# Patient Record
Sex: Male | Born: 1960 | Race: White | Hispanic: No | Marital: Married | Smoking: Former smoker
Health system: Southern US, Community
[De-identification: ages and names within clinical notes are randomized; demographics above are authoritative.]

## PROBLEM LIST (undated history)

## (undated) DIAGNOSIS — J189 Pneumonia, unspecified organism: Secondary | ICD-10-CM

## (undated) DIAGNOSIS — I219 Acute myocardial infarction, unspecified: Secondary | ICD-10-CM

## (undated) DIAGNOSIS — C801 Malignant (primary) neoplasm, unspecified: Secondary | ICD-10-CM

## (undated) DIAGNOSIS — J84112 Idiopathic pulmonary fibrosis: Secondary | ICD-10-CM

## (undated) DIAGNOSIS — I2510 Atherosclerotic heart disease of native coronary artery without angina pectoris: Secondary | ICD-10-CM

## (undated) DIAGNOSIS — I739 Peripheral vascular disease, unspecified: Secondary | ICD-10-CM

## (undated) DIAGNOSIS — M797 Fibromyalgia: Secondary | ICD-10-CM

## (undated) DIAGNOSIS — E119 Type 2 diabetes mellitus without complications: Secondary | ICD-10-CM

## (undated) DIAGNOSIS — R0600 Dyspnea, unspecified: Secondary | ICD-10-CM

## (undated) DIAGNOSIS — I509 Heart failure, unspecified: Secondary | ICD-10-CM

## (undated) DIAGNOSIS — J449 Chronic obstructive pulmonary disease, unspecified: Secondary | ICD-10-CM

## (undated) DIAGNOSIS — Z87442 Personal history of urinary calculi: Secondary | ICD-10-CM

## (undated) HISTORY — PX: OTHER SURGICAL HISTORY: SHX169

## (undated) HISTORY — PX: WOUND DEBRIDEMENT: SHX247

## (undated) HISTORY — PX: CARDIAC CATHETERIZATION: SHX172

## (undated) HISTORY — PX: APPLICATION OF WOUND VAC: SHX5189

---

## 2012-03-19 DIAGNOSIS — E119 Type 2 diabetes mellitus without complications: Secondary | ICD-10-CM | POA: Insufficient documentation

## 2013-05-14 ENCOUNTER — Ambulatory Visit: Payer: Self-pay | Admitting: Vascular Surgery

## 2013-05-14 LAB — BASIC METABOLIC PANEL
BUN: 11 mg/dL (ref 7–18)
Calcium, Total: 8.8 mg/dL (ref 8.5–10.1)
Chloride: 106 mmol/L (ref 98–107)
Co2: 23 mmol/L (ref 21–32)
Creatinine: 0.69 mg/dL (ref 0.60–1.30)
EGFR (African American): >60
Glucose: 221 mg/dL — ABNORMAL HIGH (ref 65–99)
Osmolality: 276 (ref 275–301)
Sodium: 135 mmol/L — ABNORMAL LOW (ref 136–145)

## 2013-07-25 HISTORY — PX: CORONARY ARTERY BYPASS GRAFT: SHX141

## 2013-10-20 ENCOUNTER — Inpatient Hospital Stay: Payer: Self-pay | Admitting: Specialist

## 2013-10-20 LAB — CBC WITH DIFFERENTIAL/PLATELET
BASOS ABS: 0.2 10*3/uL — AB (ref 0.0–0.1)
Basophil %: 1.1 %
EOS ABS: 0.5 10*3/uL (ref 0.0–0.7)
Eosinophil %: 2.4 %
HCT: 45.9 % (ref 40.0–52.0)
HGB: 14.7 g/dL (ref 13.0–18.0)
LYMPHS ABS: 4.7 10*3/uL — AB (ref 1.0–3.6)
Lymphocyte %: 24.8 %
MCH: 29.5 pg (ref 26.0–34.0)
MCHC: 32.1 g/dL (ref 32.0–36.0)
MCV: 92 fL (ref 80–100)
Monocyte #: 1.7 x10 3/mm — ABNORMAL HIGH (ref 0.2–1.0)
Monocyte %: 9 %
NEUTROS PCT: 62.7 %
Neutrophil #: 11.9 10*3/uL — ABNORMAL HIGH (ref 1.4–6.5)
Platelet: 181 10*3/uL (ref 150–440)
RBC: 4.99 10*6/uL (ref 4.40–5.90)
RDW: 14 % (ref 11.5–14.5)
WBC: 19 10*3/uL — ABNORMAL HIGH (ref 3.8–10.6)

## 2013-10-20 LAB — COMPREHENSIVE METABOLIC PANEL
ALT: 27 U/L (ref 12–78)
Albumin: 3.5 g/dL (ref 3.4–5.0)
Alkaline Phosphatase: 52 U/L
Anion Gap: 9 (ref 7–16)
BILIRUBIN TOTAL: 0.5 mg/dL (ref 0.2–1.0)
BUN: 12 mg/dL (ref 7–18)
CHLORIDE: 103 mmol/L (ref 98–107)
CO2: 24 mmol/L (ref 21–32)
Calcium, Total: 8.2 mg/dL — ABNORMAL LOW (ref 8.5–10.1)
Creatinine: 1.09 mg/dL (ref 0.60–1.30)
EGFR (African American): >60
Glucose: 378 mg/dL — ABNORMAL HIGH (ref 65–99)
Osmolality: 287 (ref 275–301)
Potassium: 3.8 mmol/L (ref 3.5–5.1)
SGOT(AST): 19 U/L (ref 15–37)
Sodium: 136 mmol/L (ref 136–145)
Total Protein: 8 g/dL (ref 6.4–8.2)

## 2013-10-20 LAB — TROPONIN I
TROPONIN-I: 0.7 ng/mL — AB
Troponin-I: 0.5 ng/mL — ABNORMAL HIGH
Troponin-I: 0.63 ng/mL — ABNORMAL HIGH

## 2013-10-20 LAB — APTT: ACTIVATED PTT: 28.6 s (ref 23.6–35.9)

## 2013-10-20 LAB — CK TOTAL AND CKMB (NOT AT ARMC)
CK, Total: 118 U/L
CK-MB: 5 ng/mL — ABNORMAL HIGH (ref 0.5–3.6)

## 2013-10-20 LAB — CK-MB
CK-MB: 5.1 ng/mL — ABNORMAL HIGH (ref 0.5–3.6)
CK-MB: 6.5 ng/mL — ABNORMAL HIGH (ref 0.5–3.6)
CK-MB: 6.1 ng/mL — AB (ref 0.5–3.6)

## 2013-10-20 LAB — PRO B NATRIURETIC PEPTIDE: B-Type Natriuretic Peptide: 1536 pg/mL — ABNORMAL HIGH (ref 0–125)

## 2013-10-21 LAB — CBC WITH DIFFERENTIAL/PLATELET
Basophil #: 0 10*3/uL (ref 0.0–0.1)
Basophil %: 0.1 %
Eosinophil #: 0 10*3/uL (ref 0.0–0.7)
Eosinophil %: 0 %
HCT: 38.1 % — AB (ref 40.0–52.0)
HGB: 12.6 g/dL — ABNORMAL LOW (ref 13.0–18.0)
LYMPHS PCT: 6.8 %
Lymphocyte #: 1.1 10*3/uL (ref 1.0–3.6)
MCH: 29.6 pg (ref 26.0–34.0)
MCHC: 33.1 g/dL (ref 32.0–36.0)
MCV: 90 fL (ref 80–100)
MONO ABS: 1 x10 3/mm (ref 0.2–1.0)
Monocyte %: 6.1 %
NEUTROS ABS: 14.7 10*3/uL — AB (ref 1.4–6.5)
NEUTROS PCT: 87 %
PLATELETS: 121 10*3/uL — AB (ref 150–440)
RBC: 4.25 10*6/uL — ABNORMAL LOW (ref 4.40–5.90)
RDW: 13.7 % (ref 11.5–14.5)
WBC: 16.8 10*3/uL — ABNORMAL HIGH (ref 3.8–10.6)

## 2013-10-21 LAB — BASIC METABOLIC PANEL
Anion Gap: 6 — ABNORMAL LOW (ref 7–16)
BUN: 30 mg/dL — ABNORMAL HIGH (ref 7–18)
CO2: 26 mmol/L (ref 21–32)
CREATININE: 1.26 mg/dL (ref 0.60–1.30)
Calcium, Total: 7.8 mg/dL — ABNORMAL LOW (ref 8.5–10.1)
Chloride: 100 mmol/L (ref 98–107)
EGFR (Non-African Amer.): >60
Glucose: 318 mg/dL — ABNORMAL HIGH (ref 65–99)
Osmolality: 283 (ref 275–301)
Potassium: 4.1 mmol/L (ref 3.5–5.1)
Sodium: 132 mmol/L — ABNORMAL LOW (ref 136–145)

## 2013-10-21 LAB — PRO B NATRIURETIC PEPTIDE: B-TYPE NATIURETIC PEPTID: 1923 pg/mL — AB (ref 0–125)

## 2013-10-22 LAB — CBC WITH DIFFERENTIAL/PLATELET
BASOS PCT: 0.1 %
Basophil #: 0 10*3/uL (ref 0.0–0.1)
EOS ABS: 0 10*3/uL (ref 0.0–0.7)
EOS PCT: 0 %
HCT: 38.8 % — AB (ref 40.0–52.0)
HGB: 12.7 g/dL — ABNORMAL LOW (ref 13.0–18.0)
LYMPHS PCT: 7.7 %
Lymphocyte #: 1 10*3/uL (ref 1.0–3.6)
MCH: 29.5 pg (ref 26.0–34.0)
MCHC: 32.7 g/dL (ref 32.0–36.0)
MCV: 90 fL (ref 80–100)
MONO ABS: 0.6 x10 3/mm (ref 0.2–1.0)
MONOS PCT: 4.3 %
Neutrophil #: 11.9 10*3/uL — ABNORMAL HIGH (ref 1.4–6.5)
Neutrophil %: 87.9 %
Platelet: 125 10*3/uL — ABNORMAL LOW (ref 150–440)
RBC: 4.3 10*6/uL — ABNORMAL LOW (ref 4.40–5.90)
RDW: 14 % (ref 11.5–14.5)
WBC: 13.5 10*3/uL — ABNORMAL HIGH (ref 3.8–10.6)

## 2013-10-22 LAB — BASIC METABOLIC PANEL
ANION GAP: 5 — AB (ref 7–16)
BUN: 32 mg/dL — ABNORMAL HIGH (ref 7–18)
CHLORIDE: 101 mmol/L (ref 98–107)
Calcium, Total: 8 mg/dL — ABNORMAL LOW (ref 8.5–10.1)
Co2: 28 mmol/L (ref 21–32)
Creatinine: 1.18 mg/dL (ref 0.60–1.30)
EGFR (African American): >60
EGFR (Non-African Amer.): >60
Glucose: 300 mg/dL — ABNORMAL HIGH (ref 65–99)
Osmolality: 286 (ref 275–301)
Potassium: 4.5 mmol/L (ref 3.5–5.1)
Sodium: 134 mmol/L — ABNORMAL LOW (ref 136–145)

## 2013-10-25 LAB — CULTURE, BLOOD (SINGLE)

## 2013-11-22 DIAGNOSIS — I2510 Atherosclerotic heart disease of native coronary artery without angina pectoris: Secondary | ICD-10-CM | POA: Insufficient documentation

## 2013-12-10 DIAGNOSIS — Z951 Presence of aortocoronary bypass graft: Secondary | ICD-10-CM | POA: Insufficient documentation

## 2013-12-14 DIAGNOSIS — I509 Heart failure, unspecified: Secondary | ICD-10-CM | POA: Insufficient documentation

## 2013-12-14 DIAGNOSIS — J449 Chronic obstructive pulmonary disease, unspecified: Secondary | ICD-10-CM | POA: Insufficient documentation

## 2014-01-31 DIAGNOSIS — E669 Obesity, unspecified: Secondary | ICD-10-CM | POA: Insufficient documentation

## 2014-02-03 DIAGNOSIS — R768 Other specified abnormal immunological findings in serum: Secondary | ICD-10-CM | POA: Insufficient documentation

## 2014-11-14 NOTE — Op Note (Signed)
PATIENT NAME:  [name], [name] MR#:  [id number] DATE OF BIRTH:  July 20, 1961  DATE OF PROCEDURE:  05/14/2013  PREOPERATIVE DIAGNOSIS: Atherosclerotic occlusive disease, bilateral lower extremities, with life-style-limiting claudication.   POSTOPERATIVE DIAGNOSIS: Atherosclerotic occlusive disease, bilateral lower extremities, with life-style-limiting claudication.   PROCEDURE:  1.  Abdominal aortogram.  2.  Right lower extremity runoff.  3.  Percutaneous transluminal angioplasty, to 5 mm, right common femoral artery, using Lutonix balloon.  4.  Percutaneous transluminal angioplasty and stent placement, right common iliac artery, to 7 mm.  5.  Percutaneous transluminal angioplasty and stent placement, left external iliac artery, to 6 mm.   SURGEON: Katha Cabal, MD.  SEDATION: Versed 5 mg plus fentanyl 200 mcg administered IV. Continuous ECG, pulse oximetry and cardiopulmonary monitoring was performed throughout the entire procedure by the interventional radiology nurse.  TOTAL SEDATION TIME: Is 1 hour 20 minutes.   ACCESS: A 6-French sheath, left common femoral artery.   FLUOROSCOPY TIME: Was 9 minutes.   CONTRAST USED: Isovue 134 mL.   INDICATIONS: Mr. 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 He is still working. His mobility has become an increasing problem, and he is seeking treatment. The risks and benefits for angiography with the hope for intervention were reviewed. All questions were answered. The patient has agreed to proceed.   PROCEDURE: The patient was taken to special procedures and placed in the supine position and, after adequate sedation had been achieved, both groins were prepped and draped in sterile fashion. Then 1% lidocaine was infiltrated into soft tissues in the left. Ultrasound was placed in a sterile sleeve. Ultrasound was utilized  secondary to lack of appropriate landmarks and to avoid vascular injury.   Under ultrasound visualization, the common femoral artery is identified. It is echolucent and pulsatile, indicating patency. Images were recorded for the permanent record and access to the common femoral artery was obtained with a micropuncture needle, microwire followed by microsheath, J-wire followed by a 5-French sheath. K-wire will not negotiate through the iliac system and a Magic Torque wire is selected after hand-injected contrast demonstrates a greater than 90% stenosis in the proximal external iliac artery. Magic Torque wire was then negotiated through the lesion and up into the aorta and subsequently 3000 units of heparin is given and allowed to circulate for several minutes. A 5 x 4 balloon is advanced across the lesion and subsequently an inflation is made to 10 atmospheres for several minutes.   Follow-up angiography demonstrates significant residual stenosis but an improvement. Therefore, balloon was removed and the pigtail catheter was advanced over the wire and positioned at the level of T12. AP projection of the aorta was obtained. The pigtail catheter was repositioned and bilateral oblique views of the pelvis are obtained.   Using a stiff-angled glide wire and the pigtail catheter, the aortic bifurcation was crossed and the pigtail catheter was advanced down to the external iliac where RAO projection of the right groin was obtained. Subsequently, the pigtail catheter was advanced into the profunda and distal runoff is imaged down to the trifurcation.   Amplatz Super Stiff wire is then advanced and a Balkan sheath is advanced up and over a 6-French. As noted, 3000 units of heparin had been given.   The Amplatz wire was exchanged for a Magic Torque wire. A 4 x 4 balloon is advanced across the lesion, which is located  within the distal external and short distance of the proximal common femoral. Angioplasty was  performed to 10 atmospheres for 3 minutes. Follow-up angiography demonstrates a smooth angioplasty site; however, there is greater than 50% residual stenosis and therefore a 5 x 60 Lutonix balloon is advanced across the lesion. It is inflated to 14 atmospheres and left inflated for 3 minutes.   Follow-up angiography demonstrates that there is approximately a 15% to 20% residual stenosis, significant improvement, and it is completely smooth and free of any dissections and appears to be more than adequate.   Attention is then turned to the more proximal iliac and an LAO projection in magnified view is  used to demonstrate the iliac stenosis through the Balkan sheath. This lesion is angioplastied first to 6 mm and subsequently an 8 x 60 Lifestar stent is deployed across it and is post dilated to 7 mm. Following the 7 mm dilatation, there is less than 10% residual stenosis.   The sheath is then pulled back into the external iliac on the left. RAO projections obtained. Subsequently a 7 x 60 Lifestar stent is deployed across the lesion in the left external iliac. It is  post dilated with a 6 mm balloon and the result is excellent.   Oblique view of the left groin was obtained. Subsequently, a Mynx device is deployed with good result. There are no immediate complications.   INTERPRETATION: The abdominal aorta is opacified with a bolus injection of contrast. There are single renal arteries, no evidence for renal artery stenosis, and the nephrograms are normal size. Distally within the aorta there is diffuse disease but it does not appear to be hemodynamically significant.   Within the right common iliac artery, there is greater than 70% stenosis. Within the right distal external and proximal common femoral, there is a greater than 80% stenosis. SFA has diffuse disease distally, profunda is widely patent.   The left common iliac is patent but the left external iliac demonstrated a string sign initially.  Following angioplasty there was an inadequate result. There is diffuse 50% stenosis in the left common femoral. Profunda femoris appears patent. SFA appears occluded.   Following angioplasty to a maximum dimension of 5 mm with the Lutonix balloon, the common femoral, external iliac lesion on the right is well treated following angioplasty and stent placement with a maximal dilatation to 7 mm. The right common iliac artery lesion is well treated and, following angioplasty and stent placement with maximal dilatation to 6 mm, the left external iliac artery is well treated.   SUMMARY: Successful treatment of aortoiliac disease as described above.   ____________________________ Katha Cabal, MD ggs:np D: 05/14/2013 13:57:21 ET T: 05/14/2013 14:48:43 ET JOB#: [id number]  cc: Katha Cabal, MD, <Dictator> Dory Horn. Eliberto Ivory, Eugene MD ELECTRONICALLY SIGNED 05/31/2013 8:14

## 2014-11-15 NOTE — Consult Note (Signed)
   Present Illness 54 yo male with history of pvd s/p pci of iliacs and carotids who presented to the hosptal with increasing shortness of breath and chest tightness. He has ruled in for a nstemi with a serum troponin of 0.67. He has risk facotrs including tobacco abuse, diaabetes, hyperlipidemia, hypertension and family history of cad. He still has some shortness of breath and chest pain. Echo was very difficult to assess due to very poor sound wave transfmission. EF doe not appear to be normal but at least mildly reduced at 40-45%.   Physical Exam:  GEN well developed, obese   HEENT PERRL, hearing intact to voice   NECK supple  No masses   RESP no use of accessory muscles  rhonchi  crackles   CARD Regular rate and rhythm  Normal, S1, S2  Murmur   Murmur Systolic   Systolic Murmur axilla   ABD denies tenderness  no hernia  normal BS  no Adominal Mass   EXTR negative cyanosis/clubbing, negative edema   SKIN normal to palpation   NEURO cranial nerves intact, motor/sensory function intact   PSYCH A+O to time, place, person   Review of Systems:  Subjective/Chief Complaint shortness of breath   General: No Complaints   Skin: No Complaints   ENT: No Complaints   Eyes: No Complaints   Neck: No Complaints   Respiratory: Short of breath  Wheezing   Cardiovascular: Chest pain or discomfort  Dyspnea  Orthopnea   Gastrointestinal: No Complaints   Genitourinary: No Complaints   Vascular: No Complaints   Musculoskeletal: No Complaints   Neurologic: No Complaints   Hematologic: No Complaints   Endocrine: No Complaints   Psychiatric: No Complaints   Review of Systems: All other systems were reviewed and found to be negative   Medications/Allergies Reviewed Medications/Allergies reviewed   EKG:  EKG NSR   Abnormal NSSTTW changes    No Known Allergies:    Impression 54 yo male with history of cad s/p stent placement in the iliacs and carotids. Presented with  chest pain and sob. EKG unremarkable for signficant ischemia. Ruled in for nstemi wiht troponin elevation to 0.68. He is stable. Risk facotrs include diabetes melitus, hypertension and hyperlipidemia. Echo was difficult to assess due to poor images but appears to be mildly reduced. Given his severe vascular disease and multiple risk factors, will need to assess for evidence of cad with cardiac cath due to rest pain and elevated cardiac markers. Risk and benefits were discussed with patient and multiple family members. He agrees to proceed. Will discuss access approach with vascular surgery prior to cath   Plan 1. Continue to rule out for mi. 2. Continue current meds 3. Proceed with cardiac cath in am 4. Further recs pending course   Electronic Signatures: Teodoro Spray (MD)  (Signed 29-Mar-15 15:17)  Authored: General Aspect/Present Illness, History and Physical Exam, Review of System, EKG , Allergies, Impression/Plan   Last Updated: 29-Mar-15 15:17 by Teodoro Spray (MD)

## 2014-11-15 NOTE — H&P (Signed)
PATIENT NAME:  [name], [name] MR#:  [id number] DATE OF BIRTH:  April 23, 1961  DATE OF ADMISSION:  10/20/2013  REFERRING PHYSICIAN:  Dr. Lurline Hare.   PRIMARY CARE PHYSICIAN:  Dr. Calla Kicks.   CHIEF COMPLAINT:  Shortness of breath.   HISTORY OF PRESENT ILLNESS:  This is a 54 year old male with known history of tobacco abuse, peripheral vascular disease, diabetes mellitus, hyperlipidemia, presents with complaints of shortness of breath, the patient reports in general he has had shortness of breath for a few months, but reported has much worsened over the last couple of days, as well he reports some cough, denies any fever, any chills, the patient called EMS today, when they presented he was saturating in the 70s on room air, the patient requiring BiPAP in the ED, the patient's chest x-ray showed COPD picture, as well shows opacity, possible pulmonary edema versus pneumonia, the patient had leukocytosis at 19,000, he is afebrile, the patient's symptoms improved after receiving IV Lasix and IV Solu-Medrol and nebulizer treatment, as he had significant wheezing, on presentation, he denies any history of COPD, but he reports history of heavy smoking in the past, was never officially worked up for COPD, the patient's BNP was found to be elevated at 1500, the patient's troponin were found to be elevated at 0.5, he denies any chest pain, the patient was given aspirin and given one dose Lovenox in the ED until acute coronary syndrome is ruled out, the patient is known to have history of peripheral vascular disease, status post stenting by Dr. Delana Meyer.  The patient denies any coffee-ground emesis, any bright red blood per rectum or any melena, any nausea, any vomiting, any chest pain.   PAST MEDICAL HISTORY: 1.  Diabetes mellitus.  2.  Peripheral vascular disease.  3.  Hyperlipidemia.  4.  Peripheral vascular disease.  5.  Tobacco abuse.   PAST SURGICAL HISTORY:  Status post stenting of lower extremity due to  peripheral vascular disease in the past.   SOCIAL HISTORY:  The patient smokes up to 2 packs per day.  No alcohol.  No illicit drug use.   FAMILY HISTORY:  Significant for MI in his mother at the age of 37.   ALLERGIES:  No known drug allergies.   HOME MEDICATIONS: 1.  Aspirin 81 mg oral daily.  2.  Gabapentin 300 mg oral at bedtime.  3.  Glipizide 10 mg oral daily.  4.  Humulin 70/30 25 to 35 units twice daily.  5.  Metformin 1 gram oral 2 times a day.  6.  Pravastatin 40 mg at bedtime.  7.  Plavix 75 mg daily.  8.  Albuterol as needed.   REVIEW OF SYSTEMS:  CONSTITUTIONAL:  Denies fever, chills.  Complains of fatigue, weakness.  Denies weight gain, weight loss.  EYES:  Denies blurry vision, double vision, inflammation, glaucoma.  EARS, NOSE, THROAT:  Denies tinnitus, ear pain, hearing loss, epistaxis.  RESPIRATORY:  Reports cough, productive sputum, shortness of breath.  Denies any hemoptysis.  CARDIOVASCULAR:  Denies chest pain, syncope.  Reports some palpitations.  Has chronic lower extremity edema.  GASTROINTESTINAL:  Denies nausea, vomiting, diarrhea, abdominal pain, hematemesis, melena.  GENITOURINARY:  Denies dysuria, hematuria, renal colic.  ENDOCRINE:  Denies polyuria, polydipsia, heat or cold intolerance.  HEMATOLOGY:  Denies anemia, easy bruising, bleeding diathesis.  INTEGUMENTARY:  Denies acne, rash or skin lesions.  MUSCULOSKELETAL:  Denies any arthritis, cramps or gout.  NEUROLOGIC:  Denies CVA, TIA, tremors, vertigo.  PSYCHIATRIC:  Denies anxiety, insomnia or depression, substance abuse, alcohol abuse.   PHYSICAL EXAMINATION: VITAL SIGNS:  Temperature 96.8, pulse 104, respiratory rate 19, blood pressure 138/68, saturating 97% on BiPAP.  GENERAL:  Obese male, lies in mild respiratory distress on BiPAP.  HEENT:  Head atraumatic, normocephalic.  Pupils equal, reactive to light.  Pink conjunctivae.  Anicteric sclerae.  Moist oral mucosa.  NECK:  Supple.  No  thyromegaly.  No JVD.  CHEST:  The patient had fair air entry bilaterally with basilar crackles and scattered wheezing.  CARDIOVASCULAR:  S1, S2 heard.  No rubs, murmur or gallops.  ABDOMEN:  Soft, nontender, nondistended.  Bowel sounds present.  EXTREMITIES:  No edema.  No clubbing.  No cyanosis.  Pedal pulses were present, but diminished, radial pulses symmetrical bilaterally.  PSYCHIATRIC:  Appropriate affect.  Awake, alert x 3.  Intact judgment and insight.  NEUROLOGIC:  Cranial nerves grossly intact.  Motor five out of five.  No focal deficits.  SKIN:  Warm and dry.  No focal deficits.  MUSCULOSKELETAL:  No joint effusion or erythema.  LYMPHATIC:  No cervical lymphadenopathy.   PERTINENT LABORATORY DATA:  BNP 1536, glucose 378, BUN 12, creatinine 1.09, sodium 136, potassium 3.8, chloride 103, ALT 27, AST 19, alk phos 52, total CK 118.  Troponin 0.50, white blood cell 19, hemoglobin 14.7, hematocrit 45.9, platelets 181.  ABG showing pH of 7.24, pCO2 51, pO2 of 416.  Chest x-ray showing COPD picture and interstitial opacities which could be congestive or infectious.  ASSESSMENT AND PLAN: 1.  Acute respiratory failure, this appears to be multifactorial, most likely related to chronic obstructive pulmonary exacerbation, and congestive heart failure, of new onset, and possible pneumonia on the chest x-ray, the patient currently on BiPAP, but I would anticipate he will be off BiPAP very soon, we will keep him on as needed oxygen.  2.  Chronic obstructive pulmonary disease exacerbation, the patient has no official diagnosis, but apparently has chronic obstructive pulmonary disease as it is even evident on his chest x-ray, the patient will be started on intravenous Solu-Medrol and DuoNebs every four hours and as needed nebulizer.  As well, we will check echocardiogram for the patient, we will continue to cycle the cardiac enzymes. 3.  Pneumonia:  The patient has significant leukocytosis with opacity on  the chest x-ray, will be started on levofloxacin.  4.  Elevated troponin:  This is most likely demand ischemia, but he will be given 324 mg of aspirin.  He will be given one dose of Lovenox treatment dose until we cycle his cardiac enzymes and rule out acute coronary syndrome, we will consult cardiology service.  We will check 2-D echocardiogram as well.  5.  Peripheral vascular disease.  We will continue the patient on aspirin and Plavix.  6.  Hyperlipidemia.  Continue with statin.  7.  Diabetes mellitus, uncontrolled:  We will hold hypoglycemic agents.  We will keep patient on insulin sliding scale.  8.  Tobacco abuse:  The patient was counseled.  Will be started on NicoDerm patch.  9.  Deep vein thrombosis prophylaxis.  The patient received one dose Lovenox for anticoagulation.  We will await cardiology recommendation prior to decide on to continue treatment versus prophylaxis dose.  10.  CODE STATUS:  DISCUSSED WITH THE PATIENT, CURRENTLY HE WISHES TO BE FULL CODE, BUT HE DOES NOT WISH TO REMAIN ON VENT SUPPORT FOR MORE THAN TWO DAYS IF HE REQUIRES INTUBATION.   Total time spent on admission  and patient care 55 minutes.     ____________________________ Albertine Patricia, MD dse:ea D: 10/20/2013 05:22:52 ET T: 10/20/2013 06:04:15 ET JOB#: [id number]  cc: Albertine Patricia, MD, <Dictator> Adelis Docter Graciela Husbands MD ELECTRONICALLY SIGNED 10/21/2013 0:10

## 2014-11-15 NOTE — Discharge Summary (Signed)
PATIENT NAME:  [name], WEBER MR#:  [id number] DATE OF BIRTH:  [birth year]  DATE OF ADMISSION:  10/20/2013 DATE OF DISCHARGE:  10/22/2013  For detailed note, please take a look at the history and physical done on admission by Dr. Waldron Labs.   DIAGNOSES AT DISCHARGE:  As follows:  Acute respiratory failure, likely secondary to a combination of chronic obstructive pulmonary disease and congestive heart failure. Chronic obstructive pulmonary disease exacerbation due to ongoing tobacco abuse. Congestive heart failure, likely acute systolic dysfunction. Cardiomyopathy, ejection fraction of 20%, likely ischemic cardiomyopathy. Diabetes, peripheral vascular disease, hypertension, hyperlipidemia, ongoing tobacco abuse.   DIET:  The patient is being discharged on a low-sodium, low-fat, American diabetic Association diet.   ACTIVITY: As tolerated.   FOLLOWUP: With Dr. Jordan Hawks next 1 to 2 weeks.   DISCHARGE MEDICATIONS: Gabapentin 300 mg at bedtime, metformin 1000 mg b.i.d., glipizide XL 10 mg daily, Pravachol 40 mg daily, Humulin 70/30, 25 units b.i.d., aspirin 81 mg daily, albuterol inhaler 2 puffs 4 times daily as needed, Advair 250/50 one puff b.i.d., Spiriva 1 puff daily, Lasix 40 mg daily, Levaquin 5 mg daily x 5 days, prednisone taper starting at 60 mg down to 10 mg over the next 6 days, lisinopril 5 mg daily, Coreg 3.125 mg b.i.d. and potassium 10 mEq daily.   CONSULTANTS DURING HOSPITAL COURSE: Dr. Jordan Hawks from cardiology.   PERTINENT STUDIES DONE DURING THE HOSPITAL COURSE: As follows: Chest x-ray done on admission showing COPD with interstitial opacities that could be congestive or infectious. A 2-dimensional echocardiogram done showing ejection fraction of 35% to 40%, moderately decreased global LV systolic function, mildly dilated left atrium. A cardiac catheterization done showing moderate to severe left-sided failure, EF of 20%, significant 3-vessel coronary artery disease.   HOSPITAL  COURSE: This is a 54 year old male with medical problems as mentioned above, presented to the hospital with shortness of breath and noted to be in acute respiratory failure.  1.  Acute respiratory failure. This was likely the diagnosis at admission as the patient presented with shortness of breath, tachycardia, using his accessory muscles on breathing on admission. This was initially thought to be secondary to COPD along with some mild CHF.  Therefore, the patient was treated for both. He was started on IV diuresis for his CHF and also started on IV steroids, around-the-clock nebulizer treatments and empiric antibiotics for his COPD exacerbation. The patient has clinically improved significantly since admission. He is about 3 liters negative after diuresis. He has also been tapered off IV steroids and onto oral prednisone and his wheezing and bronchospasm has improved. He has been ambulated on room air, did not desaturate below 90% and, therefore, does not qualify for home oxygen. At this point, the patient is being discharged on oral prednisone taper and maintenance inhalers for his COPD and also some maintenance Lasix for his underlying CHF.   2.  COPD exacerbation. This was likely secondary to ongoing tobacco abuse and possible underlying acute bronchitis. The patient was aggressively treated with IV steroids, around-the-clock nebulizer treatments, also maintained on empiric antibiotics with Levaquin. I also did start him on maintenance Advair and Spiriva, which he is currently being discharged on too. His bronchospasm and his wheezing improved. He is being discharged on oral prednisone taper along with his maintenance inhalers. He did not qualify for home oxygen.  3.  CHF.  This is likely acute systolic CHF.  The patient had an echocardiogram that showed EF of 35% to 40%, also  had a cardiac cath which showed an EF of 20%. Clinically, he is not in CHF presently. He has been diuresed adequately with IV Lasix.  He is currently being discharged on oral Lasix, ACE inhibitor and beta blockers as stated. He will have close followup with cardiology as an outpatient.  4.  Elevated troponin. This was likely in the setting of demand ischemia. There was no evidence of acute coronary syndrome. The patient had no acute chest pain.  His cardiac markers were followed serially, they did not trend up. His echocardiogram showed severe LV dysfunction. Therefore, he underwent a cardiac catheterization which did show a severe ischemic cardiomyopathy, ejection fraction of 20% along with significant 3-vessel coronary artery disease. At this point, cardiology wants to manage the patient medically and repeat his echocardiogram in a few weeks. Otherwise, refer him to possible coronary artery bypass graft surgery as an outpatient.  5.  Diabetes. The patient's blood sugars remained stable. He will continue his metformin, Humulin 70/30 as an outpatient.  6.  Hyperlipidemia. The patient was maintained on his Pravachol and will resume that.  7.  Diabetic neuropathy. The patient was maintained on his Neurontin. He will continue that.  8.  Tobacco abuse. The patient was strongly advised to quit smoking. He was maintained on nicotine patch while in the hospital.  9.  Ischemic cardiomyopathy, ejection fraction of 20%. This was as per the cardiac catheterization done on March 30th. At this point, cardiology wants to manage this medically with beta blockers, ACE inhibitors and continue his aspirin and statin. He will have close followup with cardiology as an outpatient. He may need referral for possible coronary artery bypass graft surgery but this is to be done by Dr. Ubaldo Glassing, his cardiologist, as an outpatient.  10.  CODE STATUS: The patient is a full code.   DISPOSITION: He is being discharged home.   TIME SPENT: 45 minutes.   ____________________________ Belia Heman. Verdell Carmine, MD vjs:cs D: 10/22/2013 15:12:35 ET T: 10/22/2013 19:53:57  ET JOB#: [id number]  cc: Belia Heman. Verdell Carmine, MD, <Dictator> Javier Docker. Ubaldo Glassing, MD Henreitta Leber MD ELECTRONICALLY SIGNED 10/23/2013 14:52

## 2015-01-02 ENCOUNTER — Other Ambulatory Visit: Payer: Self-pay | Admitting: Specialist

## 2015-01-02 DIAGNOSIS — R918 Other nonspecific abnormal finding of lung field: Secondary | ICD-10-CM

## 2015-03-30 ENCOUNTER — Encounter: Payer: Self-pay | Admitting: *Deleted

## 2015-03-30 ENCOUNTER — Emergency Department: Payer: No Typology Code available for payment source

## 2015-03-30 DIAGNOSIS — Y998 Other external cause status: Secondary | ICD-10-CM | POA: Diagnosis not present

## 2015-03-30 DIAGNOSIS — W312XXA Contact with powered woodworking and forming machines, initial encounter: Secondary | ICD-10-CM | POA: Diagnosis not present

## 2015-03-30 DIAGNOSIS — Y9389 Activity, other specified: Secondary | ICD-10-CM | POA: Insufficient documentation

## 2015-03-30 DIAGNOSIS — E119 Type 2 diabetes mellitus without complications: Secondary | ICD-10-CM | POA: Diagnosis not present

## 2015-03-30 DIAGNOSIS — S81811A Laceration without foreign body, right lower leg, initial encounter: Secondary | ICD-10-CM | POA: Diagnosis not present

## 2015-03-30 DIAGNOSIS — Y92002 Bathroom of unspecified non-institutional (private) residence single-family (private) house as the place of occurrence of the external cause: Secondary | ICD-10-CM | POA: Diagnosis not present

## 2015-03-30 NOTE — ED Notes (Signed)
Patient states he was remodeling a bathroom and tripped over a saw. Patient has laceration to right lower leg. Dressing is bloody at site.

## 2015-03-31 ENCOUNTER — Emergency Department
Admission: EM | Admit: 2015-03-31 | Discharge: 2015-03-31 | Discharge status: Against Medical Advice | Payer: No Typology Code available for payment source | Attending: Emergency Medicine | Admitting: Emergency Medicine

## 2015-03-31 HISTORY — DX: Type 2 diabetes mellitus without complications: E11.9

## 2015-03-31 HISTORY — DX: Acute myocardial infarction, unspecified: I21.9

## 2015-05-12 ENCOUNTER — Encounter: Payer: No Typology Code available for payment source | Attending: Surgery | Admitting: Surgery

## 2015-05-12 DIAGNOSIS — E11622 Type 2 diabetes mellitus with other skin ulcer: Secondary | ICD-10-CM | POA: Insufficient documentation

## 2015-05-12 DIAGNOSIS — I70532 Atherosclerosis of nonautologous biological bypass graft(s) of the right leg with ulceration of calf: Secondary | ICD-10-CM | POA: Insufficient documentation

## 2015-05-12 DIAGNOSIS — J449 Chronic obstructive pulmonary disease, unspecified: Secondary | ICD-10-CM | POA: Diagnosis not present

## 2015-05-12 DIAGNOSIS — I739 Peripheral vascular disease, unspecified: Secondary | ICD-10-CM | POA: Insufficient documentation

## 2015-05-12 DIAGNOSIS — I509 Heart failure, unspecified: Secondary | ICD-10-CM | POA: Diagnosis not present

## 2015-05-12 DIAGNOSIS — Z87891 Personal history of nicotine dependence: Secondary | ICD-10-CM | POA: Diagnosis not present

## 2015-05-12 DIAGNOSIS — I2510 Atherosclerotic heart disease of native coronary artery without angina pectoris: Secondary | ICD-10-CM | POA: Insufficient documentation

## 2015-05-12 DIAGNOSIS — X58XXXA Exposure to other specified factors, initial encounter: Secondary | ICD-10-CM | POA: Diagnosis not present

## 2015-05-12 DIAGNOSIS — S81811A Laceration without foreign body, right lower leg, initial encounter: Secondary | ICD-10-CM | POA: Insufficient documentation

## 2015-05-13 NOTE — Progress Notes (Signed)
John, [name] ([id number]) Visit Report for 05/12/2015 Allergy List Details Patient Name: [name], [name]. Date of Service: 05/12/2015 2:00 PM Medical Record Number: [id number] Patient Account Number: [id number] Date of Birth/Sex: 1961/01/14 (54 y.o. Male) Treating RN: [name] Primary Care Physician: [name] Other Clinician: Referring Physician: Rutherford [name] Treating Physician/Extender: [name] in Treatment: 0 Allergies Active Allergies No Known Drug Allergies Allergy Notes Electronic Signature(s) Signed: 05/12/2015 5:34:28 PM By: [name] Entered By: John Cool, RN, [name], John on 05/12/2015 14:17:42 [name] ([id number]) -------------------------------------------------------------------------------- Arrival Information Details Patient Name: [name] Date of Service: 05/12/2015 2:00 PM Medical Record Number: [id number] Patient Account Number: [id number] Date of Birth/Sex: 12/04/1960 (54 y.o. Male) Treating RN: [name] Primary Care Physician: [name] Other Clinician: Referring Physician: Rutherford [name] Treating Physician/Extender: [name] in Treatment: 0 Visit Information Patient Arrived: Ambulatory Arrival Time: 14:08 Accompanied By: self Transfer Assistance: Manual Patient Identification Verified: Yes Secondary Verification Process Yes Completed: Patient Requires Transmission- No Based Precautions: Patient Has Alerts: Yes Patient Alerts: Patient on Blood Thinner Type II Diabetic Electronic Signature(s) Signed: 05/12/2015 5:34:28 PM By: [name] Entered By: John Cool, RN, [name], John on 05/12/2015 14:12:16 [name] ([id number]) -------------------------------------------------------------------------------- Clinic Level of Care Assessment Details Patient Name: [name]. Date of Service: 05/12/2015 2:00 PM Medical Record Number: [id number] Patient  Account Number: [id number] Date of Birth/Sex: February 22, 1961 (54 y.o. Male) Treating RN: [name] Primary Care Physician: [name] Other Clinician: Referring Physician: Rutherford [name] Treating Physician/Extender: [name] in Treatment: 0 Clinic Level of Care Assessment Items TOOL 1 Quantity Score []  - Use when EandM and Procedure is performed on INITIAL visit 0 ASSESSMENTS - Nursing Assessment / Reassessment X - General Physical Exam (combine w/ comprehensive assessment (listed just 1 20 below) when performed on new pt. evals) X - Comprehensive Assessment (HX, ROS, Risk Assessments, Wounds Hx, etc.) 1 25 ASSESSMENTS - Wound and Skin Assessment / Reassessment []  - Dermatologic / Skin Assessment (not related to wound area) 0 ASSESSMENTS - Ostomy and/or Continence Assessment and Care []  - Incontinence Assessment and Management 0 []  - Ostomy Care Assessment and Management (repouching, etc.) 0 PROCESS - Coordination of Care X - Simple Patient / Family Education for ongoing care 1 15 []  - Complex (extensive) Patient / Family Education for ongoing care 0 []  - Staff obtains Programmer, systems, Records, Test Results / Process Orders 0 []  - Staff telephones HHA, Nursing Homes / Clarify orders / etc 0 []  - Routine Transfer to another Facility (non-emergent condition) 0 []  - Routine Hospital Admission (non-emergent condition) 0 X - New Admissions / Biomedical engineer / Ordering NPWT, Apligraf, etc. 1 15 []  - Emergency Hospital Admission (emergent condition) 0 PROCESS - Special Needs []  - Pediatric / Minor Patient Management 0 []  - Isolation Patient Management 0 [name] ([id number]) []  - Hearing / Language / Visual special needs 0 []  - Assessment of Community assistance (transportation, D/C planning, etc.) 0 []  - Additional assistance / Altered mentation 0 []  - Support Surface(s) Assessment (bed, cushion, seat, etc.) 0 INTERVENTIONS - Miscellaneous []  - External ear  exam 0 []  - Patient Transfer (multiple staff / Civil Service fast streamer / Similar devices) 0 []  - Simple Staple / Suture removal (25 or less) 0 []  - Complex Staple / Suture removal (26 or more) 0 []  - Hypo/Hyperglycemic Management (do not check if billed separately) 0 X - Ankle / Brachial Index (ABI) - do  not check if billed separately 1 15 Has the patient been seen at the hospital within the last three years: Yes Total Score: 90 Level Of Care: New/Established - Level 3 Electronic Signature(s) Signed: 05/12/2015 5:34:28 PM By: [name] Entered By: John Cool, RN, [name], John on 05/12/2015 15:20:33 [name] ([id number]) -------------------------------------------------------------------------------- Encounter Discharge Information Details Patient Name: [name]. Date of Service: 05/12/2015 2:00 PM Medical Record Number: [id number] Patient Account Number: [id number] Date of Birth/Sex: 05-25-1961 (54 y.o. Male) Treating RN: [name] Primary Care Physician: [name] Other Clinician: Referring Physician: Rutherford [name] Treating Physician/Extender: [name] in Treatment: 0 Encounter Discharge Information Items Discharge Pain Level: 0 Discharge Condition: Stable Ambulatory Status: Ambulatory Discharge Destination: Home Transportation: Private Auto Accompanied By: self Schedule Follow-up Appointment: Yes Medication Reconciliation completed and provided to Patient/Care Yes [name]: Provided on Clinical Summary of Care: 05/12/2015 Form Type Recipient Paper Patient [employer] Electronic Signature(s) Signed: 05/12/2015 3:33:31 PM By: [name] Entered By: [name] on 05/12/2015 15:33:31 Krizan, [name] ([id number]) -------------------------------------------------------------------------------- Lower Extremity Assessment Details Patient Name: [name]. Date of Service: 05/12/2015 2:00 PM Medical Record Number: [id number] Patient  Account Number: [id number] Date of Birth/Sex: 08/23/1960 (54 y.o. Male) Treating RN: [name] Primary Care Physician: [name] Other Clinician: Referring Physician: Rutherford [name] Treating Physician/Extender: [name] in Treatment: 0 Edema Assessment Assessed: Shirlyn Goltz: Yes] [Right: Yes] Edema: [Left: No] [Right: No] Vascular Assessment Pulses: Posterior Tibial Palpable: [Left:No] [Right:No] Doppler: [Left:Monophasic] [Right:Monophasic] Dorsalis Pedis Palpable: [Left:Yes] [Right:Yes] Doppler: [Left:Monophasic] [Right:Monophasic] Extremity colors, hair growth, and conditions: Extremity Color: [Left:Pale] [Right:Pale] Hair Growth on Extremity: [Left:Yes] [Right:Yes] Temperature of Extremity: [Left:Warm] [Right:Warm] Capillary Refill: [Left:< 3 seconds] [Right:< 3 seconds] Blood Pressure: Brachial: [Left:130] [Right:135] Toe [name] Assessment Left: Right: Thick: No No Discolored: No No Deformed: No No Improper Length and Hygiene: No No Electronic Signature(s) Signed: 05/12/2015 5:34:28 PM By: [name] Entered By: John Cool, RN, [name], John on 05/12/2015 14:43:17 [place], [name] ([id number]) -------------------------------------------------------------------------------- Multi Wound Chart Details Patient Name: [name]. Date of Service: 05/12/2015 2:00 PM Medical Record Number: [id number] Patient Account Number: [id number] Date of Birth/Sex: 02/19/1961 (54 y.o. Male) Treating RN: [name] Primary Care Physician: [name] Other Clinician: Referring Physician: Rutherford [name] Treating Physician/Extender: [name] in Treatment: 0 Vital Signs Height(in): 67 Pulse(bpm): 89 Weight(lbs): 248 Blood Pressure 135/72 (mmHg): Body Mass Index(BMI): 39 Temperature(F): 97.8 Respiratory Rate 18 (breaths/min): Photos: [1:No Photos] [N/A:N/A] Wound Location: [1:Right Lower Leg - Medial] [N/A:N/A] Wounding Event:  [1:Trauma] [N/A:N/A] Primary Etiology: [1:Diabetic Wound/Ulcer of the Lower Extremity] [N/A:N/A] Comorbid History: [1:Chronic Obstructive Pulmonary Disease (COPD), Congestive Heart Failure, Coronary Artery Disease, Myocardial Infarction, Peripheral Arterial Disease, Peripheral Venous Disease, Type II Diabetes, Neuropathy] [N/A:N/A] Date Acquired: [1:03/30/2015] [N/A:N/A] Weeks of Treatment: [1:0] [N/A:N/A] Wound Status: [1:Open] [N/A:N/A] Measurements L x W x D 8x1.2x0.4 [N/A:N/A] (cm) Area (cm) : [1:7.54] [N/A:N/A] Volume (cm) : [1:3.016] [N/A:N/A] % Reduction in Area: [1:0.00%] [N/A:N/A] % Reduction in Volume: 0.00% [N/A:N/A] Classification: [1:Grade 2] [N/A:N/A] Exudate Amount: [1:Medium] [N/A:N/A] Exudate Type: [1:Serous] [N/A:N/A] Exudate Color: [1:amber] [N/A:N/A] Wound Margin: [1:Flat and Intact] [N/A:N/A] Granulation Amount: [1:Small (1-33%)] [N/A:N/A] Granulation Quality: Pink N/A N/A Necrotic Amount: Medium (34-66%) N/A N/A Exposed Structures: Fascia: No N/A N/A Fat: No Tendon: No Muscle: No Joint: No Bone: No Limited to Skin Breakdown Periwound Skin Texture: Scarring: Yes N/A N/A Edema: No Excoriation: No Induration: No Callus: No Crepitus: No Fluctuance: No Friable: No Rash: No Periwound  Skin Moist: Yes N/A N/A Moisture: Maceration: No Dry/Scaly: No Periwound Skin Color: Atrophie Blanche: No N/A N/A Cyanosis: No Ecchymosis: No Erythema: No Hemosiderin Staining: No Mottled: No Pallor: No Rubor: No Temperature: No Abnormality N/A N/A Tenderness on Yes N/A N/A Palpation: Wound Preparation: Ulcer Cleansing: N/A N/A Rinsed/Irrigated with Saline Topical Anesthetic Applied: Other: lidocaine 4% Treatment Notes Electronic Signature(s) Signed: 05/12/2015 5:34:28 PM By: [name] Entered By: John Cool, RN, [name], John on 05/12/2015 14:49:48 [place], [name]  ([id number]) -------------------------------------------------------------------------------- [place] Details Patient Name: [name], [name]. Date of Service: 05/12/2015 2:00 PM Medical Record Number: [id number] Patient Account Number: [id number] Date of Birth/Sex: Jun 16, 1961 (54 y.o. Male) Treating RN: [name] Primary Care Physician: [name] Other Clinician: Referring Physician: Rutherford [name] Treating Physician/Extender: [name] in Treatment: 0 Active Inactive Abuse / Safety / Falls / [place] Management Nursing Diagnoses: Potential for falls Self care deficit: actual or potential Goals: Patient will remain injury free Date Initiated: 05/12/2015 Goal Status: Active Interventions: Assess fall risk on admission and as needed Notes: Orientation to the Wound Care Program Nursing Diagnoses: Knowledge deficit related to the wound healing center program Goals: Patient/caregiver will verbalize understanding of the [place] Program Date Initiated: 05/12/2015 Goal Status: Active Interventions: Provide education on orientation to the wound center Notes: Soft Tissue Infection Nursing Diagnoses: Potential for infection: soft tissue Goals: Patient will remain free of wound infection [name], [name] ([id number]) Date Initiated: 05/12/2015 Goal Status: Active Signs and symptoms of infection will be recognized early to allow for prompt treatment Date Initiated: 05/12/2015 Goal Status: Active Interventions: Assess signs and symptoms of infection every visit Treatment Activities: Systemic antibiotics : 05/12/2015 Notes: Wound/Skin Impairment Nursing Diagnoses: Impaired tissue integrity Goals: Ulcer/skin breakdown will heal within 14 weeks Date Initiated: 05/12/2015 Goal Status: Active Interventions: Assess ulceration(s) every visit Treatment Activities: Topical wound management initiated :  05/12/2015 Notes: Electronic Signature(s) Signed: 05/12/2015 5:34:28 PM By: [name] Entered By: John Cool, RN, [name], John on 05/12/2015 14:48:56 Cardy, [name] ([id number]) -------------------------------------------------------------------------------- Pain Assessment Details Patient Name: [name]. Date of Service: 05/12/2015 2:00 PM Medical Record Number: [id number] Patient Account Number: [id number] Date of Birth/Sex: 26-Sep-1960 (54 y.o. Male) Treating RN: [name] Primary Care Physician: [name] Other Clinician: Referring Physician: Rutherford [name] Treating Physician/Extender: [name] in Treatment: 0 Active Problems Location of Pain Severity and Description of Pain Patient Has Paino Yes Site Locations Pain Location: Generalized Pain Duration of the Pain. Constant / Intermittento Constant Rate the pain. Current Pain Level: 4 Worst Pain Level: 8 Least Pain Level: 4 Character of Pain Describe the Pain: Aching Pain Management and Medication Current Pain Management: Notes patient described pain: "bone feels like ruffle potato chip" Electronic Signature(s) Signed: 05/12/2015 5:34:28 PM By: [name] Entered By: John Cool, RN, [name], John on 05/12/2015 14:14:23 Tissue, [name] ([id number]) -------------------------------------------------------------------------------- Patient/Caregiver Education Details Patient Name: [name]. Date of Service: 05/12/2015 2:00 PM Medical Record Number: [id number] Patient Account Number: [id number] Date of Birth/Gender: Jun 11, 1961 (54 y.o. Male) Treating RN: [name] Primary Care Physician: [name] Other Clinician: Referring Physician: Rutherford [name] Treating Physician/Extender: [name] in Treatment: 0 Education Assessment Education Provided To: Patient Education Topics Provided Wound/Skin Impairment: Handouts: Caring for Your Ulcer, Other:  wound cafre as prescribed Methods: Demonstration, Explain/Verbal Responses: State content correctly Electronic Signature(s) Signed: 05/12/2015 5:34:28 PM By: [name] Entered By: John Cool,  RN, [name], John on 05/12/2015 15:22:03 Hillary, Schwegler [name] ([id number]) -------------------------------------------------------------------------------- Wound Assessment Details Patient Name: [name], [name]. Date of Service: 05/12/2015 2:00 PM Medical Record Number: [id number] Patient Account Number: [id number] Date of Birth/Sex: 30-Apr-1961 (54 y.o. Male) Treating RN: [name] Primary Care Physician: [name] Other Clinician: Referring Physician: Rutherford [name] Treating Physician/Extender: [name] in Treatment: 0 Wound Status Wound Number: 1 Primary Diabetic Wound/Ulcer of the Lower Etiology: Extremity Wound Location: Right Lower Leg - Medial Wound Open Wounding Event: Trauma Status: Date Acquired: 03/30/2015 Comorbid Chronic Obstructive Pulmonary Disease Weeks Of Treatment: 0 History: (COPD), Congestive Heart Failure, Clustered Wound: No Coronary Artery Disease, Myocardial Infarction, Peripheral Arterial Disease, Peripheral Venous Disease, Type II Diabetes, Neuropathy Photos Photo Uploaded By: John Cool, RN, [name], John on 05/12/2015 16:49:05 Wound Measurements Length: (cm) 8 % Reduction in Width: (cm) 1.2 % Reduction in Depth: (cm) 0.4 Area: (cm) 7.54 Volume: (cm) 3.016 Area: 0% Volume: 0% Wound Description Classification: Grade 2 Rengel, [name] ([id number]) Wound Margin: Flat and Intact Exudate Amount: Medium Exudate Type: Serous Exudate Color: amber Wound Bed Granulation Amount: Small (1-33%) Exposed Structure Granulation Quality: Pink Fascia Exposed: No Necrotic Amount: Medium (34-66%) Fat Layer Exposed: No Necrotic Quality: Adherent Slough Tendon Exposed: No Muscle Exposed: No Joint Exposed: No Bone Exposed: No Limited to Skin  Breakdown Periwound Skin Texture Texture Color No Abnormalities Noted: No No Abnormalities Noted: No Callus: No Atrophie Blanche: No Crepitus: No Cyanosis: No Excoriation: No Ecchymosis: No Fluctuance: No Erythema: No Friable: No Hemosiderin Staining: No Induration: No Mottled: No Localized Edema: No Pallor: No Rash: No Rubor: No Scarring: Yes Temperature / Pain Moisture Temperature: No Abnormality No Abnormalities Noted: No Tenderness on Palpation: Yes Dry / Scaly: No Maceration: No Moist: Yes Wound Preparation Ulcer Cleansing: Rinsed/Irrigated with Saline Topical Anesthetic Applied: Other: lidocaine 4%, Treatment Notes Wound #1 (Right, Medial Lower Leg) 1. Cleansed with: Clean wound with Normal Saline 2. Anesthetic Topical Lidocaine 4% cream to wound bed prior to debridement 4. Dressing Applied: Hydrogel 5. Secondary Dressing Applied [name], [name] ([id number]) Bordered Foam Dressing Notes mepitel one Electronic Signature(s) Signed: 05/12/2015 5:34:28 PM By: [name] Entered By: John Cool, RN, [name], John on 05/12/2015 14:45:27 Shi, [name] ([id number]) -------------------------------------------------------------------------------- [place] Details Patient Name: [name] Date of Service: 05/12/2015 2:00 PM Medical Record Number: [id number] Patient Account Number: [id number] Date of Birth/Sex: 1961/06/12 (54 y.o. Male) Treating RN: [name] Primary Care Physician: [name] Other Clinician: Referring Physician: Rutherford [name] Treating Physician/Extender: [name] in Treatment: 0 Vital Signs Time Taken: 14:14 Temperature (F): 97.8 Height (in): 67 Pulse (bpm): 89 Weight (lbs): 248 Respiratory Rate (breaths/min): 18 Body Mass Index (BMI): 38.8 Blood Pressure (mmHg): 135/72 Reference Range: 80 - 120 mg / dl Electronic Signature(s) Signed: 05/12/2015 5:34:28 PM By: [name] Entered  By: John Cool, RN, [name], John on 05/12/2015 14:15:17

## 2015-05-13 NOTE — Progress Notes (Signed)
TYRECK, BELL ([id number]) Visit Report for 05/12/2015 Chief Complaint Document Details Patient Name: [name], [name]. Date of Service: 05/12/2015 2:00 PM Medical Record Number: [id number] Patient Account Number: [id number] Date of Birth/Sex: Oct 24, 1960 (54 y.o. Male) Treating RN: Primary Care Physician: Rutherford Nail Other Clinician: Referring Physician: Rutherford Nail Treating Physician/Extender: Frann Rider in Treatment: 0 Information Obtained from: Patient Chief Complaint Patient presents to the wound care center for a consult due non healing wound he had a lacerated wound to his right lateral calf on September 5 of this year. Electronic Signature(s) Signed: 05/12/2015 3:32:32 PM By: Christin Fudge MD, FACS Entered By: Christin Fudge on 05/12/2015 15:32:32 [name], [name] ([id number]) -------------------------------------------------------------------------------- Debridement Details Patient Name: [name]. Date of Service: 05/12/2015 2:00 PM Medical Record Number: [id number] Patient Account Number: [id number] Date of Birth/Sex: 01-Feb-1961 (54 y.o. Male) Treating RN: Primary Care Physician: Rutherford Nail Other Clinician: Referring Physician: Rutherford Nail Treating Physician/Extender: Frann Rider in Treatment: 0 Debridement Performed for Wound #1 Right,Medial Lower Leg Assessment: Performed By: Physician Christin Fudge, MD Debridement: Open Wound/Selective Debridement Selective Description: Pre-procedure Yes Verification/Time Out Taken: Start Time: 03:16 Pain Control: Other : lidocaine 4% Level: Non-Viable Tissue Total Area Debrided (L x 8 (cm) x 1.2 (cm) = 9.6 (cm) W): Tissue and other Viable, Non-Viable, Exudate, Fibrin/Slough, Subcutaneous material debrided: Instrument: Other Bleeding: Minimum Hemostasis Achieved: Pressure End Time: 03:20 Procedural Pain: 2 Post Procedural Pain: 0 Response to Treatment: Procedure was  tolerated well Post Debridement Measurements of Total Wound Length: (cm) 8 Width: (cm) 1.2 Depth: (cm) 0 Volume: (cm) 0 Post Procedure Diagnosis Same as Pre-procedure Electronic Signature(s) Signed: 05/12/2015 3:32:10 PM By: Christin Fudge MD, FACS Entered By: Christin Fudge on 05/12/2015 15:32:09 [name] ([id number]) -------------------------------------------------------------------------------- HPI Details Patient Name: [name]. Date of Service: 05/12/2015 2:00 PM Medical Record Number: [id number] Patient Account Number: [id number] Date of Birth/Sex: 1960-08-13 (54 y.o. Male) Treating RN: Primary Care Physician: Rutherford Nail Other Clinician: Referring Physician: Rutherford Nail Treating Physician/Extender: Frann Rider in Treatment: 0 History of Present Illness Location: injury to the right lateral calf 03/30/2015 Quality: Patient reports experiencing a sharp pain to affected area(s). Severity: Patient states wound (s) are getting better. Duration: Patient has had the wound for < 5 weeks prior to presenting for treatment Timing: Pain in wound is constant (hurts all the time) Context: The wound occurred when the patient injured himself with a sharp object and he had a lacerated wound Modifying Factors: Consults to this date include: 2 rounds of antibiotics which include doxycycline and ciprofloxacin. Associated Signs and Symptoms: Patient reports having difficulty standing for long periods. HPI Description: 53 year old gentleman who is known to have diabetes mellitus, hyperlipidemia, chronic pain syndrome, long-term use of opiate , peripheral artery disease and has had stents placed in lower extremities by Dr. Nicoletta Dress in 2014, carotid stenosis, coronary artery disease, status post CABG, CHF, COPD, atrial fibrillation, obesity, and cardiomyopathy. He reports that he had a wound on his right leg for which he was put on Cipro and doxycycline. He quit  smoking about 18 months ago and prior to that smoked about a pack of cigarettes a day. x-ray of his leg done on 03/30/2015 showed no acute bony abnormality but he did have soft tissue calcification. Review of his arterial procedure notes revealed that on 05/14/2013 he had an abdominal aortogram with a right lower extremity runoff, angioplasty of the right common femoral artery, angioplasty of the right common iliac artery and angioplasty of  the left external iliac artery by Dr. Delana Meyer. Electronic Signature(s) Signed: 05/12/2015 3:35:22 PM By: Christin Fudge MD, FACS Previous Signature: 05/12/2015 2:28:21 PM Version By: Christin Fudge MD, FACS Entered By: Christin Fudge on 05/12/2015 15:35:22 [name], [name] ([id number]) -------------------------------------------------------------------------------- Physical Exam Details Patient Name: [name] Date of Service: 05/12/2015 2:00 PM Medical Record Number: [id number] Patient Account Number: [id number] Date of Birth/Sex: 1960-10-03 (54 y.o. Male) Treating RN: Primary Care Physician: Rutherford Nail Other Clinician: Referring Physician: Rutherford Nail Treating Physician/Extender: Frann Rider in Treatment: 0 Constitutional . Pulse regular. Respirations normal and unlabored. Afebrile. . Eyes Nonicteric. Reactive to light. Ears, Nose, Mouth, and Throat Lips, teeth, and gums WNL.Marland Kitchen Moist mucosa without lesions . Neck supple and nontender. No palpable supraclavicular or cervical adenopathy. Normal sized without goiter. Respiratory WNL. No retractions.. Cardiovascular Pedal Pulses not easily palpable and ABI could not be measured. No clubbing, cyanosis or edema. Lymphatic No adneopathy. No adenopathy. No adenopathy. Musculoskeletal Adexa without tenderness or enlargement.. Digits and nails w/o clubbing, cyanosis, infection, petechiae, ischemia, or inflammatory conditions.. Integumentary (Hair, Skin) No suspicious lesions.  No crepitus or fluctuance. No peri-wound warmth or erythema. No masses.Marland Kitchen Psychiatric Judgement and insight Intact.. No evidence of depression, anxiety, or agitation.. Notes he has a lacerated wound on the lateral part of his right calf and there is minimal slough and some healthy granulation tissue. Debridement was done with a saline soaked gauze. Electronic Signature(s) Signed: 05/12/2015 3:36:24 PM By: Christin Fudge MD, FACS Entered By: Christin Fudge on 05/12/2015 15:36:23 [name], [name] ([id number]) -------------------------------------------------------------------------------- Physician Orders Details Patient Name: [name]. Date of Service: 05/12/2015 2:00 PM Medical Record Number: [id number] Patient Account Number: [id number] Date of Birth/Sex: 06-06-1961 (53 y.o. Male) Treating RN: Cornell Barman Primary Care Physician: Rutherford Nail Other Clinician: Referring Physician: Rutherford Nail Treating Physician/Extender: Frann Rider in Treatment: 0 Verbal / Phone Orders: Yes Clinician: Cornell Barman Read Back and Verified: Yes Diagnosis Coding Wound Cleansing Wound #1 Right,Medial Lower Leg o Clean wound with Normal Saline. o Cleanse wound with mild soap and water o May Shower, gently pat wound dry prior to applying new dressing. Anesthetic Wound #1 Right,Medial Lower Leg o Topical Lidocaine 4% cream applied to wound bed prior to debridement Primary Wound Dressing Wound #1 Right,Medial Lower Leg o Other: - Hydrogel AG Secondary Dressing Wound #1 Right,Medial Lower Leg o Mepitel One o Boardered Foam Dressing Dressing Change Frequency Wound #1 Right,Medial Lower Leg o Change dressing every day. Follow-up Appointments Wound #1 Right,Medial Lower Leg o Return Appointment in 1 week. Edema Control Wound #1 Right,Medial Lower Leg o Elevate legs to the level of the heart and pump ankles as often as possible Additional Orders /  Instructions Wound #1 Right,Medial Lower Leg o Increase protein intake. o Other: - get diabetes under control Ciaravino, [name] ([id number]) Medications-please add to medication list. Wound #1 Right,Medial Lower Leg o P.O. Antibiotics - continue antibiotics Electronic Signature(s) Signed: 05/12/2015 4:16:00 PM By: Christin Fudge MD, FACS Signed: 05/12/2015 5:34:28 PM By: Gretta Cool RN, BSN, Kim RN, BSN Entered By: Gretta Cool, RN, BSN, Kim on 05/12/2015 15:20:14 [name], [name] ([id number]) -------------------------------------------------------------------------------- Problem List Details Patient Name: [name], MCCREE. Date of Service: 05/12/2015 2:00 PM Medical Record Number: [id number] Patient Account Number: [id number] Date of Birth/Sex: 31-Aug-1960 (54 y.o. 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 Date of Service: 05/12/2015 2:00 PM Medical Record Number: [id number] Patient Account Number: [id number] Date of Birth/Sex: 05-Sep-1960 (54 y.o. Male) Treating RN: Primary Care Physician: Rutherford Nail Other Clinician: Referring Physician: Rutherford Nail Treating Physician/Extender: Frann Rider in Treatment: 0 Subjective Chief Complaint Information obtained from Patient Patient presents to the wound care center for a consult due non healing wound he had a lacerated wound to his right lateral calf on September 5 of this year. History of Present Illness (HPI) The following HPI elements were documented for the patient's wound: Location: injury to the right lateral calf 03/30/2015 Quality: Patient reports experiencing a sharp pain to affected area(s). Severity: Patient states wound (s) are getting better. Duration: Patient has had the wound for < 5 weeks prior to presenting for treatment Timing: Pain in wound is constant (hurts all the time) Context: The wound occurred when the patient injured himself with a sharp object and he had a lacerated wound Modifying Factors: Consults to this date include: 2 rounds of antibiotics which include doxycycline and ciprofloxacin. Associated Signs and Symptoms: Patient reports having difficulty standing for long periods. 54 year old gentleman who is known to have diabetes mellitus, hyperlipidemia, chronic pain syndrome, long-term use of opiate , peripheral artery disease and has had stents placed in lower extremities by Dr. Nicoletta Dress in 2014, carotid stenosis, coronary artery disease, status post CABG, CHF, COPD, atrial fibrillation, obesity, and cardiomyopathy. He reports that he had a wound on his right leg for which he was put on Cipro and doxycycline. He quit smoking about 18 months ago and prior to that smoked about a pack of cigarettes a day. x-ray of his leg done on 03/30/2015 showed no acute bony abnormality but he did have soft tissue calcification. Review of his arterial procedure notes revealed that on 05/14/2013 he had an abdominal aortogram with a right lower extremity runoff, angioplasty of the right common femoral artery, angioplasty of the right common iliac artery and  angioplasty of the left external iliac artery by Dr. Delana Meyer. Wound History Patient presents with 1 open wound that has been present for approximately 03/30/2015. Patient has been treating wound in the following manner: abx; mupiricin cream. Laboratory tests have been performed in the last month. Patient reportedly has not tested positive for an antibiotic resistant organism. Patient reportedly has not tested positive for osteomyelitis. Patient reportedly has had testing performed to evaluate circulation in the legs. Patient experiences the following problems associated with their wounds: infection, swelling. Patient History HARJOT, DIBELLO ([id number]) Information obtained from Patient. Allergies No Known Drug Allergies Family History Cancer - Father, Diabetes - Mother, Heart Disease - Mother, Hypertension - Mother, Seizures - Father, No family history of Kidney Disease, Lung Disease, Stroke, Thyroid Problems. Social History Former smoker - 19 months, Marital Status - Married, Alcohol Use - Rarely, Drug Use - No History, Caffeine Use - Rarely. Medical History Eyes Denies history of Cataracts, Glaucoma Ear/Nose/Mouth/Throat Denies history of Chronic sinus problems/congestion, Middle ear problems Hematologic/Lymphatic Denies history of Anemia, Hemophilia, Human Immunodeficiency Virus, Lymphedema, Sickle Cell Disease Respiratory Patient has  history of Chronic Obstructive Pulmonary Disease (COPD) Denies history of Aspiration, Asthma, Pneumothorax, Sleep Apnea, Tuberculosis Cardiovascular Patient has history of Congestive Heart Failure, Coronary Artery Disease, Myocardial Infarction, Peripheral Arterial Disease, Peripheral Venous Disease Denies history of Angina, Arrhythmia, Deep Vein Thrombosis, Hypertension, Hypotension, Phlebitis, Vasculitis Gastrointestinal Denies history of Cirrhosis , Colitis, Crohn s, Hepatitis A, Hepatitis B, Hepatitis C Endocrine Patient has history of Type II  Diabetes Denies history of Type I Diabetes Genitourinary Denies history of End Stage Renal Disease Immunological Denies history of Lupus Erythematosus, Raynaud s, Scleroderma Integumentary (Skin) Denies history of History of Burn, History of pressure wounds Musculoskeletal Denies history of Gout, Rheumatoid Arthritis, Osteoarthritis, Osteomyelitis Neurologic Patient has history of Neuropathy - feet Denies history of Dementia, Quadriplegia, Paraplegia, Seizure Disorder Oncologic Denies history of Received Chemotherapy, Received Radiation Psychiatric Denies history of Anorexia/bulimia Patient is treated with Insulin, Oral Agents. Blood sugar is not tested. [name], [name] ([id number]) Medical And Surgical History Notes Constitutional Symptoms (General Health) Test for Nodules on lungs(4); heart attack (10/20/2013); stents in both legs (05/2013); DM; CABG; COPA; Quit smoking (66months ago) Review of Systems (ROS) Constitutional Symptoms (General Health) The patient has no complaints or symptoms. Eyes The patient has no complaints or symptoms. Ear/Nose/Mouth/Throat The patient has no complaints or symptoms. Hematologic/Lymphatic The patient has no complaints or symptoms. Respiratory Complains or has symptoms of Shortness of Breath. Denies complaints or symptoms of Chronic or frequent coughs. Cardiovascular Complains or has symptoms of LE edema. Denies complaints or symptoms of Chest pain. Gastrointestinal The patient has no complaints or symptoms. Endocrine Denies complaints or symptoms of Hepatitis, Thyroid disease, Polydypsia (Excessive Thirst). Genitourinary The patient has no complaints or symptoms. Immunological The patient has no complaints or symptoms. 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 Denies complaints or symptoms of Swelling. Musculoskeletal The patient has no complaints or  symptoms. Neurologic The patient has no complaints or symptoms. Oncologic The patient has no complaints or symptoms. Psychiatric Denies complaints or symptoms of Anxiety, Claustrophobia. Medications carvedilol 3.125 mg tablet oral 1 1 tablet oral aspirin 81 mg tablet,delayed release oral tablet,delayed release (DR/EC) oral acetaminophen 325 mg tablet oral 2 2 tablet oral hydrocodone 5 mg-acetaminophen 325 mg tablet oral 1 1 tablet oral Spiriva with HandiHaler 18 mcg and inhalation capsules inhalation 1 1 capsule, w/inhalation device [name], [name] ([id number]) inhalation gabapentin 300 mg capsule oral 1 1 capsule oral metformin 1,000 mg tablet oral 1 1 tablet oral glipizide ER 10 mg tablet, extended release 24 hr oral 1 1 tablet extended release 24hr oral pravastatin 40 mg tablet oral 1 1 tablet oral lisinopril 2.5 mg tablet oral 1 1 tablet oral albuterol sulfate HFA 90 mcg/actuation aerosol inhaler inhalation 2 2 HFA aerosol inhaler inhalation Novolin 70/30 100 unit/mL subcutaneous suspension subcutaneous suspension subcutaneous furosemide 40 mg tablet oral 1 1 tablet oral magnesium oxide 400 mg tablet oral 1 1 tablet oral potassium chloride ER 10 mEq tablet,extended release oral 1 1 tablet extended release oral ciprofloxacin 500 mg tablet oral 1 1 tablet oral sertraline 100 mg tablet oral 1 1 tablet oral Vibramycin 100 mg capsule oral 1 1 capsule oral mupirocin 2 % topical ointment topical ointment topical Objective Constitutional Pulse regular. Respirations normal and unlabored. Afebrile. Vitals Time Taken: 2:14 PM, Height: 67 in, Weight: 248 lbs, BMI: 38.8, Temperature: 97.8 F, Pulse: 89 bpm, Respiratory Rate: 18 breaths/min, Blood Pressure: 135/72 mmHg. Eyes Nonicteric. Reactive to light. Ears, Nose,  Mouth, and Throat Lips, teeth, and gums WNL.Marland Kitchen Moist mucosa without lesions . Neck supple and nontender. No palpable supraclavicular or cervical adenopathy. Normal sized  without goiter. Respiratory WNL. No retractions.. Cardiovascular Pedal Pulses not easily palpable and ABI could not be measured. No clubbing, cyanosis or edema. Lymphatic No adneopathy. No adenopathy. No adenopathy. Musculoskeletal Adexa without tenderness or enlargement.. Digits and nails w/o clubbing, cyanosis, infection, petechiae, ischemia, or inflammatory conditions.ZAE, [name] ([id number]) Psychiatric Judgement and insight Intact.. No evidence of depression, anxiety, or agitation.. General Notes: he has a lacerated wound on the lateral part of his right calf and there is minimal slough and some healthy granulation tissue. Debridement was done with a saline soaked gauze. Integumentary (Hair, Skin) No suspicious lesions. No crepitus or fluctuance. No peri-wound warmth or erythema. No masses.. Wound #1 status is Open. Original cause of wound was Trauma. The wound is located on the Right,Medial Lower Leg. The wound measures 8cm length x 1.2cm width x 0.4cm depth; 7.54cm^2 area and 3.016cm^3 volume. The wound is limited to skin breakdown. There is a medium amount of serous drainage noted. The wound margin is flat and intact. There is small (1-33%) pink granulation within the wound bed. There is a medium (34-66%) amount of necrotic tissue within the wound bed including Adherent Slough. The periwound skin appearance exhibited: Scarring, Moist. The periwound skin appearance did not exhibit: Callus, Crepitus, Excoriation, Fluctuance, Friable, Induration, Localized Edema, Rash, Dry/Scaly, Maceration, Atrophie Blanche, Cyanosis, Ecchymosis, Hemosiderin Staining, Mottled, Pallor, Rubor, Erythema. Periwound temperature was noted as No Abnormality. The periwound has tenderness on palpation. 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 he also has peripheral vascular disease and is a reformed smoker. I have recommended hydrogel with Silver to be applied into the wound with an occlusive dressing and a bordered foam over this. I have encouraged him to get his blood sugars under control and to continue working with the vascular office to get his stents placed. He understands he'll come to see me on a regular basis and we will appropriately monitor his wound. Procedures Bedoy, KAZUMI LACHNEY ([id number]) Wound #1 Wound #1 is a Diabetic Wound/Ulcer of the Lower Extremity located on the Right,Medial Lower Leg . There was a Non-Viable Tissue Open Wound/Selective [phone]) debridement with total area of 9.6 sq cm performed by Christin Fudge, MD. with the following instrument(s): to remove Viable and Non-Viable tissue/material including Exudate, Fibrin/Slough, and Subcutaneous after achieving pain control using Other (lidocaine 4%). A time out was conducted prior to the start of the procedure. A Minimum amount of bleeding was controlled with Pressure. The procedure was tolerated well with a pain level of 2 throughout and a pain level of 0 following the procedure. Post Debridement Measurements: 8cm length x 1.2cm width x 0cm depth; 0cm^3 volume. Post procedure Diagnosis Wound #1: Same as Pre-Procedure Plan Wound Cleansing: Wound #1 Right,Medial Lower Leg: Clean wound with Normal Saline. Cleanse wound with mild soap and water May Shower, gently pat wound dry prior to applying new dressing. Anesthetic: Wound #1 Right,Medial Lower Leg: Topical Lidocaine 4% cream applied to wound bed prior to debridement Primary Wound Dressing: Wound #1 Right,Medial Lower Leg: Other: - Hydrogel AG Secondary Dressing: Wound #1 Right,Medial Lower Leg: Mepitel  One Boardered Foam  Dressing Dressing Change Frequency: Wound #1 Right,Medial Lower Leg: Change dressing every day. Follow-up Appointments: Wound #1 Right,Medial Lower Leg: Return Appointment in 1 week. Edema Control: Wound #1 Right,Medial Lower Leg: Elevate legs to the level of the heart and pump ankles as often as possible Additional Orders / Instructions: Wound #1 Right,Medial Lower Leg: Increase protein intake. Other: - get diabetes under control Medications-please add to medication list.: Wound #1 Right,Medial Lower Leg: P.O. Antibiotics - continue antibiotics [name], [name] ([id number]) This 54 year old gentleman has uncontrolled diabetes mellitus because of poor diet. he also has peripheral vascular disease and is a reformed smoker. I have recommended hydrogel with Silver to be applied into the wound with an occlusive dressing and a bordered foam over this. I have encouraged him to get his blood sugars under control and to continue working with the vascular office to get his stents placed. He understands he'll come to see me on a regular basis and we will appropriately monitor his wound. Electronic Signature(s) Signed: 05/12/2015 3:38:15 PM By: Christin Fudge MD, FACS Entered By: Christin Fudge on 05/12/2015 15:38:15 Furber, [name] ([id number]) -------------------------------------------------------------------------------- ROS/PFSH Details Patient Name: [name]. Date of Service: 05/12/2015 2:00 PM Medical Record Number: [id number] Patient Account Number: [id number] Date of Birth/Sex: June 07, 1961 (54 y.o. Male) Treating RN: Cornell Barman Primary Care Physician: Rutherford Nail Other Clinician: Referring Physician: Rutherford Nail Treating Physician/Extender: Frann Rider in Treatment: 0 Information Obtained From Patient Wound History Do you currently have one or more open woundso Yes How many open wounds do you currently haveo 1 Approximately how long have you had your  woundso 03/30/2015 How have you been treating your wound(s) until nowo abx; mupiricin cream Has your wound(s) ever healed and then re-openedo No Have you had any lab work done in the past montho Yes Who ordered the lab work [place] Have you tested positive for an antibiotic resistant organism (MRSA, VRE)o No Have you tested positive for osteomyelitis (bone infection)o No Have you had any tests for circulation on your legso Yes Who ordered the testo 03/2015 Where was the test doneo [facility] Have you had other problems associated with your woundso Infection, Swelling Constitutional Symptoms (General Health) Complaints and Symptoms: No Complaints or Symptoms Complaints and Symptoms: Negative for: Fatigue; Fever; Chills; Marked Weight Change Medical History: Past Medical History Notes: Test for Nodules on lungs(4); heart attack (10/20/2013); stents in both legs (05/2013); DM; CABG; COPA; Quit smoking (93months ago) Hematologic/Lymphatic Complaints and Symptoms: No Complaints or Symptoms Complaints and Symptoms: Negative for: Bleeding / Clotting Disorders; Human Immunodeficiency Virus Medical History: Negative for: Anemia; Hemophilia; Human Immunodeficiency Virus; Lymphedema; Sickle Cell Disease Furuta, MATTHEWJAMES PETRASEK. ([id number]) Respiratory Complaints and Symptoms: Positive for: Shortness of Breath Negative for: Chronic or frequent coughs Medical History: Positive for: Chronic Obstructive Pulmonary Disease (COPD) Negative for: Aspiration; Asthma; Pneumothorax; Sleep Apnea; Tuberculosis Cardiovascular Complaints and Symptoms: Positive for: LE edema Negative for: Chest pain Medical History: Positive for: Congestive Heart Failure; Coronary Artery Disease; Myocardial Infarction; Peripheral Arterial Disease; Peripheral Venous Disease Negative for: Angina; Arrhythmia; Deep Vein Thrombosis; Hypertension; Hypotension; Phlebitis; Vasculitis Endocrine Complaints and Symptoms: Negative  for: Hepatitis; Thyroid disease; Polydypsia (Excessive Thirst) Medical History: Positive for: Type II Diabetes Negative for: Type I Diabetes Time with diabetes: 18 years Treated with: Insulin, Oral agents Blood sugar tested every day: No Genitourinary Complaints and Symptoms: No Complaints or Symptoms Complaints and Symptoms: Negative for: Kidney failure/ Dialysis; Incontinence/dribbling Medical History: Negative for: End Stage  Renal Disease Integumentary (Skin) Complaints and Symptoms: Positive for: Wounds; Bleeding or bruising tendency; 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 Negative for: Swelling Medical History: CHUONG, CASEBEER ([id number]) Negative for: History of Burn; History of pressure wounds Psychiatric Complaints and Symptoms: Negative for: Anxiety; Claustrophobia Medical History: Negative for: Anorexia/bulimia Eyes Complaints and Symptoms: No Complaints or Symptoms Medical History: Negative for: Cataracts; Glaucoma Ear/Nose/Mouth/Throat Complaints and Symptoms: No Complaints or Symptoms Medical History: Negative for: Chronic sinus problems/congestion; Middle ear problems Gastrointestinal Complaints and Symptoms: No Complaints or Symptoms Medical History: Negative for: Cirrhosis ; Colitis; Crohnos; Hepatitis A; Hepatitis B; Hepatitis C Immunological Complaints and Symptoms: No Complaints or Symptoms Medical History: Negative for: Lupus Erythematosus; Raynaudos; Scleroderma Musculoskeletal Complaints and Symptoms: No Complaints or Symptoms Medical History: Negative for: Gout; Rheumatoid Arthritis; Osteoarthritis; Osteomyelitis Neurologic Winship, HADLEY DETLOFF. ([id number]) Complaints and Symptoms: No Complaints or Symptoms Medical History: Positive for: Neuropathy - feet Negative for: Dementia; Quadriplegia; Paraplegia; Seizure Disorder Oncologic Complaints and Symptoms: No Complaints or Symptoms Medical History: Negative for: Received Chemotherapy;  Received Radiation Immunizations Tetanus Vaccine: Last tetanus shot: 04/24/2014 Family and Social History Cancer: Yes - Father; Diabetes: Yes - Mother; Heart Disease: Yes - Mother; Hypertension: Yes - Mother; Kidney Disease: No; Lung Disease: No; Seizures: Yes - Father; Stroke: No; Thyroid Problems: No; Former smoker - 19 months; Marital Status - Married; Alcohol Use: Rarely; Drug Use: No History; Caffeine Use: Rarely; Advanced Directives: Yes (Not Provided); Patient does not want information on Advanced Directives; Living Will: Yes (Not Provided); Medical Power of Attorney: Yes (Not Provided) Physician Affirmation I have reviewed and agree with the above information. Electronic Signature(s) Signed: 05/12/2015 3:36:42 PM By: Christin Fudge MD, FACS Signed: 05/12/2015 5:34:28 PM By: Gretta Cool RN, BSN, Kim RN, BSN Entered By: Christin Fudge on 05/12/2015 15:36:40 Trudel, [name] ([id number]) -------------------------------------------------------------------------------- [place] Details Patient Name: [name]. Date of Service: 05/12/2015 Medical Record Number: [id number] Patient Account Number: [id number] Date of Birth/Sex: 28-Sep-1960 (54 y.o. 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 Facility Procedures CPT4: Description Modifier Quantity Code [id number] 99213 - WOUND CARE VISIT-LEV 3 EST PT 1 CPT4: 99371696 97597 - DEBRIDE WOUND 1ST 20 SQ CM OR < 1 ICD-10 Description Diagnosis E11.622 Type 2 diabetes mellitus with other skin ulcer S81.811A Laceration without  foreign body, right lower leg, initial encounter I70.532 Atherosclerosis of  nonautologous biological bypass graft(s) of the right leg with ulceration of calf Physician Procedures CPT4: Description Modifier Quantity Code 7893810 99204 - WC PHYS LEVEL 4 - NEW PT 1 ICD-10 Description Diagnosis E11.622 Type 2 diabetes mellitus with other skin ulcer S81.811A Laceration without foreign body, right lower leg, initial encounter I70.532  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

## 2015-05-13 NOTE — Progress Notes (Signed)
[name] ([id number]) Visit Report for 05/12/2015 Abuse/Suicide Risk Screen Details Patient Name: [name], [name]. Date of Service: 05/12/2015 2:00 PM Medical Record Number: [id number] Patient Account Number: [id number] Date of Birth/Sex: 01-24-1961 (54 y.o. Male) Treating RN: Cornell Barman Primary Care Physician: Rutherford Nail Other Clinician: Referring Physician: Rutherford Nail Treating Physician/Extender: Frann Rider in Treatment: 0 Abuse/Suicide Risk Screen Items Answer ABUSE/SUICIDE RISK SCREEN: Has anyone close to you tried to hurt or harm you recentlyo No Do you feel uncomfortable with anyone in your familyo No Has anyone forced you do things that you didnot want to doo No Do you have any thoughts of harming yourselfo No Patient displays signs or symptoms of abuse and/or neglect. No Electronic Signature(s) Signed: 05/12/2015 5:34:28 PM By: Gretta Cool, RN, BSN, Kim RN, BSN Entered By: Gretta Cool, RN, BSN, Kim on 05/12/2015 14:30:49 Chretien, Orson Ape ([id number]) -------------------------------------------------------------------------------- Activities of Daily Living Details Patient Name: [name]. Date of Service: 05/12/2015 2:00 PM Medical Record Number: [id number] Patient Account Number: [id number] Date of Birth/Sex: [birth year] (54 y.o. Male) Treating RN: Cornell Barman Primary Care Physician: Rutherford Nail Other Clinician: Referring Physician: Rutherford Nail Treating Physician/Extender: Frann Rider in Treatment: 0 Activities of Daily Living Items Answer Activities of Daily Living (Please select one for each item) Drive Automobile Completely Able Take Medications Completely Able Use Telephone Completely Able Care for Appearance Completely Able Use Toilet Completely Able Bath / Shower Completely Able Dress Self Completely Able Feed Self Completely Able Walk Completely Able Get In / Out Bed Completely Able Housework Completely  Able Prepare Meals Completely Able Handle Money Completely Able Shop for Self Completely Able Electronic Signature(s) Signed: 05/12/2015 5:34:28 PM By: Gretta Cool, RN, BSN, Kim RN, BSN Entered By: Gretta Cool, RN, BSN, Kim on 05/12/2015 14:31:02 Allcock, Orson Ape ([id number]) -------------------------------------------------------------------------------- Education Assessment Details Patient Name: [name]. Date of Service: 05/12/2015 2:00 PM Medical Record Number: [id number] Patient Account Number: [id number] Date of Birth/Sex: Jan 20, 1961 (54 y.o. Male) Treating RN: Cornell Barman Primary Care Physician: Rutherford Nail Other Clinician: Referring Physician: Rutherford Nail Treating Physician/Extender: Frann Rider in Treatment: 0 Primary Learner Assessed: Patient Learning Preferences/Education Level/Primary Language Learning Preference: Explanation, Demonstration Highest Education Level: College or Above Preferred Language: English Cognitive Barrier Assessment/Beliefs Language Barrier: No Translator Needed: No Memory Deficit: No Emotional Barrier: No Cultural/Religious Beliefs Affecting Medical No Care: Physical Barrier Assessment Impaired Vision: No Impaired Hearing: No Knowledge/Comprehension Assessment Knowledge Level: High Comprehension Level: High Ability to understand written High instructions: Ability to understand verbal High instructions: Motivation Assessment Anxiety Level: Calm Cooperation: Cooperative Education Importance: Acknowledges Need Interest in Health Problems: Asks Questions Perception: Coherent Willingness to Engage in Self- High Management Activities: Readiness to Engage in Self- High Management Activities: Electronic Signature(s) Signed: 05/12/2015 5:34:28 PM By: Gretta Cool, RN, BSN, Kim RN, BSN [address], Orson Ape ([id number]) Entered By: Gretta Cool, RN, BSN, Kim on 05/12/2015 14:31:43 Leap, Orson Ape  ([id number]) -------------------------------------------------------------------------------- Fall Risk Assessment Details Patient Name: [name]. Date of Service: 05/12/2015 2:00 PM Medical Record Number: [id number] Patient Account Number: [id number] Date of Birth/Sex: 26-Jan-1961 (54 y.o. Male) Treating RN: Cornell Barman Primary Care Physician: Rutherford Nail Other Clinician: Referring Physician: Rutherford Nail Treating Physician/Extender: Frann Rider in Treatment: 0 Fall Risk Assessment Items FALL RISK ASSESSMENT: History of falling - immediate or within 3 months 25 Yes Secondary diagnosis 0 No Ambulatory aid None/bed rest/wheelchair/nurse 0 Yes Crutches/cane/walker 0 No Furniture 0 No IV Access/Saline Lock 0 No Gait/Training Normal/bed rest/immobile 0 Yes Weak 0 No Impaired  0 No Mental Status Oriented to own ability 0 Yes Electronic Signature(s) Signed: 05/12/2015 5:34:28 PM By: Gretta Cool, RN, BSN, Kim RN, BSN Entered By: Gretta Cool, RN, BSN, Kim on 05/12/2015 14:31:53 Solem, Orson Ape ([id number]) -------------------------------------------------------------------------------- Nutrition Risk Assessment Details Patient Name: [name]. Date of Service: 05/12/2015 2:00 PM Medical Record Number: [id number] Patient Account Number: [id number] Date of Birth/Sex: 08-10-1960 (54 y.o. Male) Treating RN: Cornell Barman Primary Care Physician: Rutherford Nail Other Clinician: Referring Physician: Rutherford Nail Treating Physician/Extender: Frann Rider in Treatment: 0 Height (in): 67 Weight (lbs): 248 Body Mass Index (BMI): 38.8 Nutrition Risk Assessment Items NUTRITION RISK SCREEN: I have an illness or condition that made me change the kind and/or 0 No amount of food I eat I eat fewer than two meals per day 0 No I eat few fruits and vegetables, or milk products 0 No I have three or more drinks of beer, liquor or wine almost every day 0 No I have tooth  or mouth problems that make it hard for me to eat 0 No I don't always have enough money to buy the food I need 0 No I eat alone most of the time 0 No I take three or more different prescribed or over-the-counter drugs a 0 No day Without wanting to, I have lost or gained 10 pounds in the last six 0 No months I am not always physically able to shop, cook and/or feed myself 0 No Nutrition Protocols Good Risk Protocol 0 No interventions needed Moderate Risk Protocol Electronic Signature(s) Signed: 05/12/2015 5:34:28 PM By: Gretta Cool, RN, BSN, Kim RN, BSN Entered By: Gretta Cool, RN, BSN, Kim on 05/12/2015 14:31:59

## 2015-05-19 ENCOUNTER — Encounter: Payer: No Typology Code available for payment source | Admitting: Surgery

## 2015-05-19 DIAGNOSIS — S81811A Laceration without foreign body, right lower leg, initial encounter: Secondary | ICD-10-CM | POA: Diagnosis not present

## 2015-05-20 NOTE — Progress Notes (Addendum)
TREON, KEHL ([id number]) Visit Report for 05/19/2015 Chief Complaint Document Details Patient Name: [name], [name]. Date of Service: 05/19/2015 3:45 PM Medical Record Number: [id number] Patient Account Number: [id number] Date of Birth/Sex: 05/02/1961 (54 y.o. Male) Treating RN: Cornell Barman Primary Care Physician: Rutherford Nail Other Clinician: Referring Physician: Rutherford Nail Treating Physician/Extender: Frann Rider in Treatment: 1 Information Obtained from: Patient Chief Complaint Patient presents to the wound care center for a consult due non healing wound he had a lacerated wound to his right lateral calf on September 5 of this year. Electronic Signature(s) Signed: 05/19/2015 4:24:30 PM By: Christin Fudge MD, FACS Entered By: Christin Fudge on 05/19/2015 16:24:30 [name], [name] ([id number]) -------------------------------------------------------------------------------- HPI Details Patient Name: [name] Date of Service: 05/19/2015 3:45 PM Medical Record Number: [id number] Patient Account Number: [id number] Date of Birth/Sex: 27-Sep-1960 (54 y.o. Male) Treating RN: Cornell Barman Primary Care Physician: Rutherford Nail Other Clinician: Referring Physician: Rutherford Nail Treating Physician/Extender: Frann Rider in Treatment: 1 History of Present Illness Location: injury to the right lateral calf 03/30/2015 Quality: Patient reports experiencing a sharp pain to affected area(s). Severity: Patient states wound (s) are getting better. Duration: Patient has had the wound for < 5 weeks prior to presenting for treatment Timing: Pain in wound is constant (hurts all the time) Context: The wound occurred when the patient injured himself with a sharp object and he had a lacerated wound Modifying Factors: Consults to this date include: 2 rounds of antibiotics which include doxycycline and ciprofloxacin. Associated Signs and Symptoms: Patient reports  having difficulty standing for long periods. HPI Description: 54 year old gentleman who is known to have diabetes mellitus, hyperlipidemia, chronic pain syndrome, long-term use of opiate , peripheral artery disease and has had stents placed in lower extremities by Dr. Nicoletta Dress in 2014, carotid stenosis, coronary artery disease, status post CABG, CHF, COPD, atrial fibrillation, obesity, and cardiomyopathy. He reports that he had a wound on his right leg for which he was put on Cipro and doxycycline. He quit smoking about 18 months ago and prior to that smoked about a pack of cigarettes a day. x-ray of his leg done on 03/30/2015 showed no acute bony abnormality but he did have soft tissue calcification. Review of his arterial procedure notes revealed that on 05/14/2013 he had an abdominal aortogram with a right lower extremity runoff, angioplasty of the right common femoral artery, angioplasty of the right common iliac artery and angioplasty of the left external iliac artery by Dr. Delana Meyer. 05/19/2015 -- was seen by Dr. Hortencia Pilar on 04/25/2015 and his advice was due to the fact that the patient has increasing symptoms and now having claudication and rest pain he has recommended angiography and intervention. the patient however has not setup his appointment for the stent and I have urged him to do this as soon as possible. Electronic Signature(s) Signed: 05/19/2015 4:25:00 PM By: Christin Fudge MD, FACS Previous Signature: 05/19/2015 4:12:05 PM Version By: Christin Fudge MD, FACS Entered By: Christin Fudge on 05/19/2015 16:25:00 [name], [name] ([id number]) -------------------------------------------------------------------------------- Physical Exam Details Patient Name: [name] Date of Service: 05/19/2015 3:45 PM Medical Record Number: [id number] Patient Account Number: [id number] Date of Birth/Sex: 22-Sep-1960 (54 y.o. Male) Treating RN: Cornell Barman Primary Care Physician:  Rutherford Nail Other Clinician: Referring Physician: Rutherford Nail Treating Physician/Extender: Frann Rider in Treatment: 1 Constitutional . Pulse regular. Respirations normal and unlabored. Afebrile. . Eyes Nonicteric. Reactive to light. Ears, Nose, Mouth, and Throat Lips, teeth, and gums  WNL.. Moist mucosa without lesions . Neck supple and nontender. No palpable supraclavicular or cervical adenopathy. Normal sized without goiter. Respiratory WNL. No retractions.. Cardiovascular Pedal Pulses WNL. No clubbing, cyanosis or edema. Lymphatic No adneopathy. No adenopathy. No adenopathy. Musculoskeletal Adexa without tenderness or enlargement.. Digits and nails w/o clubbing, cyanosis, infection, petechiae, ischemia, or inflammatory conditions.. Integumentary (Hair, Skin) No suspicious lesions. No crepitus or fluctuance. No peri-wound warmth or erythema. No masses.Marland Kitchen Psychiatric Judgement and insight Intact.. No evidence of depression, anxiety, or agitation.. Notes the wound in the right lower extremity has less slough and some granulation tissue and this was washed out with moist saline gauze. Electronic Signature(s) Signed: 05/19/2015 4:25:34 PM By: Christin Fudge MD, FACS Entered By: Christin Fudge on 05/19/2015 16:25:34 [place], [name] ([id number]) -------------------------------------------------------------------------------- Physician Orders Details Patient Name: [name]. Date of Service: 05/19/2015 3:45 PM Medical Record Number: [id number] Patient Account Number: [id number] Date of Birth/Sex: Sep 21, 1960 (54 y.o. Male) Treating RN: Cornell Barman Primary Care Physician: Rutherford Nail Other Clinician: Referring Physician: Rutherford Nail Treating Physician/Extender: Frann Rider in Treatment: 1 Verbal / Phone Orders: Yes Clinician: Cornell Barman Read Back and Verified: Yes Diagnosis Coding Wound Cleansing Wound #1 Right,Medial Lower Leg o  Clean wound with Normal Saline. o Cleanse wound with mild soap and water o May Shower, gently pat wound dry prior to applying new dressing. Anesthetic Wound #1 Right,Medial Lower Leg o Topical Lidocaine 4% cream applied to wound bed prior to debridement Primary Wound Dressing Wound #1 Right,Medial Lower Leg o Silvadene Cream - Hydrogel AG Secondary Dressing Wound #1 Right,Medial Lower Leg o Boardered Foam Dressing Dressing Change Frequency Wound #1 Right,Medial Lower Leg o Change dressing every day. Follow-up Appointments Wound #1 Right,Medial Lower Leg o Return Appointment in 1 week. Edema Control Wound #1 Right,Medial Lower Leg o Elevate legs to the level of the heart and pump ankles as often as possible Additional Orders / Instructions Wound #1 Right,Medial Lower Leg o Increase protein intake. o Other: - get diabetes under control [name], [name] ([id number]) Electronic Signature(s) Signed: 05/19/2015 4:26:55 PM By: Christin Fudge MD, FACS Signed: 05/20/2015 5:15:26 PM By: Gretta Cool RN, BSN, Kim RN, BSN Entered By: Gretta Cool, RN, BSN, Kim on 05/19/2015 16:21:41 [name], [name] ([id number]) -------------------------------------------------------------------------------- Problem List Details Patient Name: [name], [name]. Date of Service: 05/19/2015 3:45 PM Medical Record Number: [id number] Patient Account Number: [id number] Date of Birth/Sex: 03-30-1961 (54 y.o. 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 Date of Service: 05/19/2015 3:45 PM Medical Record Number: [id number] Patient Account Number: [id number] Date of Birth/Sex: 1961/07/11 (54 y.o. Male) Treating RN: Cornell Barman Primary Care Physician: Rutherford Nail Other Clinician: Referring Physician: Rutherford Nail Treating Physician/Extender: Frann Rider in Treatment: 1 Subjective Chief Complaint Information obtained from Patient Patient presents to the wound care center for a consult due non healing wound he had a lacerated wound to his right lateral calf  on September 5 of this year. History of Present Illness (HPI) The following HPI elements were documented for the patient's wound: Location: injury to the right lateral calf 03/30/2015 Quality: Patient reports experiencing a sharp pain to affected area(s). Severity: Patient states wound (s) are getting better. Duration: Patient has had the wound for < 5 weeks prior to presenting for treatment Timing: Pain in wound is constant (hurts all the time) Context: The wound occurred when the patient injured himself with a sharp object and he had a lacerated wound Modifying Factors: Consults to this date include: 2 rounds of antibiotics which include doxycycline and ciprofloxacin. Associated Signs and Symptoms: Patient reports having difficulty standing for long periods. 54 year old gentleman who is known to have diabetes mellitus, hyperlipidemia, chronic pain syndrome, long-term use of opiate , peripheral artery disease and has had stents placed in lower extremities by Dr. Nicoletta Dress in 2014,  carotid stenosis, coronary artery disease, status post CABG, CHF, COPD, atrial fibrillation, obesity, and cardiomyopathy. He reports that he had a wound on his right leg for which he was put on Cipro and doxycycline. He quit smoking about 18 months ago and prior to that smoked about a pack of cigarettes a day. x-ray of his leg done on 03/30/2015 showed no acute bony abnormality but he did have soft tissue calcification. Review of his arterial procedure notes revealed that on 05/14/2013 he had an abdominal aortogram with a right lower extremity runoff, angioplasty of the right common femoral artery, angioplasty of the right common iliac artery and angioplasty of the left external iliac artery by Dr. Delana Meyer. 05/19/2015 -- was seen by Dr. Hortencia Pilar on 04/25/2015 and his advice was due to the fact that the patient has increasing symptoms and now having claudication and rest pain he has recommended angiography and intervention. the patient however has not setup his appointment for the stent and I have urged him to do this as soon as possible. [name], [name] ([id number]) Objective Constitutional Pulse regular. Respirations normal and unlabored. Afebrile. Vitals Time Taken: 4:09 PM, Height: 67 in, Weight: 248 lbs, BMI: 38.8, Temperature: 97.8 F, Pulse: 94 bpm, Respiratory Rate: 18 breaths/min, Blood Pressure: 115/54 mmHg. Eyes Nonicteric. Reactive to light. Ears, Nose, Mouth, and Throat Lips, teeth, and gums WNL.Marland Kitchen Moist mucosa without lesions . Neck supple and nontender. No palpable supraclavicular or cervical adenopathy. Normal sized without goiter. Respiratory WNL. No retractions.. Cardiovascular Pedal Pulses WNL. No clubbing, cyanosis or edema. Lymphatic No adneopathy. No adenopathy. No adenopathy. Musculoskeletal Adexa without tenderness or enlargement.. Digits and nails w/o clubbing, cyanosis, infection, petechiae, ischemia, or inflammatory  conditions.Marland Kitchen Psychiatric Judgement and insight Intact.. No evidence of depression, anxiety, or agitation.. General Notes: the wound in the right lower extremity has less slough and some granulation tissue and this was washed out with moist saline gauze. Integumentary (Hair, Skin) No suspicious lesions. No crepitus or fluctuance. No peri-wound warmth or erythema. No masses.. Wound #1 status is Open. Original cause of wound was Trauma. The wound is located on the Right,Medial Lower Leg. The wound measures 2.5cm length x 1.1cm width x 0.3cm depth; 2.16cm^2 area and 0.648cm^3 volume. The wound is limited to skin breakdown. There is a medium amount of serous drainage noted. The wound margin is flat and intact. There is small (1-33%) pink granulation within the wound bed. There is a medium (34-66%) amount of necrotic tissue within the wound bed including Adherent Slough. The periwound skin appearance exhibited: Scarring, Moist. The periwound skin appearance did not exhibit: [name],  [name] ([id number]) Callus, Crepitus, Excoriation, Fluctuance, Friable, Induration, Localized Edema, Rash, Dry/Scaly, Maceration, Atrophie Blanche, Cyanosis, Ecchymosis, Hemosiderin Staining, Mottled, Pallor, Rubor, Erythema. Periwound temperature was noted as No Abnormality. The periwound has tenderness on palpation. 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 He will come back and see me next week. Plan Wound Cleansing: Wound #1 Right,Medial Lower Leg: Clean wound with Normal  Saline. Cleanse wound with mild soap and water May Shower, gently pat wound dry prior to applying new dressing. Anesthetic: Wound #1 Right,Medial Lower Leg: Topical Lidocaine 4% cream applied to wound bed prior to debridement Primary Wound Dressing: Wound #1 Right,Medial Lower Leg: Silvadene Cream - Hydrogel AG Secondary Dressing: Wound #1 Right,Medial Lower Leg: Boardered Foam Dressing Dressing Change Frequency: Wound #1 Right,Medial Lower Leg: Change dressing every day. Follow-up Appointments: Wound #1 Right,Medial Lower Leg: Return Appointment in 1 week. [name], [name] ([id number]) Edema Control: Wound #1 Right,Medial Lower Leg: Elevate legs to the level of the heart and pump ankles as often as possible Additional Orders / Instructions: Wound #1 Right,Medial Lower Leg: Increase protein intake. Other: - get diabetes under control We will continue with hydrogel with Silver to be applied on a daily basis and I have urged him to see his vascular surgeon ASAP for a appointment regarding his intervention and possible stent placement. He will come back and see me next week. Electronic Signature(s) Signed: 05/19/2015 4:26:30 PM By: Christin Fudge MD, FACS Entered By: Christin Fudge on 05/19/2015 16:26:30 [name], [name] ([id number]) -------------------------------------------------------------------------------- SuperBill Details Patient Name: [name] Date of Service: 05/19/2015 Medical Record Number: [id number] Patient Account Number: [id number] Date of Birth/Sex: April 21, 1961 (54 y.o. Male) Treating RN: Cornell Barman Primary Care Physician: Rutherford Nail Other Clinician: Referring Physician: Rutherford Nail Treating Physician/Extender: Frann Rider in Treatment: 1 Diagnosis Coding ICD-10 Codes Code Description [phone] Type 2 diabetes mellitus with other skin ulcer S81.811A Laceration without foreign body, right lower leg, initial encounter Atherosclerosis  of nonautologous biological bypass graft(s) of the right leg with ulceration I70.532 of calf Facility Procedures CPT4 Code: [id number] Description: [phone] - WOUND CARE VISIT-LEV 2 EST PT Modifier: Quantity: 1 Physician Procedures CPT4: Description Modifier Quantity Code [id number] - WC PHYS LEVEL 3 - EST PT 1 ICD-10 Description Diagnosis E11.622 Type 2 diabetes mellitus with other skin ulcer S81.811A Laceration without foreign body, right lower leg, initial encounter I70.532  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

## 2015-05-21 NOTE — Progress Notes (Signed)
HAU, SANOR ([id number]) Visit Report for 05/19/2015 Arrival Information Details Patient Name: [name], [name]. Date of Service: 05/19/2015 3:45 PM Medical Record Number: [id number] Patient Account Number: [id number] Date of Birth/Sex: 26-Aug-1960 (54 y.o. Male) Treating RN: Cornell Barman Primary Care Physician: Rutherford Nail Other Clinician: Referring Physician: Rutherford Nail Treating Physician/Extender: Frann Rider in Treatment: 1 Visit Information History Since Last Visit Added or deleted any medications: No Patient Arrived: Ambulatory Any new allergies or adverse reactions: No Arrival Time: 16:09 Had a fall or experienced change in No Accompanied By: self activities of daily living that may affect Transfer Assistance: None risk of falls: Patient Identification Verified: Yes Hospitalized since last visit: No Secondary Verification Process Yes Has Dressing in Place as Prescribed: Yes Completed: Pain Present Now: No Patient Requires Transmission- No Based Precautions: Patient Has Alerts: Yes Patient Alerts: Patient on Blood Thinner Type II Diabetic Electronic Signature(s) Signed: 05/20/2015 5:15:26 PM By: Gretta Cool, RN, BSN, Kim RN, BSN Entered By: Gretta Cool, RN, BSN, Kim on 05/19/2015 18:84:16 Yolanda Bonine, Orson Ape ([id number]) -------------------------------------------------------------------------------- Clinic Level of Care Assessment Details Patient Name: [name]. Date of Service: 05/19/2015 3:45 PM Medical Record Number: [id number] Patient Account Number: [id number] Date of Birth/Sex: Dec 22, 1960 (54 y.o. Male) Treating RN: Cornell Barman Primary Care Physician: Rutherford Nail Other Clinician: Referring Physician: Rutherford Nail Treating Physician/Extender: Frann Rider in Treatment: 1 Clinic Level of Care Assessment Items TOOL 4 Quantity Score []  - Use when only an EandM is performed on FOLLOW-UP visit 0 ASSESSMENTS - Nursing Assessment /  Reassessment []  - Reassessment of Co-morbidities (includes updates in patient status) 0 X - Reassessment of Adherence to Treatment Plan 1 5 ASSESSMENTS - Wound and Skin Assessment / Reassessment X - Simple Wound Assessment / Reassessment - one wound 1 5 []  - Complex Wound Assessment / Reassessment - multiple wounds 0 []  - Dermatologic / Skin Assessment (not related to wound area) 0 ASSESSMENTS - Focused Assessment []  - Circumferential Edema Measurements - multi extremities 0 []  - Nutritional Assessment / Counseling / Intervention 0 []  - Lower Extremity Assessment (monofilament, tuning fork, pulses) 0 []  - Peripheral Arterial Disease Assessment (using hand held doppler) 0 ASSESSMENTS - Ostomy and/or Continence Assessment and Care []  - Incontinence Assessment and Management 0 []  - Ostomy Care Assessment and Management (repouching, etc.) 0 PROCESS - Coordination of Care X - Simple Patient / Family Education for ongoing care 1 15 []  - Complex (extensive) Patient / Family Education for ongoing care 0 []  - Staff obtains Programmer, systems, Records, Test Results / Process Orders 0 []  - Staff telephones HHA, Nursing Homes / Clarify orders / etc 0 []  - Routine Transfer to another Facility (non-emergent condition) 0 Sevigny, Orson Ape ([id number]) []  - Routine Hospital Admission (non-emergent condition) 0 []  - New Admissions / Biomedical engineer / Ordering NPWT, Apligraf, etc. 0 []  - Emergency Hospital Admission (emergent condition) 0 X - Simple Discharge Coordination 1 10 []  - Complex (extensive) Discharge Coordination 0 PROCESS - Special Needs []  - Pediatric / Minor Patient Management 0 []  - Isolation Patient Management 0 []  - Hearing / Language / Visual special needs 0 []  - Assessment of Community assistance (transportation, D/C planning, etc.) 0 []  - Additional assistance / Altered mentation 0 []  - Support Surface(s) Assessment (bed, cushion, seat, etc.) 0 INTERVENTIONS - Wound Cleansing /  Measurement X - Simple Wound Cleansing - one wound 1 5 []  - Complex Wound Cleansing - multiple wounds 0 X - Wound Imaging (photographs - any number of  wounds) 1 5 []  - Wound Tracing (instead of photographs) 0 X - Simple Wound Measurement - one wound 1 5 []  - Complex Wound Measurement - multiple wounds 0 INTERVENTIONS - Wound Dressings X - Small Wound Dressing one or multiple wounds 1 10 []  - Medium Wound Dressing one or multiple wounds 0 []  - Large Wound Dressing one or multiple wounds 0 []  - Application of Medications - topical 0 []  - Application of Medications - injection 0 INTERVENTIONS - Miscellaneous []  - External ear exam 0 Noell, Orson Ape ([id number]) []  - Specimen Collection (cultures, biopsies, blood, body fluids, etc.) 0 []  - Specimen(s) / Culture(s) sent or taken to Lab for analysis 0 []  - Patient Transfer (multiple staff / Harrel Lemon Lift / Similar devices) 0 []  - Simple Staple / Suture removal (25 or less) 0 []  - Complex Staple / Suture removal (26 or more) 0 []  - Hypo / Hyperglycemic Management (close monitor of Blood Glucose) 0 []  - Ankle / Brachial Index (ABI) - do not check if billed separately 0 X - Vital Signs 1 5 Has the patient been seen at the hospital within the last three years: Yes Total Score: 65 Level Of Care: New/Established - Level 2 Electronic Signature(s) Signed: 05/20/2015 5:15:26 PM By: Gretta Cool, RN, BSN, Kim RN, BSN Entered By: Gretta Cool, RN, BSN, Kim on 05/19/2015 16:22:18 Hollifield, Orson Ape ([id number]) -------------------------------------------------------------------------------- Encounter Discharge Information Details Patient Name: [name]. Date of Service: 05/19/2015 3:45 PM Medical Record Number: [id number] Patient Account Number: [id number] Date of Birth/Sex: August 17, 1960 (54 y.o. Male) Treating RN: Cornell Barman Primary Care Physician: Rutherford Nail Other Clinician: Referring Physician: Rutherford Nail Treating Physician/Extender: Frann Rider in Treatment: 1 Encounter Discharge Information Items Discharge Pain Level: 0 Discharge Condition: Stable Ambulatory Status: Ambulatory Discharge Destination: Home Transportation: Private Auto Accompanied By: self Schedule Follow-up Appointment: Yes Medication Reconciliation completed and provided to Patient/Care Yes Kimmerly Lora: Provided on Clinical Summary of Care: 05/19/2015 Form Type Recipient Paper Patient [employer] Electronic Signature(s) Signed: 05/19/2015 4:29:46 PM By: Ruthine Dose Entered By: Ruthine Dose on 05/19/2015 16:29:46 Chumney, Orson Ape ([id number]) -------------------------------------------------------------------------------- Lower Extremity Assessment Details Patient Name: [name]. Date of Service: 05/19/2015 3:45 PM Medical Record Number: [id number] Patient Account Number: [id number] Date of Birth/Sex: 1960/09/29 (54 y.o. Male) Treating RN: Cornell Barman Primary Care Physician: Rutherford Nail Other Clinician: Referring Physician: Rutherford Nail Treating Physician/Extender: Frann Rider in Treatment: 1 Vascular Assessment Pulses: Posterior Tibial Dorsalis Pedis Palpable: [Left:Yes] Extremity colors, hair growth, and conditions: Extremity Color: [Left:Normal] Hair Growth on Extremity: [Left:Yes] Temperature of Extremity: [Left:Warm] Capillary Refill: [Left:< 3 seconds] Toe Nail Assessment Left: Right: Thick: No Discolored: No Deformed: No Improper Length and Hygiene: No Electronic Signature(s) Signed: 05/20/2015 5:15:26 PM By: Gretta Cool, RN, BSN, Kim RN, BSN Entered By: Gretta Cool, RN, BSN, Kim on 05/19/2015 16:13:45 Baik, Orson Ape ([id number]) -------------------------------------------------------------------------------- Multi Wound Chart Details Patient Name: [name]. Date of Service: 05/19/2015 3:45 PM Medical Record Number: [id number] Patient Account Number: [id number] Date of Birth/Sex: [birth year] (54 y.o.  Male) Treating RN: Cornell Barman Primary Care Physician: Rutherford Nail Other Clinician: Referring Physician: Rutherford Nail Treating Physician/Extender: Frann Rider in Treatment: 1 Vital Signs Height(in): 67 Pulse(bpm): 94 Weight(lbs): 248 Blood Pressure 115/54 (mmHg): Body Mass Index(BMI): 39 Temperature(F): 97.8 Respiratory Rate 18 (breaths/min): Photos: [1:No Photos] [N/A:N/A] Wound Location: [1:Right Lower Leg - Medial] [N/A:N/A] Wounding Event: [1:Trauma] [N/A:N/A] Primary Etiology: [1:Diabetic Wound/Ulcer of the Lower Extremity] [N/A:N/A] Comorbid History: [1:Chronic Obstructive Pulmonary Disease (COPD), Congestive Heart Failure,  Coronary Artery Disease, Myocardial Infarction, Peripheral Arterial Disease, Peripheral Venous Disease, Type II Diabetes, Neuropathy] [N/A:N/A] Date Acquired: [1:03/30/2015] [N/A:N/A] Weeks of Treatment: [1:1] [N/A:N/A] Wound Status: [1:Open] [N/A:N/A] Measurements L x W x D 2.5x1.1x0.3 [N/A:N/A] (cm) Area (cm) : [1:2.16] [N/A:N/A] Volume (cm) : [1:0.648] [N/A:N/A] % Reduction in Area: [1:71.40%] [N/A:N/A] % Reduction in Volume: 78.50% [N/A:N/A] Classification: [1:Grade 2] [N/A:N/A] Exudate Amount: [1:Medium] [N/A:N/A] Exudate Type: [1:Serous] [N/A:N/A] Exudate Color: [1:amber] [N/A:N/A] Wound Margin: [1:Flat and Intact] [N/A:N/A] Granulation Amount: [1:Small (1-33%)] [N/A:N/A] Granulation Quality: Pink N/A N/A Necrotic Amount: Medium (34-66%) N/A N/A Exposed Structures: Fascia: No N/A N/A Fat: No Tendon: No Muscle: No Joint: No Bone: No Limited to Skin Breakdown Periwound Skin Texture: Scarring: Yes N/A N/A Edema: No Excoriation: No Induration: No Callus: No Crepitus: No Fluctuance: No Friable: No Rash: No Periwound Skin Moist: Yes N/A N/A Moisture: Maceration: No Dry/Scaly: No Periwound Skin Color: Atrophie Blanche: No N/A N/A Cyanosis: No Ecchymosis: No Erythema: No Hemosiderin Staining: No Mottled:  No Pallor: No Rubor: No Temperature: No Abnormality N/A N/A Tenderness on Yes N/A N/A Palpation: Wound Preparation: Ulcer Cleansing: N/A N/A Rinsed/Irrigated with Saline Topical Anesthetic Applied: Other: lidocaine 4% Treatment Notes Electronic Signature(s) Signed: 05/20/2015 5:15:26 PM By: Gretta Cool, RN, BSN, Kim RN, BSN Entered By: Gretta Cool, RN, BSN, Kim on 05/19/2015 16:16:46 Zavada, Orson Ape ([id number]) -------------------------------------------------------------------------------- [place] Details Patient Name: JEFFERIE, HOLSTON. Date of Service: 05/19/2015 3:45 PM Medical Record Number: [id number] Patient Account Number: [id number] Date of Birth/Sex: 26-Apr-1961 (54 y.o. Male) Treating RN: Cornell Barman Primary Care Physician: Rutherford Nail Other Clinician: Referring Physician: Rutherford Nail Treating Physician/Extender: Frann Rider in Treatment: 1 Active Inactive Abuse / Safety / Falls / Self Care Management Nursing Diagnoses: Potential for falls Self care deficit: actual or potential Goals: Patient will remain injury free Date Initiated: 05/12/2015 Goal Status: Active Interventions: Assess fall risk on admission and as needed Notes: Orientation to the Wound Care Program Nursing Diagnoses: Knowledge deficit related to the wound healing center program Goals: Patient/caregiver will verbalize understanding of the [place] Program Date Initiated: 05/12/2015 Goal Status: Active Interventions: Provide education on orientation to the wound center Notes: Soft Tissue Infection Nursing Diagnoses: Potential for infection: soft tissue Goals: Patient will remain free of wound infection JAQUARI, RECKNER ([id number]) Date Initiated: 05/12/2015 Goal Status: Active Signs and symptoms of infection will be recognized early to allow for prompt treatment Date Initiated: 05/12/2015 Goal Status: Active Interventions: Assess signs and  symptoms of infection every visit Treatment Activities: Systemic antibiotics : 05/19/2015 Notes: Wound/Skin Impairment Nursing Diagnoses: Impaired tissue integrity Goals: Ulcer/skin breakdown will heal within 14 weeks Date Initiated: 05/12/2015 Goal Status: Active Interventions: Assess ulceration(s) every visit Treatment Activities: Topical wound management initiated : 05/19/2015 Notes: Electronic Signature(s) Signed: 05/20/2015 5:15:26 PM By: Gretta Cool, RN, BSN, Kim RN, BSN Entered By: Gretta Cool, RN, BSN, Kim on 05/19/2015 16:16:40 Jaros, Orson Ape ([id number]) -------------------------------------------------------------------------------- Pain Assessment Details Patient Name: [name]. Date of Service: 05/19/2015 3:45 PM Medical Record Number: [id number] Patient Account Number: [id number] Date of Birth/Sex: 07/23/1961 (54 y.o. Male) Treating RN: Cornell Barman Primary Care Physician: Rutherford Nail Other Clinician: Referring Physician: Rutherford Nail Treating Physician/Extender: Frann Rider in Treatment: 1 Active Problems Location of Pain Severity and Description of Pain Patient Has Paino No Site Locations Pain Management and Medication Current Pain Management: Electronic Signature(s) Signed: 05/20/2015 5:15:26 PM By: Gretta Cool, RN, BSN, Kim RN, BSN Entered By: Gretta Cool, RN, BSN, Kim on 05/19/2015 16:09:30 Dudzik, Orson Ape ([id number]) --------------------------------------------------------------------------------  Patient/Caregiver Education Details Patient Name: BODEN, STUCKY. Date of Service: 05/19/2015 3:45 PM Medical Record Number: [id number] Patient Account Number: [id number] Date of Birth/Gender: [birth year] (54 y.o. Male) Treating RN: Cornell Barman Primary Care Physician: Rutherford Nail Other Clinician: Referring Physician: Rutherford Nail Treating Physician/Extender: Frann Rider in Treatment: 1 Education Assessment Education Provided  To: Patient Education Topics Provided Wound/Skin Impairment: Handouts: Caring for Your Ulcer, Other: continue wound care as prescribed Methods: Demonstration Responses: State content correctly Electronic Signature(s) Signed: 05/20/2015 5:15:26 PM By: Gretta Cool, RN, BSN, Kim RN, BSN Entered By: Gretta Cool, RN, BSN, Kim on 05/19/2015 16:23:57 Vital, Orson Ape ([id number]) -------------------------------------------------------------------------------- Wound Assessment Details Patient Name: [name]. Date of Service: 05/19/2015 3:45 PM Medical Record Number: [id number] Patient Account Number: [id number] Date of Birth/Sex: 15-Oct-1960 (54 y.o. Male) Treating RN: Cornell Barman Primary Care Physician: Rutherford Nail Other Clinician: Referring Physician: Rutherford Nail Treating Physician/Extender: Frann Rider in Treatment: 1 Wound Status Wound Number: 1 Primary Diabetic Wound/Ulcer of the Lower Etiology: Extremity Wound Location: Right Lower Leg - Medial Wound Open Wounding Event: Trauma Status: Date Acquired: 03/30/2015 Comorbid Chronic Obstructive Pulmonary Disease Weeks Of Treatment: 1 History: (COPD), Congestive Heart Failure, Clustered Wound: No Coronary Artery Disease, Myocardial Infarction, Peripheral Arterial Disease, Peripheral Venous Disease, Type II Diabetes, Neuropathy Photos Photo Uploaded By: Gretta Cool, RN, BSN, Kim on 05/19/2015 17:16:42 Wound Measurements Length: (cm) 2.5 Width: (cm) 1.1 Depth: (cm) 0.3 Area: (cm) 2.16 Volume: (cm) 0.648 % Reduction in Area: 71.4% % Reduction in Volume: 78.5% Wound Description Classification: Grade 2 Cheema, Orson Ape ([id number]) Wound Margin: Flat and Intact Exudate Amount: Medium Exudate Type: Serous Exudate Color: amber Wound Bed Granulation Amount: Small (1-33%) Exposed Structure Granulation Quality: Pink Fascia Exposed: No Necrotic Amount: Medium (34-66%) Fat Layer Exposed: No Necrotic Quality: Adherent  Slough Tendon Exposed: No Muscle Exposed: No Joint Exposed: No Bone Exposed: No Limited to Skin Breakdown Periwound Skin Texture Texture Color No Abnormalities Noted: No No Abnormalities Noted: No Callus: No Atrophie Blanche: No Crepitus: No Cyanosis: No Excoriation: No Ecchymosis: No Fluctuance: No Erythema: No Friable: No Hemosiderin Staining: No Induration: No Mottled: No Localized Edema: No Pallor: No Rash: No Rubor: No Scarring: Yes Temperature / Pain Moisture Temperature: No Abnormality No Abnormalities Noted: No Tenderness on Palpation: Yes Dry / Scaly: No Maceration: No Moist: Yes Wound Preparation Ulcer Cleansing: Rinsed/Irrigated with Saline Topical Anesthetic Applied: Other: lidocaine 4%, Treatment Notes Wound #1 (Right, Medial Lower Leg) 1. Cleansed with: Clean wound with Normal Saline 2. Anesthetic Topical Lidocaine 4% cream to wound bed prior to debridement 4. Dressing Applied: Silvadene Cream 5. Secondary Dressing Applied MELVYN, HOMMES ([id number]) Bordered Foam Dressing Electronic Signature(s) Signed: 05/20/2015 5:15:26 PM By: Gretta Cool, RN, BSN, Kim RN, BSN Entered By: Gretta Cool, RN, BSN, Kim on 05/19/2015 16:15:17 Foell, Orson Ape ([id number]) -------------------------------------------------------------------------------- [place] Details Patient Name: [name] Date of Service: 05/19/2015 3:45 PM Medical Record Number: [id number] Patient Account Number: [id number] Date of Birth/Sex: 11/08/1960 (54 y.o. Male) Treating RN: Cornell Barman Primary Care Physician: Rutherford Nail Other Clinician: Referring Physician: Rutherford Nail Treating Physician/Extender: Frann Rider in Treatment: 1 Vital Signs Time Taken: 16:09 Temperature (F): 97.8 Height (in): 67 Pulse (bpm): 94 Weight (lbs): 248 Respiratory Rate (breaths/min): 18 Body Mass Index (BMI): 38.8 Blood Pressure (mmHg): 115/54 Reference Range: 80 - 120 mg /  dl Electronic Signature(s) Signed: 05/20/2015 5:15:26 PM By: Gretta Cool, RN, BSN, Kim RN, BSN Entered By: Gretta Cool, RN, BSN, Kim on 05/19/2015 16:11:51

## 2015-05-26 ENCOUNTER — Encounter: Payer: No Typology Code available for payment source | Attending: Surgery | Admitting: Surgery

## 2015-05-26 DIAGNOSIS — G8929 Other chronic pain: Secondary | ICD-10-CM | POA: Insufficient documentation

## 2015-05-26 DIAGNOSIS — X58XXXA Exposure to other specified factors, initial encounter: Secondary | ICD-10-CM | POA: Insufficient documentation

## 2015-05-26 DIAGNOSIS — I2510 Atherosclerotic heart disease of native coronary artery without angina pectoris: Secondary | ICD-10-CM | POA: Diagnosis not present

## 2015-05-26 DIAGNOSIS — I70532 Atherosclerosis of nonautologous biological bypass graft(s) of the right leg with ulceration of calf: Secondary | ICD-10-CM | POA: Diagnosis not present

## 2015-05-26 DIAGNOSIS — S81811A Laceration without foreign body, right lower leg, initial encounter: Secondary | ICD-10-CM | POA: Insufficient documentation

## 2015-05-26 DIAGNOSIS — J449 Chronic obstructive pulmonary disease, unspecified: Secondary | ICD-10-CM | POA: Insufficient documentation

## 2015-05-26 DIAGNOSIS — I429 Cardiomyopathy, unspecified: Secondary | ICD-10-CM | POA: Insufficient documentation

## 2015-05-26 DIAGNOSIS — I739 Peripheral vascular disease, unspecified: Secondary | ICD-10-CM | POA: Diagnosis not present

## 2015-05-26 DIAGNOSIS — I509 Heart failure, unspecified: Secondary | ICD-10-CM | POA: Insufficient documentation

## 2015-05-26 DIAGNOSIS — E669 Obesity, unspecified: Secondary | ICD-10-CM | POA: Diagnosis not present

## 2015-05-26 DIAGNOSIS — E11622 Type 2 diabetes mellitus with other skin ulcer: Secondary | ICD-10-CM | POA: Diagnosis not present

## 2015-05-26 DIAGNOSIS — E785 Hyperlipidemia, unspecified: Secondary | ICD-10-CM | POA: Insufficient documentation

## 2015-05-26 DIAGNOSIS — I4891 Unspecified atrial fibrillation: Secondary | ICD-10-CM | POA: Insufficient documentation

## 2015-05-26 DIAGNOSIS — Z87891 Personal history of nicotine dependence: Secondary | ICD-10-CM | POA: Diagnosis not present

## 2015-05-26 DIAGNOSIS — I6529 Occlusion and stenosis of unspecified carotid artery: Secondary | ICD-10-CM | POA: Insufficient documentation

## 2015-05-26 NOTE — Progress Notes (Addendum)
AVEDIS, BEVIS ([id number]) Visit Report for 05/26/2015 Chief Complaint Document Details Patient Name: [name], [name]. Date of Service: 05/26/2015 3:00 PM Medical Record Number: [id number] Patient Account Number: [id number] Date of Birth/Sex: 02-10-1961 (54 y.o. Male) Treating RN: Montey Hora Primary Care Physician: Rutherford Nail Other Clinician: Referring Physician: Rutherford Nail Treating Physician/Extender: Frann Rider in Treatment: 2 Information Obtained from: Patient Chief Complaint Patient presents to the wound care center for a consult due non healing wound he had a lacerated wound to his right lateral calf on September 5 of this year. Electronic Signature(s) Signed: 05/26/2015 3:37:07 PM By: Christin Fudge MD, FACS Entered By: Christin Fudge on 05/26/2015 15:37:06 Kamp, Orson Ape ([id number]) -------------------------------------------------------------------------------- Debridement Details Patient Name: [name]. Date of Service: 05/26/2015 3:00 PM Medical Record Number: [id number] Patient Account Number: [id number] Date of Birth/Sex: 07-Apr-1961 (54 y.o. Male) Treating RN: Montey Hora Primary Care Physician: Rutherford Nail Other Clinician: Referring Physician: Rutherford Nail Treating Physician/Extender: Frann Rider in Treatment: 2 Debridement Performed for Wound #1 Right,Medial Lower Leg Assessment: Performed By: Physician Christin Fudge, MD Debridement: Debridement Pre-procedure Yes Verification/Time Out Taken: Start Time: 15:39 Pain Control: Lidocaine 4% Topical Solution Level: Skin/Subcutaneous Tissue Total Area Debrided (L x 2.2 (cm) x 1.1 (cm) = 2.42 (cm) W): Tissue and other Non-Viable, Eschar, Fibrin/Slough material debrided: Instrument: Curette Bleeding: Minimum Hemostasis Achieved: Pressure End Time: 15:41 Procedural Pain: 0 Post Procedural Pain: 0 Response to Treatment: Procedure was tolerated well Post  Debridement Measurements of Total Wound Length: (cm) 2.2 Width: (cm) 1.1 Depth: (cm) 0.4 Volume: (cm) 0.76 Post Procedure Diagnosis Same as Pre-procedure Electronic Signature(s) Signed: 05/26/2015 4:24:37 PM By: Christin Fudge MD, FACS Signed: 05/26/2015 5:15:29 PM By: Montey Hora Entered By: Montey Hora on 05/26/2015 15:41:31 Vinluan, Orson Ape ([id number]) -------------------------------------------------------------------------------- HPI Details Patient Name: [name]. Date of Service: 05/26/2015 3:00 PM Medical Record Number: [id number] Patient Account Number: [id number] Date of Birth/Sex: August 04, 1960 (54 y.o. Male) Treating RN: Montey Hora Primary Care Physician: Rutherford Nail Other Clinician: Referring Physician: Rutherford Nail Treating Physician/Extender: Frann Rider in Treatment: 2 History of Present Illness Location: injury to the right lateral calf 03/30/2015 Quality: Patient reports experiencing a sharp pain to affected area(s). Severity: Patient states wound (s) are getting better. Duration: Patient has had the wound for < 5 weeks prior to presenting for treatment Timing: Pain in wound is constant (hurts all the time) Context: The wound occurred when the patient injured himself with a sharp object and he had a lacerated wound Modifying Factors: Consults to this date include: 2 rounds of antibiotics which include doxycycline and ciprofloxacin. Associated Signs and Symptoms: Patient reports having difficulty standing for long periods. HPI Description: 54 year old gentleman who is known to have diabetes mellitus, hyperlipidemia, chronic pain syndrome, long-term use of opiate , peripheral artery disease and has had stents placed in lower extremities by Dr. Nicoletta Dress in 2014, carotid stenosis, coronary artery disease, status post CABG, CHF, COPD, atrial fibrillation, obesity, and cardiomyopathy. He reports that he had a wound on his right leg  for which he was put on Cipro and doxycycline. He quit smoking about 18 months ago and prior to that smoked about a pack of cigarettes a day. x-ray of his leg done on 03/30/2015 showed no acute bony abnormality but he did have soft tissue calcification. Review of his arterial procedure notes revealed that on 05/14/2013 he had an abdominal aortogram with a right lower extremity runoff, angioplasty of the right common femoral artery, angioplasty of the  right common iliac artery and angioplasty of the left external iliac artery by Dr. Delana Meyer. 05/19/2015 -- was seen by Dr. Hortencia Pilar on 04/25/2015 and his advice was due to the fact that the patient has increasing symptoms and now having claudication and rest pain he has recommended angiography and intervention. the patient however has not setup his appointment for the stent and I have urged him to do this as soon as possible. Electronic Signature(s) Signed: 05/26/2015 3:37:12 PM By: Christin Fudge MD, FACS Entered By: Christin Fudge on 05/26/2015 15:37:12 Hainsworth, Orson Ape ([id number]) -------------------------------------------------------------------------------- Physical Exam Details Patient Name: [name] Date of Service: 05/26/2015 3:00 PM Medical Record Number: [id number] Patient Account Number: [id number] Date of Birth/Sex: Apr 27, 1961 (54 y.o. Male) Treating RN: Montey Hora Primary Care Physician: Rutherford Nail Other Clinician: Referring Physician: Rutherford Nail Treating Physician/Extender: Frann Rider in Treatment: 2 Constitutional . Pulse regular. Respirations normal and unlabored. Afebrile. . Eyes Nonicteric. Reactive to light. Ears, Nose, Mouth, and Throat Lips, teeth, and gums WNL.Marland Kitchen Moist mucosa without lesions . Neck supple and nontender. No palpable supraclavicular or cervical adenopathy. Normal sized without goiter. Respiratory WNL. No retractions.. Cardiovascular Pedal Pulses WNL. No  clubbing, cyanosis or edema. Lymphatic No adneopathy. No adenopathy. No adenopathy. Musculoskeletal Adexa without tenderness or enlargement.. Digits and nails w/o clubbing, cyanosis, infection, petechiae, ischemia, or inflammatory conditions.. Integumentary (Hair, Skin) No suspicious lesions. No crepitus or fluctuance. No peri-wound warmth or erythema. No masses.Marland Kitchen Psychiatric Judgement and insight Intact.. No evidence of depression, anxiety, or agitation.. Notes the wound has persistent slough and I will gently remove this with a curette Electronic Signature(s) Signed: 05/26/2015 3:44:05 PM By: Christin Fudge MD, FACS Entered By: Christin Fudge on 05/26/2015 15:44:04 Cardell, Orson Ape ([id number]) -------------------------------------------------------------------------------- Physician Orders Details Patient Name: [name]. Date of Service: 05/26/2015 3:00 PM Medical Record Number: [id number] Patient Account Number: [id number] Date of Birth/Sex: [birth year] (54 y.o. 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 o Cleanse wound with mild soap and water o May Shower, gently pat wound dry prior to applying new dressing. Anesthetic Wound #1 Right,Medial Lower Leg o Topical Lidocaine 4% cream applied to wound bed prior to debridement Primary Wound Dressing Wound #1  Right,Medial Lower Leg o Silvadene Cream - Hydrogel AG Secondary Dressing Wound #1 Right,Medial Lower Leg o Boardered Foam Dressing Dressing Change Frequency Wound #1 Right,Medial Lower Leg o Change dressing every day. Follow-up Appointments Wound #1 Right,Medial Lower Leg o Return Appointment in 1 week. Edema Control Wound #1 Right,Medial Lower Leg L[place], JHAMIR PICKUP. ([id number]) o Elevate legs to the level of the heart and pump ankles as often as possible Additional Orders / Instructions Wound #1 Right,Medial Lower Leg o Increase protein intake. o Other: - get diabetes under control Electronic Signature(s) Signed: 05/26/2015 4:24:37 PM By: Christin Fudge MD, FACS Signed: 05/26/2015 5:15:29 PM By: Montey Hora Entered By: Montey Hora on 05/26/2015 15:42:13 Cella, Orson Ape ([id number]) -------------------------------------------------------------------------------- Problem List Details Patient Name: [name]. Date of Service: 05/26/2015 3:00 PM Medical  Record Number: [id number] Patient Account Number: [id number] Date of Birth/Sex: 14-Nov-1960 (54 y.o. 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 Date of Service: 05/26/2015 3:00 PM Medical Record Number: [id number] Patient Account Number: [id number] Date of Birth/Sex: 06/13/1961 (54 y.o. Male) Treating RN: Montey Hora Primary Care Physician: Rutherford Nail Other Clinician: Referring Physician: Rutherford Nail Treating Physician/Extender: Frann Rider in Treatment: 2 Subjective Chief Complaint Information obtained from Patient Patient presents to the wound care center for a consult due non healing wound he had a lacerated wound to his right lateral calf on September 5 of this year. History of Present Illness (HPI) The following HPI elements were documented for the patient's wound: Location: injury to the right lateral calf 03/30/2015 Quality: Patient reports experiencing a sharp pain to affected area(s). Severity: Patient states wound (s) are getting better. Duration: Patient has had the wound for < 5 weeks prior to presenting for treatment Timing: Pain in wound is constant (hurts all the time) Context: The wound occurred when the patient injured himself with a sharp object and he had a lacerated wound Modifying Factors: Consults to this date include: 2 rounds of antibiotics which include doxycycline and ciprofloxacin. Associated Signs and Symptoms: Patient reports having difficulty standing for long periods. 54 year old gentleman who is known to have diabetes mellitus, hyperlipidemia, chronic pain syndrome, long-term use of opiate , peripheral artery disease and has had stents placed in lower extremities by Dr. Nicoletta Dress in 2014, carotid stenosis, coronary artery disease, status post CABG, CHF, COPD, atrial fibrillation, obesity, and cardiomyopathy. He reports that he had a wound on his right leg for which he was put on Cipro and doxycycline. He quit smoking about 18  months ago and prior to that smoked about a pack of cigarettes a day. x-ray of his leg done on 03/30/2015 showed no acute bony abnormality but he did have soft tissue calcification. Review of his arterial procedure notes revealed that on 05/14/2013 he had an abdominal aortogram with a right lower extremity runoff, angioplasty of the right common femoral artery, angioplasty of the right common iliac artery and angioplasty of the left external iliac artery by Dr. Delana Meyer. 05/19/2015 -- was seen by Dr. Hortencia Pilar on 04/25/2015 and his advice was due to the fact that the patient has increasing symptoms and now having claudication and rest pain he has recommended angiography and intervention. the patient however has not setup his appointment for the stent and I have urged him to do this as soon as possible. DONYAE, KILNER ([id number]) Objective Constitutional Pulse regular. Respirations normal and unlabored. Afebrile. Vitals Time Taken: 3:28 PM, Height: 67 in, Weight: 248 lbs, BMI: 38.8, Temperature: 98.1 F, Pulse: 90 bpm, Respiratory Rate: 20 breaths/min, Blood Pressure: 126/66 mmHg. Eyes Nonicteric. Reactive to light. Ears, Nose, Mouth, and Throat Lips, teeth, and gums WNL.Marland Kitchen Moist mucosa without lesions . Neck supple and nontender. No palpable supraclavicular or cervical  adenopathy. Normal sized without goiter. Respiratory WNL. No retractions.. Cardiovascular Pedal Pulses WNL. No clubbing, cyanosis or edema. Lymphatic No adneopathy. No adenopathy. No adenopathy. Musculoskeletal Adexa without tenderness or enlargement.. Digits and nails w/o clubbing, cyanosis, infection, petechiae, ischemia, or inflammatory conditions.Marland Kitchen Psychiatric Judgement and insight Intact.. No evidence of depression, anxiety, or agitation.. General Notes: the wound has persistent slough and I will gently remove this with a curette Integumentary (Hair, Skin) No suspicious lesions. No crepitus or  fluctuance. No peri-wound warmth or erythema. No masses.. Wound #1 status is Open. Original cause of wound was Trauma. The wound is located on the Right,Medial Lower Leg. The wound measures 2.2cm length x 1.1cm width x 0.2cm depth; 1.901cm^2 area and 0.38cm^3 volume. The wound is limited to skin breakdown. There is no tunneling or undermining noted. There is a medium amount of serous drainage noted. The wound margin is flat and intact. There is small (1-33%) pink granulation within the wound bed. There is a large (67-100%) amount of necrotic tissue within the wound bed including Adherent Slough. The periwound skin appearance exhibited: Scarring, Moist. The periwound skin appearance did not exhibit: Callus, Crepitus, Excoriation, Fluctuance, Friable, Induration, YITZHAK, AWAN. ([id number]) Localized Edema, Rash, Dry/Scaly, Maceration, Atrophie Blanche, Cyanosis, Ecchymosis, Hemosiderin Staining, Mottled, Pallor, Rubor, Erythema. Periwound temperature was noted as No Abnormality. The periwound has tenderness on palpation. 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 He still doesn't have a surgical date but has an appointment to see the vascular surgeon on November 7. Procedures Wound #1 Wound #1 is a Diabetic Wound/Ulcer of the Lower Extremity located on the Right,Medial Lower Leg . There was a Skin/Subcutaneous Tissue Debridement ([id number]) debridement with total area of 2.42 sq cm performed by Christin Fudge, MD. with the following instrument(s): Curette to remove Non-Viable tissue/material including  Fibrin/Slough and Eschar after achieving pain control using Lidocaine 4% Topical Solution. A time out was conducted prior to the start of the procedure. A Minimum amount of bleeding was controlled with Pressure. The procedure was tolerated well with a pain level of 0 throughout and a pain level of 0 following the procedure. Post Debridement Measurements: 2.2cm length x 1.1cm width x 0.4cm depth; 0.76cm^3 volume. Post procedure Diagnosis Wound #1: Same as Pre-Procedure Plan Wound Cleansing: Wound #1 Right,Medial Lower Leg: Clean wound with Normal Saline. TAUNO, FALOTICO ([id number]) Cleanse wound with mild soap and water May Shower, gently pat wound dry prior to applying new dressing. Anesthetic: Wound #1 Right,Medial Lower Leg: Topical Lidocaine 4% cream applied to wound bed prior to debridement Primary Wound Dressing: Wound #1 Right,Medial Lower Leg: Silvadene Cream - Hydrogel AG Secondary Dressing: Wound #1 Right,Medial Lower Leg: Boardered Foam Dressing Dressing Change Frequency: Wound #1 Right,Medial Lower Leg: Change dressing every day. Follow-up Appointments: Wound #1 Right,Medial Lower Leg: Return Appointment in 1 week. Edema Control: Wound #1 Right,Medial Lower Leg: Elevate legs to the level of the heart and pump ankles as often as possible Additional Orders / Instructions: Wound #1 Right,Medial Lower Leg: Increase protein intake. Other: - get diabetes under control After cleaning it out with a curette we'll continue with hydrogel with silver and let the wound gradually get  cleaner with autolytic debridement. He still doesn't have a surgical date but has an appointment to see the vascular surgeon on November 7. Electronic Signature(s) Signed: 05/26/2015 3:45:11 PM By: Christin Fudge MD, FACS Entered By: Christin Fudge on 05/26/2015 15:45:11 Youngblood, Orson Ape ([id number]) -------------------------------------------------------------------------------- SuperBill  Details Patient Name: [name] Date of Service: 05/26/2015 Medical Record Number: [id number] Patient Account Number: [id number] Date of Birth/Sex: [birth year] (54 y.o. 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 I70.532 of calf Facility Procedures CPT4: Description Modifier Quantity Code [id number] 11042 - DEB SUBQ TISSUE 20 SQ CM/< 1 ICD-10 Description Diagnosis E11.622 Type 2 diabetes mellitus with other skin ulcer S81.811A Laceration without foreign body, right lower leg, initial encounter  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

## 2015-05-27 NOTE — Progress Notes (Addendum)
RHONDA, VANGIESON ([id number]) Visit Report for 05/26/2015 Arrival Information Details Patient Name: [name], [name]. Date of Service: 05/26/2015 3:00 PM Medical Record Number: [id number] Patient Account Number: [id number] Date of Birth/Sex: 1961-02-20 (54 y.o. Male) Treating RN: Montey Hora Primary Care Physician: Rutherford Nail Other Clinician: Referring Physician: Rutherford Nail Treating Physician/Extender: Frann Rider in Treatment: 2 Visit Information History Since Last Visit Added or deleted any medications: No Patient Arrived: Ambulatory Any new allergies or adverse reactions: No Arrival Time: 15:27 Had a fall or experienced change in No Accompanied By: self activities of daily living that may affect Transfer Assistance: None risk of falls: Patient Identification Verified: Yes Signs or symptoms of abuse/neglect since last No Secondary Verification Process Yes visito Completed: Hospitalized since last visit: No Patient Requires Transmission- No Pain Present Now: No Based Precautions: Patient Has Alerts: Yes Patient Alerts: Patient on Blood Thinner Type II Diabetic Electronic Signature(s) Signed: 05/26/2015 5:15:29 PM By: Montey Hora Entered By: Montey Hora on 05/26/2015 15:27:19 Grand, Orson Ape ([id number]) -------------------------------------------------------------------------------- Encounter Discharge Information Details Patient Name: [name]. Date of Service: 05/26/2015 3:00 PM Medical Record Number: [id number] Patient Account Number: [id number] Date of Birth/Sex: [birth year] (54 y.o. Male) Treating RN: Montey Hora Primary Care Physician: Rutherford Nail Other Clinician: Referring Physician: Rutherford Nail Treating Physician/Extender: Frann Rider in Treatment: 2 Encounter Discharge Information Items Discharge Pain Level: 0 Discharge Condition: Stable Ambulatory Status: Ambulatory Discharge Destination:  Home Transportation: Private Auto Accompanied By: self Schedule Follow-up Appointment: Yes Medication Reconciliation completed No and provided to Patient/Care Zabrina Brotherton: Patient Clinical Summary of Care: Declined Electronic Signature(s) Signed: 05/27/2015 11:42:11 AM By: Ruthine Dose Previous Signature: 05/26/2015 3:53:50 PM Version By: Ruthine Dose Entered By: Ruthine Dose on 05/27/2015 11:42:11 Brotherton, Orson Ape ([id number]) -------------------------------------------------------------------------------- Lower Extremity Assessment Details Patient Name: [name]. Date of Service: 05/26/2015 3:00 PM Medical Record Number: [id number] Patient Account Number: [id number] Date of Birth/Sex: Apr 24, 1961 (54 y.o. Male) Treating RN: Montey Hora Primary Care Physician: Rutherford Nail Other Clinician: Referring Physician: Rutherford Nail Treating Physician/Extender: Frann Rider in Treatment: 2 Edema Assessment Assessed: [Left: No] [Right: No] Edema: [Left: Ye] [Right: s] Vascular Assessment Pulses: Posterior Tibial Dorsalis Pedis Palpable: [Right:Yes] Extremity colors, hair growth, and conditions: Extremity Color: [Right:Normal] Hair Growth on Extremity: [Right:Yes] Temperature of Extremity: [Right:Warm] Capillary Refill: [Right:< 3 seconds] Toe Nail Assessment Left: Right: Thick: No Discolored: No Deformed: No Improper Length and Hygiene: No Electronic Signature(s) Signed: 05/26/2015 5:15:29 PM By: Montey Hora Entered By: Montey Hora on 05/26/2015 15:31:53 Walder, Orson Ape ([id number]) -------------------------------------------------------------------------------- Multi Wound Chart Details Patient Name: [name]. Date of Service: 05/26/2015 3:00 PM Medical Record Number: [id number] Patient Account Number: [id number] Date of Birth/Sex: [birth year] (54 y.o. Male) Treating RN: Montey Hora Primary Care Physician: Rutherford Nail Other  Clinician: Referring Physician: Rutherford Nail Treating Physician/Extender: Frann Rider in Treatment: 2 Vital Signs Height(in): 67 Pulse(bpm): 90 Weight(lbs): 248 Blood Pressure 126/66 (mmHg): Body Mass Index(BMI): 39 Temperature(F): 98.1 Respiratory Rate 20 (breaths/min): Photos: [1:No Photos] [N/A:N/A] Wound Location: [1:Right Lower Leg - Medial] [N/A:N/A] Wounding Event: [1:Trauma] [N/A:N/A] Primary Etiology: [1:Diabetic Wound/Ulcer of the Lower Extremity] [N/A:N/A] Comorbid History: [1:Chronic Obstructive Pulmonary Disease (COPD), Congestive Heart Failure, Coronary Artery Disease, Myocardial Infarction, Peripheral Arterial Disease, Peripheral Venous Disease, Type II Diabetes, Neuropathy] [N/A:N/A] Date Acquired: [1:03/30/2015] [N/A:N/A] Weeks of Treatment: [1:2] [N/A:N/A] Wound Status: [1:Open] [N/A:N/A] Measurements L x W x D 2.2x1.1x0.2 [N/A:N/A] (cm) Area (cm) : [1:1.901] [N/A:N/A] Volume (cm) : [1:0.38] [N/A:N/A] % Reduction in Area: [1:74.80%] [  N/A:N/A] % Reduction in Volume: 87.40% [N/A:N/A] Classification: [1:Grade 2] [N/A:N/A] Exudate Amount: [1:Medium] [N/A:N/A] Exudate Type: [1:Serous] [N/A:N/A] Exudate Color: [1:amber] [N/A:N/A] Wound Margin: [1:Flat and Intact] [N/A:N/A] Granulation Amount: [1:Small (1-33%)] [N/A:N/A] Granulation Quality: Pink N/A N/A Necrotic Amount: Large (67-100%) N/A N/A Exposed Structures: Fascia: No N/A N/A Fat: No Tendon: No Muscle: No Joint: No Bone: No Limited to Skin Breakdown Epithelialization: Small (1-33%) N/A N/A Periwound Skin Texture: Scarring: Yes N/A N/A Edema: No Excoriation: No Induration: No Callus: No Crepitus: No Fluctuance: No Friable: No Rash: No Periwound Skin Moist: Yes N/A N/A Moisture: Maceration: No Dry/Scaly: No Periwound Skin Color: Atrophie Blanche: No N/A N/A Cyanosis: No Ecchymosis: No Erythema: No Hemosiderin Staining: No Mottled: No Pallor: No Rubor: No Temperature:  No Abnormality N/A N/A Tenderness on Yes N/A N/A Palpation: Wound Preparation: Ulcer Cleansing: N/A N/A Rinsed/Irrigated with Saline Topical Anesthetic Applied: Other: lidocaine 4% Treatment Notes Electronic Signature(s) Signed: 05/26/2015 5:15:29 PM By: Montey Hora Entered By: Montey Hora on 05/26/2015 15:33:44 Rutt, Orson Ape ([id number]) -------------------------------------------------------------------------------- [place] Details Patient Name: [name]. Date of Service: 05/26/2015 3:00 PM Medical Record Number: [id number] Patient Account Number: [id number] Date of Birth/Sex: Jul 08, 1961 (54 y.o. Male) Treating RN: Montey Hora Primary Care Physician: Rutherford Nail Other Clinician: Referring Physician: Rutherford Nail Treating Physician/Extender: Frann Rider in Treatment: 2 Active Inactive Abuse / Safety / Falls / Self Care Management Nursing Diagnoses: Potential for falls Self care deficit: actual or potential Goals: Patient will remain injury free Date Initiated: 05/12/2015 Goal Status: Active Interventions: Assess fall risk on admission and as needed Notes: Orientation to the Wound Care Program Nursing Diagnoses: Knowledge deficit related to the wound healing center program Goals: Patient/caregiver will verbalize understanding of the [place] Program Date Initiated: 05/12/2015 Goal Status: Active Interventions: Provide education on orientation to the wound center Notes: Soft Tissue Infection Nursing Diagnoses: Potential for infection: soft tissue Goals: Patient will remain free of wound infection CAMEREN, EARNEST ([id number]) Date Initiated: 05/12/2015 Goal Status: Active Signs and symptoms of infection will be recognized early to allow for prompt treatment Date Initiated: 05/12/2015 Goal Status: Active Interventions: Assess signs and symptoms of infection every visit Treatment  Activities: Systemic antibiotics : 05/26/2015 Notes: Wound/Skin Impairment Nursing Diagnoses: Impaired tissue integrity Goals: Ulcer/skin breakdown will heal within 14 weeks Date Initiated: 05/12/2015 Goal Status: Active Interventions: Assess ulceration(s) every visit Treatment Activities: Topical wound management initiated : 05/26/2015 Notes: Electronic Signature(s) Signed: 05/26/2015 5:15:29 PM By: Montey Hora Entered By: Montey Hora on 05/26/2015 15:33:37 Balgobin, Orson Ape ([id number]) -------------------------------------------------------------------------------- Patient/Caregiver Education Details Patient Name: [name]. Date of Service: 05/26/2015 3:00 PM Medical Record Number: [id number] Patient Account Number: [id number] Date of Birth/Gender: 07/31/1960 (54 y.o. Male) Treating RN: Montey Hora Primary Care Physician: Rutherford Nail Other Clinician: Referring Physician: Rutherford Nail Treating Physician/Extender: Frann Rider in Treatment: 2 Education Assessment Education Provided To: Patient Education Topics Provided Wound/Skin Impairment: Handouts: Other: wound care as ordered Methods: Demonstration, Explain/Verbal Responses: State content correctly Electronic Signature(s) Signed: 05/26/2015 5:15:29 PM By: Montey Hora Entered By: Montey Hora on 05/26/2015 15:43:15 Kriegel, Orson Ape ([id number]) -------------------------------------------------------------------------------- Wound Assessment Details Patient Name: [name]. Date of Service: 05/26/2015 3:00 PM Medical Record Number: [id number] Patient Account Number: [id number] Date of Birth/Sex: 09-11-1960 (54 y.o. Male) Treating RN: Montey Hora Primary Care Physician: Rutherford Nail Other Clinician: Referring Physician: Rutherford Nail Treating Physician/Extender: Frann Rider in Treatment: 2 Wound Status Wound Number: 1 Primary Diabetic Wound/Ulcer of the  Lower  Etiology: Extremity Wound Location: Right Lower Leg - Medial Wound Open Wounding Event: Trauma Status: Date Acquired: 03/30/2015 Comorbid Chronic Obstructive Pulmonary Disease Weeks Of Treatment: 2 History: (COPD), Congestive Heart Failure, Clustered Wound: No Coronary Artery Disease, Myocardial Infarction, Peripheral Arterial Disease, Peripheral Venous Disease, Type II Diabetes, Neuropathy Photos Photo Uploaded By: Montey Hora on 05/26/2015 16:48:20 Wound Measurements Length: (cm) 2.2 Width: (cm) 1.1 Depth: (cm) 0.2 Area: (cm) 1.901 Volume: (cm) 0.38 % Reduction in Area: 74.8% % Reduction in Volume: 87.4% Epithelialization: Small (1-33%) Tunneling: No Undermining: No Wound Description Classification: Grade 2 Wound Margin: Flat and Intact Exudate Amount: Medium Exudate Type: Serous Exudate Color: amber Wound Bed Granulation Amount: Small (1-33%) Exposed Structure Dodds, Orson Ape ([id number]) Granulation Quality: Pink Fascia Exposed: No Necrotic Amount: Large (67-100%) Fat Layer Exposed: No Necrotic Quality: Adherent Slough Tendon Exposed: No Muscle Exposed: No Joint Exposed: No Bone Exposed: No Limited to Skin Breakdown Periwound Skin Texture Texture Color No Abnormalities Noted: No No Abnormalities Noted: No Callus: No Atrophie Blanche: No Crepitus: No Cyanosis: No Excoriation: No Ecchymosis: No Fluctuance: No Erythema: No Friable: No Hemosiderin Staining: No Induration: No Mottled: No Localized Edema: No Pallor: No Rash: No Rubor: No Scarring: Yes Temperature / Pain Moisture Temperature: No Abnormality No Abnormalities Noted: No Tenderness on Palpation: Yes Dry / Scaly: No Maceration: No Moist: Yes Wound Preparation Ulcer Cleansing: Rinsed/Irrigated with Saline Topical Anesthetic Applied: Other: lidocaine 4%, Treatment Notes Wound #1 (Right, Medial Lower Leg) 1. Cleansed with: Clean wound with Normal Saline 2.  Anesthetic Topical Lidocaine 4% cream to wound bed prior to debridement 3. Peri-wound Care: Skin Prep 4. Dressing Applied: Hydrogel 5. Secondary Dressing Applied Bordered Foam Dressing Electronic Signature(s) Signed: 05/26/2015 5:15:29 PM By: Montey Hora Entered By: Montey Hora on 05/26/2015 15:33:30 Kyne, Orson Ape ([id number]) [address], Orson Ape ([id number]) -------------------------------------------------------------------------------- [place] Details Patient Name: [name] Date of Service: 05/26/2015 3:00 PM Medical Record Number: [id number] Patient Account Number: [id number] Date of Birth/Sex: [birth year] (53 y.o. Male) Treating RN: Montey Hora Primary Care Physician: Rutherford Nail Other Clinician: Referring Physician: Rutherford Nail Treating Physician/Extender: Frann Rider in Treatment: 2 Vital Signs Time Taken: 15:28 Temperature (F): 98.1 Height (in): 67 Pulse (bpm): 90 Weight (lbs): 248 Respiratory Rate (breaths/min): 20 Body Mass Index (BMI): 38.8 Blood Pressure (mmHg): 126/66 Reference Range: 80 - 120 mg / dl Electronic Signature(s) Signed: 05/26/2015 5:15:29 PM By: Montey Hora Entered By: Montey Hora on 05/26/2015 15:30:10

## 2015-06-04 ENCOUNTER — Encounter: Payer: No Typology Code available for payment source | Admitting: Surgery

## 2015-06-04 DIAGNOSIS — E11622 Type 2 diabetes mellitus with other skin ulcer: Secondary | ICD-10-CM | POA: Diagnosis not present

## 2015-06-06 NOTE — Progress Notes (Signed)
CECILIO, KOPKO ([id number]) Visit Report for 06/04/2015 Chief Complaint Document Details Patient Name: [name], [name]. Date of Service: 06/04/2015 3:15 PM Medical Record Number: [id number] Patient Account Number: [id number] Date of Birth/Sex: 1961/03/24 (54 y.o. Male) Treating RN: Cornell Barman Primary Care Physician: Rutherford Nail Other Clinician: Referring Physician: Rutherford Nail Treating Physician/Extender: Frann Rider in Treatment: 3 Information Obtained from: Patient Chief Complaint Patient presents to the wound care center for a consult due non healing wound he had a lacerated wound to his right lateral calf on September 5 of this year. Electronic Signature(s) Signed: 06/04/2015 3:58:51 PM By: Christin Fudge MD, FACS Entered By: Christin Fudge on 06/04/2015 15:58:51 [name], [name] ([id number]) -------------------------------------------------------------------------------- Debridement Details Patient Name: [name]. Date of Service: 06/04/2015 3:15 PM Medical Record Number: [id number] Patient Account Number: [id number] Date of Birth/Sex: 12-14-1960 (54 y.o. Male) Treating RN: Cornell Barman Primary Care Physician: Rutherford Nail Other Clinician: Referring Physician: Rutherford Nail Treating Physician/Extender: Frann Rider in Treatment: 3 Debridement Performed for Wound #1 Right,Medial Lower Leg Assessment: Performed By: Physician Christin Fudge, MD Debridement: Debridement Pre-procedure Yes Verification/Time Out Taken: Start Time: 15:46 Pain Control: Other : lidicaine 4% Level: Skin/Subcutaneous Tissue Total Area Debrided (L x 2 (cm) x 1 (cm) = 2 (cm) W): Tissue and other Viable, Non-Viable, Exudate, Fibrin/Slough, Subcutaneous material debrided: Instrument: Forceps Bleeding: None Hemostasis Achieved: Pressure End Time: 15:50 Procedural Pain: 0 Post Procedural Pain: 0 Response to Treatment: Procedure was tolerated well Post  Debridement Measurements of Total Wound Length: (cm) 2 Width: (cm) 1 Depth: (cm) 0.4 Volume: (cm) 0.628 Post Procedure Diagnosis Same as Pre-procedure Electronic Signature(s) Signed: 06/04/2015 3:58:45 PM By: Christin Fudge MD, FACS Signed: 06/05/2015 4:39:51 PM By: Gretta Cool RN, BSN, Kim RN, BSN Entered By: Christin Fudge on 06/04/2015 15:58:45 [name], [name] ([id number]) -------------------------------------------------------------------------------- HPI Details Patient Name: [name]. Date of Service: 06/04/2015 3:15 PM Medical Record Number: [id number] Patient Account Number: [id number] Date of Birth/Sex: 1960/11/07 (54 y.o. Male) Treating RN: Cornell Barman Primary Care Physician: Rutherford Nail Other Clinician: Referring Physician: Rutherford Nail Treating Physician/Extender: Frann Rider in Treatment: 3 History of Present Illness Location: injury to the right lateral calf 03/30/2015 Quality: Patient reports experiencing a sharp pain to affected area(s). Severity: Patient states wound (s) are getting better. Duration: Patient has had the wound for < 5 weeks prior to presenting for treatment Timing: Pain in wound is constant (hurts all the time) Context: The wound occurred when the patient injured himself with a sharp object and he had a lacerated wound Modifying Factors: Consults to this date include: 2 rounds of antibiotics which include doxycycline and ciprofloxacin. Associated Signs and Symptoms: Patient reports having difficulty standing for long periods. HPI Description: 54 year old gentleman who is known to have diabetes mellitus, hyperlipidemia, chronic pain syndrome, long-term use of opiate , peripheral artery disease and has had stents placed in lower extremities by Dr. Nicoletta Dress in 2014, carotid stenosis, coronary artery disease, status post CABG, CHF, COPD, atrial fibrillation, obesity, and cardiomyopathy. He reports that he had a wound on his right leg  for which he was put on Cipro and doxycycline. He quit smoking about 18 months ago and prior to that smoked about a pack of cigarettes a day. x-ray of his leg done on 03/30/2015 showed no acute bony abnormality but he did have soft tissue calcification. Review of his arterial procedure notes revealed that on 05/14/2013 he had an abdominal aortogram with a right lower extremity runoff, angioplasty of the right  common femoral artery, angioplasty of the right common iliac artery and angioplasty of the left external iliac artery by Dr. Delana Meyer. 05/19/2015 -- was seen by Dr. Hortencia Pilar on 04/25/2015 and his advice was due to the fact that the patient has increasing symptoms and now having claudication and rest pain he has recommended angiography and intervention. the patient however has not setup his appointment for the stent and I have urged him to do this as soon as possible. 06/04/2015 -- he went back to see Dr. Delana Meyer and after review he has been scheduled for a angiography and intervention this coming Tuesday. Electronic Signature(s) Signed: 06/04/2015 4:01:06 PM By: Christin Fudge MD, FACS Previous Signature: 06/04/2015 4:00:21 PM Version By: Christin Fudge MD, FACS Entered By: Christin Fudge on 06/04/2015 16:01:06 [name], [name] ([id number]) -------------------------------------------------------------------------------- Physical Exam Details Patient Name: [name] Date of Service: 06/04/2015 3:15 PM Medical Record Number: [id number] Patient Account Number: [id number] Date of Birth/Sex: [birth year] (54 y.o. Male) Treating RN: Cornell Barman Primary Care Physician: Rutherford Nail Other Clinician: Referring Physician: Rutherford Nail Treating Physician/Extender: Frann Rider in Treatment: 3 Constitutional . Pulse regular. Respirations normal and unlabored. Afebrile. . Eyes Nonicteric. Reactive to light. Ears, Nose, Mouth, and Throat Lips, teeth, and gums WNL.Marland Kitchen  Moist mucosa without lesions . Neck supple and nontender. No palpable supraclavicular or cervical adenopathy. Normal sized without goiter. Respiratory WNL. No retractions.. Cardiovascular Pedal Pulses WNL. No clubbing, cyanosis or edema. Lymphatic No adneopathy. No adenopathy. No adenopathy. Musculoskeletal Adexa without tenderness or enlargement.. Digits and nails w/o clubbing, cyanosis, infection, petechiae, ischemia, or inflammatory conditions.. Integumentary (Hair, Skin) No suspicious lesions. No crepitus or fluctuance. No peri-wound warmth or erythema. No masses.Marland Kitchen Psychiatric Judgement and insight Intact.. No evidence of depression, anxiety, or agitation.. Notes he continues to have some slough superficially and in the subcutaneous tissue and I have sharply removed this without causing any enlargement of the wound or surrounding tissue. Electronic Signature(s) Signed: 06/04/2015 4:01:51 PM By: Christin Fudge MD, FACS Entered By: Christin Fudge on 06/04/2015 16:01:50 [name], [name] ([id number]) -------------------------------------------------------------------------------- Physician Orders Details Patient Name: [name]. Date of Service: 06/04/2015 3:15 PM Medical Record Number: [id number] Patient Account Number: [id number] Date of Birth/Sex: 06/20/1961 (54 y.o. Male) Treating RN: Cornell Barman Primary Care Physician: Rutherford Nail Other Clinician: Referring Physician: Rutherford Nail Treating Physician/Extender: Frann Rider in Treatment: 3 Verbal / Phone Orders: Yes Clinician: Cornell Barman Read Back and Verified: Yes Diagnosis Coding Wound Cleansing Wound #1 Right,Medial Lower Leg o Clean wound with Normal Saline. o Cleanse wound with mild soap and water o May Shower, gently pat wound dry prior to applying new dressing. Anesthetic Wound #1 Right,Medial Lower Leg o Topical Lidocaine 4% cream applied to wound bed prior to debridement Primary  Wound Dressing Wound #1 Right,Medial Lower Leg o Other: - Hydrogel Ag Secondary Dressing Wound #1 Right,Medial Lower Leg o Boardered Foam Dressing Dressing Change Frequency Wound #1 Right,Medial Lower Leg o Change dressing every day. Follow-up Appointments Wound #1 Right,Medial Lower Leg o Return Appointment in 1 week. Edema Control Wound #1 Right,Medial Lower Leg o Elevate legs to the level of the heart and pump ankles as often as possible Additional Orders / Instructions Wound #1 Right,Medial Lower Leg o Increase protein intake. o Other: - get diabetes under control [name], [name] ([id number]) Electronic Signature(s) Signed: 06/04/2015 4:04:17 PM By: Christin Fudge MD, FACS Signed: 06/05/2015 4:39:51 PM By: Gretta Cool RN, BSN, Kim RN, BSN Entered By: Gretta Cool, RN, BSN, Kim on  06/04/2015 15:48:38 [name], [name] ([id number]) -------------------------------------------------------------------------------- Problem List Details Patient Name: ZECHARIAH, STINSON. Date of Service: 06/04/2015 3:15 PM Medical Record Number: [id number] Patient Account Number: [id number] Date of Birth/Sex: Jun 17, 1961 (54 y.o. 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 Date of Service: 06/04/2015 3:15 PM Medical Record Number: [id number] Patient Account Number: [id number] Date of Birth/Sex: [birth year] (54 y.o. Male) Treating RN: Cornell Barman Primary Care Physician: Rutherford Nail Other Clinician: Referring Physician: Rutherford Nail Treating Physician/Extender: Frann Rider in Treatment: 3 Subjective Chief Complaint Information obtained from Patient Patient presents to the wound care center for a consult due non healing wound he had a lacerated wound to his right lateral calf on September 5 of this year. History of Present Illness (HPI) The following HPI elements were documented for the patient's wound: Location: injury to the right lateral calf 03/30/2015 Quality: Patient reports experiencing a sharp pain to affected area(s). Severity: Patient states wound (s) are getting better. Duration: Patient has had the wound for < 5 weeks prior to presenting for treatment Timing: Pain in wound is constant (hurts all the time) Context: The wound occurred when the patient injured himself with a sharp object and he had a lacerated wound Modifying Factors: Consults to this date include: 2 rounds of antibiotics which include doxycycline and ciprofloxacin. Associated Signs and Symptoms: Patient reports having difficulty standing for long periods. 54 year old gentleman who is known to have diabetes mellitus, hyperlipidemia, chronic pain syndrome, long-term use of opiate , peripheral artery disease and has had stents placed in lower extremities by Dr. Nicoletta Dress in 2014, carotid stenosis, coronary artery disease, status post CABG, CHF, COPD, atrial fibrillation, obesity, and cardiomyopathy. He reports that he had a wound on his right leg for which he was put on Cipro and doxycycline. He quit smoking about 18 months  ago and prior to that smoked about a pack of cigarettes a day. x-ray of his leg done on 03/30/2015 showed no acute bony abnormality but he did have soft tissue calcification. Review of his arterial procedure notes revealed that on 05/14/2013 he had an abdominal aortogram with a right lower extremity runoff, angioplasty of the right common femoral artery, angioplasty of the right common iliac artery and angioplasty of the left external iliac artery by Dr. Delana Meyer. 05/19/2015 -- was seen by Dr. Hortencia Pilar on 04/25/2015 and his advice was due to the fact that the patient has increasing symptoms and now having claudication and rest pain he has recommended angiography and intervention. the patient however has not setup his appointment for the stent and I have urged him to do this as soon as possible. 06/04/2015 -- he went back to see Dr. Delana Meyer and after review he has been scheduled for a angiography and intervention this coming Tuesday. [name], [name] ([id number]) Objective Constitutional Pulse regular. Respirations normal and unlabored. Afebrile. Vitals Time Taken: 3:37 PM, Height: 67 in, Weight: 248  lbs, BMI: 38.8, Temperature: 97.5 F, Pulse: 96 bpm, Respiratory Rate: 18 breaths/min, Blood Pressure: 143/61 mmHg. Eyes Nonicteric. Reactive to light. Ears, Nose, Mouth, and Throat Lips, teeth, and gums WNL.Marland Kitchen Moist mucosa without lesions . Neck supple and nontender. No palpable supraclavicular or cervical adenopathy. Normal sized without goiter. Respiratory WNL. No retractions.. Cardiovascular Pedal Pulses WNL. No clubbing, cyanosis or edema. Lymphatic No adneopathy. No adenopathy. No adenopathy. Musculoskeletal Adexa without tenderness or enlargement.. Digits and nails w/o clubbing, cyanosis, infection, petechiae, ischemia, or inflammatory conditions.Marland Kitchen Psychiatric Judgement and insight Intact.. No evidence of depression, anxiety, or agitation.. General Notes: he continues to  have some slough superficially and in the subcutaneous tissue and I have sharply removed this without causing any enlargement of the wound or surrounding tissue. Integumentary (Hair, Skin) No suspicious lesions. No crepitus or fluctuance. No peri-wound warmth or erythema. No masses.. Wound #1 status is Open. Original cause of wound was Trauma. The wound is located on the Right,Medial Lower Leg. The wound measures 2cm length x 1cm width x 0.2cm depth; 1.571cm^2 area and 0.314cm^3 volume. The wound is limited to skin breakdown. There is a medium amount of serous drainage noted. The [name], [name] ([id number]) wound margin is flat and intact. There is small (1-33%) pink granulation within the wound bed. There is a large (67-100%) amount of necrotic tissue within the wound bed including Adherent Slough. The periwound skin appearance exhibited: Scarring, Moist. The periwound skin appearance did not exhibit: Callus, Crepitus, Excoriation, Fluctuance, Friable, Induration, Localized Edema, Rash, Dry/Scaly, Maceration, Atrophie Blanche, Cyanosis, Ecchymosis, Hemosiderin Staining, Mottled, Pallor, Rubor, Erythema. Periwound temperature was noted as No Abnormality. The periwound has tenderness on palpation. 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 There was a Skin/Subcutaneous Tissue Debridement [id number]) debridement with total area of 2 sq cm performed by Christin Fudge, MD. with the following instrument(s): Forceps to remove Viable and Non-Viable tissue/material including Exudate, Fibrin/Slough, and Subcutaneous after achieving pain control  using Other (lidicaine 4%). A time out was conducted prior to the start of the procedure. There was no bleeding. The procedure was tolerated well with a pain level of 0 throughout and a pain level of 0 following the procedure. Post Debridement Measurements: 2cm length x 1cm width x 0.4cm depth; 0.628cm^3 volume. Post procedure Diagnosis Wound #1: Same as Pre-Procedure Plan Wound Cleansing: Wound #1 Right,Medial Lower Leg: Clean wound with Normal Saline. Cleanse wound with mild soap and water May Shower, gently pat wound dry prior to applying new dressing. Anesthetic: [name], [name] ([id number]) Wound #1 Right,Medial Lower Leg: Topical Lidocaine 4% cream applied to wound bed prior to debridement Primary Wound Dressing: Wound #1 Right,Medial Lower Leg: Other: - Hydrogel Ag Secondary Dressing: Wound #1 Right,Medial Lower Leg: Boardered Foam Dressing Dressing Change Frequency: Wound #1 Right,Medial Lower Leg: Change dressing every day. Follow-up Appointments: Wound #1 Right,Medial Lower Leg: Return Appointment in 1 week. Edema Control: Wound #1 Right,Medial Lower Leg: Elevate legs to the level of the heart and pump ankles as often as possible Additional Orders / Instructions: Wound #1 Right,Medial Lower Leg: Increase protein intake. Other: - get diabetes under control After cleaning it out we'll continue with hydrogel with silver and let  the wound gradually get cleaner with autolytic debridement. He has a surgical date with the vascular surgeon on this coming Tuesday. We will see him back for review next week. Electronic Signature(s) Signed: 06/04/2015 4:03:38 PM By: Christin Fudge MD, FACS Entered By: Christin Fudge on 06/04/2015 16:03:38 [name], [name] ([id number]) -------------------------------------------------------------------------------- SuperBill Details Patient Name: [name] Date of Service: 06/04/2015 Medical Record Number: [id number] Patient Account  Number: [id number] Date of Birth/Sex: 1961-07-02 (54 y.o. 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 I70.532 of calf Facility Procedures CPT4: Description Modifier Quantity Code [id number] - DEB SUBQ TISSUE 20 SQ CM/< 1 ICD-10 Description Diagnosis E11.622 Type 2 diabetes mellitus with other skin ulcer S81.811A Laceration without foreign body, right lower leg, initial encounter  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

## 2015-06-06 NOTE — Progress Notes (Signed)
[name], [name] ([id number]) Visit Report for 06/04/2015 Arrival Information Details Patient Name: [name], [name]. Date of Service: 06/04/2015 3:15 PM Medical Record Number: [id number] Patient Account Number: [id number] Date of Birth/Sex: 1960/11/04 (54 y.o. Male) Treating RN: Cornell Barman Primary Care Physician: Rutherford Nail Other Clinician: Referring Physician: Rutherford Nail Treating Physician/Extender: Frann Rider in Treatment: 3 Visit Information History Since Last Visit Added or deleted any medications: No Patient Arrived: Ambulatory Any new allergies or adverse reactions: No Arrival Time: 15:36 Had a fall or experienced change in No Accompanied By: self activities of daily living that may affect Transfer Assistance: None risk of falls: Patient Identification Verified: Yes Signs or symptoms of abuse/neglect since last No Secondary Verification Process Yes visito Completed: Hospitalized since last visit: No Patient Requires Transmission- No Has Dressing in Place as Prescribed: Yes Based Precautions: Pain Present Now: No Patient Has Alerts: Yes Patient Alerts: Patient on Blood Thinner Type II Diabetic Electronic Signature(s) Signed: 06/05/2015 4:39:51 PM By: Gretta Cool, RN, BSN, Kim RN, BSN Entered By: Gretta Cool, RN, BSN, Kim on 06/04/2015 15:36:18 [name], [name] ([id number]) -------------------------------------------------------------------------------- Encounter Discharge Information Details Patient Name: [name]. Date of Service: 06/04/2015 3:15 PM Medical Record Number: [id number] Patient Account Number: [id number] Date of Birth/Sex: [birth year] (54 y.o. Male) Treating RN: Cornell Barman Primary Care Physician: Rutherford Nail Other Clinician: Referring Physician: Rutherford Nail Treating Physician/Extender: Frann Rider in Treatment: 3 Encounter Discharge Information Items Discharge Pain Level: 0 Discharge Condition: Stable Ambulatory  Status: Ambulatory Discharge Destination: Home Transportation: Private Auto Accompanied By: self Schedule Follow-up Appointment: Yes Medication Reconciliation completed and provided to Patient/Care Yes [name]: Provided on Clinical Summary of Care: 06/04/2015 Form Type Recipient Paper Patient [employer] Electronic Signature(s) Signed: 06/04/2015 3:55:54 PM By: Ruthine Dose Entered By: Ruthine Dose on 06/04/2015 15:55:54 Agent, [name] ([id number]) -------------------------------------------------------------------------------- Lower Extremity Assessment Details Patient Name: [name]. Date of Service: 06/04/2015 3:15 PM Medical Record Number: [id number] Patient Account Number: [id number] Date of Birth/Sex: 1961-03-30 (54 y.o. Male) Treating RN: Cornell Barman Primary Care Physician: Rutherford Nail Other Clinician: Referring Physician: Rutherford Nail Treating Physician/Extender: Frann Rider in Treatment: 3 Vascular Assessment Pulses: Posterior Tibial Palpable: [Right:Yes] Dorsalis Pedis Palpable: [Right:Yes] Extremity colors, hair growth, and conditions: Extremity Color: [Right:Normal] Hair Growth on Extremity: [Right:Yes] Temperature of Extremity: [Right:Warm] Capillary Refill: [Right:< 3 seconds] Toe Nail Assessment Left: Right: Thick: No Discolored: No Deformed: No Improper Length and Hygiene: No Electronic Signature(s) Signed: 06/05/2015 4:39:51 PM By: Gretta Cool, RN, BSN, Kim RN, BSN Entered By: Gretta Cool, RN, BSN, Kim on 06/04/2015 15:39:23 [name], [name] ([id number]) -------------------------------------------------------------------------------- Multi Wound Chart Details Patient Name: [name]. Date of Service: 06/04/2015 3:15 PM Medical Record Number: [id number] Patient Account Number: [id number] Date of Birth/Sex: 04-24-1961 (54 y.o. Male) Treating RN: Cornell Barman Primary Care Physician: Rutherford Nail Other Clinician: Referring  Physician: Rutherford Nail Treating Physician/Extender: Frann Rider in Treatment: 3 Vital Signs Height(in): 67 Pulse(bpm): 96 Weight(lbs): 248 Blood Pressure 143/61 (mmHg): Body Mass Index(BMI): 39 Temperature(F): 97.5 Respiratory Rate 18 (breaths/min): Photos: [1:No Photos] [N/A:N/A] Wound Location: [1:Right Lower Leg - Medial] [N/A:N/A] Wounding Event: [1:Trauma] [N/A:N/A] Primary Etiology: [1:Diabetic Wound/Ulcer of the Lower Extremity] [N/A:N/A] Comorbid History: [1:Chronic Obstructive Pulmonary Disease (COPD), Congestive Heart Failure, Coronary Artery Disease, Myocardial Infarction, Peripheral Arterial Disease, Peripheral Venous Disease, Type II Diabetes, Neuropathy] [N/A:N/A] Date Acquired: [1:03/30/2015] [N/A:N/A] Weeks of Treatment: [1:3] [N/A:N/A] Wound Status: [1:Open] [N/A:N/A] Measurements L x W x D 2x1x0.2 [N/A:N/A] (cm) Area (cm) : [1:1.571] [N/A:N/A] Volume (cm) : [  1:0.314] [N/A:N/A] % Reduction in Area: [1:79.20%] [N/A:N/A] % Reduction in Volume: 89.60% [N/A:N/A] Classification: [1:Grade 2] [N/A:N/A] Exudate Amount: [1:Medium] [N/A:N/A] Exudate Type: [1:Serous] [N/A:N/A] Exudate Color: [1:amber] [N/A:N/A] Wound Margin: [1:Flat and Intact] [N/A:N/A] Granulation Amount: [1:Small (1-33%)] [N/A:N/A] Granulation Quality: Pink N/A N/A Necrotic Amount: Large (67-100%) N/A N/A Exposed Structures: Fascia: No N/A N/A Fat: No Tendon: No Muscle: No Joint: No Bone: No Limited to Skin Breakdown Epithelialization: Small (1-33%) N/A N/A Periwound Skin Texture: Scarring: Yes N/A N/A Edema: No Excoriation: No Induration: No Callus: No Crepitus: No Fluctuance: No Friable: No Rash: No Periwound Skin Moist: Yes N/A N/A Moisture: Maceration: No Dry/Scaly: No Periwound Skin Color: Atrophie Blanche: No N/A N/A Cyanosis: No Ecchymosis: No Erythema: No Hemosiderin Staining: No Mottled: No Pallor: No Rubor: No Temperature: No Abnormality N/A  N/A Tenderness on Yes N/A N/A Palpation: Wound Preparation: Ulcer Cleansing: N/A N/A Rinsed/Irrigated with Saline Topical Anesthetic Applied: Other: lidocaine 4% Treatment Notes Electronic Signature(s) Signed: 06/05/2015 4:39:51 PM By: Gretta Cool, RN, BSN, Kim RN, BSN Entered By: Gretta Cool, RN, BSN, Kim on 06/04/2015 15:44:25 [name], [name] ([id number]) -------------------------------------------------------------------------------- [place] Details Patient Name: [name], [name]. Date of Service: 06/04/2015 3:15 PM Medical Record Number: [id number] Patient Account Number: [id number] Date of Birth/Sex: [birth year] (54 y.o. Male) Treating RN: Cornell Barman Primary Care Physician: Rutherford Nail Other Clinician: Referring Physician: Rutherford Nail Treating Physician/Extender: Frann Rider in Treatment: 3 Active Inactive Abuse / Safety / Falls / Self Care Management Nursing Diagnoses: Potential for falls Self care deficit: actual or potential Goals: Patient will remain injury free Date Initiated: 05/12/2015 Goal Status: Active Interventions: Assess fall risk on admission and as needed Notes: Orientation to the Wound Care Program Nursing Diagnoses: Knowledge deficit related to the wound healing center program Goals: Patient/caregiver will verbalize understanding of the [place] Program Date Initiated: 05/12/2015 Goal Status: Active Interventions: Provide education on orientation to the wound center Notes: Soft Tissue Infection Nursing Diagnoses: Potential for infection: soft tissue Goals: Patient will remain free of wound infection [name], [name] ([id number]) Date Initiated: 05/12/2015 Goal Status: Active Signs and symptoms of infection will be recognized early to allow for prompt treatment Date Initiated: 05/12/2015 Goal Status: Active Interventions: Assess signs and symptoms of infection every visit Treatment  Activities: Systemic antibiotics : 06/04/2015 Notes: Wound/Skin Impairment Nursing Diagnoses: Impaired tissue integrity Goals: Ulcer/skin breakdown will heal within 14 weeks Date Initiated: 05/12/2015 Goal Status: Active Interventions: Assess ulceration(s) every visit Treatment Activities: Topical wound management initiated : 06/04/2015 Notes: Electronic Signature(s) Signed: 06/05/2015 4:39:51 PM By: Gretta Cool, RN, BSN, Kim RN, BSN Entered By: Gretta Cool, RN, BSN, Kim on 06/04/2015 15:44:19 [name], [name] ([id number]) -------------------------------------------------------------------------------- Pain Assessment Details Patient Name: [name]. Date of Service: 06/04/2015 3:15 PM Medical Record Number: [id number] Patient Account Number: [id number] Date of Birth/Sex: 04/20/1961 (54 y.o. Male) Treating RN: Cornell Barman Primary Care Physician: Rutherford Nail Other Clinician: Referring Physician: Rutherford Nail Treating Physician/Extender: Frann Rider in Treatment: 3 Active Problems Location of Pain Severity and Description of Pain Patient Has Paino No Site Locations Pain Management and Medication Current Pain Management: Electronic Signature(s) Signed: 06/05/2015 4:39:51 PM By: Gretta Cool, RN, BSN, Kim RN, BSN Entered By: Gretta Cool, RN, BSN, Kim on 06/04/2015 15:36:23 [name], [name] ([id number]) -------------------------------------------------------------------------------- Patient/Caregiver Education Details Patient Name: [name] Date of Service: 06/04/2015 3:15 PM Medical Record Number: [id number] Patient Account Number: [id number] Date of Birth/Gender: [birth year] (54 y.o. Male) Treating RN: Cornell Barman Primary Care Physician: Rutherford Nail Other Clinician:  Referring Physician: Rutherford Nail Treating Physician/Extender: Frann Rider in Treatment: 3 Education Assessment Education Provided To: Patient Education Topics Provided Wound/Skin  Impairment: Handouts: Caring for Your Ulcer, Other: wound are as prescribed Methods: Demonstration, Explain/Verbal Responses: State content correctly Electronic Signature(s) Signed: 06/05/2015 4:39:51 PM By: Gretta Cool, RN, BSN, Kim RN, BSN Entered By: Gretta Cool, RN, BSN, Kim on 06/04/2015 15:50:53 [name], [name] ([id number]) -------------------------------------------------------------------------------- Wound Assessment Details Patient Name: [name]. Date of Service: 06/04/2015 3:15 PM Medical Record Number: [id number] Patient Account Number: [id number] Date of Birth/Sex: 09/21/1960 (54 y.o. Male) Treating RN: Cornell Barman Primary Care Physician: Rutherford Nail Other Clinician: Referring Physician: Rutherford Nail Treating Physician/Extender: Frann Rider in Treatment: 3 Wound Status Wound Number: 1 Primary Diabetic Wound/Ulcer of the Lower Etiology: Extremity Wound Location: Right Lower Leg - Medial Wound Open Wounding Event: Trauma Status: Date Acquired: 03/30/2015 Comorbid Chronic Obstructive Pulmonary Disease Weeks Of Treatment: 3 History: (COPD), Congestive Heart Failure, Clustered Wound: No Coronary Artery Disease, Myocardial Infarction, Peripheral Arterial Disease, Peripheral Venous Disease, Type II Diabetes, Neuropathy Photos Photo Uploaded By: Gretta Cool, RN, BSN, Kim on 06/04/2015 16:03:27 Wound Measurements Length: (cm) 2 Width: (cm) 1 Depth: (cm) 0.2 Area: (cm) 1.571 Volume: (cm) 0.314 % Reduction in Area: 79.2% % Reduction in Volume: 89.6% Epithelialization: Small (1-33%) Wound Description Classification: Grade 2 Wound Margin: Flat and Intact Exudate Amount: Medium Exudate Type: Serous Exudate Color: amber Wound Bed Granulation Amount: Small (1-33%) Exposed Structure [name], [name] ([id number]) Granulation Quality: Pink Fascia Exposed: No Necrotic Amount: Large (67-100%) Fat Layer Exposed: No Necrotic Quality: Adherent Slough Tendon  Exposed: No Muscle Exposed: No Joint Exposed: No Bone Exposed: No Limited to Skin Breakdown Periwound Skin Texture Texture Color No Abnormalities Noted: No No Abnormalities Noted: No Callus: No Atrophie Blanche: No Crepitus: No Cyanosis: No Excoriation: No Ecchymosis: No Fluctuance: No Erythema: No Friable: No Hemosiderin Staining: No Induration: No Mottled: No Localized Edema: No Pallor: No Rash: No Rubor: No Scarring: Yes Temperature / Pain Moisture Temperature: No Abnormality No Abnormalities Noted: No Tenderness on Palpation: Yes Dry / Scaly: No Maceration: No Moist: Yes Wound Preparation Ulcer Cleansing: Rinsed/Irrigated with Saline Topical Anesthetic Applied: Other: lidocaine 4%, Treatment Notes Wound #1 (Right, Medial Lower Leg) 1. Cleansed with: Clean wound with Normal Saline 2. Anesthetic Topical Lidocaine 4% cream to wound bed prior to debridement 4. Dressing Applied: Other dressing (specify in notes) 5. Secondary Dressing Applied Bordered Foam Dressing Notes hydrogel ag Electronic Signature(s) Signed: 06/05/2015 4:39:51 PM By: Gretta Cool, RN, BSN, Kim RN, BSN Entered By: Gretta Cool, RN, BSN, Kim on 06/04/2015 15:40:03 [name], [name] ([id number]) [name], [name] ([id number]) -------------------------------------------------------------------------------- [place] Details Patient Name: [name] Date of Service: 06/04/2015 3:15 PM Medical Record Number: [id number] Patient Account Number: [id number] Date of Birth/Sex: 1961/04/07 (54 y.o. Male) Treating RN: Cornell Barman Primary Care Physician: Rutherford Nail Other Clinician: Referring Physician: Rutherford Nail Treating Physician/Extender: Frann Rider in Treatment: 3 Vital Signs Time Taken: 15:37 Temperature (F): 97.5 Height (in): 67 Pulse (bpm): 96 Weight (lbs): 248 Respiratory Rate (breaths/min): 18 Body Mass Index (BMI): 38.8 Blood Pressure (mmHg): 143/61 Reference Range: 80  - 120 mg / dl Electronic Signature(s) Signed: 06/05/2015 4:39:51 PM By: Gretta Cool, RN, BSN, Kim RN, BSN Entered By: Gretta Cool, RN, BSN, Kim on 06/04/2015 15:38:44

## 2015-06-08 ENCOUNTER — Other Ambulatory Visit: Payer: Self-pay | Admitting: Vascular Surgery

## 2015-06-09 ENCOUNTER — Encounter: Payer: Self-pay | Admitting: *Deleted

## 2015-06-09 ENCOUNTER — Ambulatory Visit
Admission: RE | Admit: 2015-06-09 | Discharge: 2015-06-09 | Disposition: A | Discharge status: Home / Self Care | Payer: No Typology Code available for payment source | Source: Ambulatory Visit | Attending: Vascular Surgery | Admitting: Vascular Surgery

## 2015-06-09 ENCOUNTER — Encounter
Admission: RE | Disposition: A | Discharge status: Home / Self Care | Payer: Self-pay | Source: Ambulatory Visit | Attending: Vascular Surgery

## 2015-06-09 DIAGNOSIS — Z951 Presence of aortocoronary bypass graft: Secondary | ICD-10-CM | POA: Insufficient documentation

## 2015-06-09 DIAGNOSIS — I6529 Occlusion and stenosis of unspecified carotid artery: Secondary | ICD-10-CM | POA: Insufficient documentation

## 2015-06-09 DIAGNOSIS — I70223 Atherosclerosis of native arteries of extremities with rest pain, bilateral legs: Secondary | ICD-10-CM | POA: Diagnosis present

## 2015-06-09 DIAGNOSIS — I2510 Atherosclerotic heart disease of native coronary artery without angina pectoris: Secondary | ICD-10-CM | POA: Diagnosis not present

## 2015-06-09 DIAGNOSIS — G589 Mononeuropathy, unspecified: Secondary | ICD-10-CM | POA: Diagnosis not present

## 2015-06-09 DIAGNOSIS — I868 Varicose veins of other specified sites: Secondary | ICD-10-CM | POA: Diagnosis not present

## 2015-06-09 DIAGNOSIS — Z79899 Other long term (current) drug therapy: Secondary | ICD-10-CM | POA: Insufficient documentation

## 2015-06-09 DIAGNOSIS — Z6841 Body Mass Index (BMI) 40.0 and over, adult: Secondary | ICD-10-CM | POA: Diagnosis not present

## 2015-06-09 DIAGNOSIS — F17200 Nicotine dependence, unspecified, uncomplicated: Secondary | ICD-10-CM | POA: Insufficient documentation

## 2015-06-09 DIAGNOSIS — Z794 Long term (current) use of insulin: Secondary | ICD-10-CM | POA: Diagnosis not present

## 2015-06-09 DIAGNOSIS — E119 Type 2 diabetes mellitus without complications: Secondary | ICD-10-CM | POA: Insufficient documentation

## 2015-06-09 HISTORY — PX: PERIPHERAL VASCULAR CATHETERIZATION: SHX172C

## 2015-06-09 LAB — BASIC METABOLIC PANEL
Anion gap: 6 (ref 5–15)
BUN: 26 mg/dL — AB (ref 6–20)
CHLORIDE: 103 mmol/L (ref 101–111)
CO2: 25 mmol/L (ref 22–32)
Calcium: 9.9 mg/dL (ref 8.9–10.3)
Creatinine, Ser: 0.87 mg/dL (ref 0.61–1.24)
Glucose, Bld: 197 mg/dL — ABNORMAL HIGH (ref 65–99)
Potassium: 4.7 mmol/L (ref 3.5–5.1)
SODIUM: 134 mmol/L — AB (ref 135–145)

## 2015-06-09 SURGERY — ABDOMINAL AORTOGRAM W/LOWER EXTREMITY
Laterality: Right | Wound class: Clean

## 2015-06-09 MED ORDER — HEPARIN SODIUM (PORCINE) 1000 UNIT/ML IJ SOLN
INTRAMUSCULAR | Status: DC | PRN
  Administered 2015-06-09: 3000 [IU] via INTRAVENOUS
  Administered 2015-06-09: 1000 [IU] via INTRAVENOUS

## 2015-06-09 MED ORDER — ACETAMINOPHEN 325 MG PO TABS: 325.0000 mg | ORAL_TABLET | ORAL | Status: DC | PRN

## 2015-06-09 MED ORDER — LIDOCAINE HCL (PF) 1 % IJ SOLN
INTRAMUSCULAR | Status: DC | PRN
  Administered 2015-06-09: 5 mL via INTRADERMAL

## 2015-06-09 MED ORDER — CLOPIDOGREL BISULFATE 75 MG PO TABS: 75.0000 mg | ORAL_TABLET | Freq: Every day | ORAL | Status: DC

## 2015-06-09 MED ORDER — HYDROMORPHONE HCL 1 MG/ML IJ SOLN
0.5000 mg | INTRAMUSCULAR | Status: DC | PRN
  Administered 2015-06-09: 1 mg via INTRAVENOUS

## 2015-06-09 MED ORDER — DEXTROSE 5 % IV SOLN
1.5000 g | INTRAVENOUS | Status: AC
  Administered 2015-06-09: 1.5 g via INTRAVENOUS
  Filled 2015-06-09: qty 1.5

## 2015-06-09 MED ORDER — ONDANSETRON HCL 4 MG/2ML IJ SOLN: 4.0000 mg | Freq: Four times a day (QID) | INTRAMUSCULAR | Status: DC | PRN

## 2015-06-09 MED ORDER — FENTANYL CITRATE (PF) 100 MCG/2ML IJ SOLN
INTRAMUSCULAR | Status: AC
  Filled 2015-06-09: qty 2

## 2015-06-09 MED ORDER — LIDOCAINE HCL (PF) 1 % IJ SOLN
INTRAMUSCULAR | Status: AC
  Filled 2015-06-09: qty 10

## 2015-06-09 MED ORDER — CLOPIDOGREL BISULFATE 75 MG PO TABS
ORAL_TABLET | ORAL | Status: AC
  Filled 2015-06-09: qty 2

## 2015-06-09 MED ORDER — HYDROMORPHONE HCL 1 MG/ML IJ SOLN
INTRAMUSCULAR | Status: AC
  Administered 2015-06-09: 1 mg via INTRAVENOUS
  Filled 2015-06-09: qty 1

## 2015-06-09 MED ORDER — HEPARIN SODIUM (PORCINE) 1000 UNIT/ML IJ SOLN
INTRAMUSCULAR | Status: AC
  Filled 2015-06-09: qty 1

## 2015-06-09 MED ORDER — LIDOCAINE HCL (PF) 1 % IJ SOLN
INTRAMUSCULAR | Status: AC
  Filled 2015-06-09: qty 30

## 2015-06-09 MED ORDER — CLOPIDOGREL BISULFATE 75 MG PO TABS
300.0000 mg | ORAL_TABLET | Freq: Once | ORAL | Status: AC
  Administered 2015-06-09: 300 mg via ORAL

## 2015-06-09 MED ORDER — OXYCODONE HCL 5 MG PO TABS: 5.0000 mg | ORAL_TABLET | ORAL | Status: DC | PRN

## 2015-06-09 MED ORDER — IOHEXOL 300 MG/ML  SOLN
INTRAMUSCULAR | Status: DC | PRN
  Administered 2015-06-09: 80 mL via INTRA_ARTERIAL

## 2015-06-09 MED ORDER — MIDAZOLAM HCL 5 MG/5ML IJ SOLN
INTRAMUSCULAR | Status: AC
  Filled 2015-06-09: qty 5

## 2015-06-09 MED ORDER — MIDAZOLAM HCL 2 MG/2ML IJ SOLN
INTRAMUSCULAR | Status: AC
  Filled 2015-06-09: qty 2

## 2015-06-09 MED ORDER — FENTANYL CITRATE (PF) 100 MCG/2ML IJ SOLN
INTRAMUSCULAR | Status: DC | PRN
  Administered 2015-06-09 (×5): 50 ug via INTRAVENOUS

## 2015-06-09 MED ORDER — ALUM & MAG HYDROXIDE-SIMETH 200-200-20 MG/5ML PO SUSP: 15.0000 mL | ORAL | Status: DC | PRN

## 2015-06-09 MED ORDER — MIDAZOLAM HCL 2 MG/2ML IJ SOLN
INTRAMUSCULAR | Status: DC | PRN
  Administered 2015-06-09: 2 mg via INTRAVENOUS
  Administered 2015-06-09: 1 mg via INTRAVENOUS
  Administered 2015-06-09: 2 mg via INTRAVENOUS
  Administered 2015-06-09 (×2): 1 mg via INTRAVENOUS

## 2015-06-09 MED ORDER — SODIUM CHLORIDE 0.9 % IV SOLN
INTRAVENOUS | Status: DC
  Administered 2015-06-09: 09:00:00 via INTRAVENOUS

## 2015-06-09 MED ORDER — HEPARIN (PORCINE) IN NACL 2-0.9 UNIT/ML-% IJ SOLN
INTRAMUSCULAR | Status: AC
  Filled 2015-06-09: qty 1000

## 2015-06-09 MED ORDER — ACETAMINOPHEN 325 MG RE SUPP: 325.0000 mg | RECTAL | Status: DC | PRN

## 2015-06-09 SURGICAL SUPPLY — 21 items
BALLN LUTONIX DCB 6X40X130 (BALLOONS) ×5
BALLN ULTRVRSE 5X40X130C (BALLOONS) ×5
BALLOON LUTONIX DCB 6X40X130 (BALLOONS) ×3 IMPLANT
BALLOON ULTRVRSE 5X40X130C (BALLOONS) ×3 IMPLANT
CANNULA 5F STIFF (CANNULA) ×5 IMPLANT
CATH CXI 4F 90 DAV (MICROCATHETER) ×5 IMPLANT
CATH PIG 70CM (CATHETERS) ×10 IMPLANT
CATH RIM 65CM (CATHETERS) ×5 IMPLANT
CATH VERT 100CM (CATHETERS) ×5 IMPLANT
DEVICE CLOSURE MYNXGRIP 6/7F (Vascular Products) ×5 IMPLANT
DEVICE PRESTO INFLATION (MISCELLANEOUS) ×5 IMPLANT
GLIDEWIRE ADV .035X260CM (WIRE) ×5 IMPLANT
GUIDEWIRE SUPER STIFF .035X180 (WIRE) ×5 IMPLANT
PACK ANGIOGRAPHY (CUSTOM PROCEDURE TRAY) ×5 IMPLANT
SET INTRO CAPELLA COAXIAL (SET/KITS/TRAYS/PACK) ×5 IMPLANT
SHEATH BRITE TIP 5FRX11 (SHEATH) ×5 IMPLANT
SHEATH BRITE TIP 6FRX11 (SHEATH) ×5 IMPLANT
SHEATH HIGHFLEX ANSEL 6FRX55 (SHEATH) ×5 IMPLANT
SYR MEDRAD MARK V 150ML (SYRINGE) ×5 IMPLANT
TUBING CONTRAST HIGH PRESS 72 (TUBING) ×5 IMPLANT
WIRE J 3MM .035X145CM (WIRE) ×5 IMPLANT

## 2015-06-09 NOTE — H&P (Signed)
 [facility] History & Physical Update  The patient was interviewed and re-examined.  The patient's previous History and Physical has been reviewed and is unchanged.  There is no change in the plan of care. We plan to proceed with the scheduled procedure.  Schnier, Dolores Lory, MD  06/09/2015, 10:48 AM

## 2015-06-11 ENCOUNTER — Encounter: Payer: No Typology Code available for payment source | Admitting: Surgery

## 2015-06-11 DIAGNOSIS — E11622 Type 2 diabetes mellitus with other skin ulcer: Secondary | ICD-10-CM | POA: Diagnosis not present

## 2015-06-12 NOTE — Progress Notes (Signed)
[name], ORENDER ([id number]) Visit Report for 06/11/2015 Arrival Information Details Patient Name: [name], [name]. Date of Service: 06/11/2015 3:15 PM Medical Record Number: [id number] Patient Account Number: [id number] Date of Birth/Sex: April 01, 1961 (54 y.o. Male) Treating RN: Baruch Gouty, RN, BSN, Velva Harman Primary Care Physician: Rutherford Nail Other Clinician: Referring Physician: Rutherford Nail Treating Physician/Extender: Frann Rider in Treatment: 4 Visit Information History Since Last Visit Added or deleted any medications: No Patient Arrived: Ambulatory Any new allergies or adverse reactions: No Arrival Time: 15:33 Had a fall or experienced change in No Accompanied By: self activities of daily living that may affect Transfer Assistance: None risk of falls: Patient Identification Verified: Yes Signs or symptoms of abuse/neglect since last No Secondary Verification Process Yes visito Completed: Hospitalized since last visit: No Patient Requires Transmission- No Has Dressing in Place as Prescribed: Yes Based Precautions: Pain Present Now: No Patient Has Alerts: Yes Patient Alerts: Patient on Blood Thinner Type II Diabetic Electronic Signature(s) Signed: 06/11/2015 3:34:12 PM By: Regan Lemming BSN, RN Entered By: Regan Lemming on 06/11/2015 15:34:12 Mandarino, Orson Ape ([id number]) -------------------------------------------------------------------------------- Encounter Discharge Information Details Patient Name: [name]. Date of Service: 06/11/2015 3:15 PM Medical Record Number: [id number] Patient Account Number: [id number] Date of Birth/Sex: Sep 18, 1960 (53 y.o. Male) Treating RN: Baruch Gouty, RN, BSN, Velva Harman Primary Care Physician: Rutherford Nail Other Clinician: Referring Physician: Rutherford Nail Treating Physician/Extender: Frann Rider in Treatment: 4 Encounter Discharge Information Items Discharge Pain Level: 0 Discharge Condition:  Stable Ambulatory Status: Ambulatory Discharge Destination: Home Transportation: Private Auto Accompanied By: self Schedule Follow-up Appointment: No Medication Reconciliation completed and provided to Patient/Care No Azaliah Carrero: Provided on Clinical Summary of Care: 06/11/2015 Form Type Recipient Paper Patient [employer] Electronic Signature(s) Signed: 06/11/2015 4:08:56 PM By: Regan Lemming BSN, RN Previous Signature: 06/11/2015 4:00:24 PM Version By: Ruthine Dose Entered By: Regan Lemming on 06/11/2015 16:08:56 Kloster, Orson Ape ([id number]) -------------------------------------------------------------------------------- Lower Extremity Assessment Details Patient Name: [name]. Date of Service: 06/11/2015 3:15 PM Medical Record Number: [id number] Patient Account Number: [id number] Date of Birth/Sex: [birth year] (54 y.o. Male) Treating RN: Baruch Gouty, RN, BSN, Velva Harman Primary Care Physician: Rutherford Nail Other Clinician: Referring Physician: Rutherford Nail Treating Physician/Extender: Frann Rider in Treatment: 4 Vascular Assessment Pulses: Posterior Tibial Dorsalis Pedis Palpable: [Right:Yes] Extremity colors, hair growth, and conditions: Extremity Color: [Right:Normal] Hair Growth on Extremity: [Right:Yes] Temperature of Extremity: [Right:Warm] Capillary Refill: [Right:< 3 seconds] Toe Nail Assessment Left: Right: Thick: No Discolored: No Deformed: No Improper Length and Hygiene: No Electronic Signature(s) Signed: 06/11/2015 3:40:52 PM By: Regan Lemming BSN, RN Entered By: Regan Lemming on 06/11/2015 15:40:51 Adamczak, Orson Ape ([id number]) -------------------------------------------------------------------------------- Multi Wound Chart Details Patient Name: [name]. Date of Service: 06/11/2015 3:15 PM Medical Record Number: [id number] Patient Account Number: [id number] Date of Birth/Sex: [birth year] (54 y.o. Male) Treating RN: Baruch Gouty, RN, BSN, Velva Harman Primary  Care Physician: Rutherford Nail Other Clinician: Referring Physician: Rutherford Nail Treating Physician/Extender: Frann Rider in Treatment: 4 Vital Signs Height(in): 67 Pulse(bpm): 89 Weight(lbs): 248 Blood Pressure 146/77 (mmHg): Body Mass Index(BMI): 39 Temperature(F): 97.9 Respiratory Rate 18 (breaths/min): Photos: [1:No Photos] [N/A:N/A] Wound Location: [1:Right Lower Leg - Medial] [N/A:N/A] Wounding Event: [1:Trauma] [N/A:N/A] Primary Etiology: [1:Diabetic Wound/Ulcer of the Lower Extremity] [N/A:N/A] Comorbid History: [1:Chronic Obstructive Pulmonary Disease (COPD), Congestive Heart Failure, Coronary Artery Disease, Myocardial Infarction, Peripheral Arterial Disease, Peripheral Venous Disease, Type II Diabetes, Neuropathy] [N/A:N/A] Date Acquired: [1:03/30/2015] [N/A:N/A] Weeks of Treatment: [1:4] [N/A:N/A] Wound Status: [1:Open] [N/A:N/A] Measurements L x W x D 3x0.9x0.2 [N/A:N/A] (  cm) Area (cm) : [1:2.121] [N/A:N/A] Volume (cm) : [1:0.424] [N/A:N/A] % Reduction in Area: [1:71.90%] [N/A:N/A] % Reduction in Volume: 85.90% [N/A:N/A] Classification: [1:Grade 2] [N/A:N/A] Exudate Amount: [1:Medium] [N/A:N/A] Exudate Type: [1:Serous] [N/A:N/A] Exudate Color: [1:amber] [N/A:N/A] Wound Margin: [1:Flat and Intact] [N/A:N/A] Granulation Amount: [1:Small (1-33%)] [N/A:N/A] Granulation Quality: Pink N/A N/A Necrotic Amount: Large (67-100%) N/A N/A Exposed Structures: Fascia: No N/A N/A Fat: No Tendon: No Muscle: No Joint: No Bone: No Limited to Skin Breakdown Epithelialization: Small (1-33%) N/A N/A Periwound Skin Texture: Edema: Yes N/A N/A Scarring: Yes Excoriation: No Induration: No Callus: No Crepitus: No Fluctuance: No Friable: No Rash: No Periwound Skin Moist: Yes N/A N/A Moisture: Maceration: No Dry/Scaly: No Periwound Skin Color: Atrophie Blanche: No N/A N/A Cyanosis: No Ecchymosis: No Erythema: No Hemosiderin Staining: No Mottled:  No Pallor: No Rubor: No Temperature: No Abnormality N/A N/A Tenderness on Yes N/A N/A Palpation: Wound Preparation: Ulcer Cleansing: N/A N/A Rinsed/Irrigated with Saline Topical Anesthetic Applied: Other: lidocaine 4% Treatment Notes Electronic Signature(s) Signed: 06/11/2015 3:43:27 PM By: Regan Lemming BSN, RN Entered By: Regan Lemming on 06/11/2015 15:43:26 Girouard, Orson Ape ([id number]) -------------------------------------------------------------------------------- [place] Details Patient Name: [name]. Date of Service: 06/11/2015 3:15 PM Medical Record Number: [id number] Patient Account Number: [id number] Date of Birth/Sex: August 11, 1960 (54 y.o. Male) Treating RN: Baruch Gouty, RN, BSN, Velva Harman Primary Care Physician: Rutherford Nail Other Clinician: Referring Physician: Rutherford Nail Treating Physician/Extender: Frann Rider in Treatment: 4 Active Inactive Abuse / Safety / Falls / Self Care Management Nursing Diagnoses: Potential for falls Self care deficit: actual or potential Goals: Patient will remain injury free Date Initiated: 05/12/2015 Goal Status: Active Interventions: Assess fall risk on admission and as needed Notes: Orientation to the Wound Care Program Nursing Diagnoses: Knowledge deficit related to the wound healing center program Goals: Patient/caregiver will verbalize understanding of the [place] Program Date Initiated: 05/12/2015 Goal Status: Active Interventions: Provide education on orientation to the wound center Notes: Soft Tissue Infection Nursing Diagnoses: Potential for infection: soft tissue Goals: Patient will remain free of wound infection BRAXTEN, FORBUSH ([id number]) Date Initiated: 05/12/2015 Goal Status: Active Signs and symptoms of infection will be recognized early to allow for prompt treatment Date Initiated: 05/12/2015 Goal Status: Active Interventions: Assess signs and symptoms  of infection every visit Treatment Activities: Systemic antibiotics : 06/11/2015 Notes: Wound/Skin Impairment Nursing Diagnoses: Impaired tissue integrity Goals: Ulcer/skin breakdown will heal within 14 weeks Date Initiated: 05/12/2015 Goal Status: Active Interventions: Assess ulceration(s) every visit Treatment Activities: Topical wound management initiated : 06/11/2015 Notes: Electronic Signature(s) Signed: 06/11/2015 3:41:48 PM By: Regan Lemming BSN, RN Entered By: Regan Lemming on 06/11/2015 15:41:48 Dugal, Orson Ape ([id number]) -------------------------------------------------------------------------------- Pain Assessment Details Patient Name: [name]. Date of Service: 06/11/2015 3:15 PM Medical Record Number: [id number] Patient Account Number: [id number] Date of Birth/Sex: Nov 14, 1960 (54 y.o. Male) Treating RN: Baruch Gouty, RN, BSN, Velva Harman Primary Care Physician: Rutherford Nail Other Clinician: Referring Physician: Rutherford Nail Treating Physician/Extender: Frann Rider in Treatment: 4 Active Problems Location of Pain Severity and Description of Pain Patient Has Paino No Site Locations Pain Management and Medication Current Pain Management: Electronic Signature(s) Signed: 06/11/2015 3:34:19 PM By: Regan Lemming BSN, RN Entered By: Regan Lemming on 06/11/2015 15:34:19 Righter, Orson Ape ([id number]) -------------------------------------------------------------------------------- Patient/Caregiver Education Details Patient Name: [name] Date of Service: 06/11/2015 3:15 PM Medical Record Number: [id number] Patient Account Number: [id number] Date of Birth/Gender: 1960-08-09 (54 y.o. Male) Treating RN: Baruch Gouty, RN, BSN, Velva Harman Primary Care Physician: Jessie Foot,  VICKIE Other Clinician: Referring Physician: Rutherford Nail Treating Physician/Extender: Frann Rider in Treatment: 4 Education Assessment Education Provided To: Patient Education Topics  Provided Welcome To [facility]: Methods: Explain/Verbal Responses: State content correctly Electronic Signature(s) Signed: 06/11/2015 4:09:06 PM By: Regan Lemming BSN, RN Entered By: Regan Lemming on 06/11/2015 16:09:06 Mckendree, Orson Ape ([id number]) -------------------------------------------------------------------------------- Wound Assessment Details Patient Name: [name]. Date of Service: 06/11/2015 3:15 PM Medical Record Number: [id number] Patient Account Number: [id number] Date of Birth/Sex: September 24, 1960 (54 y.o. Male) Treating RN: Baruch Gouty, RN, BSN, [place] Primary Care Physician: Rutherford Nail Other Clinician: Referring Physician: Rutherford Nail Treating Physician/Extender: Frann Rider in Treatment: 4 Wound Status Wound Number: 1 Primary Diabetic Wound/Ulcer of the Lower Etiology: Extremity Wound Location: Right Lower Leg - Medial Wound Open Wounding Event: Trauma Status: Date Acquired: 03/30/2015 Comorbid Chronic Obstructive Pulmonary Disease Weeks Of Treatment: 4 History: (COPD), Congestive Heart Failure, Clustered Wound: No Coronary Artery Disease, Myocardial Infarction, Peripheral Arterial Disease, Peripheral Venous Disease, Type II Diabetes, Neuropathy Photos Photo Uploaded By: Regan Lemming on 06/11/2015 16:55:13 Wound Measurements Length: (cm) 3 Width: (cm) 0.9 Depth: (cm) 0.2 Area: (cm) 2.121 Volume: (cm) 0.424 % Reduction in Area: 71.9% % Reduction in Volume: 85.9% Epithelialization: Small (1-33%) Tunneling: No Undermining: No Wound Description Classification: Grade 2 Foul Odor Aft Wound Margin: Flat and Intact Exudate Amount: Medium Exudate Type: Serous Exudate Color: amber er Cleansing: No Wound Bed Granulation Amount: Small (1-33%) Exposed Structure Momon, Orson Ape ([id number]) Granulation Quality: Pink Fascia Exposed: No Necrotic Amount: Large (67-100%) Fat Layer Exposed: No Necrotic Quality: Adherent  Slough Tendon Exposed: No Muscle Exposed: No Joint Exposed: No Bone Exposed: No Limited to Skin Breakdown Periwound Skin Texture Texture Color No Abnormalities Noted: No No Abnormalities Noted: No Callus: No Atrophie Blanche: No Crepitus: No Cyanosis: No Excoriation: No Ecchymosis: No Fluctuance: No Erythema: No Friable: No Hemosiderin Staining: No Induration: No Mottled: No Localized Edema: Yes Pallor: No Rash: No Rubor: No Scarring: Yes Temperature / Pain Moisture Temperature: No Abnormality No Abnormalities Noted: No Tenderness on Palpation: Yes Dry / Scaly: No Maceration: No Moist: Yes Wound Preparation Ulcer Cleansing: Rinsed/Irrigated with Saline Topical Anesthetic Applied: Other: lidocaine 4%, Treatment Notes Wound #1 (Right, Medial Lower Leg) 1. Cleansed with: Clean wound with Normal Saline 3. Peri-wound Care: Skin Prep 4. Dressing Applied: Aquacel Ag 5. Secondary Dressing Applied Bordered Foam Dressing Electronic Signature(s) Signed: 06/11/2015 3:41:39 PM By: Regan Lemming BSN, RN Entered By: Regan Lemming on 06/11/2015 15:41:39 Bendall, Orson Ape ([id number]) -------------------------------------------------------------------------------- [place] Details Patient Name: [name] Date of Service: 06/11/2015 3:15 PM Medical Record Number: [id number] Patient Account Number: [id number] Date of Birth/Sex: Nov 07, 1960 (54 y.o. Male) Treating RN: Afful, RN, BSN, [place] Primary Care Physician: Rutherford Nail Other Clinician: Referring Physician: Rutherford Nail Treating Physician/Extender: Frann Rider in Treatment: 4 Vital Signs Time Taken: 15:34 Temperature (F): 97.9 Height (in): 67 Pulse (bpm): 89 Weight (lbs): 248 Respiratory Rate (breaths/min): 18 Body Mass Index (BMI): 38.8 Blood Pressure (mmHg): 146/77 Reference Range: 80 - 120 mg / dl Electronic Signature(s) Signed: 06/11/2015 3:40:08 PM By: Regan Lemming BSN, RN Entered By:  Regan Lemming on 06/11/2015 15:40:07

## 2015-06-13 NOTE — Progress Notes (Signed)
DELFINO, SEMO ([id number]) Visit Report for 06/11/2015 Chief Complaint Document Details Patient Name: [name], [name]. Date of Service: 06/11/2015 3:15 PM Medical Record Number: [id number] Patient Account Number: [id number] Date of Birth/Sex: [birth year] (54 y.o. Male) Treating RN: Cornell Barman Primary Care Physician: Rutherford Nail Other Clinician: Referring Physician: Rutherford Nail Treating Physician/Extender: Frann Rider in Treatment: 4 Information Obtained from: Patient Chief Complaint Patient presents to the wound care center for a consult due non healing wound he had a lacerated wound to his right lateral calf on September 5 of this year. Electronic Signature(s) Signed: 06/11/2015 3:55:32 PM By: Christin Fudge MD, FACS Entered By: Christin Fudge on 06/11/2015 15:55:32 [name], [name] ([id number]) -------------------------------------------------------------------------------- Debridement Details Patient Name: [name]. Date of Service: 06/11/2015 3:15 PM Medical Record Number: [id number] Patient Account Number: [id number] Date of Birth/Sex: Aug 24, 1960 (54 y.o. Male) Treating RN: Cornell Barman Primary Care Physician: Rutherford Nail Other Clinician: Referring Physician: Rutherford Nail Treating Physician/Extender: Frann Rider in Treatment: 4 Debridement Performed for Wound #1 Right,Medial Lower Leg Assessment: Performed By: Physician Christin Fudge, MD Debridement: Debridement Pre-procedure Yes Verification/Time Out Taken: Start Time: 15:50 Pain Control: Lidocaine 4% Topical Solution Level: Skin/Subcutaneous Tissue Total Area Debrided (L x 3 (cm) x 0.9 (cm) = 2.7 (cm) W): Tissue and other Non-Viable, Exudate, Fibrin/Slough, Subcutaneous material debrided: Instrument: Curette Bleeding: Minimum Hemostasis Achieved: Pressure End Time: 15:50 Procedural Pain: 0 Post Procedural Pain: 0 Response to Treatment: Procedure was tolerated  well Post Debridement Measurements of Total Wound Length: (cm) 3 Width: (cm) 0.9 Depth: (cm) 0.3 Volume: (cm) 0.636 Post Procedure Diagnosis Same as Pre-procedure Electronic Signature(s) Signed: 06/11/2015 3:55:26 PM By: Christin Fudge MD, FACS Signed: 06/11/2015 5:49:44 PM By: Gretta Cool RN, BSN, Kim RN, BSN Previous Signature: 06/11/2015 3:50:54 PM Version By: Regan Lemming BSN, RN Entered By: Christin Fudge on 06/11/2015 15:55:26 [name], [name] ([id number]) -------------------------------------------------------------------------------- HPI Details Patient Name: [name]. Date of Service: 06/11/2015 3:15 PM Medical Record Number: [id number] Patient Account Number: [id number] Date of Birth/Sex: 1961/06/26 (54 y.o. Male) Treating RN: Cornell Barman Primary Care Physician: Rutherford Nail Other Clinician: Referring Physician: Rutherford Nail Treating Physician/Extender: Frann Rider in Treatment: 4 History of Present Illness Location: injury to the right lateral calf 03/30/2015 Quality: Patient reports experiencing a sharp pain to affected area(s). Severity: Patient states wound (s) are getting better. Duration: Patient has had the wound for < 5 weeks prior to presenting for treatment Timing: Pain in wound is constant (hurts all the time) Context: The wound occurred when the patient injured himself with a sharp object and he had a lacerated wound Modifying Factors: Consults to this date include: 2 rounds of antibiotics which include doxycycline and ciprofloxacin. Associated Signs and Symptoms: Patient reports having difficulty standing for long periods. HPI Description: 54 year old gentleman who is known to have diabetes mellitus, hyperlipidemia, chronic pain syndrome, long-term use of opiate , peripheral artery disease and has had stents placed in lower extremities by Dr. Nicoletta Dress in 2014, carotid stenosis, coronary artery disease, status post CABG, CHF, COPD, atrial  fibrillation, obesity, and cardiomyopathy. He reports that he had a wound on his right leg for which he was put on Cipro and doxycycline. He quit smoking about 18 months ago and prior to that smoked about a pack of cigarettes a day. x-[name] of his leg done on 03/30/2015 showed no acute bony abnormality but he did have soft tissue calcification. Review of his arterial procedure notes revealed that on 05/14/2013 he had an abdominal aortogram  with a right lower extremity runoff, angioplasty of the right common femoral artery, angioplasty of the right common iliac artery and angioplasty of the left external iliac artery by Dr. Delana Meyer. 05/19/2015 -- was seen by Dr. Hortencia Pilar on 04/25/2015 and his advice was due to the fact that the patient has increasing symptoms and now having claudication and rest pain he has recommended angiography and intervention. the patient however has not setup his appointment for the stent and I have urged him to do this as soon as possible. 06/04/2015 -- he went back to see Dr. Delana Meyer and after review he has been scheduled for a angiography and intervention this coming Tuesday. 06/11/2015 -- he had an operative procedure done by Dr. Delana Meyer on 06/09/2015 and I have reviewed the notes in Epic. He had a angiography and angioplasty of the right femoral artery and has to see him back in a month's time. Electronic Signature(s) Signed: 06/11/2015 3:56:40 PM By: Christin Fudge MD, FACS Entered By: Christin Fudge on 06/11/2015 15:56:39 [name], [name] ([id number]) -------------------------------------------------------------------------------- Physical Exam Details Patient Name: [name] Date of Service: 06/11/2015 3:15 PM Medical Record Number: [id number] Patient Account Number: [id number] Date of Birth/Sex: 1960-08-06 (54 y.o. Male) Treating RN: Cornell Barman Primary Care Physician: Rutherford Nail Other Clinician: Referring Physician: Rutherford Nail Treating Physician/Extender: Frann Rider in Treatment: 4 Constitutional . Pulse regular. Respirations normal and unlabored. Afebrile. . Eyes Nonicteric. Reactive to light. Ears, Nose, Mouth, and Throat Lips, teeth, and gums WNL.Marland Kitchen Moist mucosa without lesions . Neck supple and nontender. No palpable supraclavicular or cervical adenopathy. Normal sized without goiter. Respiratory WNL. No retractions.. Cardiovascular Pedal Pulses WNL. No clubbing, cyanosis or edema. Lymphatic No adneopathy. No adenopathy. No adenopathy. Musculoskeletal Adexa without tenderness or enlargement.. Digits and nails w/o clubbing, cyanosis, infection, petechiae, ischemia, or inflammatory conditions.. Integumentary (Hair, Skin) No suspicious lesions. No crepitus or fluctuance. No peri-wound warmth or erythema. No masses.Marland Kitchen Psychiatric Judgement and insight Intact.. No evidence of depression, anxiety, or agitation.. Notes the slough is no slightly loose and the subcutaneous tissue and will need sharp debridement with a curette. Electronic Signature(s) Signed: 06/11/2015 3:57:14 PM By: Christin Fudge MD, FACS Entered By: Christin Fudge on 06/11/2015 15:57:13 [name], [name] ([id number]) -------------------------------------------------------------------------------- Physician Orders Details Patient Name: [name]. Date of Service: 06/11/2015 3:15 PM Medical Record Number: [id number] Patient Account Number: [id number] Date of Birth/Sex: [birth year] (54 y.o. Male) Treating RN: Baruch Gouty, RN, BSN, Velva Harman Primary Care Physician: Rutherford Nail Other Clinician: Referring Physician: Rutherford Nail Treating Physician/Extender: Frann Rider in Treatment: 4 Verbal / Phone Orders: Yes Clinician: Afful, RN, BSN, Rita Read Back and Verified: Yes Diagnosis Coding Wound Cleansing Wound #1 Right,Medial Lower Leg o Cleanse wound with mild soap and water o May Shower, gently pat wound  dry prior to applying new dressing. o May shower with protection. Anesthetic Wound #1 Right,Medial Lower Leg o Topical Lidocaine 4% cream applied to wound bed prior to debridement Skin Barriers/Peri-Wound Care Wound #1 Right,Medial Lower Leg o Skin Prep Primary Wound Dressing Wound #1 Right,Medial Lower Leg o Prisma Ag Secondary Dressing Wound #1 Right,Medial Lower Leg o Boardered Foam Dressing Dressing Change Frequency Wound #1 Right,Medial Lower Leg o Change dressing every other day. Follow-up Appointments Wound #1 Right,Medial Lower Leg o Return Appointment in 2 weeks. Electronic Signature(s) Signed: 06/11/2015 3:53:50 PM By: Regan Lemming BSN, RN Signed: 06/11/2015 4:05:55 PM By: Christin Fudge MD, FACS [name] ([id number]) Entered By: Regan Lemming on 06/11/2015 15:53:50 [name],  [name] ([id number]) -------------------------------------------------------------------------------- Problem List Details Patient Name: [name], [name]. Date of Service: 06/11/2015 3:15 PM Medical Record Number: [id number] Patient Account Number: [id number] Date of Birth/Sex: 07-20-1961 (54 y.o. 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 Date of Service: 06/11/2015 3:15 PM Medical Record Number: [id number] Patient Account Number: [id number] Date of Birth/Sex: [birth year] (54 y.o. Male) Treating RN: Cornell Barman Primary Care Physician: Rutherford Nail Other Clinician: Referring Physician: Rutherford Nail Treating Physician/Extender: Frann Rider in Treatment: 4 Subjective Chief Complaint Information obtained from Patient Patient presents to the wound care center for a consult due non healing wound he had a lacerated wound to his right lateral calf on September 5 of this year. History of Present Illness (HPI) The following HPI elements were documented for the patient's wound: Location: injury to the right lateral calf 03/30/2015 Quality: Patient reports experiencing a sharp pain to affected area(s). Severity: Patient states wound (s) are getting better. Duration: Patient has had the wound for < 5 weeks prior to presenting for treatment Timing: Pain in wound is constant (hurts all the time) Context: The wound occurred when the patient injured himself with a sharp object and he had a lacerated wound Modifying Factors: Consults to this date include: 2 rounds of antibiotics which include doxycycline and ciprofloxacin. Associated Signs and Symptoms: Patient reports having difficulty standing for long periods. 54 year old gentleman who is known to have diabetes mellitus, hyperlipidemia, chronic pain syndrome, long-term use of opiate , peripheral artery disease and has had stents placed in lower extremities by Dr. Nicoletta Dress in 2014, carotid stenosis, coronary artery disease, status post CABG, CHF, COPD, atrial fibrillation, obesity, and cardiomyopathy. He reports that he had a wound on his right leg for which he was put on Cipro and doxycycline. He quit smoking about 18 months  ago and prior to that smoked about a pack of cigarettes a day. x-[name] of his leg done on 03/30/2015 showed no acute bony abnormality but he did have soft tissue calcification. Review of his arterial procedure notes revealed that on 05/14/2013 he had an abdominal aortogram with a right lower extremity runoff, angioplasty of the right common femoral artery, angioplasty of the right common iliac artery and angioplasty of the left external iliac artery by Dr. Delana Meyer. 05/19/2015 -- was seen by Dr. Hortencia Pilar on 04/25/2015 and his advice was due to the fact that the patient has increasing symptoms and now having claudication and rest pain he has recommended angiography and intervention. the patient however has not setup his appointment for the stent and I have urged him to do this as soon as possible. 06/04/2015 -- he went back to see Dr. Delana Meyer and after review he has been scheduled for a angiography and intervention this coming Tuesday. 06/11/2015 -- he had an operative procedure done by Dr. Delana Meyer on 06/09/2015 and I have reviewed the JABRAYLON, [name] ([id number]) notes in [place]. He  had a angiography and angioplasty of the right femoral artery and has to see him back in a month's time. Objective Constitutional Pulse regular. Respirations normal and unlabored. Afebrile. Vitals Time Taken: 3:34 PM, Height: 67 in, Weight: 248 lbs, BMI: 38.8, Temperature: 97.9 F, Pulse: 89 bpm, Respiratory Rate: 18 breaths/min, Blood Pressure: 146/77 mmHg. Eyes Nonicteric. Reactive to light. Ears, Nose, Mouth, and Throat Lips, teeth, and gums WNL.Marland Kitchen Moist mucosa without lesions . Neck supple and nontender. No palpable supraclavicular or cervical adenopathy. Normal sized without goiter. Respiratory WNL. No retractions.. Cardiovascular Pedal Pulses WNL. No clubbing, cyanosis or edema. Lymphatic No adneopathy. No adenopathy. No adenopathy. Musculoskeletal Adexa without tenderness or enlargement..  Digits and nails w/o clubbing, cyanosis, infection, petechiae, ischemia, or inflammatory conditions.Marland Kitchen Psychiatric Judgement and insight Intact.. No evidence of depression, anxiety, or agitation.. General Notes: the slough is no slightly loose and the subcutaneous tissue and will need sharp debridement with a curette. Integumentary (Hair, Skin) No suspicious lesions. No crepitus or fluctuance. No peri-wound warmth or erythema. No masses.. Wound #1 status is Open. Original cause of wound was Trauma. The wound is located on the [place], [name] ([id number]) Lower Leg. The wound measures 3cm length x 0.9cm width x 0.2cm depth; 2.121cm^2 area and 0.424cm^3 volume. The wound is limited to skin breakdown. There is no tunneling or undermining noted. There is a medium amount of serous drainage noted. The wound margin is flat and intact. There is small (1-33%) pink granulation within the wound bed. There is a large (67-100%) amount of necrotic tissue within the wound bed including Adherent Slough. The periwound skin appearance exhibited: Localized Edema, Scarring, Moist. The periwound skin appearance did not exhibit: Callus, Crepitus, Excoriation, Fluctuance, Friable, Induration, Rash, Dry/Scaly, Maceration, Atrophie Blanche, Cyanosis, Ecchymosis, Hemosiderin Staining, Mottled, Pallor, Rubor, Erythema. Periwound temperature was noted as No Abnormality. The periwound has tenderness on palpation. 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 We will continue to see him until we have good resolution of his  issues and because of the holiday we'll see him on Thursday next week. Procedures Wound #1 Wound #1 is a Diabetic Wound/Ulcer of the Lower Extremity located on the Right,Medial Lower Leg . There was a Skin/Subcutaneous Tissue Debridement [id number]) debridement with total area of 2.7 sq cm performed by Christin Fudge, MD. with the following instrument(s): Curette to remove Non-Viable tissue/material including Exudate, Fibrin/Slough, and Subcutaneous after achieving pain control using Lidocaine 4% Topical Solution. A time out was conducted prior to the start of the procedure. A Minimum amount of bleeding was controlled with Pressure. The procedure was tolerated well with a pain level of 0 throughout and a pain level of 0 following the procedure. Post Debridement Measurements: 3cm length x 0.9cm width x 0.3cm depth; 0.636cm^3 volume. Post procedure Diagnosis Wound #1: Same as Pre-Procedure [name], [name] ([id number]) Plan Wound Cleansing: Wound #1 Right,Medial Lower Leg: Cleanse wound with mild soap and water May Shower, gently pat wound dry prior to applying new dressing. May shower with protection. Anesthetic: Wound #1 Right,Medial Lower Leg: Topical Lidocaine 4% cream applied to wound bed prior to debridement Skin Barriers/Peri-Wound Care:  Wound #1 Right,Medial Lower Leg: Skin Prep Primary Wound Dressing: Wound #1 Right,Medial Lower Leg: Prisma Ag Secondary Dressing: Wound #1 Right,Medial Lower Leg: Boardered Foam Dressing Dressing Change Frequency: Wound #1 Right,Medial Lower Leg: Change dressing every other day. Follow-up Appointments: Wound #1 Right,Medial Lower Leg: Return Appointment in 2 weeks. Since he's had his angioplasty and I have sent his wound out the best we could I have recommended the use Prisma AG on alternate days with a bordered form. We will continue to see him until we have good resolution of his issues and because of the holiday we'll see him on  Thursday next week. Electronic Signature(s) Signed: 06/11/2015 3:58:30 PM By: Christin Fudge MD, FACS Entered By: Christin Fudge on 06/11/2015 15:58:29 [place], [name] ([id number]) -------------------------------------------------------------------------------- SuperBill Details Patient Name: [name] Date of Service: 06/11/2015 Medical Record Number: [id number] Patient Account Number: [id number] Date of Birth/Sex: [birth year] (54 y.o. Male) Treating RN: Cornell Barman Primary Care Physician: Rutherford Nail Other Clinician: Referring Physician: Rutherford Nail Treating Physician/Extender: Frann Rider in Treatment: 4 Diagnosis Coding ICD-10 Codes Code Description [phone] Type 2 diabetes mellitus with other skin ulcer S81.811A Laceration without foreign body, right lower leg, initial encounter Atherosclerosis of nonautologous biological bypass graft(s) of the right leg with ulceration I70.532 of calf Facility Procedures CPT4: Description Modifier Quantity Code [id number] - DEB SUBQ TISSUE 20 SQ CM/< 1 ICD-10 Description Diagnosis E11.622 Type 2 diabetes mellitus with other skin ulcer S81.811A Laceration without foreign body, right lower leg, initial encounter  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 Physician Procedures CPT4: Description Modifier Quantity Code [id number] - WC PHYS LEVEL 3 - EST PT 25 1 ICD-10 Description Diagnosis E11.622 Type 2 diabetes mellitus with other skin ulcer S81.811A Laceration without foreign body, right lower leg, initial encounter  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

## 2015-06-17 ENCOUNTER — Encounter: Payer: Self-pay | Admitting: Vascular Surgery

## 2015-06-17 NOTE — Op Note (Signed)
[facility] Percutaneous Study/Intervention Procedural Note   Date of Surgery: 06/17/2015  Surgeon:  Katha Cabal, MD.  Pre-operative Diagnosis: Atherosclerotic occlusive disease bilateral lower extremities with rest pain of the right lower extremity  Post-operative diagnosis: Same  Procedure(s) Performed: 1. Introduction catheter into right lower extremity 3rd order catheter placement  2. Contrast injection right lower extremity for distal runoff   3. Percutaneous transluminal angioplasty to 7 mm right common femoral artery  4. Minx closure left common femoral arteriotomy  Anesthesia: Conscious sedation with IV Versed and fentanyl  Sheath: 6 French Ansell  Contrast: 90 cc  Fluoroscopy Time: 12 minutes  Indications: [name] presents with pain in his right lower extremity even while resting. Physical examination as well as noninvasive studies demonstrate significant atherosclerotic occlusive disease. The option for angiography and intervention was reviewed with the patient. The risks and benefits are reviewed all questions answered patient agrees to proceed.  Procedure: MURIL ANDRZEJEWSKI is a 54 y.o. y.o. male who was identified and appropriate procedural time out was performed. The patient was then placed supine on the table and prepped and draped in the usual sterile fashion.   Ultrasound was placed in the sterile sleeve and the left groin was evaluated the left common femoral artery was echolucent and pulsatile indicating patency.  Image was recorded for the permanent record and under real-time visualization a microneedle was inserted into the common femoral artery microwire followed by a micro-sheath.  A J-wire was then advanced through the micro-sheath and a  5 [place] sheath was then inserted over a J-wire. J-wire was then advanced and a 5 French pigtail catheter was positioned at the  level of T12. AP projection of the aorta was then obtained. Pigtail catheter was repositioned to above the bifurcation and a LAO view of the pelvis was obtained.  Subsequently a pigtail catheter with the stiff angle Glidewire was used to cross the aortic bifurcation the catheter wire were advanced down into the right distal external iliac artery. Oblique view of the femoral bifurcation was then obtained and subsequently the wire was reintroduced and the pigtail catheter negotiated into the SFA representing third order catheter placement. Distal runoff was then performed.  5000 units of heparin was then given and allowed to circulate and a Ansell sheath was advanced up and over the bifurcation and positioned in the femoral artery  KMP catheter as well as a straight catheter and a stiff angle Glidewire were then negotiated down into the mid SFA.  However given the pattern of collaterals the wire and catheter continued to course into these rather than through the occlusion. Therefore, no attempts at recanalization of the SFA and popliteal were made.   The wire was then reintroduced and a 6 mm Lutonix balloon and then a 7 mm balloon was used to angioplasty the common femoral artery. Inflations were to 14 atmospheres for 2 minutes. Follow-up imaging demonstrated patency with adequate luminal gain. Distal runoff was then reassessed.  After review of these images the sheath is pulled into the left external iliac oblique of the common femoral is obtained and a minx device deployed. There no immediate Complications.  Findings: There is diffuse atherosclerotic changes noted within the aorta as well as the bilateral common and external iliac arteries. However a focal hemodynamically significant stenosis is not identified.  The right common femoral demonstrates an 80-90% stenosis in its proximal half. The profunda femoris is otherwise patent. The superficial femoral artery is severely diseased  at its origin  extending down 10-15 cm at which point it occludes. At the point of occlusion there are 3 fairly large collaterals which extend down below the knee. There is reconstitution of the above-knee popliteal and three-vessel runoff to the foot.  Following attempts at crossing the occluded SFA these were unsuccessful and the critical lesion in the common femoral was addressed. Given the location of this lesion intervention may be a fairly temporizing measure however should relieve his pain and consideration for a common femoral endarterectomy with a femoral to above-knee popliteal bypass will be given. This surgery will be discussed with the patient and we'll move forward depending on the patient's level of debility.   Disposition: Patient was taken to the recovery room in stable condition having tolerated the procedure well.  Gesenia Bantz, Dolores Lory 04/28/2015,3:14 PM

## 2015-06-25 ENCOUNTER — Encounter: Payer: No Typology Code available for payment source | Attending: Surgery | Admitting: Surgery

## 2015-06-25 DIAGNOSIS — I70532 Atherosclerosis of nonautologous biological bypass graft(s) of the right leg with ulceration of calf: Secondary | ICD-10-CM | POA: Insufficient documentation

## 2015-06-25 DIAGNOSIS — G894 Chronic pain syndrome: Secondary | ICD-10-CM | POA: Diagnosis not present

## 2015-06-25 DIAGNOSIS — J449 Chronic obstructive pulmonary disease, unspecified: Secondary | ICD-10-CM | POA: Diagnosis not present

## 2015-06-25 DIAGNOSIS — Z87891 Personal history of nicotine dependence: Secondary | ICD-10-CM | POA: Insufficient documentation

## 2015-06-25 DIAGNOSIS — E11622 Type 2 diabetes mellitus with other skin ulcer: Secondary | ICD-10-CM | POA: Diagnosis present

## 2015-06-25 DIAGNOSIS — I509 Heart failure, unspecified: Secondary | ICD-10-CM | POA: Diagnosis not present

## 2015-06-25 DIAGNOSIS — I739 Peripheral vascular disease, unspecified: Secondary | ICD-10-CM | POA: Insufficient documentation

## 2015-06-25 DIAGNOSIS — L97811 Non-pressure chronic ulcer of other part of right lower leg limited to breakdown of skin: Secondary | ICD-10-CM | POA: Diagnosis not present

## 2015-06-25 DIAGNOSIS — Z79891 Long term (current) use of opiate analgesic: Secondary | ICD-10-CM | POA: Diagnosis not present

## 2015-06-25 DIAGNOSIS — E669 Obesity, unspecified: Secondary | ICD-10-CM | POA: Insufficient documentation

## 2015-06-25 DIAGNOSIS — I429 Cardiomyopathy, unspecified: Secondary | ICD-10-CM | POA: Diagnosis not present

## 2015-06-25 DIAGNOSIS — I2510 Atherosclerotic heart disease of native coronary artery without angina pectoris: Secondary | ICD-10-CM | POA: Insufficient documentation

## 2015-06-26 NOTE — Progress Notes (Signed)
[name], [name] ([id number]) Visit Report for 06/25/2015 Arrival Information Details Patient Name: [name], [name]. Date of Service: 06/25/2015 3:00 PM Medical Record Number: [id number] Patient Account Number: [id number] Date of Birth/Sex: Dec 20, 1960 (54 y.o. Male) Treating RN: John Gouty, RN, BSN, [name] Primary Care Physician: [name] Other Clinician: Referring Physician: Rutherford [name] Treating Physician/Extender: [name] in Treatment: 6 Visit Information History Since Last Visit Added or deleted any medications: No Patient Arrived: Ambulatory Any new allergies or adverse reactions: No Arrival Time: 15:25 Had a fall or experienced change in No Accompanied By: self activities of daily living that may affect Transfer Assistance: None risk of falls: Patient Requires Transmission- No Signs or symptoms of abuse/neglect since last No Based Precautions: visito Patient Has Alerts: Yes Has Dressing in Place as Prescribed: Yes Patient Alerts: Patient on Blood Pain Present Now: No Thinner Type II Diabetic Electronic Signature(s) Signed: 06/25/2015 3:26:15 PM By: [name] BSN, RN Entered By: [name] on 06/25/2015 15:26:15 [name], [name] ([id number]) -------------------------------------------------------------------------------- Encounter Discharge Information Details Patient Name: [name]. Date of Service: 06/25/2015 3:00 PM Medical Record Number: [id number] Patient Account Number: [id number] Date of Birth/Sex: 10-04-1960 (54 y.o. Male) Treating RN: John Gouty, RN, BSN, [name] Primary Care Physician: [name] Other Clinician: Referring Physician: Rutherford [name] Treating Physician/Extender: [name] in Treatment: 6 Encounter Discharge Information Items Schedule Follow-up Appointment: No Medication Reconciliation completed No and provided to Patient/Care Tyreesha Maharaj: Provided on Clinical Summary of Care: 06/25/2015 Form Type  Recipient Paper Patient [employer] Electronic Signature(s) Signed: 06/25/2015 3:46:03 PM By: [name] Entered By: [name] on 06/25/2015 15:46:03 [name], [name] ([id number]) -------------------------------------------------------------------------------- Lower Extremity Assessment Details Patient Name: [name]. Date of Service: 06/25/2015 3:00 PM Medical Record Number: [id number] Patient Account Number: [id number] Date of Birth/Sex: 10/09/1960 (54 y.o. Male) Treating RN: John Gouty, RN, BSN, [name] Primary Care Physician: [name] Other Clinician: Referring Physician: Rutherford [name] Treating Physician/Extender: [name] in Treatment: 6 Edema Assessment Assessed: [Left: No] [Right: No] Edema: [Left: N] [Right: o] Vascular Assessment Pulses: Posterior Tibial Dorsalis Pedis Palpable: [Right:Yes] Extremity colors, hair growth, and conditions: Extremity Color: [Right:Normal] Hair Growth on Extremity: [Right:Yes] Temperature of Extremity: [Right:Warm] Capillary Refill: [Right:< 3 seconds] Toe [name] Assessment Left: Right: Thick: No Discolored: No Deformed: No Improper Length and Hygiene: No Electronic Signature(s) Signed: 06/25/2015 3:27:12 PM By: [name] BSN, RN Entered By: [name] on 06/25/2015 15:27:12 [name], [name] ([id number]) -------------------------------------------------------------------------------- Multi Wound Chart Details Patient Name: [name]. Date of Service: 06/25/2015 3:00 PM Medical Record Number: [id number] Patient Account Number: [id number] Date of Birth/Sex: 1960-08-28 (54 y.o. Male) Treating RN: John Gouty, RN, BSN, [name] Primary Care Physician: [name] Other Clinician: Referring Physician: Rutherford [name] Treating Physician/Extender: [name] in Treatment: 6 Vital Signs Height(in): 67 Pulse(bpm): 87 Weight(lbs): 248 Blood Pressure 119/53 (mmHg): Body Mass Index(BMI):  39 Temperature(F): 98.1 Respiratory Rate 18 (breaths/min): Photos: [1:No Photos] [N/A:N/A] Wound Location: [1:Right Lower Leg - Medial] [N/A:N/A] Wounding Event: [1:Trauma] [N/A:N/A] Primary Etiology: [1:Diabetic Wound/Ulcer of the Lower Extremity] [N/A:N/A] Comorbid History: [1:Chronic Obstructive Pulmonary Disease (COPD), Congestive Heart Failure, Coronary Artery Disease, Myocardial Infarction, Peripheral Arterial Disease, Peripheral Venous Disease, Type II Diabetes, Neuropathy] [N/A:N/A] Date Acquired: [1:03/30/2015] [N/A:N/A] Weeks of Treatment: [1:6] [N/A:N/A] Wound Status: [1:Open] [N/A:N/A] Measurements L x W x D 2.5x0.7x0.2 [N/A:N/A] (cm) Area (cm) : [1:1.374] [N/A:N/A] Volume (cm) : [1:0.275] [N/A:N/A] % Reduction in Area: [1:81.80%] [N/A:N/A] % Reduction in Volume: 90.90% [N/A:N/A] Classification: [1:Grade 2] [N/A:N/A] Exudate Amount: [1:Medium] [N/A:N/A] Exudate  Type: [1:Serous] [N/A:N/A] Exudate Color: [1:amber] [N/A:N/A] Wound Margin: [1:Flat and Intact] [N/A:N/A] Granulation Amount: [1:Small (1-33%)] [N/A:N/A] Granulation Quality: Pink N/A N/A Necrotic Amount: Large (67-100%) N/A N/A Exposed Structures: Fascia: No N/A N/A Fat: No Tendon: No Muscle: No Joint: No Bone: No Limited to Skin Breakdown Epithelialization: Medium (34-66%) N/A N/A Periwound Skin Texture: Edema: Yes N/A N/A Scarring: Yes Excoriation: No Induration: No Callus: No Crepitus: No Fluctuance: No Friable: No Rash: No Periwound Skin Moist: Yes N/A N/A Moisture: Maceration: No Dry/Scaly: No Periwound Skin Color: Atrophie Blanche: No N/A N/A Cyanosis: No Ecchymosis: No Erythema: No Hemosiderin Staining: No Mottled: No Pallor: No Rubor: No Temperature: No Abnormality N/A N/A Tenderness on Yes N/A N/A Palpation: Wound Preparation: Ulcer Cleansing: N/A N/A Rinsed/Irrigated with Saline Topical Anesthetic Applied: Other: lidocaine 4% Treatment Notes Electronic  Signature(s) Signed: 06/25/2015 3:32:10 PM By: [name] BSN, RN Entered By: [name] on 06/25/2015 15:32:09 [name], [name] ([id number]) -------------------------------------------------------------------------------- [place] Details Patient Name: [name]. Date of Service: 06/25/2015 3:00 PM Medical Record Number: [id number] Patient Account Number: [id number] Date of Birth/Sex: Sep 13, 1960 (54 y.o. Male) Treating RN: John Gouty, RN, BSN, [name] Primary Care Physician: [name] Other Clinician: Referring Physician: Rutherford [name] Treating Physician/Extender: [name] in Treatment: 6 Active Inactive Abuse / Safety / Falls / [place] Management Nursing Diagnoses: Potential for falls Self care deficit: actual or potential Goals: Patient will remain injury free Date Initiated: 05/12/2015 Goal Status: Active Interventions: Assess fall risk on admission and as needed Notes: Orientation to the Wound Care Program Nursing Diagnoses: Knowledge deficit related to the wound healing center program Goals: Patient/caregiver will verbalize understanding of the [facility] Program Date Initiated: 05/12/2015 Goal Status: Active Interventions: Provide education on orientation to the wound center Notes: Soft Tissue Infection Nursing Diagnoses: Potential for infection: soft tissue Goals: Patient will remain free of wound infection [name], [name] ([id number]) Date Initiated: 05/12/2015 Goal Status: Active Signs and symptoms of infection will be recognized early to allow for prompt treatment Date Initiated: 05/12/2015 Goal Status: Active Interventions: Assess signs and symptoms of infection every visit Treatment Activities: Systemic antibiotics : 06/25/2015 Notes: Wound/Skin Impairment Nursing Diagnoses: Impaired tissue integrity Goals: Ulcer/skin breakdown will heal within 14 weeks Date Initiated: 05/12/2015 Goal Status:  Active Interventions: Assess ulceration(s) every visit Treatment Activities: Topical wound management initiated : 06/25/2015 Notes: Electronic Signature(s) Signed: 06/25/2015 3:32:03 PM By: [name] BSN, RN Entered By: [name] on 06/25/2015 15:32:02 [name] ([id number]) -------------------------------------------------------------------------------- Pain Assessment Details Patient Name: [name]. Date of Service: 06/25/2015 3:00 PM Medical Record Number: [id number] Patient Account Number: [id number] Date of Birth/Sex: May 05, 1961 (54 y.o. Male) Treating RN: John Gouty, RN, BSN, [name] Primary Care Physician: [name] Other Clinician: Referring Physician: Rutherford [name] Treating Physician/Extender: [name] in Treatment: 6 Active Problems Location of Pain Severity and Description of Pain Patient Has Paino No Site Locations Pain Management and Medication Current Pain Management: Electronic Signature(s) Signed: 06/25/2015 3:26:41 PM By: [name] BSN, RN Entered By: [name] on 06/25/2015 15:26:41 Wanner, [name] ([id number]) -------------------------------------------------------------------------------- Wound Assessment Details Patient Name: [name]. Date of Service: 06/25/2015 3:00 PM Medical Record Number: [id number] Patient Account Number: [id number] Date of Birth/Sex: [birth year] (54 y.o. Male) Treating RN: John Gouty, RN, BSN, [name] Primary Care Physician: [name] Other Clinician: Referring Physician: Rutherford [name] Treating Physician/Extender: [name] in Treatment: 6 Wound Status Wound Number: 1 Primary Diabetic Wound/Ulcer of the Lower Etiology: Extremity Wound Location: Right Lower  Leg - Medial Wound Open Wounding Event: Trauma Status: Date Acquired: 03/30/2015 Comorbid Chronic Obstructive Pulmonary Disease Weeks Of Treatment: 6 History: (COPD), Congestive Heart Failure, Clustered Wound:  No Coronary Artery Disease, Myocardial Infarction, Peripheral Arterial Disease, Peripheral Venous Disease, Type II Diabetes, Neuropathy Photos Photo Uploaded By: [name] on 06/25/2015 16:50:59 Wound Measurements Length: (cm) 2.5 Width: (cm) 0.7 Depth: (cm) 0.2 Area: (cm) 1.374 Volume: (cm) 0.275 % Reduction in Area: 81.8% % Reduction in Volume: 90.9% Epithelialization: Medium (34-66%) Tunneling: No Undermining: No Wound Description Classification: Grade 2 Foul Odor Aft Wound Margin: Flat and Intact Exudate Amount: Medium Exudate Type: Serous Exudate Color: amber er Cleansing: No Wound Bed Granulation Amount: Small (1-33%) Exposed Structure Lorson, [name] ([id number]) Granulation Quality: Pink Fascia Exposed: No Necrotic Amount: Large (67-100%) Fat Layer Exposed: No Necrotic Quality: Adherent Slough Tendon Exposed: No Muscle Exposed: No Joint Exposed: No Bone Exposed: No Limited to Skin Breakdown Periwound Skin Texture Texture Color No Abnormalities Noted: No No Abnormalities Noted: No Callus: No Atrophie Blanche: No Crepitus: No Cyanosis: No Excoriation: No Ecchymosis: No Fluctuance: No Erythema: No Friable: No Hemosiderin Staining: No Induration: No Mottled: No Localized Edema: Yes Pallor: No Rash: No Rubor: No Scarring: Yes Temperature / Pain Moisture Temperature: No Abnormality No Abnormalities Noted: No Tenderness on Palpation: Yes Dry / Scaly: No Maceration: No Moist: Yes Wound Preparation Ulcer Cleansing: Rinsed/Irrigated with Saline Topical Anesthetic Applied: Other: lidocaine 4%, Electronic Signature(s) Signed: 06/25/2015 3:30:21 PM By: [name] BSN, RN Entered By: [name] on 06/25/2015 15:30:21 Brosh, [name] ([id number]) -------------------------------------------------------------------------------- Vitals Details Patient Name: [name] Date of Service: 06/25/2015 3:00 PM Medical Record Number:  [id number] Patient Account Number: [id number] Date of Birth/Sex: 08-03-1960 (54 y.o. Male) Treating RN: John Gouty, RN, BSN, [place] Primary Care Physician: [name] Other Clinician: Referring Physician: Rutherford [name] Treating Physician/Extender: [name] in Treatment: 6 Vital Signs Time Taken: 15:30 Temperature (F): 98.1 Height (in): 67 Pulse (bpm): 87 Weight (lbs): 248 Respiratory Rate (breaths/min): 18 Body Mass Index (BMI): 38.8 Blood Pressure (mmHg): 119/53 Reference Range: 80 - 120 mg / dl Electronic Signature(s) Signed: 06/25/2015 3:31:51 PM By: [name] BSN, RN Entered By: [name] on 06/25/2015 15:31:51

## 2015-06-26 NOTE — Progress Notes (Signed)
[name], [name] ([id number]) Visit Report for 06/25/2015 Chief Complaint Document Details Patient Name: [name], [name]. Date of Service: 06/25/2015 3:00 PM Medical Record Number: [id number] Patient Account Number: [id number] Date of Birth/Sex: May 09, 1961 (54 y.o. Male) Treating RN: Baruch Gouty, RN, BSN, Velva Harman Primary Care Physician: Rutherford Nail Other Clinician: Referring Physician: Rutherford Nail Treating Physician/Extender: Frann Rider in Treatment: 6 Information Obtained from: Patient Chief Complaint Patient presents to the wound care center for a consult due non healing wound he had a lacerated wound to his right lateral calf on September 5 of this year. Electronic Signature(s) Signed: 06/25/2015 3:43:37 PM By: Christin Fudge MD, FACS Entered By: Christin Fudge on 06/25/2015 15:43:37 [name], [name] ([id number]) -------------------------------------------------------------------------------- Debridement Details Patient Name: [name]. Date of Service: 06/25/2015 3:00 PM Medical Record Number: [id number] Patient Account Number: [id number] Date of Birth/Sex: Oct 11, 1960 (54 y.o. Male) Treating RN: Afful, RN, BSN, [place] Primary Care Physician: Rutherford Nail Other Clinician: Referring Physician: Rutherford Nail Treating Physician/Extender: Frann Rider in Treatment: 6 Debridement Performed for Wound #1 Right,Medial Lower Leg Assessment: Performed By: Physician Christin Fudge, MD Debridement: Open Wound/Selective Debridement Selective Description: Pre-procedure Yes Verification/Time Out Taken: Start Time: 15:36 Pain Control: Lidocaine 4% Topical Solution Level: Non-Viable Tissue Total Area Debrided (L x 2.5 (cm) x 0.7 (cm) = 1.75 (cm) W): Tissue and other Non-Viable, Fibrin/Slough material debrided: Instrument: Other : gauze and saline Bleeding: Minimum Hemostasis Achieved: Pressure End Time: 15:39 Procedural Pain: 0 Post Procedural Pain:  0 Response to Treatment: Procedure was tolerated well Post Debridement Measurements of Total Wound Length: (cm) 2.5 Width: (cm) 0.7 Depth: (cm) 0.2 Volume: (cm) 0.275 Post Procedure Diagnosis Same as Pre-procedure Electronic Signature(s) Signed: 06/25/2015 3:43:31 PM By: Christin Fudge MD, FACS Signed: 06/25/2015 5:26:04 PM By: Regan Lemming BSN, RN Previous Signature: 06/25/2015 3:39:26 PM Version By: Regan Lemming BSN, RN Entered By: Christin Fudge on 06/25/2015 15:43:31 [name], [name] ([id number]) John, [name] [name] ([id number]) -------------------------------------------------------------------------------- HPI Details Patient Name: [name]. Date of Service: 06/25/2015 3:00 PM Medical Record Number: [id number] Patient Account Number: [id number] Date of Birth/Sex: 1960/10/11 (54 y.o. Male) Treating RN: Baruch Gouty, RN, BSN, Velva Harman Primary Care Physician: Rutherford Nail Other Clinician: Referring Physician: Rutherford Nail Treating Physician/Extender: Frann Rider in Treatment: 6 History of Present Illness Location: injury to the right lateral calf 03/30/2015 Quality: Patient reports experiencing a sharp pain to affected area(s). Severity: Patient states wound (s) are getting better. Duration: Patient has had the wound for < 5 weeks prior to presenting for treatment Timing: Pain in wound is constant (hurts all the time) Context: The wound occurred when the patient injured himself with a sharp object and he had a lacerated wound Modifying Factors: Consults to this date include: 2 rounds of antibiotics which include doxycycline and ciprofloxacin. Associated Signs and Symptoms: Patient reports having difficulty standing for long periods. HPI Description: 54 year old gentleman who is known to have diabetes mellitus, hyperlipidemia, chronic pain syndrome, long-term use of opiate , peripheral artery disease and has had stents placed in lower extremities by Dr. Nicoletta Dress in 2014,  carotid stenosis, coronary artery disease, status post CABG, CHF, COPD, atrial fibrillation, obesity, and cardiomyopathy. He reports that he had a wound on his right leg for which he was put on Cipro and doxycycline. He quit smoking about 18 months ago and prior to that smoked about a pack of cigarettes a day. x-ray of his leg done on 03/30/2015 showed no acute bony abnormality but he did have soft tissue calcification. Review  of his arterial procedure notes revealed that on 05/14/2013 he had an abdominal aortogram with a right lower extremity runoff, angioplasty of the right common femoral artery, angioplasty of the right common iliac artery and angioplasty of the left external iliac artery by Dr. Delana Meyer. 05/19/2015 -- was seen by Dr. Hortencia Pilar on 04/25/2015 and his advice was due to the fact that the patient has increasing symptoms and now having claudication and rest pain he has recommended angiography and intervention. the patient however has not setup his appointment for the stent and I have urged him to do this as soon as possible. 06/04/2015 -- he went back to see Dr. Delana Meyer and after review he has been scheduled for a angiography and intervention this coming Tuesday. 06/11/2015 -- he had an operative procedure done by Dr. Delana Meyer on 06/09/2015 and I have reviewed the notes in Epic. He had a angiography and angioplasty of the right femoral artery and has to see him back in a month's time. 06/25/2015 -- he went to see Dr. Delana Meyer today and got a repeat Doppler study of his arterial tree. from what he tells me there was marginal improvement of the blood flow on the right side and no other surgical procedure was planned at the present time. He will be seen back in 6 months time. The patient also had a echo off his heart done and his ejection fraction is now dropped down significantly as per the patient's description. [name], [name] ([id number]) Electronic Signature(s) Signed:  06/25/2015 3:44:49 PM By: Christin Fudge MD, FACS Entered By: Christin Fudge on 06/25/2015 15:44:49 [name], [name] ([id number]) -------------------------------------------------------------------------------- Physical Exam Details Patient Name: [name] Date of Service: 06/25/2015 3:00 PM Medical Record Number: [id number] Patient Account Number: [id number] Date of Birth/Sex: 06-11-1961 (54 y.o. Male) Treating RN: Baruch Gouty, RN, BSN, Velva Harman Primary Care Physician: Rutherford Nail Other Clinician: Referring Physician: Rutherford Nail Treating Physician/Extender: Frann Rider in Treatment: 6 Constitutional . Pulse regular. Respirations normal and unlabored. Afebrile. . Eyes Nonicteric. Reactive to light. Ears, Nose, Mouth, and Throat Lips, teeth, and gums WNL.Marland Kitchen Moist mucosa without lesions . Neck supple and nontender. No palpable supraclavicular or cervical adenopathy. Normal sized without goiter. Respiratory WNL. No retractions.. Cardiovascular Pedal Pulses WNL. No clubbing, cyanosis or edema. Lymphatic No adneopathy. No adenopathy. No adenopathy. Musculoskeletal Adexa without tenderness or enlargement.. Digits and nails w/o clubbing, cyanosis, infection, petechiae, ischemia, or inflammatory conditions.. Integumentary (Hair, Skin) No suspicious lesions. No crepitus or fluctuance. No peri-wound warmth or erythema. No masses.Marland Kitchen Psychiatric Judgement and insight Intact.. No evidence of depression, anxiety, or agitation.. Notes there is significant amount of slough which was cleansed out with moist saline gauze and a lot of superficial debris was removed. Electronic Signature(s) Signed: 06/25/2015 3:45:20 PM By: Christin Fudge MD, FACS Entered By: Christin Fudge on 06/25/2015 15:45:19 [name], [name] ([id number]) -------------------------------------------------------------------------------- Physician Orders Details Patient Name: [name]. Date of Service:  06/25/2015 3:00 PM Medical Record Number: [id number] Patient Account Number: [id number] Date of Birth/Sex: [birth year] (54 y.o. Male) Treating RN: Baruch Gouty, RN, BSN, Velva Harman Primary Care Physician: Rutherford Nail Other Clinician: Referring Physician: Rutherford Nail Treating Physician/Extender: Frann Rider in Treatment: 6 Verbal / Phone Orders: Yes Clinician: Afful, RN, BSN, Rita Read Back and Verified: Yes Diagnosis Coding Wound Cleansing Wound #1 Right,Medial Lower Leg o Cleanse wound with mild soap and water o May Shower, gently pat wound dry prior to applying new dressing. o May shower with protection. Anesthetic Wound #1 Right,Medial Lower Leg   o Topical Lidocaine 4% cream applied to wound bed prior to debridement Skin Barriers/Peri-Wound Care Wound #1 Right,Medial Lower Leg o Skin Prep Primary Wound Dressing Wound #1 Right,Medial Lower Leg o Prisma Ag Secondary Dressing Wound #1 Right,Medial Lower Leg o Boardered Foam Dressing Dressing Change Frequency Wound #1 Right,Medial Lower Leg o Change dressing every other day. Follow-up Appointments Wound #1 Right,Medial Lower Leg o Return Appointment in 1 week. Electronic Signature(s) Signed: 06/25/2015 3:39:54 PM By: Regan Lemming BSN, RN Signed: 06/25/2015 4:31:17 PM By: Christin Fudge MD, FACS [name] ([id number]) Entered By: Regan Lemming on 06/25/2015 15:39:54 Kunda, [name] ([id number]) -------------------------------------------------------------------------------- Problem List Details Patient Name: RAJAI, WERTMAN. Date of Service: 06/25/2015 3:00 PM Medical Record Number: [id number] Patient Account Number: [id number] Date of Birth/Sex: 05/27/1961 (54 y.o. 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 Date of Service: 06/25/2015 3:00 PM Medical Record Number: [id number] Patient Account Number: [id number] Date of Birth/Sex: 09-21-1960 (54 y.o. Male) Treating RN: Baruch Gouty, RN, BSN, Velva Harman Primary Care Physician: Rutherford Nail Other Clinician: Referring Physician: Rutherford Nail Treating Physician/Extender: Frann Rider in Treatment: 6 Subjective Chief Complaint Information obtained from Patient Patient presents to the wound care center for a consult due non healing wound he had a lacerated wound to his right lateral calf on September 5 of this year. History of Present Illness (HPI) The following HPI elements were documented for the patient's wound: Location: injury to the right lateral calf 03/30/2015 Quality: Patient reports experiencing a sharp pain to affected area(s). Severity: Patient states wound (s) are getting better. Duration: Patient has had the wound for < 5 weeks prior to presenting for treatment Timing: Pain in wound is constant (hurts all the time) Context: The wound occurred when the patient injured himself with a sharp object and he had a lacerated wound Modifying Factors: Consults to this date include: 2 rounds of antibiotics which include doxycycline and ciprofloxacin. Associated  Signs and Symptoms: Patient reports having difficulty standing for long periods. 54 year old gentleman who is known to have diabetes mellitus, hyperlipidemia, chronic pain syndrome, long-term use of opiate , peripheral artery disease and has had stents placed in lower extremities by Dr. Nicoletta Dress in 2014, carotid stenosis, coronary artery disease, status post CABG, CHF, COPD, atrial fibrillation, obesity, and cardiomyopathy. He reports that he had a wound on his right leg for which he was put on Cipro and doxycycline. He quit smoking about 18 months ago and prior to that smoked about a pack of cigarettes a day. x-ray of his leg done on 03/30/2015 showed no acute bony abnormality but he did have soft tissue calcification. Review of his arterial procedure notes revealed that on 05/14/2013 he had an abdominal aortogram with a right lower extremity runoff, angioplasty of the right common femoral artery, angioplasty of the right common iliac artery and angioplasty of the left external iliac artery by Dr. Delana Meyer. 05/19/2015 --  was seen by Dr. Hortencia Pilar on 04/25/2015 and his advice was due to the fact that the patient has increasing symptoms and now having claudication and rest pain he has recommended angiography and intervention. the patient however has not setup his appointment for the stent and I have urged him to do this as soon as possible. 06/04/2015 -- he went back to see Dr. Delana Meyer and after review he has been scheduled for a angiography and intervention this coming Tuesday. 06/11/2015 -- he had an operative procedure done by Dr. Delana Meyer on 06/09/2015 and I have reviewed the [name], [name] ([id number]) notes in [place]. He had a angiography and angioplasty of the right femoral artery and has to see him back in a month's time. 06/25/2015 -- he went to see Dr. Delana Meyer today and got a repeat Doppler study of his arterial tree. from what he tells me there was marginal improvement of the blood  flow on the right side and no other surgical procedure was planned at the present time. He will be seen back in 6 months time. The patient also had a echo off his heart done and his ejection fraction is now dropped down significantly as per the patient's description. Objective Constitutional Pulse regular. Respirations normal and unlabored. Afebrile. Vitals Time Taken: 3:30 PM, Height: 67 in, Weight: 248 lbs, BMI: 38.8, Temperature: 98.1 F, Pulse: 87 bpm, Respiratory Rate: 18 breaths/min, Blood Pressure: 119/53 mmHg. Eyes Nonicteric. Reactive to light. Ears, Nose, Mouth, and Throat Lips, teeth, and gums WNL.Marland Kitchen Moist mucosa without lesions . Neck supple and nontender. No palpable supraclavicular or cervical adenopathy. Normal sized without goiter. Respiratory WNL. No retractions.. Cardiovascular Pedal Pulses WNL. No clubbing, cyanosis or edema. Lymphatic No adneopathy. No adenopathy. No adenopathy. Musculoskeletal Adexa without tenderness or enlargement.. Digits and nails w/o clubbing, cyanosis, infection, petechiae, ischemia, or inflammatory conditions.Marland Kitchen Psychiatric Judgement and insight Intact.. No evidence of depression, anxiety, or agitation.. General Notes: there is significant amount of slough which was cleansed out with moist saline gauze and a [name], [name]. ([id number]) lot of superficial debris was removed. Integumentary (Hair, Skin) No suspicious lesions. No crepitus or fluctuance. No peri-wound warmth or erythema. No masses.. Wound #1 status is Open. Original cause of wound was Trauma. The wound is located on the Right,Medial Lower Leg. The wound measures 2.5cm length x 0.7cm width x 0.2cm depth; 1.374cm^2 area and 0.275cm^3 volume. The wound is limited to skin breakdown. There is no tunneling or undermining noted. There is a medium amount of serous drainage noted. The wound margin is flat and intact. There is small (1-33%) pink granulation within the wound bed. There  is a large (67-100%) amount of necrotic tissue within the wound bed including Adherent Slough. The periwound skin appearance exhibited: Localized Edema, Scarring, Moist. The periwound skin appearance did not exhibit: Callus, Crepitus, Excoriation, Fluctuance, Friable, Induration, Rash, Dry/Scaly, Maceration, Atrophie Blanche, Cyanosis, Ecchymosis, Hemosiderin Staining, Mottled, Pallor, Rubor, Erythema. Periwound temperature was noted as No Abnormality. The periwound has tenderness on palpation. 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 He will continue guarded exercise routine as prescribed to him by the vascular surgeon and see me back next week. Procedures Wound #  1 Wound #1 is a Diabetic Wound/Ulcer of the Lower Extremity located on the Right,Medial Lower Leg . There was a Non-Viable Tissue Open Wound/Selective [id number]) debridement with total area of 1.75 sq cm performed by Christin Fudge, MD. with the following instrument(s): gauze and saline to remove Non-Viable tissue/material including Fibrin/Slough after achieving pain control using Lidocaine 4% Topical Solution. A time out was conducted prior to the start of the procedure. A Minimum amount of bleeding was controlled with Pressure. The procedure was tolerated well with a pain level of 0 throughout and a pain level of 0 following the procedure. Post Debridement Measurements: 2.5cm length x 0.7cm width x 0.2cm depth; [place]ler, [name] ([id number]) 0.275cm^3 volume. Post procedure Diagnosis Wound #1: Same as Pre-Procedure Plan Wound Cleansing: Wound #1  Right,Medial Lower Leg: Cleanse wound with mild soap and water May Shower, gently pat wound dry prior to applying new dressing. May shower with protection. Anesthetic: Wound #1 Right,Medial Lower Leg: Topical Lidocaine 4% cream applied to wound bed prior to debridement Skin Barriers/Peri-Wound Care: Wound #1 Right,Medial Lower Leg: Skin Prep Primary Wound Dressing: Wound #1 Right,Medial Lower Leg: Prisma Ag Secondary Dressing: Wound #1 Right,Medial Lower Leg: Boardered Foam Dressing Dressing Change Frequency: Wound #1 Right,Medial Lower Leg: Change dressing every other day. Follow-up Appointments: Wound #1 Right,Medial Lower Leg: Return Appointment in 1 week. After reviewing his recent vascular reports I have recommended we continue with local care with Prisma AG and a bordered foam dressing. He will continue guarded exercise routine as prescribed to him by the vascular surgeon and see me back next week. Electronic Signature(s) Signed: 06/25/2015 3:46:08 PM By: Christin Fudge MD, FACS [name] ([id number]) Entered By: Christin Fudge on 06/25/2015 15:46:08 [name], [name] ([id number]) -------------------------------------------------------------------------------- SuperBill Details Patient Name: [name] Date of Service: 06/25/2015 Medical Record Number: [id number] Patient Account Number: [id number] Date of Birth/Sex: 07-29-1960 (54 y.o. 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 I70.532 of calf Facility Procedures CPT4: Description  Modifier Quantity Code [id number] - DEBRIDE WOUND 1ST 20 SQ CM OR < 1 ICD-10 Description Diagnosis E11.622 Type 2 diabetes mellitus with other skin ulcer S81.811A Laceration without foreign body, right lower leg, initial encounter  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 Physician Procedures CPT4: Description Modifier Quantity Code [id number] - WC PHYS LEVEL 3 - EST PT 25 1 ICD-10 Description Diagnosis E11.622 Type 2 diabetes mellitus with other skin ulcer S81.811A Laceration without foreign body, right lower leg, initial encounter  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

## 2015-06-30 DIAGNOSIS — E1142 Type 2 diabetes mellitus with diabetic polyneuropathy: Secondary | ICD-10-CM | POA: Insufficient documentation

## 2015-07-02 ENCOUNTER — Encounter: Payer: No Typology Code available for payment source | Admitting: Surgery

## 2015-07-02 DIAGNOSIS — E11622 Type 2 diabetes mellitus with other skin ulcer: Secondary | ICD-10-CM | POA: Diagnosis not present

## 2015-07-03 NOTE — Progress Notes (Signed)
[name], [name] ([id number]) Visit Report for 07/02/2015 Chief Complaint Document Details Patient Name: ELL, [name]. Date of Service: 07/02/2015 2:00 PM Medical Record Number: [id number] Patient Account Number: [id number] Date of Birth/Sex: June 30, 1961 (54 y.o. Male) Treating RN: Baruch Gouty, RN, BSN, Velva Harman Primary Care Physician: Rutherford Nail Other Clinician: Referring Physician: Rutherford Nail Treating Physician/Extender: Frann Rider in Treatment: 7 Information Obtained from: Patient Chief Complaint Patient presents to the wound care center for a consult due non healing wound he had a lacerated wound to his right lateral calf on September 5 of this year. Electronic Signature(s) Signed: 07/02/2015 2:42:34 PM By: Christin Fudge MD, FACS Entered By: Christin Fudge on 07/02/2015 14:42:34 [name], [name] ([id number]) -------------------------------------------------------------------------------- Debridement Details Patient Name: [name]. Date of Service: 07/02/2015 2:00 PM Medical Record Number: [id number] Patient Account Number: [id number] Date of Birth/Sex: [birth year] (54 y.o. Male) Treating RN: Baruch Gouty, RN, BSN, Velva Harman Primary Care Physician: Rutherford Nail Other Clinician: Referring Physician: Rutherford Nail Treating Physician/Extender: Frann Rider in Treatment: 7 Debridement Performed for Wound #1 Right,Medial Lower Leg Assessment: Performed By: Physician Christin Fudge, MD Debridement: Debridement Pre-procedure Yes Verification/Time Out Taken: Start Time: 14:30 Pain Control: Lidocaine 4% Topical Solution Level: Skin/Subcutaneous Tissue Total Area Debrided (L x 2.5 (cm) x 0.5 (cm) = 1.25 (cm) W): Tissue and other Non-Viable, Fibrin/Slough, Subcutaneous material debrided: Instrument: Forceps Bleeding: Minimum Hemostasis Achieved: Pressure End Time: 14:35 Procedural Pain: 0 Post Procedural Pain: 0 Response to Treatment: Procedure was  tolerated well Post Debridement Measurements of Total Wound Length: (cm) 2.5 Width: (cm) 0.5 Depth: (cm) 0.2 Volume: (cm) 0.196 Post Procedure Diagnosis Same as Pre-procedure Electronic Signature(s) Signed: 07/02/2015 2:42:28 PM By: Christin Fudge MD, FACS Signed: 07/02/2015 3:45:43 PM By: Regan Lemming BSN, RN Previous Signature: 07/02/2015 2:33:32 PM Version By: Regan Lemming BSN, RN Entered By: Christin Fudge on 07/02/2015 14:42:28 [name], [name] ([id number]) -------------------------------------------------------------------------------- HPI Details Patient Name: [name]. Date of Service: 07/02/2015 2:00 PM Medical Record Number: [id number] Patient Account Number: [id number] Date of Birth/Sex: January 23, 1961 (54 y.o. Male) Treating RN: Baruch Gouty, RN, BSN, Velva Harman Primary Care Physician: Rutherford Nail Other Clinician: Referring Physician: Rutherford Nail Treating Physician/Extender: Frann Rider in Treatment: 7 History of Present Illness Location: injury to the right lateral calf 03/30/2015 Quality: Patient reports experiencing a sharp pain to affected area(s). Severity: Patient states wound (s) are getting better. Duration: Patient has had the wound for < 5 weeks prior to presenting for treatment Timing: Pain in wound is constant (hurts all the time) Context: The wound occurred when the patient injured himself with a sharp object and he had a lacerated wound Modifying Factors: Consults to this date include: 2 rounds of antibiotics which include doxycycline and ciprofloxacin. Associated Signs and Symptoms: Patient reports having difficulty standing for long periods. HPI Description: 54 year old gentleman who is known to have diabetes mellitus, hyperlipidemia, chronic pain syndrome, long-term use of opiate , peripheral artery disease and has had stents placed in lower extremities by Dr. Nicoletta Dress in 2014, carotid stenosis, coronary artery disease, status post CABG, CHF, COPD,  atrial fibrillation, obesity, and cardiomyopathy. He reports that he had a wound on his right leg for which he was put on Cipro and doxycycline. He quit smoking about 18 months ago and prior to that smoked about a pack of cigarettes a day. x-ray of his leg done on 03/30/2015 showed no acute bony abnormality but he did have soft tissue calcification. Review of his arterial procedure notes revealed that on 05/14/2013 he had  an abdominal aortogram with a right lower extremity runoff, angioplasty of the right common femoral artery, angioplasty of the right common iliac artery and angioplasty of the left external iliac artery by Dr. Delana Meyer. 05/19/2015 -- was seen by Dr. Hortencia Pilar on 04/25/2015 and his advice was due to the fact that the patient has increasing symptoms and now having claudication and rest pain he has recommended angiography and intervention. the patient however has not setup his appointment for the stent and I have urged him to do this as soon as possible. 06/04/2015 -- he went back to see Dr. Delana Meyer and after review he has been scheduled for a angiography and intervention this coming Tuesday. 06/11/2015 -- he had an operative procedure done by Dr. Delana Meyer on 06/09/2015 and I have reviewed the notes in [facility]. He had a angiography and angioplasty of the right femoral artery and has to see him back in a month's time. 06/25/2015 -- he went to see Dr. Delana Meyer today and got a repeat Doppler study of his arterial tree. from what he tells me there was marginal improvement of the blood flow on the right side and no other surgical procedure was planned at the present time. He will be seen back in 6 months time. The patient also had a echo off his heart done and his ejection fraction is now dropped down significantly as per the patient's description. [name] ([id number]) Electronic Signature(s) Signed: 07/02/2015 2:42:45 PM By: Christin Fudge MD, FACS Entered By: Christin Fudge  on 07/02/2015 14:42:44 [name], [name] ([id number]) -------------------------------------------------------------------------------- Physical Exam Details Patient Name: [name] Date of Service: 07/02/2015 2:00 PM Medical Record Number: [id number] Patient Account Number: [id number] Date of Birth/Sex: Mar 04, 1961 (54 y.o. Male) Treating RN: Baruch Gouty, RN, BSN, Velva Harman Primary Care Physician: Rutherford Nail Other Clinician: Referring Physician: Rutherford Nail Treating Physician/Extender: Frann Rider in Treatment: 7 Constitutional . Pulse regular. Respirations normal and unlabored. Afebrile. . Eyes Nonicteric. Reactive to light. Ears, Nose, Mouth, and Throat Lips, teeth, and gums WNL.Marland Kitchen Moist mucosa without lesions . Neck supple and nontender. No palpable supraclavicular or cervical adenopathy. Normal sized without goiter. Respiratory WNL. No retractions.. Cardiovascular Pedal Pulses WNL. No clubbing, cyanosis or edema. Lymphatic No adneopathy. No adenopathy. No adenopathy. Musculoskeletal Adexa without tenderness or enlargement.. Digits and nails w/o clubbing, cyanosis, infection, petechiae, ischemia, or inflammatory conditions.. Integumentary (Hair, Skin) No suspicious lesions. No crepitus or fluctuance. No peri-wound warmth or erythema. No masses.Marland Kitchen Psychiatric Judgement and insight Intact.. No evidence of depression, anxiety, or agitation.. Notes the wound in the right lower extremity is doing overall better but there continues to be some slough which are sharply debrided with a curet down to the subcutaneous tissue Electronic Signature(s) Signed: 07/02/2015 2:43:27 PM By: Christin Fudge MD, FACS Entered By: Christin Fudge on 07/02/2015 14:43:26 [name], [name] ([id number]) -------------------------------------------------------------------------------- Physician Orders Details Patient Name: [name]. Date of Service: 07/02/2015 2:00 PM Medical Record  Number: [id number] Patient Account Number: [id number] Date of Birth/Sex: 05-17-1961 (54 y.o. Male) Treating RN: Baruch Gouty, RN, BSN, Velva Harman Primary Care Physician: Rutherford Nail Other Clinician: Referring Physician: Rutherford Nail Treating Physician/Extender: Frann Rider in Treatment: 7 Verbal / Phone Orders: Yes Clinician: Afful, RN, BSN, Rita Read Back and Verified: Yes Diagnosis Coding Wound Cleansing Wound #1 Right,Medial Lower Leg o Cleanse wound with mild soap and water o May Shower, gently pat wound dry prior to applying new dressing. o May shower with protection. Anesthetic Wound #1 Right,Medial Lower Leg o Topical Lidocaine  4% cream applied to wound bed prior to debridement Skin Barriers/Peri-Wound Care Wound #1 Right,Medial Lower Leg o Skin Prep Primary Wound Dressing Wound #1 Right,Medial Lower Leg o Prisma Ag Secondary Dressing Wound #1 Right,Medial Lower Leg o Boardered Foam Dressing Dressing Change Frequency Wound #1 Right,Medial Lower Leg o Change dressing every other day. Follow-up Appointments Wound #1 Right,Medial Lower Leg o Return Appointment in 1 week. Edema Control Wound #1 Right,Medial Lower Leg o Tubigrip [name], [name] ([id number]) Electronic Signature(s) Signed: 07/02/2015 2:32:20 PM By: Regan Lemming BSN, RN Signed: 07/02/2015 3:39:43 PM By: Christin Fudge MD, FACS Entered By: Regan Lemming on 07/02/2015 14:32:20 [name], [name] ([id number]) -------------------------------------------------------------------------------- Problem List Details Patient Name: [name]. Date of Service: 07/02/2015 2:00 PM Medical Record Number: [id number] Patient Account Number: [id number] Date of Birth/Sex: 1961-04-16 (54 y.o. 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 Date of Service: 07/02/2015 2:00 PM Medical Record Number: [id number] Patient Account Number: [id number] Date of Birth/Sex: May 08, 1961 (54 y.o. Male) Treating RN: Baruch Gouty, RN, BSN, Velva Harman Primary Care Physician: Rutherford Nail Other Clinician: Referring Physician: Rutherford Nail Treating Physician/Extender: Frann Rider in Treatment: 7 Subjective Chief Complaint Information obtained from Patient Patient presents to the wound care center for a consult due non healing wound he had a lacerated wound to his right lateral calf on September 5 of this year. History of Present Illness (HPI) The following HPI elements were documented for the patient's wound: Location: injury to the right lateral calf 03/30/2015 Quality: Patient reports experiencing a sharp pain to affected area(s). Severity: Patient states wound (s) are getting better. Duration: Patient has had the wound for < 5 weeks prior to presenting for treatment Timing: Pain in wound is constant (hurts all the time) Context: The wound occurred when the patient injured himself with a sharp object and he had a lacerated wound Modifying Factors: Consults to this date include: 2 rounds of antibiotics which include doxycycline and ciprofloxacin. Associated  Signs and Symptoms: Patient reports having difficulty standing for long periods. 54 year old gentleman who is known to have diabetes mellitus, hyperlipidemia, chronic pain syndrome, long-term use of opiate , peripheral artery disease and has had stents placed in lower extremities by Dr. Nicoletta Dress in 2014, carotid stenosis, coronary artery disease, status post CABG, CHF, COPD, atrial fibrillation, obesity, and cardiomyopathy. He reports that he had a wound on his right leg for which he was put on Cipro and doxycycline. He quit smoking about 18 months ago and prior to that smoked about a pack of cigarettes a day. x-ray of his leg done on 03/30/2015 showed no acute bony abnormality but he did have soft tissue calcification. Review of his arterial procedure notes revealed that on 05/14/2013 he had an abdominal aortogram with a right lower extremity runoff, angioplasty of the right common femoral artery, angioplasty of the right common iliac artery and angioplasty of the left external iliac  artery by Dr. Delana Meyer. 05/19/2015 -- was seen by Dr. Hortencia Pilar on 04/25/2015 and his advice was due to the fact that the patient has increasing symptoms and now having claudication and rest pain he has recommended angiography and intervention. the patient however has not setup his appointment for the stent and I have urged him to do this as soon as possible. 06/04/2015 -- he went back to see Dr. Delana Meyer and after review he has been scheduled for a angiography and intervention this coming Tuesday. 06/11/2015 -- he had an operative procedure done by Dr. Delana Meyer on 06/09/2015 and I have reviewed the [name], [name] ([id number]) notes in [facility]. He had a angiography and angioplasty of the right femoral artery and has to see him back in a month's time. 06/25/2015 -- he went to see Dr. Delana Meyer today and got a repeat Doppler study of his arterial tree. from what he tells me there was marginal improvement of the blood  flow on the right side and no other surgical procedure was planned at the present time. He will be seen back in 6 months time. The patient also had a echo off his heart done and his ejection fraction is now dropped down significantly as per the patient's description. Objective Constitutional Pulse regular. Respirations normal and unlabored. Afebrile. Vitals Time Taken: 2:26 PM, Height: 67 in, Weight: 248 lbs, BMI: 38.8, Temperature: 97.5 F, Pulse: 93 bpm, Respiratory Rate: 18 breaths/min, Blood Pressure: 123/79 mmHg. Eyes Nonicteric. Reactive to light. Ears, Nose, Mouth, and Throat Lips, teeth, and gums WNL.Marland Kitchen Moist mucosa without lesions . Neck supple and nontender. No palpable supraclavicular or cervical adenopathy. Normal sized without goiter. Respiratory WNL. No retractions.. Cardiovascular Pedal Pulses WNL. No clubbing, cyanosis or edema. Lymphatic No adneopathy. No adenopathy. No adenopathy. Musculoskeletal Adexa without tenderness or enlargement.. Digits and nails w/o clubbing, cyanosis, infection, petechiae, ischemia, or inflammatory conditions.Marland Kitchen Psychiatric Judgement and insight Intact.. No evidence of depression, anxiety, or agitation.. General Notes: the wound in the right lower extremity is doing overall better but there continues to be some [name], [name]. ([id number]) slough which are sharply debrided with a curet down to the subcutaneous tissue Integumentary (Hair, Skin) No suspicious lesions. No crepitus or fluctuance. No peri-wound warmth or erythema. No masses.. Wound #1 status is Open. Original cause of wound was Trauma. The wound is located on the Right,Medial Lower Leg. The wound measures 2.5cm length x 0.5cm width x 0.2cm depth; 0.982cm^2 area and 0.196cm^3 volume. The wound is limited to skin breakdown. There is no tunneling or undermining noted. There is a medium amount of serous drainage noted. The wound margin is flat and intact. There is large (67-100%)  pink granulation within the wound bed. There is a small (1-33%) amount of necrotic tissue within the wound bed including Adherent Slough. The periwound skin appearance exhibited: Localized Edema, Scarring, Moist. The periwound skin appearance did not exhibit: Callus, Crepitus, Excoriation, Fluctuance, Friable, Induration, Rash, Dry/Scaly, Maceration, Atrophie Blanche, Cyanosis, Ecchymosis, Hemosiderin Staining, Mottled, Pallor, Rubor, Erythema. Periwound temperature was noted as No Abnormality. The periwound has tenderness on palpation. 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 There was a Skin/Subcutaneous Tissue Debridement [id number]) debridement with total  area of 1.25 sq cm performed by Christin Fudge, MD. with the following instrument(s): Forceps to remove Non-Viable tissue/material including Fibrin/Slough and Subcutaneous after achieving pain control using Lidocaine 4% Topical Solution. A time out was conducted prior to the start of the procedure. A Minimum amount of bleeding was controlled with Pressure. The procedure was tolerated well with a pain level of 0 throughout and a pain level of 0 following the procedure. Post Debridement Measurements: 2.5cm length x 0.5cm width x 0.2cm depth; 0.196cm^3 volume. Post procedure Diagnosis Wound #1: Same as Pre-Procedure [name], [name] ([id number]) Plan Wound Cleansing: Wound #1 Right,Medial Lower Leg: Cleanse wound with mild soap and water May Shower, gently pat wound dry prior to applying new dressing. May shower with protection. Anesthetic: Wound #1 Right,Medial Lower Leg: Topical  Lidocaine 4% cream applied to wound bed prior to debridement Skin Barriers/Peri-Wound Care: Wound #1 Right,Medial Lower Leg: Skin Prep Primary Wound Dressing: Wound #1 Right,Medial Lower Leg: Prisma Ag Secondary Dressing: Wound #1 Right,Medial Lower Leg: Boardered Foam Dressing Dressing Change Frequency: Wound #1 Right,Medial Lower Leg: Change dressing every other day. Follow-up Appointments: Wound #1 Right,Medial Lower Leg: Return Appointment in 1 week. Edema Control: Wound #1 Right,Medial Lower Leg: Tubigrip I have recommended we continue with local care with Prisma AG and a bordered foam dressing. He will continue guarded exercise routine as prescribed to him by the vascular surgeon and see me back next week. Electronic Signature(s) Signed: 07/02/2015 2:44:00 PM By: Christin Fudge MD, FACS Entered By: Christin Fudge on 07/02/2015 14:44:00 [name], [name] ([id number]) -------------------------------------------------------------------------------- SuperBill Details Patient Name: [name] Date of Service: 07/02/2015 Medical Record Number: [id number] Patient Account Number: [id number] Date of Birth/Sex: [birth year] (54 y.o. 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 I70.532 of calf Facility Procedures CPT4: Description Modifier Quantity Code [id number] - DEB SUBQ TISSUE 20 SQ CM/< 1 ICD-10 Description Diagnosis E11.622 Type 2 diabetes mellitus with other skin ulcer S81.811A Laceration without foreign body, right lower leg, initial encounter   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

## 2015-07-03 NOTE — Progress Notes (Signed)
[name], [name] ([id number]) Visit Report for 07/02/2015 Arrival Information Details Patient Name: [name], [name]. Date of Service: 07/02/2015 2:00 PM Medical Record Number: [id number] Patient Account Number: [id number] Date of Birth/Sex: [birth year] (54 y.o. Male) Treating RN: Baruch Gouty, RN, BSN, Velva Harman Primary Care Physician: Rutherford Nail Other Clinician: Referring Physician: Rutherford Nail Treating Physician/Extender: Frann Rider in Treatment: 7 Visit Information History Since Last Visit Added or deleted any medications: No Patient Arrived: Ambulatory Had a fall or experienced change in No Arrival Time: 14:20 activities of daily living that may affect Accompanied By: self risk of falls: Transfer Assistance: None Signs or symptoms of abuse/neglect since last No Patient Identification Verified: Yes visito Secondary Verification Process Yes Hospitalized since last visit: No Completed: Has Dressing in Place as Prescribed: Yes Patient Requires Transmission- No Pain Present Now: No Based Precautions: Patient Has Alerts: Yes Patient Alerts: Patient on Blood Thinner Type II Diabetic Electronic Signature(s) Signed: 07/02/2015 2:21:14 PM By: Regan Lemming BSN, RN Entered By: Regan Lemming on 07/02/2015 14:21:13 Liford, [name] ([id number]) -------------------------------------------------------------------------------- Encounter Discharge Information Details Patient Name: [name]. Date of Service: 07/02/2015 2:00 PM Medical Record Number: [id number] Patient Account Number: [id number] Date of Birth/Sex: May 07, 1961 (54 y.o. Male) Treating RN: Baruch Gouty, RN, BSN, Velva Harman Primary Care Physician: Rutherford Nail Other Clinician: Referring Physician: Rutherford Nail Treating Physician/Extender: Frann Rider in Treatment: 7 Encounter Discharge Information Items Discharge Pain Level: 0 Discharge Condition: Stable Ambulatory Status: Ambulatory Discharge Destination:  Home Transportation: Private Auto Accompanied By: self Schedule Follow-up Appointment: No Medication Reconciliation completed and provided to Patient/Care No Starlina Lapre: Provided on Clinical Summary of Care: 07/02/2015 Form Type Recipient Paper Patient [employer] Electronic Signature(s) Signed: 07/02/2015 2:38:23 PM By: Ruthine Dose Previous Signature: 07/02/2015 2:34:21 PM Version By: Regan Lemming BSN, RN Entered By: Ruthine Dose on 07/02/2015 14:38:22 Fraser, [name] ([id number]) -------------------------------------------------------------------------------- Lower Extremity Assessment Details Patient Name: [name]. Date of Service: 07/02/2015 2:00 PM Medical Record Number: [id number] Patient Account Number: [id number] Date of Birth/Sex: [birth year] (54 y.o. Male) Treating RN: Baruch Gouty, RN, BSN, Velva Harman Primary Care Physician: Rutherford Nail Other Clinician: Referring Physician: Rutherford Nail Treating Physician/Extender: Frann Rider in Treatment: 7 Vascular Assessment Pulses: Posterior Tibial Dorsalis Pedis Palpable: [Right:Yes] Extremity colors, hair growth, and conditions: Extremity Color: [Right:Normal] Hair Growth on Extremity: [Right:Yes] Temperature of Extremity: [Right:Warm] Capillary Refill: [Right:< 3 seconds] Toe Nail Assessment Left: Right: Thick: No Discolored: No Deformed: No Improper Length and Hygiene: No Electronic Signature(s) Signed: 07/02/2015 2:21:43 PM By: Regan Lemming BSN, RN Entered By: Regan Lemming on 07/02/2015 14:21:42 Mcisaac, [name] ([id number]) -------------------------------------------------------------------------------- Multi Wound Chart Details Patient Name: [name]. Date of Service: 07/02/2015 2:00 PM Medical Record Number: [id number] Patient Account Number: [id number] Date of Birth/Sex: 1961/01/20 (53 y.o. Male) Treating RN: Baruch Gouty, RN, BSN, Velva Harman Primary Care Physician: Rutherford Nail Other Clinician: Referring  Physician: Rutherford Nail Treating Physician/Extender: Frann Rider in Treatment: 7 Vital Signs Height(in): 67 Pulse(bpm): 93 Weight(lbs): 248 Blood Pressure 123/79 (mmHg): Body Mass Index(BMI): 39 Temperature(F): 97.5 Respiratory Rate 18 (breaths/min): Photos: [1:No Photos] [N/A:N/A] Wound Location: [1:Right Lower Leg - Medial] [N/A:N/A] Wounding Event: [1:Trauma] [N/A:N/A] Primary Etiology: [1:Diabetic Wound/Ulcer of the Lower Extremity] [N/A:N/A] Comorbid History: [1:Chronic Obstructive Pulmonary Disease (COPD), Congestive Heart Failure, Coronary Artery Disease, Myocardial Infarction, Peripheral Arterial Disease, Peripheral Venous Disease, Type II Diabetes, Neuropathy] [N/A:N/A] Date Acquired: [1:03/30/2015] [N/A:N/A] Weeks of Treatment: [1:7] [N/A:N/A] Wound Status: [1:Open] [N/A:N/A] Measurements L x W x D 2.5x0.5x0.2 [N/A:N/A] (cm) Area (cm) : [1:0.982] [N/A:N/A] Volume (  cm) : [1:0.196] [N/A:N/A] % Reduction in Area: [1:87.00%] [N/A:N/A] % Reduction in Volume: 93.50% [N/A:N/A] Classification: [1:Grade 2] [N/A:N/A] Exudate Amount: [1:Medium] [N/A:N/A] Exudate Type: [1:Serous] [N/A:N/A] Exudate Color: [1:amber] [N/A:N/A] Wound Margin: [1:Flat and Intact] [N/A:N/A] Granulation Amount: [1:Large (67-100%)] [N/A:N/A] Granulation Quality: Pink N/A N/A Necrotic Amount: Small (1-33%) N/A N/A Exposed Structures: Fascia: No N/A N/A Fat: No Tendon: No Muscle: No Joint: No Bone: No Limited to Skin Breakdown Epithelialization: Medium (34-66%) N/A N/A Periwound Skin Texture: Edema: Yes N/A N/A Scarring: Yes Excoriation: No Induration: No Callus: No Crepitus: No Fluctuance: No Friable: No Rash: No Periwound Skin Moist: Yes N/A N/A Moisture: Maceration: No Dry/Scaly: No Periwound Skin Color: Atrophie Blanche: No N/A N/A Cyanosis: No Ecchymosis: No Erythema: No Hemosiderin Staining: No Mottled: No Pallor: No Rubor: No Temperature: No Abnormality N/A  N/A Tenderness on Yes N/A N/A Palpation: Wound Preparation: Ulcer Cleansing: N/A N/A Rinsed/Irrigated with Saline Topical Anesthetic Applied: Other: lidocaine 4% Treatment Notes Electronic Signature(s) Signed: 07/02/2015 2:27:26 PM By: Regan Lemming BSN, RN Entered By: Regan Lemming on 07/02/2015 14:27:26 [name], [name] ([id number]) -------------------------------------------------------------------------------- [place] Details Patient Name: [name]. Date of Service: 07/02/2015 2:00 PM Medical Record Number: [id number] Patient Account Number: [id number] Date of Birth/Sex: [birth year] (54 y.o. Male) Treating RN: Baruch Gouty, RN, BSN, Velva Harman Primary Care Physician: Rutherford Nail Other Clinician: Referring Physician: Rutherford Nail Treating Physician/Extender: Frann Rider in Treatment: 7 Active Inactive Abuse / Safety / Falls / Self Care Management Nursing Diagnoses: Potential for falls Self care deficit: actual or potential Goals: Patient will remain injury free Date Initiated: 05/12/2015 Goal Status: Active Interventions: Assess fall risk on admission and as needed Notes: Orientation to the Wound Care Program Nursing Diagnoses: Knowledge deficit related to the wound healing center program Goals: Patient/caregiver will verbalize understanding of the [place] Program Date Initiated: 05/12/2015 Goal Status: Active Interventions: Provide education on orientation to the wound center Notes: Soft Tissue Infection Nursing Diagnoses: Potential for infection: soft tissue Goals: Patient will remain free of wound infection DEVVON, ARNWINE ([id number]) Date Initiated: 05/12/2015 Goal Status: Active Signs and symptoms of infection will be recognized early to allow for prompt treatment Date Initiated: 05/12/2015 Goal Status: Active Interventions: Assess signs and symptoms of infection every visit Treatment Activities: Systemic  antibiotics : 07/02/2015 Notes: Wound/Skin Impairment Nursing Diagnoses: Impaired tissue integrity Goals: Ulcer/skin breakdown will heal within 14 weeks Date Initiated: 05/12/2015 Goal Status: Active Interventions: Assess ulceration(s) every visit Treatment Activities: Topical wound management initiated : 07/02/2015 Notes: Electronic Signature(s) Signed: 07/02/2015 2:27:17 PM By: Regan Lemming BSN, RN Entered By: Regan Lemming on 07/02/2015 14:27:17 [name], [name] ([id number]) -------------------------------------------------------------------------------- Pain Assessment Details Patient Name: [name]. Date of Service: 07/02/2015 2:00 PM Medical Record Number: [id number] Patient Account Number: [id number] Date of Birth/Sex: [birth year] (54 y.o. Male) Treating RN: Baruch Gouty, RN, BSN, Velva Harman Primary Care Physician: Rutherford Nail Other Clinician: Referring Physician: Rutherford Nail Treating Physician/Extender: Frann Rider in Treatment: 7 Active Problems Location of Pain Severity and Description of Pain Patient Has Paino No Site Locations Pain Management and Medication Current Pain Management: Electronic Signature(s) Signed: 07/02/2015 2:21:19 PM By: Regan Lemming BSN, RN Entered By: Regan Lemming on 07/02/2015 14:21:19 [name], [name] ([id number]) -------------------------------------------------------------------------------- Patient/Caregiver Education Details Patient Name: [name] Date of Service: 07/02/2015 2:00 PM Medical Record Number: [id number] Patient Account Number: [id number] Date of Birth/Gender: 09-13-1960 (54 y.o. Male) Treating RN: Baruch Gouty, RN, BSN, Velva Harman Primary Care Physician: Rutherford Nail Other Clinician: Referring Physician: Rutherford Nail  Treating Physician/Extender: Frann Rider in Treatment: 7 Education Assessment Education Provided To: Patient Education Topics Provided Welcome To [facility]: Methods:  Explain/Verbal Responses: State content correctly Wound Debridement: Methods: Explain/Verbal Responses: State content correctly Electronic Signature(s) Signed: 07/02/2015 2:34:35 PM By: Regan Lemming BSN, RN Entered By: Regan Lemming on 07/02/2015 14:34:35 [name], [name] ([id number]) -------------------------------------------------------------------------------- Wound Assessment Details Patient Name: [name]. Date of Service: 07/02/2015 2:00 PM Medical Record Number: [id number] Patient Account Number: [id number] Date of Birth/Sex: 10-09-1960 (54 y.o. Male) Treating RN: Baruch Gouty, RN, BSN, [place] Primary Care Physician: Rutherford Nail Other Clinician: Referring Physician: Rutherford Nail Treating Physician/Extender: Frann Rider in Treatment: 7 Wound Status Wound Number: 1 Primary Diabetic Wound/Ulcer of the Lower Etiology: Extremity Wound Location: Right Lower Leg - Medial Wound Open Wounding Event: Trauma Status: Date Acquired: 03/30/2015 Comorbid Chronic Obstructive Pulmonary Disease Weeks Of Treatment: 7 History: (COPD), Congestive Heart Failure, Clustered Wound: No Coronary Artery Disease, Myocardial Infarction, Peripheral Arterial Disease, Peripheral Venous Disease, Type II Diabetes, Neuropathy Photos Photo Uploaded By: Regan Lemming on 07/02/2015 15:45:17 Wound Measurements Length: (cm) 2.5 Width: (cm) 0.5 Depth: (cm) 0.2 Area: (cm) 0.982 Volume: (cm) 0.196 % Reduction in Area: 87% % Reduction in Volume: 93.5% Epithelialization: Medium (34-66%) Tunneling: No Undermining: No Wound Description Classification: Grade 2 Foul Odor Aft Wound Margin: Flat and Intact Exudate Amount: Medium Exudate Type: Serous Exudate Color: amber er Cleansing: No Wound Bed Granulation Amount: Large (67-100%) Exposed Structure Diaz, [name] ([id number]) Granulation Quality: Pink Fascia Exposed: No Necrotic Amount: Small (1-33%) Fat Layer Exposed: No Necrotic  Quality: Adherent Slough Tendon Exposed: No Muscle Exposed: No Joint Exposed: No Bone Exposed: No Limited to Skin Breakdown Periwound Skin Texture Texture Color No Abnormalities Noted: No No Abnormalities Noted: No Callus: No Atrophie Blanche: No Crepitus: No Cyanosis: No Excoriation: No Ecchymosis: No Fluctuance: No Erythema: No Friable: No Hemosiderin Staining: No Induration: No Mottled: No Localized Edema: Yes Pallor: No Rash: No Rubor: No Scarring: Yes Temperature / Pain Moisture Temperature: No Abnormality No Abnormalities Noted: No Tenderness on Palpation: Yes Dry / Scaly: No Maceration: No Moist: Yes Wound Preparation Ulcer Cleansing: Rinsed/Irrigated with Saline Topical Anesthetic Applied: Other: lidocaine 4%, Treatment Notes Wound #1 (Right, Medial Lower Leg) 1. Cleansed with: Clean wound with Normal Saline 4. Dressing Applied: Prisma Ag 5. Secondary Dressing Applied Bordered Foam Dressing 7. Secured with [employer] Electronic Signature(s) Signed: 07/02/2015 2:26:24 PM By: Regan Lemming BSN, RN Entered By: Regan Lemming on 07/02/2015 [id number] [name] ([id number]) -------------------------------------------------------------------------------- Vitals Details Patient Name: [name]. Date of Service: 07/02/2015 2:00 PM Medical Record Number: [id number] Patient Account Number: [id number] Date of Birth/Sex: 07-17-1961 (54 y.o. Male) Treating RN: Baruch Gouty, RN, BSN, [place] Primary Care Physician: Rutherford Nail Other Clinician: Referring Physician: Rutherford Nail Treating Physician/Extender: Frann Rider in Treatment: 7 Vital Signs Time Taken: 14:26 Temperature (F): 97.5 Height (in): 67 Pulse (bpm): 93 Weight (lbs): 248 Respiratory Rate (breaths/min): 18 Body Mass Index (BMI): 38.8 Blood Pressure (mmHg): 123/79 Reference Range: 80 - 120 mg / dl Electronic Signature(s) Signed: 07/02/2015 2:26:50 PM By: Regan Lemming BSN,  RN Entered By: Regan Lemming on 07/02/2015 14:26:49

## 2015-07-09 ENCOUNTER — Ambulatory Visit
Admission: RE | Admit: 2015-07-09 | Discharge: 2015-07-09 | Disposition: A | Discharge status: Home / Self Care | Payer: No Typology Code available for payment source | Source: Ambulatory Visit | Attending: Specialist | Admitting: Specialist

## 2015-07-09 ENCOUNTER — Encounter: Payer: No Typology Code available for payment source | Admitting: Surgery

## 2015-07-09 DIAGNOSIS — E11622 Type 2 diabetes mellitus with other skin ulcer: Secondary | ICD-10-CM | POA: Diagnosis not present

## 2015-07-09 DIAGNOSIS — R918 Other nonspecific abnormal finding of lung field: Secondary | ICD-10-CM | POA: Diagnosis not present

## 2015-07-10 NOTE — Progress Notes (Signed)
KEYMANI, BETTEN ([id number]) Visit Report for 07/09/2015 Chief Complaint Document Details Patient Name: [name], [name]. Date of Service: 07/09/2015 2:45 PM Medical Record Number: [id number] Patient Account Number: [id number] Date of Birth/Sex: 04-Oct-1960 (54 y.o. Male) Treating RN: Baruch Gouty, RN, BSN, Velva Harman Primary Care Physician: Rutherford Nail Other Clinician: Referring Physician: Rutherford Nail Treating Physician/Extender: Frann Rider in Treatment: 8 Information Obtained from: Patient Chief Complaint Patient presents to the wound care center for a consult due non healing wound he had a lacerated wound to his right lateral calf on September 5 of this year. Electronic Signature(s) Signed: 07/09/2015 3:09:41 PM By: Christin Fudge MD, FACS Entered By: Christin Fudge on 07/09/2015 15:09:41 Flessner, [name] ([id number]) -------------------------------------------------------------------------------- Debridement Details Patient Name: [name]. Date of Service: 07/09/2015 2:45 PM Medical Record Number: [id number] Patient Account Number: [id number] Date of Birth/Sex: 1961/07/18 (54 y.o. Male) Treating RN: Afful, RN, BSN, [place] Primary Care Physician: Rutherford Nail Other Clinician: Referring Physician: Rutherford Nail Treating Physician/Extender: Frann Rider in Treatment: 8 Debridement Performed for Wound #1 Right,Medial Lower Leg Assessment: Performed By: Physician Christin Fudge, MD Debridement: Open Wound/Selective Debridement Selective Description: Pre-procedure Yes Verification/Time Out Taken: Start Time: 15:00 Pain Control: Lidocaine 4% Topical Solution Level: Skin/Dermis Total Area Debrided (L x 1.5 (cm) x 0.5 (cm) = 0.75 (cm) W): Tissue and other Non-Viable, Eschar, Exudate, Fibrin/Slough material debrided: Instrument: Forceps Bleeding: Minimum Hemostasis Achieved: Pressure End Time: 15:05 Procedural Pain: 0 Post Procedural Pain:  0 Response to Treatment: Procedure was tolerated well Post Debridement Measurements of Total Wound Length: (cm) 1.5 Width: (cm) 0.2 Depth: (cm) 0.1 Volume: (cm) 0.024 Post Procedure Diagnosis Same as Pre-procedure Electronic Signature(s) Signed: 07/09/2015 3:09:34 PM By: Christin Fudge MD, FACS Signed: 07/09/2015 5:19:40 PM By: Regan Lemming BSN, RN Entered By: Christin Fudge on 07/09/2015 15:09:34 [name], [name] ([id number]) -------------------------------------------------------------------------------- HPI Details Patient Name: [name]. Date of Service: 07/09/2015 2:45 PM Medical Record Number: [id number] Patient Account Number: [id number] Date of Birth/Sex: Nov 24, 1960 (54 y.o. Male) Treating RN: Baruch Gouty, RN, BSN, Velva Harman Primary Care Physician: Rutherford Nail Other Clinician: Referring Physician: Rutherford Nail Treating Physician/Extender: Frann Rider in Treatment: 8 History of Present Illness Location: injury to the right lateral calf 03/30/2015 Quality: Patient reports experiencing a sharp pain to affected area(s). Severity: Patient states wound (s) are getting better. Duration: Patient has had the wound for < 5 weeks prior to presenting for treatment Timing: Pain in wound is constant (hurts all the time) Context: The wound occurred when the patient injured himself with a sharp object and he had a lacerated wound Modifying Factors: Consults to this date include: 2 rounds of antibiotics which include doxycycline and ciprofloxacin. Associated Signs and Symptoms: Patient reports having difficulty standing for long periods. HPI Description: 54 year old gentleman who is known to have diabetes mellitus, hyperlipidemia, chronic pain syndrome, long-term use of opiate , peripheral artery disease and has had stents placed in lower extremities by Dr. Nicoletta Dress in 2014, carotid stenosis, coronary artery disease, status post CABG, CHF, COPD, atrial fibrillation, obesity,  and cardiomyopathy. He reports that he had a wound on his right leg for which he was put on Cipro and doxycycline. He quit smoking about 18 months ago and prior to that smoked about a pack of cigarettes a day. x-ray of his leg done on 03/30/2015 showed no acute bony abnormality but he did have soft tissue calcification. Review of his arterial procedure notes revealed that on 05/14/2013 he had an abdominal aortogram with a right lower  extremity runoff, angioplasty of the right common femoral artery, angioplasty of the right common iliac artery and angioplasty of the left external iliac artery by Dr. Delana Meyer. 05/19/2015 -- was seen by Dr. Hortencia Pilar on 04/25/2015 and his advice was due to the fact that the patient has increasing symptoms and now having claudication and rest pain he has recommended angiography and intervention. the patient however has not setup his appointment for the stent and I have urged him to do this as soon as possible. 06/04/2015 -- he went back to see Dr. Delana Meyer and after review he has been scheduled for a angiography and intervention this coming Tuesday. 06/11/2015 -- he had an operative procedure done by Dr. Delana Meyer on 06/09/2015 and I have reviewed the notes in Epic. He had a angiography and angioplasty of the right femoral artery and has to see him back in a month's time. 06/25/2015 -- he went to see Dr. Delana Meyer today and got a repeat Doppler study of his arterial tree. from what he tells me there was marginal improvement of the blood flow on the right side and no other surgical procedure was planned at the present time. He will be seen back in 6 months time. The patient also had a echo off his heart done and his ejection fraction is now dropped down significantly as per the patient's description. [name], [name] ([id number]) Electronic Signature(s) Signed: 07/09/2015 3:09:47 PM By: Christin Fudge MD, FACS Entered By: Christin Fudge on 07/09/2015  15:09:47 [name], [name] ([id number]) -------------------------------------------------------------------------------- Physical Exam Details Patient Name: [name] Date of Service: 07/09/2015 2:45 PM Medical Record Number: [id number] Patient Account Number: [id number] Date of Birth/Sex: 01-24-1961 (54 y.o. Male) Treating RN: Baruch Gouty, RN, BSN, Velva Harman Primary Care Physician: Rutherford Nail Other Clinician: Referring Physician: Rutherford Nail Treating Physician/Extender: Frann Rider in Treatment: 8 Constitutional . Pulse regular. Respirations normal and unlabored. Afebrile. . Eyes Nonicteric. Reactive to light. Ears, Nose, Mouth, and Throat Lips, teeth, and gums WNL.Marland Kitchen Moist mucosa without lesions . Neck supple and nontender. No palpable supraclavicular or cervical adenopathy. Normal sized without goiter. Respiratory WNL. No retractions.. Cardiovascular Pedal Pulses WNL. No clubbing, cyanosis or edema. Lymphatic No adneopathy. No adenopathy. No adenopathy. Musculoskeletal Adexa without tenderness or enlargement.. Digits and nails w/o clubbing, cyanosis, infection, petechiae, ischemia, or inflammatory conditions.. Integumentary (Hair, Skin) No suspicious lesions. No crepitus or fluctuance. No peri-wound warmth or erythema. No masses.Marland Kitchen Psychiatric Judgement and insight Intact.. No evidence of depression, anxiety, or agitation.. Notes the wound is a bit dry today and with a forcep I have removed some eschcar and debris and tented up with moist saline gauze. Electronic Signature(s) Signed: 07/09/2015 3:10:39 PM By: Christin Fudge MD, FACS Entered By: Christin Fudge on 07/09/2015 15:10:39 [name], [name] ([id number]) -------------------------------------------------------------------------------- Physician Orders Details Patient Name: [name]. Date of Service: 07/09/2015 2:45 PM Medical Record Number: [id number] Patient Account Number: [id number] Date of  Birth/Sex: August 27, 1960 (54 y.o. Male) Treating RN: Baruch Gouty, RN, BSN, Velva Harman Primary Care Physician: Rutherford Nail Other Clinician: Referring Physician: Rutherford Nail Treating Physician/Extender: Frann Rider in Treatment: 8 Verbal / Phone Orders: Yes Clinician: Afful, RN, BSN, Rita Read Back and Verified: Yes Diagnosis Coding Wound Cleansing Wound #1 Right,Medial Lower Leg o Cleanse wound with mild soap and water o May Shower, gently pat wound dry prior to applying new dressing. Skin Barriers/Peri-Wound Care Wound #1 Right,Medial Lower Leg o Moisturizing lotion Primary Wound Dressing Wound #1 Right,Medial Lower Leg o Other: - sorbact with hydrogel Secondary  Dressing Wound #1 Right,Medial Lower Leg o Boardered Foam Dressing Dressing Change Frequency Wound #1 Right,Medial Lower Leg o Change dressing every other day. Follow-up Appointments Wound #1 Right,Medial Lower Leg o Return Appointment in 1 week. Edema Control Wound #1 Right,Medial Lower Leg o Tubigrip Electronic Signature(s) Signed: 07/09/2015 4:24:56 PM By: Christin Fudge MD, FACS Signed: 07/09/2015 5:19:40 PM By: Regan Lemming BSN, RN Entered By: Regan Lemming on 07/09/2015 15:07:13 [name], [name] ([id number]) [name], [name] ([id number]) -------------------------------------------------------------------------------- Problem List Details Patient Name: ELROY, BACHTELL. Date of Service: 07/09/2015 2:45 PM Medical Record Number: [id number] Patient Account Number: [id number] Date of Birth/Sex: 08-15-1960 (54 y.o. 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 Date of Service: 07/09/2015 2:45 PM Medical Record Number: [id number] Patient Account Number: [id number] Date of Birth/Sex: Sep 21, 1960 (54 y.o. Male) Treating RN: Baruch Gouty, RN, BSN, Velva Harman Primary Care Physician: Rutherford Nail Other Clinician: Referring Physician: Rutherford Nail Treating Physician/Extender: Frann Rider in Treatment: 8 Subjective Chief Complaint Information obtained from Patient Patient presents to the wound care center for a consult due non healing wound he had a lacerated wound to his right lateral calf on September 5 of this year. History of Present Illness (HPI) The following HPI elements were documented for the patient's wound: Location: injury to the right lateral calf 03/30/2015 Quality: Patient reports experiencing a sharp pain to affected area(s). Severity: Patient states wound (s) are getting better. Duration: Patient has had the wound for < 5 weeks prior to presenting for treatment Timing: Pain in wound is constant (hurts all the time) Context: The wound occurred when the patient injured himself with a sharp object and he had a lacerated wound Modifying Factors: Consults to this date include: 2 rounds of antibiotics which include doxycycline and ciprofloxacin. Associated Signs and Symptoms: Patient reports having difficulty standing for long periods. 54 year old gentleman who is known to have diabetes mellitus, hyperlipidemia, chronic pain  syndrome, long-term use of opiate , peripheral artery disease and has had stents placed in lower extremities by Dr. Nicoletta Dress in 2014, carotid stenosis, coronary artery disease, status post CABG, CHF, COPD, atrial fibrillation, obesity, and cardiomyopathy. He reports that he had a wound on his right leg for which he was put on Cipro and doxycycline. He quit smoking about 18 months ago and prior to that smoked about a pack of cigarettes a day. x-ray of his leg done on 03/30/2015 showed no acute bony abnormality but he did have soft tissue calcification. Review of his arterial procedure notes revealed that on 05/14/2013 he had an abdominal aortogram with a right lower extremity runoff, angioplasty of the right common femoral artery, angioplasty of the right common iliac artery and angioplasty of the left external iliac artery by Dr. Delana Meyer. 05/19/2015 -- was seen by Dr. Hortencia Pilar on 04/25/2015 and his advice was due to the fact that the patient has increasing symptoms and now having claudication  and rest pain he has recommended angiography and intervention. the patient however has not setup his appointment for the stent and I have urged him to do this as soon as possible. 06/04/2015 -- he went back to see Dr. Delana Meyer and after review he has been scheduled for a angiography and intervention this coming Tuesday. 06/11/2015 -- he had an operative procedure done by Dr. Delana Meyer on 06/09/2015 and I have reviewed the [name], [name] ([id number]) notes in [place]. He had a angiography and angioplasty of the right femoral artery and has to see him back in a month's time. 06/25/2015 -- he went to see Dr. Delana Meyer today and got a repeat Doppler study of his arterial tree. from what he tells me there was marginal improvement of the blood flow on the right side and no other surgical procedure was planned at the present time. He will be seen back in 6 months time. The patient also had a echo off his heart  done and his ejection fraction is now dropped down significantly as per the patient's description. Objective Constitutional Pulse regular. Respirations normal and unlabored. Afebrile. Vitals Time Taken: 2:58 PM, Height: 67 in, Weight: 248 lbs, BMI: 38.8, Temperature: 97.9 F, Pulse: 93 bpm, Respiratory Rate: 18 breaths/min, Blood Pressure: 144/61 mmHg. Eyes Nonicteric. Reactive to light. Ears, Nose, Mouth, and Throat Lips, teeth, and gums WNL.Marland Kitchen Moist mucosa without lesions . Neck supple and nontender. No palpable supraclavicular or cervical adenopathy. Normal sized without goiter. Respiratory WNL. No retractions.. Cardiovascular Pedal Pulses WNL. No clubbing, cyanosis or edema. Lymphatic No adneopathy. No adenopathy. No adenopathy. Musculoskeletal Adexa without tenderness or enlargement.. Digits and nails w/o clubbing, cyanosis, infection, petechiae, ischemia, or inflammatory conditions.Marland Kitchen Psychiatric Judgement and insight Intact.. No evidence of depression, anxiety, or agitation.. General Notes: the wound is a bit dry today and with a forcep I have removed some eschcar and debris and [name], [name]. ([id number]) tented up with moist saline gauze. Integumentary (Hair, Skin) No suspicious lesions. No crepitus or fluctuance. No peri-wound warmth or erythema. No masses.. Wound #1 status is Open. Original cause of wound was Trauma. The wound is located on the Right,Medial Lower Leg. The wound measures 1.5cm length x 0.5cm width x 0.2cm depth; 0.589cm^2 area and 0.118cm^3 volume. The wound is limited to skin breakdown. There is no tunneling or undermining noted. There is a small amount of serous drainage noted. The wound margin is flat and intact. There is small (1-33%) pink granulation within the wound bed. There is a medium (34-66%) amount of necrotic tissue within the wound bed including Adherent Slough. The periwound skin appearance exhibited: Localized Edema, Scarring, Moist. The  periwound skin appearance did not exhibit: Callus, Crepitus, Excoriation, Fluctuance, Friable, Induration, Rash, Dry/Scaly, Maceration, Atrophie Blanche, Cyanosis, Ecchymosis, Hemosiderin Staining, Mottled, Pallor, Rubor, Erythema. Periwound temperature was noted as No Abnormality. The periwound has tenderness on palpation. 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 I have asked him to change this daily for a few days and see me back next week Procedures Wound #1 Wound #1 is a Diabetic Wound/Ulcer of the Lower Extremity located on the Right,Medial Lower Leg . There was a Skin/Dermis Open Wound/Selective [phone]) debridement with  total area of 0.75 sq cm performed by Christin Fudge, MD. with the following instrument(s): Forceps to remove Non-Viable tissue/material including Exudate, Fibrin/Slough, and Eschar after achieving pain control using Lidocaine 4% Topical Solution. A time out was conducted prior to the start of the procedure. A Minimum amount of bleeding was controlled with Pressure. The procedure was tolerated well with a pain level of 0 throughout and a pain level of 0 following the procedure. Post Debridement Measurements: 1.5cm length x 0.2cm width x 0.1cm depth; 0.024cm^3 volume. Post procedure Diagnosis Wound #1: Same as Pre-Procedure [name], [name] ([id number]) Plan Wound Cleansing: Wound #1 Right,Medial Lower Leg: Cleanse wound with mild soap and water May Shower, gently pat wound dry prior to applying new dressing. Skin Barriers/Peri-Wound Care: Wound #1 Right,Medial Lower Leg: Moisturizing lotion Primary Wound  Dressing: Wound #1 Right,Medial Lower Leg: Other: - sorbact with hydrogel Secondary Dressing: Wound #1 Right,Medial Lower Leg: Boardered Foam Dressing Dressing Change Frequency: Wound #1 Right,Medial Lower Leg: Change dressing every other day. Follow-up Appointments: Wound #1 Right,Medial Lower Leg: Return Appointment in 1 week. Edema Control: Wound #1 Right,Medial Lower Leg: Tubigrip I would like to change over to Sorbact with hydrogel and a bordered from and a light compression over this. I have asked him to change this daily for a few days and see me back next week Electronic Signature(s) Signed: 07/09/2015 3:11:47 PM By: Christin Fudge MD, FACS Entered By: Christin Fudge on 07/09/2015 15:11:46 [name], [name] ([id number]) -------------------------------------------------------------------------------- SuperBill Details Patient Name: [name]. Date of Service: 07/09/2015 Medical Record Number: [id number] Patient Account Number: [id number] Date of Birth/Sex: 1961-04-17 (54 y.o. 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 I70.532 of calf Facility Procedures CPT4: Description Modifier Quantity Code [id number] - DEBRIDE WOUND 1ST 20 SQ CM OR < 1 ICD-10 Description Diagnosis E11.622 Type 2 diabetes mellitus with other skin ulcer S81.811A Laceration without foreign body, right lower leg, initial encounter  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

## 2015-07-10 NOTE — Progress Notes (Signed)
[name], [name] ([id number]) Visit Report for 07/09/2015 Arrival Information Details Patient Name: [name], [name]. Date of Service: 07/09/2015 2:45 PM Medical Record Number: [id number] Patient Account Number: [id number] Date of Birth/Sex: Nov 20, 1960 (54 y.o. Male) Treating RN: Baruch Gouty, RN, BSN, Velva Harman Primary Care Physician: Rutherford Nail Other Clinician: Referring Physician: Rutherford Nail Treating Physician/Extender: Frann Rider in Treatment: 8 Visit Information History Since Last Visit Added or deleted any medications: No Patient Arrived: Ambulatory Any new allergies or adverse reactions: No Arrival Time: 14:54 Had a fall or experienced change in No Accompanied By: self activities of daily living that may affect Transfer Assistance: None risk of falls: Patient Requires Transmission- No Signs or symptoms of abuse/neglect since last No Based Precautions: visito Patient Has Alerts: Yes Hospitalized since last visit: No Patient Alerts: Patient on Blood Has Dressing in Place as Prescribed: Yes Thinner Pain Present Now: No Type II Diabetic Electronic Signature(s) Signed: 07/09/2015 5:19:40 PM By: Regan Lemming BSN, RN Entered By: Regan Lemming on 07/09/2015 14:54:25 Circle, Orson Ape ([id number]) -------------------------------------------------------------------------------- Encounter Discharge Information Details Patient Name: [name]. Date of Service: 07/09/2015 2:45 PM Medical Record Number: [id number] Patient Account Number: [id number] Date of Birth/Sex: Apr 22, 1961 (54 y.o. Male) Treating RN: Baruch Gouty, RN, BSN, Velva Harman Primary Care Physician: Rutherford Nail Other Clinician: Referring Physician: Rutherford Nail Treating Physician/Extender: Frann Rider in Treatment: 8 Encounter Discharge Information Items Discharge Pain Level: 0 Discharge Condition: Stable Ambulatory Status: Ambulatory Discharge Destination: Home Transportation: Private  Auto Accompanied By: self Schedule Follow-up Appointment: No Medication Reconciliation completed and provided to Patient/Care No Arletha Marschke: Provided on Clinical Summary of Care: 07/09/2015 Form Type Recipient Paper Patient [employer] Electronic Signature(s) Signed: 07/09/2015 3:13:12 PM By: Ruthine Dose Entered By: Ruthine Dose on 07/09/2015 15:13:12 Levey, Orson Ape ([id number]) -------------------------------------------------------------------------------- Lower Extremity Assessment Details Patient Name: [name]. Date of Service: 07/09/2015 2:45 PM Medical Record Number: [id number] Patient Account Number: [id number] Date of Birth/Sex: 1961/06/27 (54 y.o. Male) Treating RN: Baruch Gouty, RN, BSN, Velva Harman Primary Care Physician: Rutherford Nail Other Clinician: Referring Physician: Rutherford Nail Treating Physician/Extender: Frann Rider in Treatment: 8 Vascular Assessment Pulses: Posterior Tibial Dorsalis Pedis Palpable: [Right:Yes] Extremity colors, hair growth, and conditions: Extremity Color: [Right:Normal] Hair Growth on Extremity: [Right:Yes] Temperature of Extremity: [Right:Warm] Capillary Refill: [Right:< 3 seconds] Toe Nail Assessment Left: Right: Thick: No Discolored: No Deformed: No Improper Length and Hygiene: No Electronic Signature(s) Signed: 07/09/2015 5:19:40 PM By: Regan Lemming BSN, RN Entered By: Regan Lemming on 07/09/2015 14:56:56 Creek, Orson Ape ([id number]) -------------------------------------------------------------------------------- Multi Wound Chart Details Patient Name: [name]. Date of Service: 07/09/2015 2:45 PM Medical Record Number: [id number] Patient Account Number: [id number] Date of Birth/Sex: April 09, 1961 (54 y.o. Male) Treating RN: Baruch Gouty, RN, BSN, Velva Harman Primary Care Physician: Rutherford Nail Other Clinician: Referring Physician: Rutherford Nail Treating Physician/Extender: Frann Rider in Treatment: 8 Vital  Signs Height(in): 67 Pulse(bpm): 93 Weight(lbs): 248 Blood Pressure 144/61 (mmHg): Body Mass Index(BMI): 39 Temperature(F): 97.9 Respiratory Rate 18 (breaths/min): Photos: [1:No Photos] [N/A:N/A] Wound Location: [1:Right Lower Leg - Medial] [N/A:N/A] Wounding Event: [1:Trauma] [N/A:N/A] Primary Etiology: [1:Diabetic Wound/Ulcer of the Lower Extremity] [N/A:N/A] Comorbid History: [1:Chronic Obstructive Pulmonary Disease (COPD), Congestive Heart Failure, Coronary Artery Disease, Myocardial Infarction, Peripheral Arterial Disease, Peripheral Venous Disease, Type II Diabetes, Neuropathy] [N/A:N/A] Date Acquired: [1:03/30/2015] [N/A:N/A] Weeks of Treatment: [1:8] [N/A:N/A] Wound Status: [1:Open] [N/A:N/A] Measurements L x W x D 1.5x0.5x0.2 [N/A:N/A] (cm) Area (cm) : [1:0.589] [N/A:N/A] Volume (cm) : [1:0.118] [N/A:N/A] % Reduction in Area: [1:92.20%] [N/A:N/A] % Reduction in  Volume: 96.10% [N/A:N/A] Classification: [1:Grade 2] [N/A:N/A] Exudate Amount: [1:Small] [N/A:N/A] Exudate Type: [1:Serous] [N/A:N/A] Exudate Color: [1:amber] [N/A:N/A] Wound Margin: [1:Flat and Intact] [N/A:N/A] Granulation Amount: [1:Small (1-33%)] [N/A:N/A] Granulation Quality: Pink N/A N/A Necrotic Amount: Medium (34-66%) N/A N/A Exposed Structures: Fascia: No N/A N/A Fat: No Tendon: No Muscle: No Joint: No Bone: No Limited to Skin Breakdown Epithelialization: Medium (34-66%) N/A N/A Debridement: Debridement [id number]- N/A N/A [id number]) Time-Out Taken: Yes N/A N/A Pain Control: Lidocaine 4% Topical N/A N/A Solution Tissue Debrided: Fibrin/Slough, N/A N/A Subcutaneous Level: Skin/Subcutaneous N/A N/A Tissue Debridement Area (sq 0.75 N/A N/A cm): Instrument: Forceps N/A N/A Bleeding: Minimum N/A N/A Hemostasis Achieved: Pressure N/A N/A Procedural Pain: 0 N/A N/A Post Procedural Pain: 0 N/A N/A Debridement Treatment Procedure was tolerated N/A N/A Response: well Post Debridement 1.5x0.2x0.1  N/A N/A Measurements L x W x D (cm) Post Debridement 0.024 N/A N/A Volume: (cm) Periwound Skin Texture: Edema: Yes N/A N/A Scarring: Yes Excoriation: No Induration: No Callus: No Crepitus: No Fluctuance: No Friable: No Rash: No Periwound Skin Moist: Yes N/A N/A Moisture: Maceration: No Dry/Scaly: No Periwound Skin Color: Atrophie Blanche: No N/A N/A Cyanosis: No Ecchymosis: No Erythema: No Ziff, SONNI BISCOE. ([id number]) Hemosiderin Staining: No Mottled: No Pallor: No Rubor: No Temperature: No Abnormality N/A N/A Tenderness on Yes N/A N/A Palpation: Wound Preparation: Ulcer Cleansing: N/A N/A Rinsed/Irrigated with Saline Topical Anesthetic Applied: Other: lidocaine 4% Procedures Performed: Debridement N/A N/A Treatment Notes Electronic Signature(s) Signed: 07/09/2015 5:19:40 PM By: Regan Lemming BSN, RN Entered By: Regan Lemming on 07/09/2015 15:06:16 Thorner, Orson Ape ([id number]) -------------------------------------------------------------------------------- [place] Details Patient Name: [name]. Date of Service: 07/09/2015 2:45 PM Medical Record Number: [id number] Patient Account Number: [id number] Date of Birth/Sex: 1961-07-03 (54 y.o. Male) Treating RN: Baruch Gouty, RN, BSN, Velva Harman Primary Care Physician: Rutherford Nail Other Clinician: Referring Physician: Rutherford Nail Treating Physician/Extender: Frann Rider in Treatment: 8 Active Inactive Abuse / Safety / Falls / Self Care Management Nursing Diagnoses: Potential for falls Self care deficit: actual or potential Goals: Patient will remain injury free Date Initiated: 05/12/2015 Goal Status: Active Interventions: Assess fall risk on admission and as needed Notes: Orientation to the Wound Care Program Nursing Diagnoses: Knowledge deficit related to the wound healing center program Goals: Patient/caregiver will verbalize understanding of the [facility]  Program Date Initiated: 05/12/2015 Goal Status: Active Interventions: Provide education on orientation to the wound center Notes: Soft Tissue Infection Nursing Diagnoses: Potential for infection: soft tissue Goals: Patient will remain free of wound infection MERARI, MAISH ([id number]) Date Initiated: 05/12/2015 Goal Status: Active Signs and symptoms of infection will be recognized early to allow for prompt treatment Date Initiated: 05/12/2015 Goal Status: Active Interventions: Assess signs and symptoms of infection every visit Treatment Activities: Systemic antibiotics : 07/09/2015 Notes: Wound/Skin Impairment Nursing Diagnoses: Impaired tissue integrity Goals: Ulcer/skin breakdown will heal within 14 weeks Date Initiated: 05/12/2015 Goal Status: Active Interventions: Assess ulceration(s) every visit Treatment Activities: Topical wound management initiated : 07/09/2015 Notes: Electronic Signature(s) Signed: 07/09/2015 5:19:40 PM By: Regan Lemming BSN, RN Entered By: Regan Lemming on 07/09/2015 14:58:51 Yaden, Orson Ape ([id number]) -------------------------------------------------------------------------------- Pain Assessment Details Patient Name: [name]. Date of Service: 07/09/2015 2:45 PM Medical Record Number: [id number] Patient Account Number: [id number] Date of Birth/Sex: 10-31-1960 (54 y.o. Male) Treating RN: Baruch Gouty, RN, BSN, Velva Harman Primary Care Physician: Rutherford Nail Other Clinician: Referring Physician: Rutherford Nail Treating Physician/Extender: Frann Rider in Treatment: 8 Active Problems Location of Pain Severity and  Description of Pain Patient Has Paino No Site Locations Pain Management and Medication Current Pain Management: Electronic Signature(s) Signed: 07/09/2015 5:19:40 PM By: Regan Lemming BSN, RN Entered By: Regan Lemming on 07/09/2015 14:56:36 Herber, Orson Ape  ([id number]) -------------------------------------------------------------------------------- Patient/Caregiver Education Details Patient Name: [name]. Date of Service: 07/09/2015 2:45 PM Medical Record Number: [id number] Patient Account Number: [id number] Date of Birth/Gender: 05/24/1961 (54 y.o. Male) Treating RN: Baruch Gouty, RN, BSN, Velva Harman Primary Care Physician: Rutherford Nail Other Clinician: Referring Physician: Rutherford Nail Treating Physician/Extender: Frann Rider in Treatment: 8 Education Assessment Education Provided To: Patient Education Topics Provided Welcome To [facility]: Methods: Explain/Verbal Responses: State content correctly Electronic Signature(s) Signed: 07/09/2015 5:19:40 PM By: Regan Lemming BSN, RN Entered By: Regan Lemming on 07/09/2015 15:08:11 [name] ([id number]) -------------------------------------------------------------------------------- Wound Assessment Details Patient Name: [name]. Date of Service: 07/09/2015 2:45 PM Medical Record Number: [id number] Patient Account Number: [id number] Date of Birth/Sex: 1960-11-11 (54 y.o. Male) Treating RN: Baruch Gouty, RN, BSN, [place] Primary Care Physician: Rutherford Nail Other Clinician: Referring Physician: Rutherford Nail Treating Physician/Extender: Frann Rider in Treatment: 8 Wound Status Wound Number: 1 Primary Diabetic Wound/Ulcer of the Lower Etiology: Extremity Wound Location: Right Lower Leg - Medial Wound Open Wounding Event: Trauma Status: Date Acquired: 03/30/2015 Comorbid Chronic Obstructive Pulmonary Disease Weeks Of Treatment: 8 History: (COPD), Congestive Heart Failure, Clustered Wound: No Coronary Artery Disease, Myocardial Infarction, Peripheral Arterial Disease, Peripheral Venous Disease, Type II Diabetes, Neuropathy Photos Photo Uploaded By: Regan Lemming on 07/09/2015 16:43:06 Wound Measurements Length: (cm) 1.5 Width: (cm)  0.5 Depth: (cm) 0.2 Area: (cm) 0.589 Volume: (cm) 0.118 % Reduction in Area: 92.2% % Reduction in Volume: 96.1% Epithelialization: Medium (34-66%) Tunneling: No Undermining: No Wound Description Classification: Grade 2 Foul Odor Aft Wound Margin: Flat and Intact Exudate Amount: Small Exudate Type: Serous Exudate Color: amber er Cleansing: No Wound Bed Granulation Amount: Small (1-33%) Exposed Structure Lacko, Orson Ape ([id number]) Granulation Quality: Pink Fascia Exposed: No Necrotic Amount: Medium (34-66%) Fat Layer Exposed: No Necrotic Quality: Adherent Slough Tendon Exposed: No Muscle Exposed: No Joint Exposed: No Bone Exposed: No Limited to Skin Breakdown Periwound Skin Texture Texture Color No Abnormalities Noted: No No Abnormalities Noted: No Callus: No Atrophie Blanche: No Crepitus: No Cyanosis: No Excoriation: No Ecchymosis: No Fluctuance: No Erythema: No Friable: No Hemosiderin Staining: No Induration: No Mottled: No Localized Edema: Yes Pallor: No Rash: No Rubor: No Scarring: Yes Temperature / Pain Moisture Temperature: No Abnormality No Abnormalities Noted: No Tenderness on Palpation: Yes Dry / Scaly: No Maceration: No Moist: Yes Wound Preparation Ulcer Cleansing: Rinsed/Irrigated with Saline Topical Anesthetic Applied: Other: lidocaine 4%, Treatment Notes Wound #1 (Right, Medial Lower Leg) 1. Cleansed with: Clean wound with Normal Saline 4. Dressing Applied: Other dressing (specify in notes) 5. Secondary Dressing Applied Bordered Foam Dressing 7. Secured with Tubigrip Notes sorbact with hydrogel Electronic Signature(s) Signed: 07/09/2015 5:19:40 PM By: Regan Lemming BSN, RN Entered By: Regan Lemming on 07/09/2015 14:57:29 Ramiro, Orson Ape ([id number]) [address], Orson Ape ([id number]) -------------------------------------------------------------------------------- Vitals Details Patient Name: [name] Date of Service:  07/09/2015 2:45 PM Medical Record Number: [id number] Patient Account Number: [id number] Date of Birth/Sex: September 24, 1960 (54 y.o. Male) Treating RN: Baruch Gouty, RN, BSN, Velva Harman Primary Care Physician: Rutherford Nail Other Clinician: Referring Physician: Rutherford Nail Treating Physician/Extender: Frann Rider in Treatment: 8 Vital Signs Time Taken: 14:58 Temperature (F): 97.9 Height (in): 67 Pulse (bpm): 93 Weight (lbs): 248 Respiratory Rate (breaths/min): 18 Body Mass Index (BMI): 38.8  Blood Pressure (mmHg): 144/61 Reference Range: 80 - 120 mg / dl Electronic Signature(s) Signed: 07/09/2015 5:19:40 PM By: Regan Lemming BSN, RN Entered By: Regan Lemming on 07/09/2015 14:58:03

## 2015-07-16 ENCOUNTER — Encounter: Payer: No Typology Code available for payment source | Admitting: Surgery

## 2015-07-16 DIAGNOSIS — E11622 Type 2 diabetes mellitus with other skin ulcer: Secondary | ICD-10-CM | POA: Diagnosis not present

## 2015-07-17 NOTE — Progress Notes (Signed)
CAINE, SIHARATH ([id number]) Visit Report for 07/16/2015 Chief Complaint Document Details Patient Name: [name], [name]. Date of Service: 07/16/2015 3:00 PM Medical Record Number: [id number] Patient Account Number: [id number] Date of Birth/Sex: Mar 24, 1961 (54 y.o. Male) Treating RN: Baruch Gouty, RN, BSN, Velva Harman Primary Care Physician: Rutherford Nail Other Clinician: Referring Physician: Rutherford Nail Treating Physician/Extender: Frann Rider in Treatment: 9 Information Obtained from: Patient Chief Complaint Patient presents to the wound care center for a consult due non healing wound he had a lacerated wound to his right lateral calf on September 5 of this year. Electronic Signature(s) Signed: 07/16/2015 3:38:28 PM By: Christin Fudge MD, FACS Entered By: Christin Fudge on 07/16/2015 15:38:28 Badertscher, [name] ([id number]) -------------------------------------------------------------------------------- HPI Details Patient Name: [name] Date of Service: 07/16/2015 3:00 PM Medical Record Number: [id number] Patient Account Number: [id number] Date of Birth/Sex: 1961-05-14 (54 y.o. Male) Treating RN: Baruch Gouty, RN, BSN, Velva Harman Primary Care Physician: Rutherford Nail Other Clinician: Referring Physician: Rutherford Nail Treating Physician/Extender: Frann Rider in Treatment: 9 History of Present Illness Location: injury to the right lateral calf 03/30/2015 Quality: Patient reports experiencing a sharp pain to affected area(s). Severity: Patient states wound (s) are getting better. Duration: Patient has had the wound for < 5 weeks prior to presenting for treatment Timing: Pain in wound is constant (hurts all the time) Context: The wound occurred when the patient injured himself with a sharp object and he had a lacerated wound Modifying Factors: Consults to this date include: 2 rounds of antibiotics which include doxycycline and ciprofloxacin. Associated Signs and  Symptoms: Patient reports having difficulty standing for long periods. HPI Description: 54 year old gentleman who is known to have diabetes mellitus, hyperlipidemia, chronic pain syndrome, long-term use of opiate , peripheral artery disease and has had stents placed in lower extremities by Dr. Nicoletta Dress in 2014, carotid stenosis, coronary artery disease, status post CABG, CHF, COPD, atrial fibrillation, obesity, and cardiomyopathy. He reports that he had a wound on his right leg for which he was put on Cipro and doxycycline. He quit smoking about 18 months ago and prior to that smoked about a pack of cigarettes a day. x-ray of his leg done on 03/30/2015 showed no acute bony abnormality but he did have soft tissue calcification. Review of his arterial procedure notes revealed that on 05/14/2013 he had an abdominal aortogram with a right lower extremity runoff, angioplasty of the right common femoral artery, angioplasty of the right common iliac artery and angioplasty of the left external iliac artery by Dr. Delana Meyer. 05/19/2015 -- was seen by Dr. Hortencia Pilar on 04/25/2015 and his advice was due to the fact that the patient has increasing symptoms and now having claudication and rest pain he has recommended angiography and intervention. the patient however has not setup his appointment for the stent and I have urged him to do this as soon as possible. 06/04/2015 -- he went back to see Dr. Delana Meyer and after review he has been scheduled for a angiography and intervention this coming Tuesday. 06/11/2015 -- he had an operative procedure done by Dr. Delana Meyer on 06/09/2015 and I have reviewed the notes in [facility]. He had a angiography and angioplasty of the right femoral artery and has to see him back in a month's time. 06/25/2015 -- he went to see Dr. Delana Meyer today and got a repeat Doppler study of his arterial tree. from what he tells me there was marginal improvement of the blood flow on the right side  and no other surgical  procedure was planned at the present time. He will be seen back in 6 months time. The patient also had a echo off his heart done and his ejection fraction is now dropped down significantly as per the patient's description. [name], [name] ([id number]) Electronic Signature(s) Signed: 07/16/2015 3:38:40 PM By: Christin Fudge MD, FACS Entered By: Christin Fudge on 07/16/2015 15:38:39 [name], [name] ([id number]) -------------------------------------------------------------------------------- Physical Exam Details Patient Name: [name] Date of Service: 07/16/2015 3:00 PM Medical Record Number: [id number] Patient Account Number: [id number] Date of Birth/Sex: 01/26/1961 (54 y.o. Male) Treating RN: Baruch Gouty, RN, BSN, Velva Harman Primary Care Physician: Rutherford Nail Other Clinician: Referring Physician: Rutherford Nail Treating Physician/Extender: Frann Rider in Treatment: 9 Constitutional . Pulse regular. Respirations normal and unlabored. Afebrile. . Eyes Nonicteric. Reactive to light. Ears, Nose, Mouth, and Throat Lips, teeth, and gums WNL.Marland Kitchen Moist mucosa without lesions. Neck supple and nontender. No palpable supraclavicular or cervical adenopathy. Normal sized without goiter. Respiratory WNL. No retractions.. Cardiovascular Pedal Pulses WNL. No clubbing, cyanosis or edema. Lymphatic No adneopathy. No adenopathy. No adenopathy. Musculoskeletal Adexa without tenderness or enlargement.. Digits and nails w/o clubbing, cyanosis, infection, petechiae, ischemia, or inflammatory conditions.. Integumentary (Hair, Skin) No suspicious lesions. No crepitus or fluctuance. No peri-wound warmth or erythema. No masses.Marland Kitchen Psychiatric Judgement and insight Intact.. No evidence of depression, anxiety, or agitation.. Notes the wound overall is looking much better and there is no debris and it is good healthy granulation tissue. No debridement was required  today. Electronic Signature(s) Signed: 07/16/2015 3:40:07 PM By: Christin Fudge MD, FACS Entered By: Christin Fudge on 07/16/2015 15:40:07 [name], [name] ([id number]) -------------------------------------------------------------------------------- Physician Orders Details Patient Name: [name]. Date of Service: 07/16/2015 3:00 PM Medical Record Number: [id number] Patient Account Number: [id number] Date of Birth/Sex: Jan 18, 1961 (54 y.o. Male) Treating RN: Baruch Gouty, RN, BSN, Velva Harman Primary Care Physician: Rutherford Nail Other Clinician: Referring Physician: Rutherford Nail Treating Physician/Extender: Frann Rider in Treatment: 9 Verbal / Phone Orders: Yes Clinician: Afful, RN, BSN, Rita Read Back and Verified: Yes Diagnosis Coding Wound Cleansing Wound #1 Right,Medial Lower Leg o Cleanse wound with mild soap and water o May Shower, gently pat wound dry prior to applying new dressing. Skin Barriers/Peri-Wound Care Wound #1 Right,Medial Lower Leg o Moisturizing lotion Primary Wound Dressing Wound #1 Right,Medial Lower Leg o Other: - sorbact with hydrogel Secondary Dressing Wound #1 Right,Medial Lower Leg o Boardered Foam Dressing Dressing Change Frequency Wound #1 Right,Medial Lower Leg o Change dressing every other day. Follow-up Appointments Wound #1 Right,Medial Lower Leg o Return Appointment in 2 weeks. Edema Control Wound #1 Right,Medial Lower Leg o Tubigrip Electronic Signature(s) Signed: 07/16/2015 4:29:20 PM By: Regan Lemming BSN, RN Signed: 07/16/2015 5:05:23 PM By: Christin Fudge MD, FACS Entered By: Regan Lemming on 07/16/2015 15:35:58 [name], [name] ([id number]) [name], [name] ([id number]) -------------------------------------------------------------------------------- Problem List Details Patient Name: RAKIM, ZHAI. Date of Service: 07/16/2015 3:00 PM Medical Record Number: [id number] Patient Account Number: [id number] Date  of Birth/Sex: October 07, 1960 (54 y.o. 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 Date of Service: 07/16/2015 3:00 PM Medical Record Number: [id number] Patient Account Number: [id number] Date of Birth/Sex: [birth year] (54 y.o. Male) Treating RN: Baruch Gouty, RN, BSN, Velva Harman Primary Care Physician: Rutherford Nail Other Clinician: Referring Physician: Rutherford Nail Treating Physician/Extender: Frann Rider in Treatment: 9 Subjective Chief Complaint Information obtained from Patient Patient presents to the wound care center for a consult due non healing wound he had a lacerated wound to his right lateral calf on September 5 of this year. History of Present Illness (HPI) The following HPI elements were documented for the patient's wound: Location: injury to the right lateral calf 03/30/2015 Quality: Patient reports experiencing a sharp pain to affected area(s). Severity: Patient states wound (s) are getting better. Duration: Patient has had the wound for < 5 weeks prior to presenting for treatment Timing: Pain in wound is constant (hurts all the  time) Context: The wound occurred when the patient injured himself with a sharp object and he had a lacerated wound Modifying Factors: Consults to this date include: 2 rounds of antibiotics which include doxycycline and ciprofloxacin. Associated Signs and Symptoms: Patient reports having difficulty standing for long periods. 54 year old gentleman who is known to have diabetes mellitus, hyperlipidemia, chronic pain syndrome, long-term use of opiate , peripheral artery disease and has had stents placed in lower extremities by Dr. Nicoletta Dress in 2014, carotid stenosis, coronary artery disease, status post CABG, CHF, COPD, atrial fibrillation, obesity, and cardiomyopathy. He reports that he had a wound on his right leg for which he was put on Cipro and doxycycline. He quit smoking about 18 months ago and prior to that smoked about a pack of cigarettes a day. x-ray of his leg done on 03/30/2015 showed no acute bony abnormality but he did have soft tissue calcification. Review of his arterial procedure notes revealed that on 05/14/2013 he had an abdominal aortogram with a right lower extremity runoff, angioplasty of the right common femoral artery, angioplasty of the right common iliac artery and angioplasty of the left external iliac artery by Dr. Delana Meyer. 05/19/2015 -- was seen by Dr. Hortencia Pilar on 04/25/2015 and his advice was due to the fact that the patient has increasing symptoms and now having claudication and rest pain he has recommended angiography and intervention. the patient however has not setup his appointment for the stent and I have urged him to do this as soon as possible. 06/04/2015 -- he went back to see Dr. Delana Meyer and after review he has been scheduled for a angiography and intervention this coming Tuesday. 06/11/2015 -- he had an operative procedure done by Dr. Delana Meyer on 06/09/2015 and I have reviewed the [name], [name] ([id number]) notes in [facility]. He had a angiography and  angioplasty of the right femoral artery and has to see him back in a month's time. 06/25/2015 -- he went to see Dr. Delana Meyer today and got a repeat Doppler study of his arterial tree. from what he tells me there was marginal improvement of the blood flow on the right side and no other surgical procedure was planned at the present time. He will be seen back in 6 months time. The patient also had a echo off his heart done and his ejection fraction is now dropped down significantly as per the patient's description. Objective Constitutional Pulse regular. Respirations normal and unlabored. Afebrile. Vitals Time Taken: 3:34  PM, Height: 67 in, Weight: 248 lbs, BMI: 38.8, Temperature: 97.9 F, Pulse: 89 bpm, Respiratory Rate: 18 breaths/min, Blood Pressure: 155/100 mmHg. Eyes Nonicteric. Reactive to light. Ears, Nose, Mouth, and Throat Lips, teeth, and gums WNL.Marland Kitchen Moist mucosa without lesions. Neck supple and nontender. No palpable supraclavicular or cervical adenopathy. Normal sized without goiter. Respiratory WNL. No retractions.. Cardiovascular Pedal Pulses WNL. No clubbing, cyanosis or edema. Lymphatic No adneopathy. No adenopathy. No adenopathy. Musculoskeletal Adexa without tenderness or enlargement.. Digits and nails w/o clubbing, cyanosis, infection, petechiae, ischemia, or inflammatory conditions.Marland Kitchen Psychiatric Judgement and insight Intact.. No evidence of depression, anxiety, or agitation.. General Notes: the wound overall is looking much better and there is no debris and it is good healthy [name], [name]. ([id number]) granulation tissue. No debridement was required today. Integumentary (Hair, Skin) No suspicious lesions. No crepitus or fluctuance. No peri-wound warmth or erythema. No masses.. Wound #1 status is Open. Original cause of wound was Trauma. The wound is located on the Right,Medial Lower Leg. The wound measures 1.3cm length x 0.2cm width x 0.2cm depth; 0.204cm^2 area  and 0.041cm^3 volume. The wound is limited to skin breakdown. There is no tunneling or undermining noted. There is a small amount of serous drainage noted. The wound margin is flat and intact. There is medium (34-66%) pink granulation within the wound bed. There is a small (1-33%) amount of necrotic tissue within the wound bed including Adherent Slough. The periwound skin appearance exhibited: Localized Edema, Scarring, Moist. The periwound skin appearance did not exhibit: Callus, Crepitus, Excoriation, Fluctuance, Friable, Induration, Rash, Dry/Scaly, Maceration, Atrophie Blanche, Cyanosis, Ecchymosis, Hemosiderin Staining, Mottled, Pallor, Rubor, Erythema. Periwound temperature was noted as No Abnormality. The periwound has tenderness on palpation. 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 I have asked him to change this daily for a few days and see me back in 2 weeks because of the holidays. Plan Wound Cleansing: Wound #1 Right,Medial Lower Leg: Cleanse wound with mild soap and water May Shower, gently pat wound dry prior to applying new dressing. Skin Barriers/Peri-Wound Care: Wound #1 Right,Medial Lower Leg: Moisturizing lotion Primary Wound Dressing: [name], [name] ([id number]) Wound #1 Right,Medial Lower Leg: Other: - sorbact with hydrogel Secondary Dressing: Wound #1 Right,Medial Lower Leg: Boardered Foam Dressing Dressing Change Frequency: Wound #1 Right,Medial Lower Leg: Change dressing every other day. Follow-up Appointments: Wound #1 Right,Medial Lower Leg: Return Appointment in 2 weeks. Edema Control: Wound #1 Right,Medial  Lower Leg: Tubigrip I would like to change over to Sorbact with hydrogel and a bordered from and a light compression over this. I have asked him to change this daily for a few days and see me back in 2 weeks because of the holidays. Electronic Signature(s) Signed: 07/16/2015 3:40:53 PM By: Christin Fudge MD, FACS Entered By: Christin Fudge on 07/16/2015 15:40:53 [name], [name] ([id number]) -------------------------------------------------------------------------------- SuperBill Details Patient Name: [name] Date of Service: 07/16/2015 Medical Record Number: [id number] Patient Account Number: [id number] Date of Birth/Sex: 01-11-1961 (54 y.o. 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 I70.532 of calf Facility Procedures CPT4 Code: [id number] Description: [phone] - WOUND CARE VISIT-LEV 2 EST PT Modifier: Quantity: 1 Physician Procedures CPT4: Description Modifier Quantity Code [id number] - WC PHYS LEVEL 3 - EST PT 1 ICD-10 Description Diagnosis E11.622 Type 2 diabetes mellitus with other skin ulcer S81.811A Laceration without foreign body, right lower leg, initial encounter I70.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

## 2015-07-17 NOTE — Progress Notes (Addendum)
JC, GINDHART ([id number]) Visit Report for 07/16/2015 Arrival Information Details Patient Name: [name], [name]. Date of Service: 07/16/2015 3:00 PM Medical Record Number: [id number] Patient Account Number: [id number] Date of Birth/Sex: 01/30/1961 (54 y.o. Male) Treating RN: Baruch Gouty, RN, BSN, Velva Harman Primary Care Physician: Rutherford Nail Other Clinician: Referring Physician: Rutherford Nail Treating Physician/Extender: Frann Rider in Treatment: 9 Visit Information History Since Last Visit Any new allergies or adverse reactions: No Patient Arrived: Ambulatory Had a fall or experienced change in No Arrival Time: 15:20 activities of daily living that may affect Accompanied By: SELF risk of falls: Transfer Assistance: None Signs or symptoms of abuse/neglect since last No Patient Identification Verified: Yes visito Secondary Verification Process Yes Hospitalized since last visit: No Completed: Has Dressing in Place as Prescribed: Yes Patient Requires Transmission- No Has Compression in Place as Prescribed: Yes Based Precautions: Pain Present Now: No Patient Has Alerts: Yes Patient Alerts: Patient on Blood Thinner Type II Diabetic Electronic Signature(s) Signed: 07/16/2015 4:29:20 PM By: Regan Lemming BSN, RN Entered By: Regan Lemming on 07/16/2015 15:23:39 Gaumond, Orson Ape ([id number]) -------------------------------------------------------------------------------- Clinic Level of Care Assessment Details Patient Name: [name]. Date of Service: 07/16/2015 3:00 PM Medical Record Number: [id number] Patient Account Number: [id number] Date of Birth/Sex: February 27, 1961 (54 y.o. Male) Treating RN: Baruch Gouty, RN, BSN, Velva Harman Primary Care Physician: Rutherford Nail Other Clinician: Referring Physician: Rutherford Nail Treating Physician/Extender: Frann Rider in Treatment: 9 Clinic Level of Care Assessment Items TOOL 4 Quantity Score []  - Use when only an EandM is  performed on FOLLOW-UP visit 0 ASSESSMENTS - Nursing Assessment / Reassessment X - Reassessment of Co-morbidities (includes updates in patient status) 1 10 X - Reassessment of Adherence to Treatment Plan 1 5 ASSESSMENTS - Wound and Skin Assessment / Reassessment X - Simple Wound Assessment / Reassessment - one wound 1 5 []  - Complex Wound Assessment / Reassessment - multiple wounds 0 []  - Dermatologic / Skin Assessment (not related to wound area) 0 ASSESSMENTS - Focused Assessment []  - Circumferential Edema Measurements - multi extremities 0 []  - Nutritional Assessment / Counseling / Intervention 0 X - Lower Extremity Assessment (monofilament, tuning fork, pulses) 1 5 []  - Peripheral Arterial Disease Assessment (using hand held doppler) 0 ASSESSMENTS - Ostomy and/or Continence Assessment and Care []  - Incontinence Assessment and Management 0 []  - Ostomy Care Assessment and Management (repouching, etc.) 0 PROCESS - Coordination of Care []  - Simple Patient / Family Education for ongoing care 0 []  - Complex (extensive) Patient / Family Education for ongoing care 0 []  - Staff obtains Programmer, systems, Records, Test Results / Process Orders 0 []  - Staff telephones HHA, Nursing Homes / Clarify orders / etc 0 []  - Routine Transfer to another Facility (non-emergent condition) 0 Siever, Orson Ape ([id number]) []  - Routine Hospital Admission (non-emergent condition) 0 []  - New Admissions / Biomedical engineer / Ordering NPWT, Apligraf, etc. 0 []  - Emergency Hospital Admission (emergent condition) 0 []  - Simple Discharge Coordination 0 []  - Complex (extensive) Discharge Coordination 0 PROCESS - Special Needs []  - Pediatric / Minor Patient Management 0 []  - Isolation Patient Management 0 []  - Hearing / Language / Visual special needs 0 []  - Assessment of Community assistance (transportation, D/C planning, etc.) 0 []  - Additional assistance / Altered mentation 0 []  - Support Surface(s) Assessment  (bed, cushion, seat, etc.) 0 INTERVENTIONS - Wound Cleansing / Measurement X - Simple Wound Cleansing - one wound 1 5 []  - Complex Wound Cleansing - multiple wounds  0 X - Wound Imaging (photographs - any number of wounds) 1 5 []  - Wound Tracing (instead of photographs) 0 X - Simple Wound Measurement - one wound 1 5 []  - Complex Wound Measurement - multiple wounds 0 INTERVENTIONS - Wound Dressings X - Small Wound Dressing one or multiple wounds 1 10 []  - Medium Wound Dressing one or multiple wounds 0 []  - Large Wound Dressing one or multiple wounds 0 []  - Application of Medications - topical 0 []  - Application of Medications - injection 0 INTERVENTIONS - Miscellaneous []  - External ear exam 0 Morini, Orson Ape ([id number]) []  - Specimen Collection (cultures, biopsies, blood, body fluids, etc.) 0 []  - Specimen(s) / Culture(s) sent or taken to Lab for analysis 0 []  - Patient Transfer (multiple staff / Harrel Lemon Lift / Similar devices) 0 []  - Simple Staple / Suture removal (25 or less) 0 []  - Complex Staple / Suture removal (26 or more) 0 []  - Hypo / Hyperglycemic Management (close monitor of Blood Glucose) 0 []  - Ankle / Brachial Index (ABI) - do not check if billed separately 0 X - Vital Signs 1 5 Has the patient been seen at the hospital within the last three years: Yes Total Score: 55 Level Of Care: New/Established - Level 2 Electronic Signature(s) Signed: 07/16/2015 4:29:20 PM By: Regan Lemming BSN, RN Entered By: Regan Lemming on 07/16/2015 15:42:36 Musa, Orson Ape ([id number]) -------------------------------------------------------------------------------- Encounter Discharge Information Details Patient Name: [name]. Date of Service: 07/16/2015 3:00 PM Medical Record Number: [id number] Patient Account Number: [id number] Date of Birth/Sex: Jun 19, 1961 (54 y.o. Male) Treating RN: Baruch Gouty, RN, BSN, Velva Harman Primary Care Physician: Rutherford Nail Other Clinician: Referring  Physician: Rutherford Nail Treating Physician/Extender: Frann Rider in Treatment: 9 Encounter Discharge Information Items Discharge Pain Level: 0 Discharge Condition: Stable Ambulatory Status: Ambulatory Discharge Destination: Home Transportation: Private Auto Accompanied By: self Schedule Follow-up Appointment: No Medication Reconciliation completed and provided to Patient/Care No Dannisha Eckmann: Provided on Clinical Summary of Care: 07/16/2015 Form Type Recipient Paper Patient [employer] Electronic Signature(s) Signed: 07/16/2015 4:29:20 PM By: Regan Lemming BSN, RN Previous Signature: 07/16/2015 3:42:35 PM Version By: Ruthine Dose Entered By: Regan Lemming on 07/16/2015 15:43:46 Imler, Orson Ape ([id number]) -------------------------------------------------------------------------------- Lower Extremity Assessment Details Patient Name: [name]. Date of Service: 07/16/2015 3:00 PM Medical Record Number: [id number] Patient Account Number: [id number] Date of Birth/Sex: [birth year] (54 y.o. Male) Treating RN: Baruch Gouty, RN, BSN, Velva Harman Primary Care Physician: Rutherford Nail Other Clinician: Referring Physician: Rutherford Nail Treating Physician/Extender: Frann Rider in Treatment: 9 Vascular Assessment Pulses: Posterior Tibial Dorsalis Pedis Palpable: [Right:Yes] Extremity colors, hair growth, and conditions: Extremity Color: [Right:Normal] Hair Growth on Extremity: [Right:Yes] Temperature of Extremity: [Right:Warm] Capillary Refill: [Right:< 3 seconds] Toe Nail Assessment Left: Right: Thick: No Discolored: No Deformed: No Improper Length and Hygiene: No Electronic Signature(s) Signed: 07/16/2015 4:29:20 PM By: Regan Lemming BSN, RN Entered By: Regan Lemming on 07/16/2015 15:25:17 Castagna, Orson Ape ([id number]) -------------------------------------------------------------------------------- Multi Wound Chart Details Patient Name: [name]. Date of  Service: 07/16/2015 3:00 PM Medical Record Number: [id number] Patient Account Number: [id number] Date of Birth/Sex: [birth year] (53 y.o. Male) Treating RN: Baruch Gouty, RN, BSN, Velva Harman Primary Care Physician: Rutherford Nail Other Clinician: Referring Physician: Rutherford Nail Treating Physician/Extender: Frann Rider in Treatment: 9 Vital Signs Height(in): 67 Pulse(bpm): 89 Weight(lbs): 248 Blood Pressure 155/100 (mmHg): Body Mass Index(BMI): 39 Temperature(F): 97.9 Respiratory Rate 18 (breaths/min): Photos: [1:No Photos] [N/A:N/A] Wound Location: [1:Right Lower Leg - Medial] [N/A:N/A] Wounding Event: [1:Trauma] [N/A:N/A]  Primary Etiology: [1:Diabetic Wound/Ulcer of the Lower Extremity] [N/A:N/A] Comorbid History: [1:Chronic Obstructive Pulmonary Disease (COPD), Congestive Heart Failure, Coronary Artery Disease, Myocardial Infarction, Peripheral Arterial Disease, Peripheral Venous Disease, Type II Diabetes, Neuropathy] [N/A:N/A] Date Acquired: [1:03/30/2015] [N/A:N/A] Weeks of Treatment: [1:9] [N/A:N/A] Wound Status: [1:Open] [N/A:N/A] Measurements L x W x D 1.3x0.2x0.2 [N/A:N/A] (cm) Area (cm) : [1:0.204] [N/A:N/A] Volume (cm) : [1:0.041] [N/A:N/A] % Reduction in Area: [1:97.30%] [N/A:N/A] % Reduction in Volume: 98.60% [N/A:N/A] Classification: [1:Grade 2] [N/A:N/A] Exudate Amount: [1:Small] [N/A:N/A] Exudate Type: [1:Serous] [N/A:N/A] Exudate Color: [1:amber] [N/A:N/A] Wound Margin: [1:Flat and Intact] [N/A:N/A] Granulation Amount: [1:Medium (34-66%)] [N/A:N/A] Granulation Quality: Pink N/A N/A Necrotic Amount: Small (1-33%) N/A N/A Exposed Structures: Fascia: No N/A N/A Fat: No Tendon: No Muscle: No Joint: No Bone: No Limited to Skin Breakdown Epithelialization: Medium (34-66%) N/A N/A Periwound Skin Texture: Edema: Yes N/A N/A Scarring: Yes Excoriation: No Induration: No Callus: No Crepitus: No Fluctuance: No Friable: No Rash: No Periwound Skin  Moist: Yes N/A N/A Moisture: Maceration: No Dry/Scaly: No Periwound Skin Color: Atrophie Blanche: No N/A N/A Cyanosis: No Ecchymosis: No Erythema: No Hemosiderin Staining: No Mottled: No Pallor: No Rubor: No Temperature: No Abnormality N/A N/A Tenderness on Yes N/A N/A Palpation: Wound Preparation: Ulcer Cleansing: N/A N/A Rinsed/Irrigated with Saline Topical Anesthetic Applied: Other: lidocaine 4% Treatment Notes Electronic Signature(s) Signed: 07/16/2015 4:29:20 PM By: Regan Lemming BSN, RN Entered By: Regan Lemming on 07/16/2015 15:34:31 Toelle, Orson Ape ([id number]) -------------------------------------------------------------------------------- [place] Details Patient Name: [name]. Date of Service: 07/16/2015 3:00 PM Medical Record Number: [id number] Patient Account Number: [id number] Date of Birth/Sex: 12/04/1960 (54 y.o. Male) Treating RN: Baruch Gouty, RN, BSN, Velva Harman Primary Care Physician: Rutherford Nail Other Clinician: Referring Physician: Rutherford Nail Treating Physician/Extender: Frann Rider in Treatment: 9 Active Inactive Electronic Signature(s) Signed: 08/27/2015 5:14:45 PM By: Regan Lemming BSN, RN Previous Signature: 07/16/2015 4:29:20 PM Version By: Regan Lemming BSN, RN Entered By: Regan Lemming on 08/27/2015 17:14:45 Montroy, Orson Ape ([id number]) -------------------------------------------------------------------------------- Patient/Caregiver Education Details Patient Name: [name] Date of Service: 07/16/2015 3:00 PM Medical Record Number: [id number] Patient Account Number: [id number] Date of Birth/Gender: 11-11-1960 (54 y.o. Male) Treating RN: Baruch Gouty, RN, BSN, Velva Harman Primary Care Physician: Rutherford Nail Other Clinician: Referring Physician: Rutherford Nail Treating Physician/Extender: Frann Rider in Treatment: 9 Education Assessment Education Provided To: Patient Education Topics  Provided Welcome To [facility] [place]: Methods: Explain/Verbal Responses: State content correctly Electronic Signature(s) Signed: 07/16/2015 4:29:20 PM By: Regan Lemming BSN, RN Entered By: Regan Lemming on 07/16/2015 15:43:58 Padin, Orson Ape ([id number]) -------------------------------------------------------------------------------- Wound Assessment Details Patient Name: [name]. Date of Service: 07/16/2015 3:00 PM Medical Record Number: [id number] Patient Account Number: [id number] Date of Birth/Sex: 07-Jun-1961 (54 y.o. Male) Treating RN: Baruch Gouty, RN, BSN, [place] Primary Care Physician: Rutherford Nail Other Clinician: Referring Physician: Rutherford Nail Treating Physician/Extender: Frann Rider in Treatment: 9 Wound Status Wound Number: 1 Primary Diabetic Wound/Ulcer of the Lower Etiology: Extremity Wound Location: Right Lower Leg - Medial Wound Healed - Epithelialized Wounding Event: Trauma Status: Date Acquired: 03/30/2015 Comorbid Chronic Obstructive Pulmonary Disease Weeks Of Treatment: 9 History: (COPD), Congestive Heart Failure, Clustered Wound: No Coronary Artery Disease, Myocardial Infarction, Peripheral Arterial Disease, Peripheral Venous Disease, Type II Diabetes, Neuropathy Photos Wound Measurements Length: (cm) 0 % Reduction in Width: (cm) 0 % Reduction in Depth: (cm) 0 Epithelializat Area: (cm) 0 Tunneling: Volume: (cm) 0 Undermining: Area: 100% Volume: 100% ion: Medium (34-66%) No No Wound Description Classification: Grade 2 Foul  Odor Afte Wound Margin: Flat and Intact Exudate Amount: None Present r Cleansing: No Wound Bed Granulation Amount: None Present (0%) Exposed Structure Necrotic Amount: None Present (0%) Fascia Exposed: No Fat Layer Exposed: No Tendon Exposed: No Pogosyan, Orson Ape ([id number]) Muscle Exposed: No Joint Exposed: No Bone Exposed: No Limited to Skin Breakdown Periwound Skin Texture Texture  Color No Abnormalities Noted: No No Abnormalities Noted: No Callus: No Atrophie Blanche: No Crepitus: No Cyanosis: No Excoriation: No Ecchymosis: No Fluctuance: No Erythema: No Friable: No Hemosiderin Staining: No Induration: No Mottled: No Localized Edema: Yes Pallor: No Rash: No Rubor: No Scarring: Yes Temperature / Pain Moisture Temperature: No Abnormality No Abnormalities Noted: No Dry / Scaly: No Maceration: No Moist: No Wound Preparation Ulcer Cleansing: Rinsed/Irrigated with Saline Topical Anesthetic Applied: None Assessment Notes Patient called and said they healed Treatment Notes Wound #1 (Right, Medial Lower Leg) 1. Cleansed with: Clean wound with Normal Saline 4. Dressing Applied: Other dressing (specify in notes) 5. Secondary Dressing Applied Bordered Foam Dressing 7. Secured with Tubigrip Other (specify in notes) Notes sorbact with hydrogel ace wrap as needed Electronic Signature(s) Signed: 09/01/2015 2:26:41 PM By: Regan Lemming BSN, RN [name] ([id number]) Previous Signature: 07/16/2015 4:29:20 PM Version By: Regan Lemming BSN, RN Entered By: Regan Lemming on 09/01/2015 14:26:41 Bogen, Orson Ape ([id number]) -------------------------------------------------------------------------------- Vitals Details Patient Name: [name]. Date of Service: 07/16/2015 3:00 PM Medical Record Number: [id number] Patient Account Number: [id number] Date of Birth/Sex: Oct 24, 1960 (54 y.o. Male) Treating RN: Baruch Gouty, RN, BSN, [place] Primary Care Physician: Rutherford Nail Other Clinician: Referring Physician: Rutherford Nail Treating Physician/Extender: Frann Rider in Treatment: 9 Vital Signs Time Taken: 15:34 Temperature (F): 97.9 Height (in): 67 Pulse (bpm): 89 Weight (lbs): 248 Respiratory Rate (breaths/min): 18 Body Mass Index (BMI): 38.8 Blood Pressure (mmHg): 155/100 Reference Range: 80 - 120 mg / dl Electronic Signature(s) Signed:  07/16/2015 4:29:20 PM By: Regan Lemming BSN, RN Entered By: Regan Lemming on 07/16/2015 15:24:50

## 2015-07-21 ENCOUNTER — Other Ambulatory Visit: Payer: Self-pay | Admitting: Specialist

## 2015-07-21 DIAGNOSIS — R911 Solitary pulmonary nodule: Secondary | ICD-10-CM

## 2015-07-30 ENCOUNTER — Ambulatory Visit: Payer: No Typology Code available for payment source | Admitting: Surgery

## 2016-04-22 DIAGNOSIS — K219 Gastro-esophageal reflux disease without esophagitis: Secondary | ICD-10-CM | POA: Diagnosis not present

## 2016-04-22 DIAGNOSIS — Z23 Encounter for immunization: Secondary | ICD-10-CM | POA: Diagnosis not present

## 2016-04-22 DIAGNOSIS — I2510 Atherosclerotic heart disease of native coronary artery without angina pectoris: Secondary | ICD-10-CM | POA: Diagnosis not present

## 2016-04-22 DIAGNOSIS — E1140 Type 2 diabetes mellitus with diabetic neuropathy, unspecified: Secondary | ICD-10-CM | POA: Diagnosis not present

## 2016-04-22 DIAGNOSIS — E6609 Other obesity due to excess calories: Secondary | ICD-10-CM | POA: Diagnosis not present

## 2016-04-22 DIAGNOSIS — Z0000 Encounter for general adult medical examination without abnormal findings: Secondary | ICD-10-CM | POA: Diagnosis not present

## 2016-04-22 DIAGNOSIS — J439 Emphysema, unspecified: Secondary | ICD-10-CM | POA: Diagnosis not present

## 2016-04-22 DIAGNOSIS — I429 Cardiomyopathy, unspecified: Secondary | ICD-10-CM | POA: Diagnosis not present

## 2016-04-22 DIAGNOSIS — Z6841 Body Mass Index (BMI) 40.0 and over, adult: Secondary | ICD-10-CM | POA: Diagnosis not present

## 2016-04-22 DIAGNOSIS — E1142 Type 2 diabetes mellitus with diabetic polyneuropathy: Secondary | ICD-10-CM | POA: Diagnosis not present

## 2016-05-25 ENCOUNTER — Encounter (INDEPENDENT_AMBULATORY_CARE_PROVIDER_SITE_OTHER): Payer: Self-pay

## 2016-05-25 ENCOUNTER — Ambulatory Visit (INDEPENDENT_AMBULATORY_CARE_PROVIDER_SITE_OTHER): Payer: Self-pay | Admitting: Vascular Surgery

## 2016-05-25 ENCOUNTER — Encounter (INDEPENDENT_AMBULATORY_CARE_PROVIDER_SITE_OTHER): Payer: No Typology Code available for payment source

## 2016-06-28 DIAGNOSIS — I5040 Unspecified combined systolic (congestive) and diastolic (congestive) heart failure: Secondary | ICD-10-CM | POA: Diagnosis not present

## 2016-06-28 DIAGNOSIS — I6529 Occlusion and stenosis of unspecified carotid artery: Secondary | ICD-10-CM | POA: Diagnosis not present

## 2016-06-28 DIAGNOSIS — R0602 Shortness of breath: Secondary | ICD-10-CM | POA: Diagnosis not present

## 2016-06-28 DIAGNOSIS — I9789 Other postprocedural complications and disorders of the circulatory system, not elsewhere classified: Secondary | ICD-10-CM | POA: Diagnosis not present

## 2016-06-28 DIAGNOSIS — I2510 Atherosclerotic heart disease of native coronary artery without angina pectoris: Secondary | ICD-10-CM | POA: Diagnosis not present

## 2016-06-28 DIAGNOSIS — E1142 Type 2 diabetes mellitus with diabetic polyneuropathy: Secondary | ICD-10-CM | POA: Diagnosis not present

## 2016-06-28 DIAGNOSIS — I4891 Unspecified atrial fibrillation: Secondary | ICD-10-CM | POA: Diagnosis not present

## 2016-06-28 DIAGNOSIS — I429 Cardiomyopathy, unspecified: Secondary | ICD-10-CM | POA: Diagnosis not present

## 2016-06-28 DIAGNOSIS — J439 Emphysema, unspecified: Secondary | ICD-10-CM | POA: Diagnosis not present

## 2016-06-28 DIAGNOSIS — Z6841 Body Mass Index (BMI) 40.0 and over, adult: Secondary | ICD-10-CM | POA: Diagnosis not present

## 2016-07-05 DIAGNOSIS — R0609 Other forms of dyspnea: Secondary | ICD-10-CM | POA: Diagnosis not present

## 2016-07-05 DIAGNOSIS — I429 Cardiomyopathy, unspecified: Secondary | ICD-10-CM | POA: Diagnosis not present

## 2016-07-05 DIAGNOSIS — E119 Type 2 diabetes mellitus without complications: Secondary | ICD-10-CM | POA: Diagnosis not present

## 2016-07-05 DIAGNOSIS — E538 Deficiency of other specified B group vitamins: Secondary | ICD-10-CM | POA: Diagnosis not present

## 2016-07-05 DIAGNOSIS — I739 Peripheral vascular disease, unspecified: Secondary | ICD-10-CM | POA: Diagnosis not present

## 2016-07-05 DIAGNOSIS — D649 Anemia, unspecified: Secondary | ICD-10-CM | POA: Diagnosis not present

## 2016-07-05 DIAGNOSIS — R0602 Shortness of breath: Secondary | ICD-10-CM | POA: Diagnosis not present

## 2016-07-05 DIAGNOSIS — J439 Emphysema, unspecified: Secondary | ICD-10-CM | POA: Diagnosis not present

## 2016-07-05 DIAGNOSIS — E782 Mixed hyperlipidemia: Secondary | ICD-10-CM | POA: Diagnosis not present

## 2016-07-05 DIAGNOSIS — I2510 Atherosclerotic heart disease of native coronary artery without angina pectoris: Secondary | ICD-10-CM | POA: Diagnosis not present

## 2016-07-08 ENCOUNTER — Ambulatory Visit
Admission: RE | Admit: 2016-07-08 | Discharge: 2016-07-08 | Disposition: A | Discharge status: Home / Self Care | Payer: Commercial Managed Care - HMO | Source: Ambulatory Visit | Attending: Specialist | Admitting: Specialist

## 2016-07-08 DIAGNOSIS — J849 Interstitial pulmonary disease, unspecified: Secondary | ICD-10-CM | POA: Diagnosis not present

## 2016-07-08 DIAGNOSIS — R911 Solitary pulmonary nodule: Secondary | ICD-10-CM | POA: Diagnosis not present

## 2016-07-08 DIAGNOSIS — I2510 Atherosclerotic heart disease of native coronary artery without angina pectoris: Secondary | ICD-10-CM | POA: Insufficient documentation

## 2016-07-13 ENCOUNTER — Other Ambulatory Visit: Payer: Self-pay | Admitting: Specialist

## 2016-07-13 DIAGNOSIS — J8410 Pulmonary fibrosis, unspecified: Secondary | ICD-10-CM

## 2016-07-22 DIAGNOSIS — R0602 Shortness of breath: Secondary | ICD-10-CM | POA: Diagnosis not present

## 2016-07-26 ENCOUNTER — Other Ambulatory Visit (INDEPENDENT_AMBULATORY_CARE_PROVIDER_SITE_OTHER): Payer: Self-pay | Admitting: Vascular Surgery

## 2016-07-26 DIAGNOSIS — I739 Peripheral vascular disease, unspecified: Secondary | ICD-10-CM

## 2016-07-27 ENCOUNTER — Ambulatory Visit (INDEPENDENT_AMBULATORY_CARE_PROVIDER_SITE_OTHER): Payer: Commercial Managed Care - HMO

## 2016-07-27 ENCOUNTER — Ambulatory Visit (INDEPENDENT_AMBULATORY_CARE_PROVIDER_SITE_OTHER): Payer: Commercial Managed Care - HMO | Admitting: Vascular Surgery

## 2016-07-27 ENCOUNTER — Encounter (INDEPENDENT_AMBULATORY_CARE_PROVIDER_SITE_OTHER): Payer: Self-pay | Admitting: Vascular Surgery

## 2016-07-27 ENCOUNTER — Other Ambulatory Visit (INDEPENDENT_AMBULATORY_CARE_PROVIDER_SITE_OTHER): Payer: Self-pay | Admitting: Vascular Surgery

## 2016-07-27 VITALS — BP 132/77 | HR 87 | Resp 17 | Wt 262.0 lb

## 2016-07-27 DIAGNOSIS — I739 Peripheral vascular disease, unspecified: Secondary | ICD-10-CM | POA: Diagnosis not present

## 2016-07-27 DIAGNOSIS — E785 Hyperlipidemia, unspecified: Secondary | ICD-10-CM | POA: Diagnosis not present

## 2016-07-27 DIAGNOSIS — E1159 Type 2 diabetes mellitus with other circulatory complications: Secondary | ICD-10-CM | POA: Diagnosis not present

## 2016-08-05 DIAGNOSIS — E785 Hyperlipidemia, unspecified: Secondary | ICD-10-CM | POA: Insufficient documentation

## 2016-08-05 DIAGNOSIS — E119 Type 2 diabetes mellitus without complications: Secondary | ICD-10-CM | POA: Insufficient documentation

## 2016-08-05 DIAGNOSIS — I739 Peripheral vascular disease, unspecified: Secondary | ICD-10-CM | POA: Insufficient documentation

## 2016-08-05 NOTE — Progress Notes (Signed)
Subjective:    Patient ID: [name], male    DOB: 1960/10/27, 56 y.o.   MRN: [id number] Chief Complaint  Patient presents with  . Follow-up   Patient presents for his yearly PAD follow up. He is s/p RLE intervention 05/2015 and 04/2013. He is without complaint. Denies any claudication, rest pain or lower extremity ulceration. He underwent an ABI which was notable for Right: 0.74 and Left: 0.72 (previous on 06/25/15, Right: 0.70 and Left: 0.66).     Review of Systems  Constitutional: Negative.   HENT: Negative.   Eyes: Negative.   Respiratory: Negative.   Cardiovascular: Negative.   Gastrointestinal: Negative.   Endocrine: Negative.   Genitourinary: Negative.   Musculoskeletal: Negative.   Skin: Negative.   Allergic/Immunologic: Negative.   Neurological: Negative.   Hematological: Negative.   Psychiatric/Behavioral: Negative.        Objective:   Physical Exam  Constitutional: He is oriented to person, place, and time. He appears well-developed and well-nourished.  HENT:  Head: Normocephalic and atraumatic.  Right Ear: External ear normal.  Left Ear: External ear normal.  Eyes: Conjunctivae are normal. Pupils are equal, round, and reactive to light.  Neck: Normal range of motion.  Cardiovascular: Normal rate, regular rhythm, normal heart sounds and intact distal pulses.   Pulses:      Radial pulses are 2+ on the right side, and 2+ on the left side.       Dorsalis pedis pulses are 1+ on the right side, and 1+ on the left side.       Posterior tibial pulses are 1+ on the right side, and 1+ on the left side.  Pulmonary/Chest: Effort normal and breath sounds normal.  Abdominal: Soft. Bowel sounds are normal.  Musculoskeletal: Normal range of motion. He exhibits no edema.  Neurological: He is alert and oriented to person, place, and time.  Skin: Skin is warm and dry.  Psychiatric: He has a normal mood and affect. His behavior is normal. Judgment and thought content  normal.   BP 132/77   Pulse 87   Resp 17   Wt 262 lb (118.8 kg)   BMI 41.04 kg/m   Past Medical History:  Diagnosis Date  . Diabetes mellitus without complication ([place])   . MI (myocardial infarction)     Social History   Social History  . Marital status: Married    Spouse name: N/A  . Number of children: N/A  . Years of education: N/A   Occupational History  . Not on file.   Social History Main Topics  . Smoking status: Former Research scientist (life sciences)  . Smokeless tobacco: Not on file  . Alcohol use No  . Drug use: Unknown  . Sexual activity: Not on file   Other Topics Concern  . Not on file   Social History Narrative  . No narrative on file    Past Surgical History:  Procedure Laterality Date  . PERIPHERAL VASCULAR CATHETERIZATION N/A 06/09/2015   Procedure: Abdominal Aortogram w/Lower Extremity;  Surgeon: Katha Cabal, MD;  Location: [facility];  Service: Cardiovascular;  Laterality: N/A;  . PERIPHERAL VASCULAR CATHETERIZATION  06/09/2015   Procedure: Lower Extremity Intervention;  Surgeon: Katha Cabal, MD;  Location: [facility];  Service: Cardiovascular;;    No family history on file.  No Known Allergies     Assessment & Plan:  Patient presents for his yearly PAD follow up. He is s/p RLE intervention 05/2015 and 04/2013.  He is without complaint. Denies any claudication, rest pain or lower extremity ulceration. He underwent an ABI which was notable for Right: 0.74 and Left: 0.72 (previous on 06/25/15, Right: 0.70 and Left: 0.66).    1. PAD (peripheral artery disease) ([place]) - Stable Patient is asymptomatic with stable ABI's.  No intervention is indication at this time.  Patient to continue yearly follow up.   - VAS [place] ABI WITH/WO TBI; Future  2. Type 2 diabetes mellitus with other circulatory complication, without long-term current use of insulin (HCC) - Stable Encouraged good control as its slows the progression of atherosclerotic  disease  3. Hyperlipidemia, unspecified hyperlipidemia type - Stable Encouraged good control as its slows the progression of atherosclerotic disease  Current Outpatient Prescriptions on File Prior to Visit  Medication Sig Dispense Refill  . clopidogrel (PLAVIX) 75 MG tablet Take 1 tablet (75 mg total) by mouth daily. 30 tablet 5  . furosemide (LASIX) 20 MG tablet Take 20 mg by mouth.    . gabapentin (NEURONTIN) 300 MG capsule Take 300 mg by mouth 3 (three) times daily.    Marland Kitchen glipiZIDE (GLUCOTROL XL) 10 MG 24 hr tablet Take 10 mg by mouth daily with breakfast.    . HYDROcodone-acetaminophen (NORCO/VICODIN) 5-325 MG tablet Take 1 tablet by mouth every 6 (six) hours as needed for moderate pain.    . metFORMIN (GLUCOPHAGE) 1000 MG tablet Take 1,000 mg by mouth 2 (two) times daily with a meal.    . pravastatin (PRAVACHOL) 40 MG tablet Take 40 mg by mouth daily.     No current facility-administered medications on file prior to visit.     There are no Patient Instructions on file for this visit. No Follow-up on file.   Ashaki Frosch A Twylah Bennetts, PA-C

## 2016-10-03 DIAGNOSIS — I9789 Other postprocedural complications and disorders of the circulatory system, not elsewhere classified: Secondary | ICD-10-CM | POA: Diagnosis not present

## 2016-10-03 DIAGNOSIS — I429 Cardiomyopathy, unspecified: Secondary | ICD-10-CM | POA: Diagnosis not present

## 2016-10-03 DIAGNOSIS — Z951 Presence of aortocoronary bypass graft: Secondary | ICD-10-CM | POA: Diagnosis not present

## 2016-10-03 DIAGNOSIS — I4891 Unspecified atrial fibrillation: Secondary | ICD-10-CM | POA: Diagnosis not present

## 2016-10-03 DIAGNOSIS — I6529 Occlusion and stenosis of unspecified carotid artery: Secondary | ICD-10-CM | POA: Diagnosis not present

## 2016-10-03 DIAGNOSIS — I2510 Atherosclerotic heart disease of native coronary artery without angina pectoris: Secondary | ICD-10-CM | POA: Diagnosis not present

## 2016-10-03 DIAGNOSIS — I214 Non-ST elevation (NSTEMI) myocardial infarction: Secondary | ICD-10-CM | POA: Diagnosis not present

## 2016-11-01 DIAGNOSIS — D649 Anemia, unspecified: Secondary | ICD-10-CM | POA: Diagnosis not present

## 2016-11-01 DIAGNOSIS — I2510 Atherosclerotic heart disease of native coronary artery without angina pectoris: Secondary | ICD-10-CM | POA: Diagnosis not present

## 2016-11-01 DIAGNOSIS — E782 Mixed hyperlipidemia: Secondary | ICD-10-CM | POA: Diagnosis not present

## 2016-11-01 DIAGNOSIS — R0602 Shortness of breath: Secondary | ICD-10-CM | POA: Diagnosis not present

## 2016-11-01 DIAGNOSIS — Z6841 Body Mass Index (BMI) 40.0 and over, adult: Secondary | ICD-10-CM | POA: Diagnosis not present

## 2016-11-01 DIAGNOSIS — J439 Emphysema, unspecified: Secondary | ICD-10-CM | POA: Diagnosis not present

## 2016-11-01 DIAGNOSIS — I739 Peripheral vascular disease, unspecified: Secondary | ICD-10-CM | POA: Diagnosis not present

## 2016-11-01 DIAGNOSIS — I429 Cardiomyopathy, unspecified: Secondary | ICD-10-CM | POA: Diagnosis not present

## 2016-11-01 DIAGNOSIS — E119 Type 2 diabetes mellitus without complications: Secondary | ICD-10-CM | POA: Diagnosis not present

## 2016-11-04 DIAGNOSIS — D649 Anemia, unspecified: Secondary | ICD-10-CM | POA: Diagnosis not present

## 2016-11-08 DIAGNOSIS — Z794 Long term (current) use of insulin: Secondary | ICD-10-CM | POA: Diagnosis not present

## 2016-11-08 DIAGNOSIS — Z0000 Encounter for general adult medical examination without abnormal findings: Secondary | ICD-10-CM | POA: Diagnosis not present

## 2016-11-08 DIAGNOSIS — E1140 Type 2 diabetes mellitus with diabetic neuropathy, unspecified: Secondary | ICD-10-CM | POA: Diagnosis not present

## 2016-11-08 DIAGNOSIS — J439 Emphysema, unspecified: Secondary | ICD-10-CM | POA: Diagnosis not present

## 2016-11-08 DIAGNOSIS — G2581 Restless legs syndrome: Secondary | ICD-10-CM | POA: Diagnosis not present

## 2016-11-08 DIAGNOSIS — I429 Cardiomyopathy, unspecified: Secondary | ICD-10-CM | POA: Diagnosis not present

## 2016-11-08 DIAGNOSIS — I739 Peripheral vascular disease, unspecified: Secondary | ICD-10-CM | POA: Diagnosis not present

## 2016-11-20 DIAGNOSIS — J019 Acute sinusitis, unspecified: Secondary | ICD-10-CM | POA: Diagnosis not present

## 2017-01-04 ENCOUNTER — Ambulatory Visit (INDEPENDENT_AMBULATORY_CARE_PROVIDER_SITE_OTHER): Payer: Medicare HMO | Admitting: Vascular Surgery

## 2017-01-04 ENCOUNTER — Other Ambulatory Visit (INDEPENDENT_AMBULATORY_CARE_PROVIDER_SITE_OTHER): Payer: Medicare HMO

## 2017-01-04 ENCOUNTER — Other Ambulatory Visit (INDEPENDENT_AMBULATORY_CARE_PROVIDER_SITE_OTHER): Payer: Self-pay | Admitting: Vascular Surgery

## 2017-01-04 ENCOUNTER — Encounter (INDEPENDENT_AMBULATORY_CARE_PROVIDER_SITE_OTHER): Payer: Self-pay | Admitting: Vascular Surgery

## 2017-01-04 VITALS — BP 127/77 | HR 79 | Resp 16 | Ht 67.0 in | Wt 257.0 lb

## 2017-01-04 DIAGNOSIS — I739 Peripheral vascular disease, unspecified: Secondary | ICD-10-CM

## 2017-01-04 DIAGNOSIS — E785 Hyperlipidemia, unspecified: Secondary | ICD-10-CM

## 2017-01-04 DIAGNOSIS — E1159 Type 2 diabetes mellitus with other circulatory complications: Secondary | ICD-10-CM | POA: Diagnosis not present

## 2017-01-04 DIAGNOSIS — Z95828 Presence of other vascular implants and grafts: Secondary | ICD-10-CM

## 2017-01-04 NOTE — Progress Notes (Signed)
Subjective:    Patient ID: [name], male    DOB: February 22, 1961, 56 y.o.   MRN: [id number] Chief Complaint  Patient presents with  . Follow-up     ultrasound   Patient presents for 6 month PAD follow-up. The patient is without complaint today he denies any claudication, rest pain or ulceration to the bilateral lower extremity. The patient underwent a lower extremity ABI which was notable for bilateral 8 ankle brachial indices suggest moderate bilateral lower extremity arterial disease. Bilateral toe brachial indices are abnormal. When compared to the previous exam 6 months ago, there is no significant change in the bilateral ankle brachial indices. The patient also underwent an aortoiliac arterial duplex exam which was limited due to the overlying bowel gas patterns which the bilateral iliac artery stents were not adequately visualized patent bilateral common and proximal deep femoral arteries with no significant stenosis known right superficial femoral artery occlusion consistent with previous angiograms with a left proximal superficial femoral artery appearing to be occluded as well. Patient denies any fever, nausea or vomiting.   Review of Systems  Constitutional: Negative.   HENT: Negative.   Eyes: Negative.   Respiratory: Negative.   Cardiovascular: Negative.   Gastrointestinal: Negative.   Endocrine: Negative.   Genitourinary: Negative.   Musculoskeletal: Negative.   Skin: Negative.   Allergic/Immunologic: Negative.   Neurological: Negative.   Hematological: Negative.   Psychiatric/Behavioral: Negative.       Objective:   Physical Exam  Constitutional: He is oriented to person, place, and time. He appears well-developed and well-nourished. No distress.  HENT:  Head: Normocephalic and atraumatic.  Eyes: Conjunctivae are normal. Pupils are equal, round, and reactive to light.  Neck: Normal range of motion.  Cardiovascular: Normal rate, regular rhythm, normal heart sounds  and intact distal pulses.   Pulses:      Radial pulses are 2+ on the right side, and 2+ on the left side.  Unable to palpate pedal pulses however bilateral feet are warm.  Pulmonary/Chest: Effort normal.  Musculoskeletal: Normal range of motion. He exhibits edema (Mild bilateral pedal edema noted.).  Neurological: He is alert and oriented to person, place, and time.  Skin: Skin is warm and dry. He is not diaphoretic.  Psychiatric: He has a normal mood and affect. His behavior is normal. Judgment and thought content normal.  Vitals reviewed.   BP 127/77   Pulse 79   Resp 16   Ht 5\' 7"  (1.702 m)   Wt 257 lb (116.6 kg)   BMI 40.25 kg/m   Past Medical History:  Diagnosis Date  . Diabetes mellitus without complication ([place])   . MI (myocardial infarction) [facility])     Social History   Social History  . Marital status: Married    Spouse name: N/A  . Number of children: N/A  . Years of education: N/A   Occupational History  . Not on file.   Social History Main Topics  . Smoking status: Former Research scientist (life sciences)  . Smokeless tobacco: Never Used  . Alcohol use No  . Drug use: Unknown  . Sexual activity: Not on file   Other Topics Concern  . Not on file   Social History Narrative  . No narrative on file    Past Surgical History:  Procedure Laterality Date  . PERIPHERAL VASCULAR CATHETERIZATION N/A 06/09/2015   Procedure: Abdominal Aortogram w/Lower Extremity;  Surgeon: Katha Cabal, MD;  Location: [place] [facility];  Service: Cardiovascular;  Laterality: N/A;  .  PERIPHERAL VASCULAR CATHETERIZATION  06/09/2015   Procedure: Lower Extremity Intervention;  Surgeon: Katha Cabal, MD;  Location: [place] [facility];  Service: Cardiovascular;;    No family history on file.  No Known Allergies     Assessment & Plan:  Patient presents for 6 month PAD follow-up. The patient is without complaint today he denies any claudication, rest pain or ulceration to the bilateral lower  extremity. The patient underwent a lower extremity ABI which was notable for bilateral 8 ankle brachial indices suggest moderate bilateral lower extremity arterial disease. Bilateral toe brachial indices are abnormal. When compared to the previous exam 6 months ago, there is no significant change in the bilateral ankle brachial indices. The patient also underwent an aortoiliac arterial duplex exam which was limited due to the overlying bowel gas patterns which the bilateral iliac artery stents were not adequately visualized patent bilateral common and proximal deep femoral arteries with no significant stenosis known right superficial femoral artery occlusion consistent with previous angiograms with a left proximal superficial femoral artery appearing to be occluded as well. Patient denies any fever, nausea or vomiting.  1. PAD (peripheral artery disease) ([place]) - stable Patient has known peripheral artery disease.  It is followed on a 6 month basis. The patient is without complaint today denying claudication, rest pain and ulcerations the lower extremity. He remains active and continues to work.  Will continue to surveil every 6 months.  - VAS [place] ABI WITH/WO TBI; Future - VAS [place] LOWER EXTREMITY ARTERIAL DUPLEX; Future  2. Type 2 diabetes mellitus with other circulatory complication, without long-term current use of insulin (HCC) - stable Encouraged good control as its slows the progression of atherosclerotic disease  3. Hyperlipidemia, unspecified hyperlipidemia type - stable Encouraged good control as its slows the progression of atherosclerotic disease  Current Outpatient Prescriptions on File Prior to Visit  Medication Sig Dispense Refill  . furosemide (LASIX) 20 MG tablet Take 20 mg by mouth.    . gabapentin (NEURONTIN) 300 MG capsule Take 300 mg by mouth 3 (three) times daily.    Marland Kitchen glipiZIDE (GLUCOTROL XL) 10 MG 24 hr tablet Take 10 mg by mouth daily with breakfast.    .  HYDROcodone-acetaminophen (NORCO/VICODIN) 5-325 MG tablet Take 1 tablet by mouth every 6 (six) hours as needed for moderate pain.    . metFORMIN (GLUCOPHAGE) 1000 MG tablet Take 1,000 mg by mouth 2 (two) times daily with a meal.    . pravastatin (PRAVACHOL) 40 MG tablet Take 40 mg by mouth daily.    . clopidogrel (PLAVIX) 75 MG tablet Take 1 tablet (75 mg total) by mouth daily. (Patient not taking: Reported on 01/04/2017) 30 tablet 5   No current facility-administered medications on file prior to visit.     There are no Patient Instructions on file for this visit. No Follow-up on file.   Marbella Markgraf A Declynn Lopresti, PA-C

## 2017-01-11 ENCOUNTER — Ambulatory Visit
Admission: RE | Admit: 2017-01-11 | Discharge: 2017-01-11 | Disposition: A | Discharge status: Home / Self Care | Payer: Medicare HMO | Source: Ambulatory Visit | Attending: Specialist | Admitting: Specialist

## 2017-01-11 ENCOUNTER — Other Ambulatory Visit: Payer: Self-pay | Admitting: Specialist

## 2017-01-11 DIAGNOSIS — I700 Atherosclerosis of aorta: Secondary | ICD-10-CM | POA: Diagnosis not present

## 2017-01-11 DIAGNOSIS — J8410 Pulmonary fibrosis, unspecified: Secondary | ICD-10-CM | POA: Diagnosis not present

## 2017-01-18 DIAGNOSIS — J849 Interstitial pulmonary disease, unspecified: Secondary | ICD-10-CM | POA: Diagnosis not present

## 2017-01-18 DIAGNOSIS — R0609 Other forms of dyspnea: Secondary | ICD-10-CM | POA: Diagnosis not present

## 2017-01-18 DIAGNOSIS — Z6841 Body Mass Index (BMI) 40.0 and over, adult: Secondary | ICD-10-CM | POA: Diagnosis not present

## 2017-02-28 DIAGNOSIS — I739 Peripheral vascular disease, unspecified: Secondary | ICD-10-CM | POA: Diagnosis not present

## 2017-02-28 DIAGNOSIS — J439 Emphysema, unspecified: Secondary | ICD-10-CM | POA: Diagnosis not present

## 2017-02-28 DIAGNOSIS — Z0000 Encounter for general adult medical examination without abnormal findings: Secondary | ICD-10-CM | POA: Diagnosis not present

## 2017-02-28 DIAGNOSIS — E119 Type 2 diabetes mellitus without complications: Secondary | ICD-10-CM | POA: Diagnosis not present

## 2017-02-28 DIAGNOSIS — I429 Cardiomyopathy, unspecified: Secondary | ICD-10-CM | POA: Diagnosis not present

## 2017-02-28 DIAGNOSIS — I2584 Coronary atherosclerosis due to calcified coronary lesion: Secondary | ICD-10-CM | POA: Diagnosis not present

## 2017-02-28 DIAGNOSIS — Z794 Long term (current) use of insulin: Secondary | ICD-10-CM | POA: Diagnosis not present

## 2017-02-28 DIAGNOSIS — I2510 Atherosclerotic heart disease of native coronary artery without angina pectoris: Secondary | ICD-10-CM | POA: Diagnosis not present

## 2017-03-07 DIAGNOSIS — Z794 Long term (current) use of insulin: Secondary | ICD-10-CM | POA: Diagnosis not present

## 2017-03-07 DIAGNOSIS — E538 Deficiency of other specified B group vitamins: Secondary | ICD-10-CM | POA: Diagnosis not present

## 2017-03-07 DIAGNOSIS — E119 Type 2 diabetes mellitus without complications: Secondary | ICD-10-CM | POA: Diagnosis not present

## 2017-03-07 DIAGNOSIS — K219 Gastro-esophageal reflux disease without esophagitis: Secondary | ICD-10-CM | POA: Diagnosis not present

## 2017-03-07 DIAGNOSIS — I739 Peripheral vascular disease, unspecified: Secondary | ICD-10-CM | POA: Diagnosis not present

## 2017-03-07 DIAGNOSIS — J439 Emphysema, unspecified: Secondary | ICD-10-CM | POA: Diagnosis not present

## 2017-03-07 DIAGNOSIS — I429 Cardiomyopathy, unspecified: Secondary | ICD-10-CM | POA: Diagnosis not present

## 2017-03-07 DIAGNOSIS — Z6841 Body Mass Index (BMI) 40.0 and over, adult: Secondary | ICD-10-CM | POA: Diagnosis not present

## 2017-04-19 DIAGNOSIS — E785 Hyperlipidemia, unspecified: Secondary | ICD-10-CM | POA: Diagnosis not present

## 2017-04-19 DIAGNOSIS — I4891 Unspecified atrial fibrillation: Secondary | ICD-10-CM | POA: Diagnosis not present

## 2017-04-19 DIAGNOSIS — I2510 Atherosclerotic heart disease of native coronary artery without angina pectoris: Secondary | ICD-10-CM | POA: Diagnosis not present

## 2017-04-19 DIAGNOSIS — I9789 Other postprocedural complications and disorders of the circulatory system, not elsewhere classified: Secondary | ICD-10-CM | POA: Diagnosis not present

## 2017-04-19 DIAGNOSIS — I6529 Occlusion and stenosis of unspecified carotid artery: Secondary | ICD-10-CM | POA: Diagnosis not present

## 2017-06-02 DIAGNOSIS — Z125 Encounter for screening for malignant neoplasm of prostate: Secondary | ICD-10-CM | POA: Diagnosis not present

## 2017-06-02 DIAGNOSIS — Z0000 Encounter for general adult medical examination without abnormal findings: Secondary | ICD-10-CM | POA: Diagnosis not present

## 2017-06-02 DIAGNOSIS — E6609 Other obesity due to excess calories: Secondary | ICD-10-CM | POA: Diagnosis not present

## 2017-06-02 DIAGNOSIS — D62 Acute posthemorrhagic anemia: Secondary | ICD-10-CM | POA: Diagnosis not present

## 2017-06-02 DIAGNOSIS — E785 Hyperlipidemia, unspecified: Secondary | ICD-10-CM | POA: Diagnosis not present

## 2017-06-02 DIAGNOSIS — E1142 Type 2 diabetes mellitus with diabetic polyneuropathy: Secondary | ICD-10-CM | POA: Diagnosis not present

## 2017-06-07 DIAGNOSIS — E119 Type 2 diabetes mellitus without complications: Secondary | ICD-10-CM | POA: Diagnosis not present

## 2017-06-07 DIAGNOSIS — Z23 Encounter for immunization: Secondary | ICD-10-CM | POA: Diagnosis not present

## 2017-06-07 DIAGNOSIS — Z794 Long term (current) use of insulin: Secondary | ICD-10-CM | POA: Diagnosis not present

## 2017-06-07 DIAGNOSIS — I2510 Atherosclerotic heart disease of native coronary artery without angina pectoris: Secondary | ICD-10-CM | POA: Diagnosis not present

## 2017-06-07 DIAGNOSIS — M79641 Pain in right hand: Secondary | ICD-10-CM | POA: Diagnosis not present

## 2017-06-07 DIAGNOSIS — Z951 Presence of aortocoronary bypass graft: Secondary | ICD-10-CM | POA: Diagnosis not present

## 2017-06-07 DIAGNOSIS — J209 Acute bronchitis, unspecified: Secondary | ICD-10-CM | POA: Diagnosis not present

## 2017-06-07 DIAGNOSIS — I429 Cardiomyopathy, unspecified: Secondary | ICD-10-CM | POA: Diagnosis not present

## 2017-06-07 DIAGNOSIS — J439 Emphysema, unspecified: Secondary | ICD-10-CM | POA: Diagnosis not present

## 2017-07-07 ENCOUNTER — Other Ambulatory Visit (INDEPENDENT_AMBULATORY_CARE_PROVIDER_SITE_OTHER): Payer: Self-pay | Admitting: Vascular Surgery

## 2017-07-07 DIAGNOSIS — Z95828 Presence of other vascular implants and grafts: Secondary | ICD-10-CM

## 2017-07-10 ENCOUNTER — Other Ambulatory Visit (INDEPENDENT_AMBULATORY_CARE_PROVIDER_SITE_OTHER): Payer: Self-pay | Admitting: Vascular Surgery

## 2017-07-10 ENCOUNTER — Ambulatory Visit (INDEPENDENT_AMBULATORY_CARE_PROVIDER_SITE_OTHER): Payer: Medicare HMO

## 2017-07-10 ENCOUNTER — Ambulatory Visit (INDEPENDENT_AMBULATORY_CARE_PROVIDER_SITE_OTHER): Payer: Medicare HMO | Admitting: Vascular Surgery

## 2017-07-10 VITALS — BP 144/78 | HR 80 | Resp 16 | Ht 67.0 in | Wt 258.0 lb

## 2017-07-10 DIAGNOSIS — I6529 Occlusion and stenosis of unspecified carotid artery: Secondary | ICD-10-CM | POA: Insufficient documentation

## 2017-07-10 DIAGNOSIS — I6523 Occlusion and stenosis of bilateral carotid arteries: Secondary | ICD-10-CM

## 2017-07-10 DIAGNOSIS — Z95828 Presence of other vascular implants and grafts: Secondary | ICD-10-CM

## 2017-07-10 DIAGNOSIS — E785 Hyperlipidemia, unspecified: Secondary | ICD-10-CM

## 2017-07-10 DIAGNOSIS — I739 Peripheral vascular disease, unspecified: Secondary | ICD-10-CM

## 2017-07-10 DIAGNOSIS — I771 Stricture of artery: Secondary | ICD-10-CM

## 2017-07-10 DIAGNOSIS — E1159 Type 2 diabetes mellitus with other circulatory complications: Secondary | ICD-10-CM

## 2017-07-15 ENCOUNTER — Encounter (INDEPENDENT_AMBULATORY_CARE_PROVIDER_SITE_OTHER): Payer: Self-pay | Admitting: Vascular Surgery

## 2017-07-15 NOTE — Progress Notes (Signed)
MRN : [id number]  [name] is a 56 y.o. (10/23/1960) male who presents with chief complaint of  Chief Complaint  Patient presents with  . Follow-up    Follow up Carotid/ABI  .  History of Present Illness: The patient is seen for follow up evaluation of carotid stenosis. The carotid stenosis followed by ultrasound.   The patient denies amaurosis fugax. There is no recent history of TIA symptoms or focal motor deficits. There is no prior documented CVA.  The patient is taking enteric-coated aspirin 81 mg daily.  There is no history of migraine headaches. There is no history of seizures.  The patient has a history of coronary artery disease, no recent episodes of angina or shortness of breath. The patient denies PAD or claudication symptoms. There is a history of hyperlipidemia which is being treated with a statin.    Carotid Duplex done today shows 78-58% RICA and >85% LICA.   ABI's unchanged  Current Meds  Medication Sig  . allopurinol (ZYLOPRIM) 100 MG tablet Take 100 mg by mouth daily.  . cefUROXime (CEFTIN) 250 MG tablet TAKE 1 TABLET (250 MG TOTAL) BY MOUTH EVERY 12 (TWELVE) HOURS  . clopidogrel (PLAVIX) 75 MG tablet Take 1 tablet (75 mg total) by mouth daily.  . DULoxetine (CYMBALTA) 30 MG capsule TAKE 1 CAPSULE (30 MG TOTAL) BY MOUTH ONCE DAILY.  . furosemide (LASIX) 20 MG tablet Take 20 mg by mouth.  . gabapentin (NEURONTIN) 300 MG capsule Take 300 mg by mouth 3 (three) times daily.  Marland Kitchen glipiZIDE (GLUCOTROL XL) 10 MG 24 hr tablet Take 10 mg by mouth daily with breakfast.  . HYDROcodone-acetaminophen (NORCO/VICODIN) 5-325 MG tablet Take 1 tablet by mouth every 6 (six) hours as needed for moderate pain.  Marland Kitchen lisinopril (PRINIVIL,ZESTRIL) 2.5 MG tablet   . magnesium oxide (MAGOX 400) 400 (241.3 Mg) MG tablet Take by mouth.  . metFORMIN (GLUCOPHAGE) 1000 MG tablet Take 1,000 mg by mouth 2 (two) times daily with a meal.  . pravastatin (PRAVACHOL) 40 MG tablet Take 40 mg  by mouth daily.  Marland Kitchen rOPINIRole (REQUIP) 1 MG tablet Take by mouth.  . tamsulosin (FLOMAX) 0.4 MG CAPS capsule TAKE 1 CAPSULE (0.4 MG TOTAL) BY MOUTH ONCE DAILY. TAKE 30 MINUTES AFTER SAME MEAL EACH DAY.    Past Medical History:  Diagnosis Date  . Diabetes mellitus without complication ([place])   . MI (myocardial infarction) [facility])     Past Surgical History:  Procedure Laterality Date  . PERIPHERAL VASCULAR CATHETERIZATION N/A 06/09/2015   Procedure: Abdominal Aortogram w/Lower Extremity;  Surgeon: Katha Cabal, MD;  Location: [place] [facility];  Service: Cardiovascular;  Laterality: N/A;  . PERIPHERAL VASCULAR CATHETERIZATION  06/09/2015   Procedure: Lower Extremity Intervention;  Surgeon: Katha Cabal, MD;  Location: [place] [facility];  Service: Cardiovascular;;    Social History Social History   Tobacco Use  . Smoking status: Former Research scientist (life sciences)  . Smokeless tobacco: Never Used  Substance Use Topics  . Alcohol use: No  . Drug use: Not on file    Family History History reviewed. No pertinent family history.  No Known Allergies   REVIEW OF SYSTEMS (Negative unless checked)  Constitutional: [] Weight loss  [] Fever  [] Chills Cardiac: [] Chest pain   [] Chest pressure   [] Palpitations   [] Shortness of breath when laying flat   [] Shortness of breath with exertion. Vascular:  [x] Pain in legs with walking   [] Pain in legs at rest  [] History  of DVT   [] Phlebitis   [] Swelling in legs   [] Varicose veins   [] Non-healing ulcers Pulmonary:   [] Uses home oxygen   [] Productive cough   [] Hemoptysis   [] Wheeze  [] COPD   [] Asthma Neurologic:  [] Dizziness   [] Seizures   [] History of stroke   [] History of TIA  [] Aphasia   [] Vissual changes   [] Weakness or numbness in arm   [] Weakness or numbness in leg Musculoskeletal:   [] Joint swelling   [] Joint pain   [] Low back pain Hematologic:  [] Easy bruising  [] Easy bleeding   [] Hypercoagulable state   [] Anemic Gastrointestinal:  [] Diarrhea    [] Vomiting  [] Gastroesophageal reflux/heartburn   [] Difficulty swallowing. Genitourinary:  [] Chronic kidney disease   [] Difficult urination  [] Frequent urination   [] Blood in urine Skin:  [] Rashes   [] Ulcers  Psychological:  [] History of anxiety   []  History of major depression.  Physical Examination  Vitals:   07/10/17 1043 07/10/17 1044  BP: (!) 152/63 (!) 144/78  Pulse: 80   Resp: 16   Weight: 117 kg (258 lb)   Height: 5\' 7"  (1.702 m)    Body mass index is 40.41 kg/m. Gen: WD/WN, NAD Head: [place]/AT, No temporalis wasting.  Ear/Nose/Throat: Hearing grossly intact, nares w/o erythema or drainage Eyes: PER, EOMI, sclera nonicteric.  Neck: Supple, no large masses.   Pulmonary:  Good air movement, no audible wheezing bilaterally, no use of accessory muscles.  Cardiac: RRR, no JVD Vascular:  Bilateral carotid bruit Vessel Right Left  Radial Palpable Palpable  Ulnar Palpable Palpable  Brachial Palpable Palpable  Carotid Palpable Palpable  Femoral Palpable Palpable  Popliteal Not Palpable Not Palpable  PT Not Palpable Not Palpable  DP Not Palpable Not Palpable  Gastrointestinal: Non-distended. No guarding/no peritoneal signs.  Musculoskeletal: M/S 5/5 throughout.  No deformity or atrophy.  Neurologic: CN 2-12 intact. Symmetrical.  Speech is fluent. Motor exam as listed above. Psychiatric: Judgment intact, Mood & affect appropriate for pt's clinical situation. Dermatologic: No rashes or ulcers noted.  No changes consistent with cellulitis. Lymph : No lichenification or skin changes of chronic lymphedema.  CBC Lab Results  Component Value Date   WBC 13.5 (H) 10/22/2013   HGB 12.7 (L) 10/22/2013   HCT 38.8 (L) 10/22/2013   MCV 90 10/22/2013   PLT 125 (L) 10/22/2013    BMET    Component Value Date/Time   NA 134 (L) 06/09/2015 0758   NA 134 (L) 10/22/2013 0507   K 4.7 06/09/2015 0758   K 4.5 10/22/2013 0507   CL 103 06/09/2015 0758   CL 101 10/22/2013 0507   CO2 25  06/09/2015 0758   CO2 28 10/22/2013 0507   GLUCOSE 197 (H) 06/09/2015 0758   GLUCOSE 300 (H) 10/22/2013 0507   BUN 26 (H) 06/09/2015 0758   BUN 32 (H) 10/22/2013 0507   CREATININE 0.87 06/09/2015 0758   CREATININE 1.18 10/22/2013 0507   CALCIUM 9.9 06/09/2015 0758   CALCIUM 8.0 (L) 10/22/2013 0507   GFRNONAA >60 06/09/2015 0758   GFRNONAA >60 10/22/2013 0507   GFRAA >60 06/09/2015 0758   GFRAA >60 10/22/2013 0507   CrCl cannot be calculated (Patient's most recent lab result is older than the maximum 21 days allowed.).  COAG No results found for: INR, PROTIME  Radiology No results found.  Assessment/Plan 1. Bilateral carotid artery stenosis The patient remains asymptomatic with respect to the carotid stenosis.  However, the patient has now progressed and has a lesion the is >70%.  Patient should undergo CT angiography of the carotid arteries to define the degree of stenosis of the internal carotid arteries bilaterally and the anatomic suitability for surgery vs. intervention.  If the patient does indeed need surgery cardiac clearance will be required, once cleared the patient will be scheduled for surgery.  The risks, benefits and alternative therapies were reviewed in detail with the patient.  All questions were answered.  The patient agrees to proceed with imaging.  Continue antiplatelet therapy as prescribed. Continue management of CAD, HTN and Hyperlipidemia. Healthy heart diet, encouraged exercise at least 4 times per week.    2. Stricture of artery ([place]) The patient remains asymptomatic with respect to the carotid stenosis.  However, the patient has now progressed and has a lesion the is >70%.  Patient should undergo CT angiography of the carotid arteries to define the degree of stenosis of the internal carotid arteries bilaterally and the anatomic suitability for surgery vs. intervention.  If the patient does indeed need surgery cardiac clearance will be required,  once cleared the patient will be scheduled for surgery.  The risks, benefits and alternative therapies were reviewed in detail with the patient.  All questions were answered.  The patient agrees to proceed with imaging.  Continue antiplatelet therapy as prescribed. Continue management of CAD, HTN and Hyperlipidemia. Healthy heart diet, encouraged exercise at least 4 times per week.   - CT ANGIO NECK W OR WO CONTRAST; Future  3. PAD (peripheral artery disease) (HCC)  Recommend:  The patient has evidence of atherosclerosis of the lower extremities with claudication.  The patient does not voice lifestyle limiting changes at this point in time.  Noninvasive studies do not suggest clinically significant change.  No invasive studies, angiography or surgery at this time The patient should continue walking and begin a more formal exercise program.  The patient should continue antiplatelet therapy and aggressive treatment of the lipid abnormalities  No changes in the patient's medications at this time  The patient should continue wearing graduated compression socks 10-15 mmHg strength to control the mild edema.    4. Type 2 diabetes mellitus with other circulatory complication, without long-term current use of insulin (HCC) Continue hypoglycemic medications as already ordered, these medications have been reviewed and there are no changes at this time.  Hgb A1C to be monitored as already arranged by primary service   5. Hyperlipidemia, unspecified hyperlipidemia type Continue statin as ordered and reviewed, no changes at this time     Hortencia Pilar, MD  07/15/2017 12:51 PM

## 2017-07-19 ENCOUNTER — Ambulatory Visit
Admission: RE | Admit: 2017-07-19 | Discharge: 2017-07-19 | Disposition: A | Discharge status: Home / Self Care | Payer: Medicare HMO | Source: Ambulatory Visit | Attending: Vascular Surgery | Admitting: Vascular Surgery

## 2017-07-19 DIAGNOSIS — I708 Atherosclerosis of other arteries: Secondary | ICD-10-CM | POA: Insufficient documentation

## 2017-07-19 DIAGNOSIS — I700 Atherosclerosis of aorta: Secondary | ICD-10-CM | POA: Insufficient documentation

## 2017-07-19 DIAGNOSIS — I6523 Occlusion and stenosis of bilateral carotid arteries: Secondary | ICD-10-CM | POA: Diagnosis not present

## 2017-07-19 DIAGNOSIS — I771 Stricture of artery: Secondary | ICD-10-CM | POA: Insufficient documentation

## 2017-07-19 DIAGNOSIS — Z951 Presence of aortocoronary bypass graft: Secondary | ICD-10-CM | POA: Insufficient documentation

## 2017-07-19 HISTORY — DX: Heart failure, unspecified: I50.9

## 2017-07-19 HISTORY — DX: Malignant (primary) neoplasm, unspecified: C80.1

## 2017-07-19 LAB — POCT I-STAT CREATININE: Creatinine, Ser: 1.1 mg/dL (ref 0.61–1.24)

## 2017-07-19 MED ORDER — IOPAMIDOL (ISOVUE-370) INJECTION 76%
75.0000 mL | Freq: Once | INTRAVENOUS | Status: AC | PRN
  Administered 2017-07-19: 75 mL via INTRAVENOUS

## 2017-07-20 ENCOUNTER — Ambulatory Visit (INDEPENDENT_AMBULATORY_CARE_PROVIDER_SITE_OTHER): Payer: Medicare HMO | Admitting: Vascular Surgery

## 2017-07-20 ENCOUNTER — Encounter (INDEPENDENT_AMBULATORY_CARE_PROVIDER_SITE_OTHER): Payer: Self-pay | Admitting: Vascular Surgery

## 2017-07-20 VITALS — BP 98/68 | HR 67 | Resp 17 | Ht 67.0 in | Wt 234.0 lb

## 2017-07-20 DIAGNOSIS — I6523 Occlusion and stenosis of bilateral carotid arteries: Secondary | ICD-10-CM | POA: Diagnosis not present

## 2017-07-20 DIAGNOSIS — E785 Hyperlipidemia, unspecified: Secondary | ICD-10-CM | POA: Diagnosis not present

## 2017-07-20 DIAGNOSIS — E1159 Type 2 diabetes mellitus with other circulatory complications: Secondary | ICD-10-CM

## 2017-07-22 IMAGING — CR DG TIBIA/FIBULA 2V*R*
1 series · 4 of 4 positions shown · non-contrast
Comparison: None.

CLINICAL DATA: Trip and fall injury this afternoon. Laceration to
the proximal lateral right lower leg.

EXAM:
RIGHT TIBIA AND FIBULA - 2 VIEW

[Series 1: dg tibia/fibula right · 0.14mm/px · 4 of 4 slices shown]
[im 1/4]
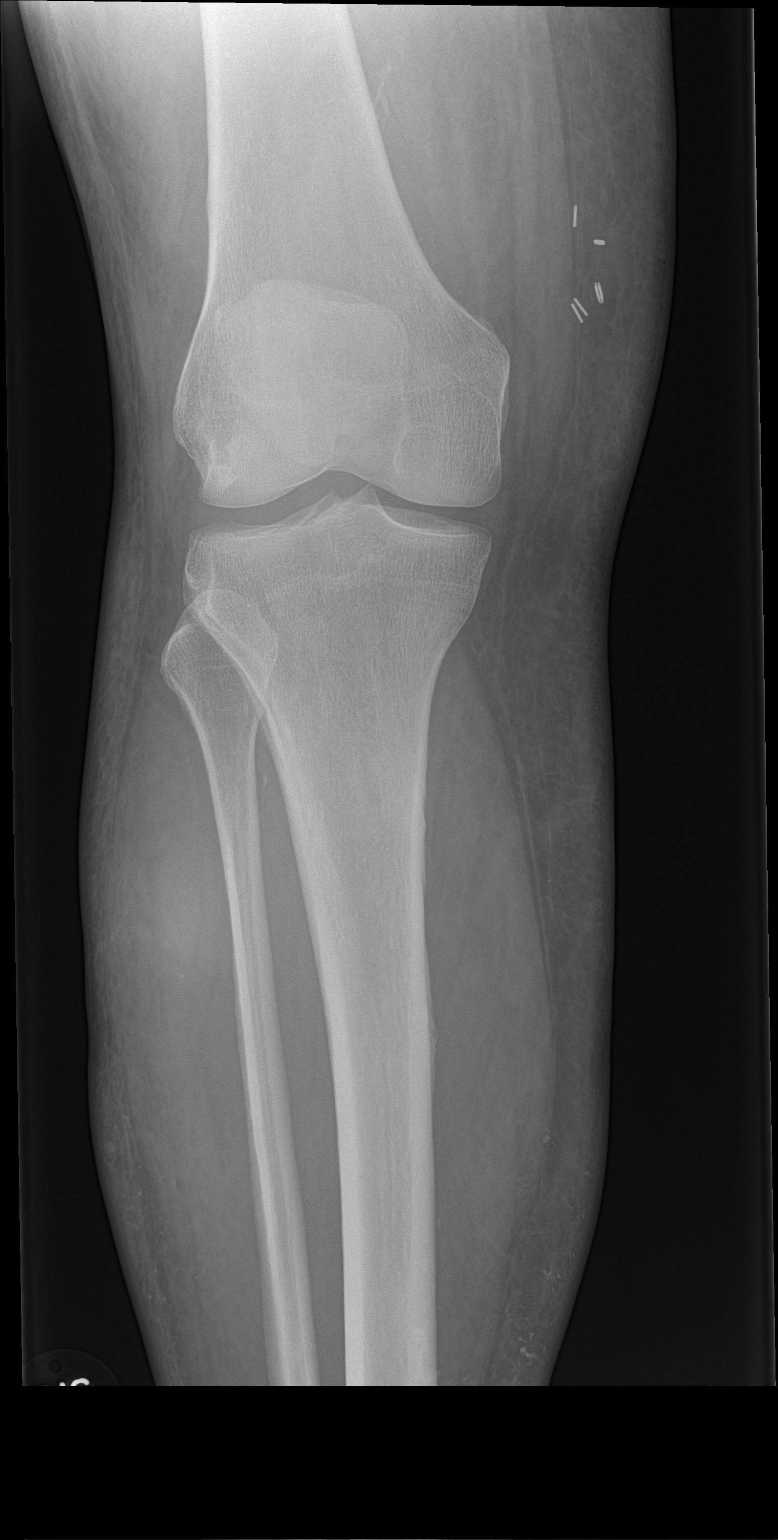
[im 2/4]
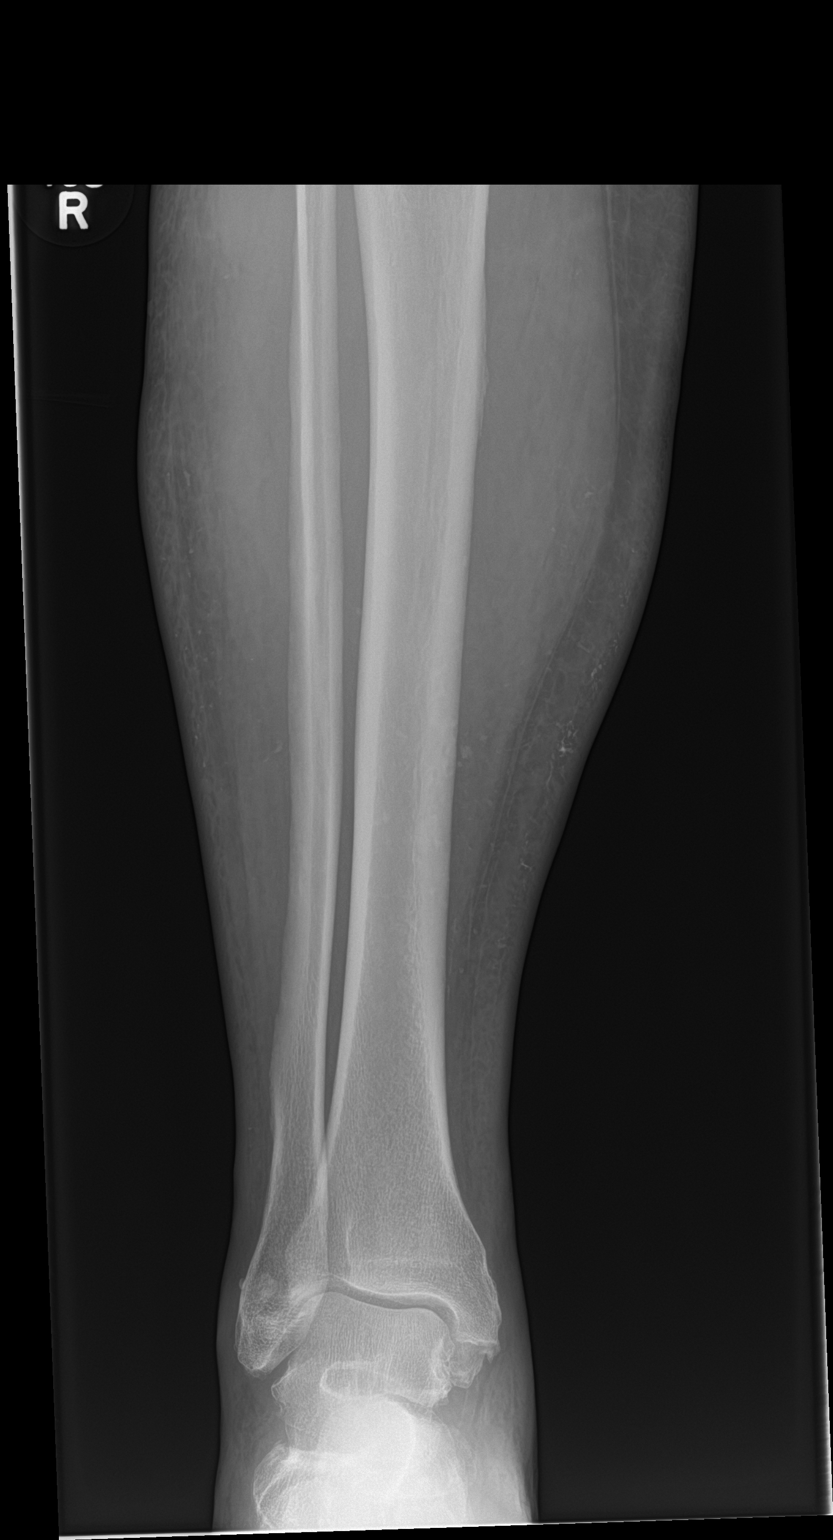
[im 3/4]
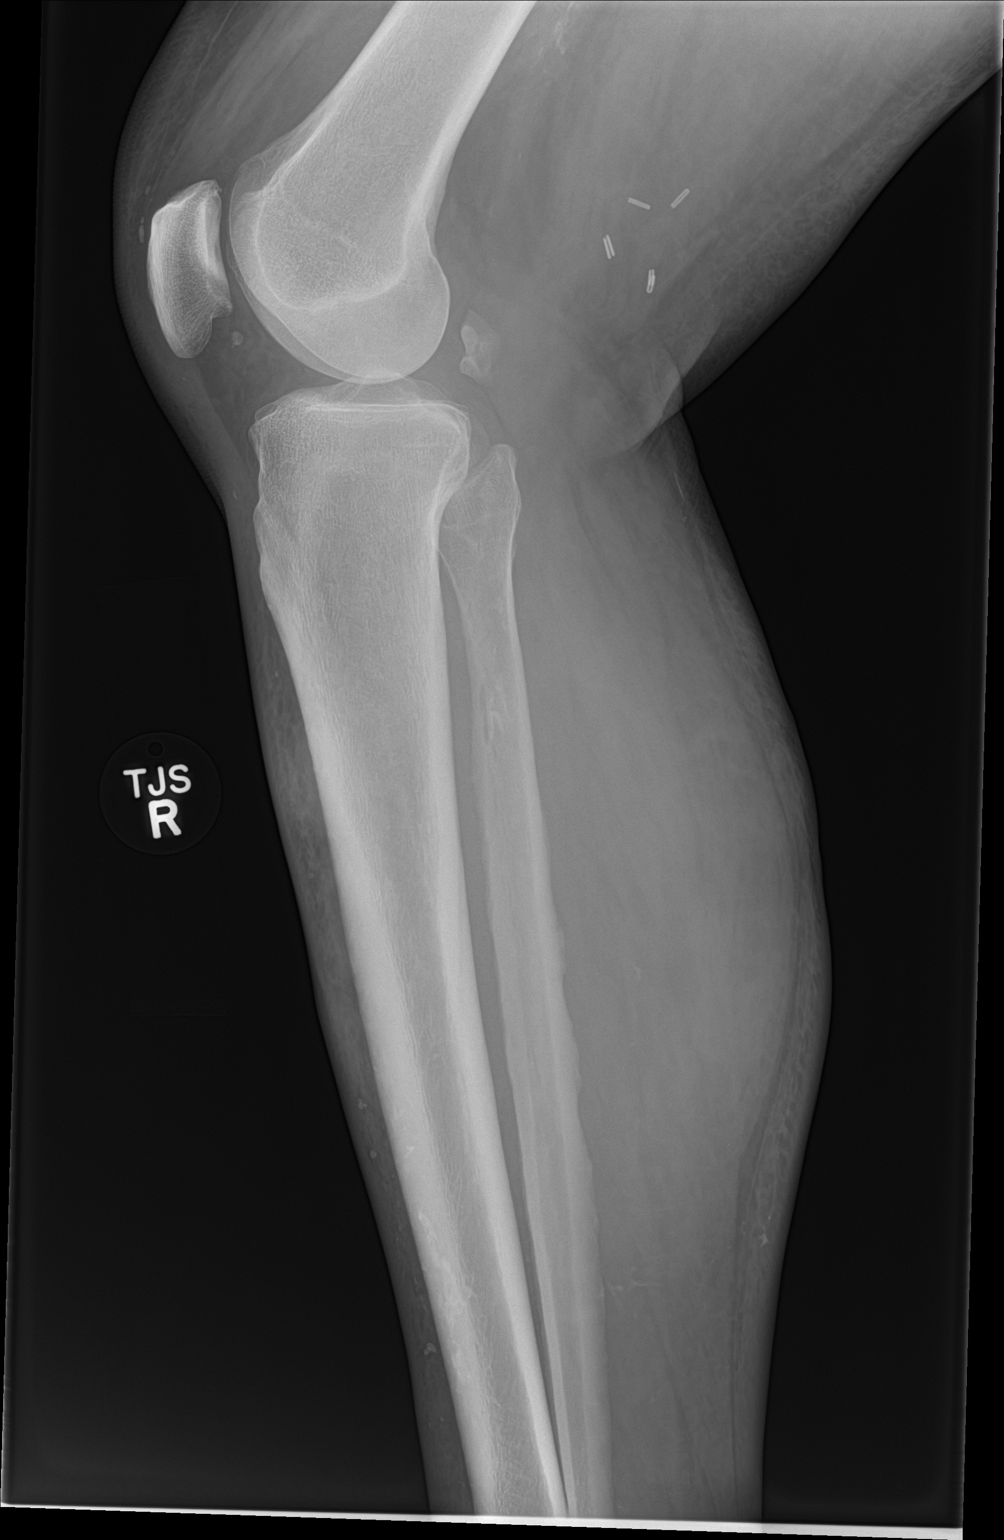
[im 4/4]
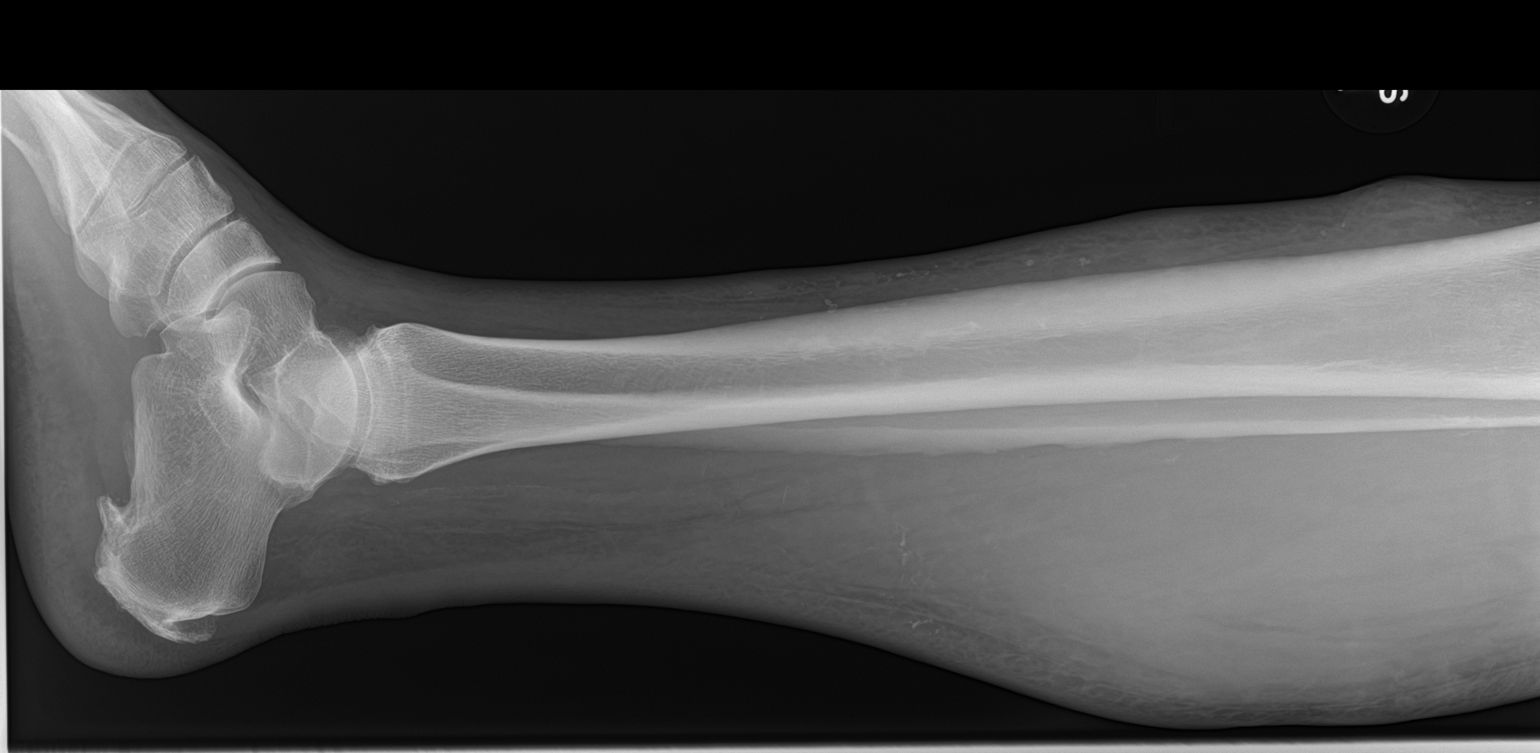

[4 of 4 positions shown; findings below may reference images not displayed]

FINDINGS: No evidence of acute fracture or dislocation of the right tibia or
fibula. Degenerative changes are present in the right ankle. Soft
tissue calcifications throughout the subcutaneous fat likely
representing phleboliths or dystrophic calcification. No radiopaque
soft tissue foreign bodies. No soft tissue gas collections. Surgical
clips in the popliteal fossa region.
IMPRESSION: No acute bony abnormalities. Soft tissue calcifications likely
phleboliths or dystrophic calcification.

## 2017-07-23 ENCOUNTER — Encounter (INDEPENDENT_AMBULATORY_CARE_PROVIDER_SITE_OTHER): Payer: Self-pay | Admitting: Vascular Surgery

## 2017-07-23 NOTE — Progress Notes (Signed)
MRN : [id number]  [name] is a 56 y.o. (04-09-1961) male who presents with chief complaint of  Chief Complaint  Patient presents with  . Follow-up    CT results  .  History of Present Illness: The patient is seen for follow up evaluation of carotid stenosis status post CT angiogram. CT scan was done 07/19/2017. Patient reports that the test went well with no problems or complications.   The patient denies interval amaurosis fugax. There is no recent or interval TIA symptoms or focal motor deficits. There is no prior documented CVA.  The patient is taking enteric-coated aspirin 81 mg daily.  There is no history of migraine headaches. There is no history of seizures.  The patient has a history of coronary artery disease, no recent episodes of angina or shortness of breath. The patient denies PAD or claudication symptoms. There is a history of hyperlipidemia which is being treated with a statin.    CT angiogram is reviewed by me personally and shows >47% RICA and >42% LICA stenosis.   Current Meds  Medication Sig  . allopurinol (ZYLOPRIM) 100 MG tablet Take 100 mg by mouth daily.  . cefUROXime (CEFTIN) 250 MG tablet TAKE 1 TABLET (250 MG TOTAL) BY MOUTH EVERY 12 (TWELVE) HOURS  . clopidogrel (PLAVIX) 75 MG tablet Take 1 tablet (75 mg total) by mouth daily.  . DULoxetine (CYMBALTA) 30 MG capsule TAKE 1 CAPSULE (30 MG TOTAL) BY MOUTH ONCE DAILY.  . furosemide (LASIX) 20 MG tablet Take 20 mg by mouth.  . gabapentin (NEURONTIN) 300 MG capsule Take 300 mg by mouth 3 (three) times daily.  Marland Kitchen glipiZIDE (GLUCOTROL XL) 10 MG 24 hr tablet Take 10 mg by mouth daily with breakfast.  . HYDROcodone-acetaminophen (NORCO/VICODIN) 5-325 MG tablet Take 1 tablet by mouth every 6 (six) hours as needed for moderate pain.  Marland Kitchen lisinopril (PRINIVIL,ZESTRIL) 2.5 MG tablet   . magnesium oxide (MAGOX 400) 400 (241.3 Mg) MG tablet Take by mouth.  . metFORMIN (GLUCOPHAGE) 1000 MG tablet Take 1,000 mg by  mouth 2 (two) times daily with a meal.  . pravastatin (PRAVACHOL) 40 MG tablet Take 40 mg by mouth daily.  Marland Kitchen rOPINIRole (REQUIP) 1 MG tablet Take by mouth.  . tamsulosin (FLOMAX) 0.4 MG CAPS capsule TAKE 1 CAPSULE (0.4 MG TOTAL) BY MOUTH ONCE DAILY. TAKE 30 MINUTES AFTER SAME MEAL EACH DAY.    Past Medical History:  Diagnosis Date  . Cancer ([place])    skin  . CHF (congestive heart failure) ([place])   . Diabetes mellitus without complication ([place])   . MI (myocardial infarction) [facility])     Past Surgical History:  Procedure Laterality Date  . PERIPHERAL VASCULAR CATHETERIZATION N/A 06/09/2015   Procedure: Abdominal Aortogram w/Lower Extremity;  Surgeon: Katha Cabal, MD;  Location: [place] [facility];  Service: Cardiovascular;  Laterality: N/A;  . PERIPHERAL VASCULAR CATHETERIZATION  06/09/2015   Procedure: Lower Extremity Intervention;  Surgeon: Katha Cabal, MD;  Location: [place] [facility];  Service: Cardiovascular;;    Social History Social History   Tobacco Use  . Smoking status: Former Research scientist (life sciences)  . Smokeless tobacco: Never Used  Substance Use Topics  . Alcohol use: No  . Drug use: Not on file    Family History History reviewed. No pertinent family history.  No Known Allergies   REVIEW OF SYSTEMS (Negative unless checked)  Constitutional: [] Weight loss  [] Fever  [] Chills Cardiac: [] Chest pain   [] Chest pressure   []   Palpitations   [] Shortness of breath when laying flat   [] Shortness of breath with exertion. Vascular:  [] Pain in legs with walking   [] Pain in legs at rest  [] History of DVT   [] Phlebitis   [] Swelling in legs   [] Varicose veins   [] Non-healing ulcers Pulmonary:   [] Uses home oxygen   [] Productive cough   [] Hemoptysis   [] Wheeze  [] COPD   [] Asthma Neurologic:  [] Dizziness   [] Seizures   [] History of stroke   [] History of TIA  [] Aphasia   [] Vissual changes   [] Weakness or numbness in arm   [] Weakness or numbness in leg Musculoskeletal:   [] Joint  swelling   [] Joint pain   [] Low back pain Hematologic:  [] Easy bruising  [] Easy bleeding   [] Hypercoagulable state   [] Anemic Gastrointestinal:  [] Diarrhea   [] Vomiting  [] Gastroesophageal reflux/heartburn   [] Difficulty swallowing. Genitourinary:  [] Chronic kidney disease   [] Difficult urination  [] Frequent urination   [] Blood in urine Skin:  [] Rashes   [] Ulcers  Psychological:  [] History of anxiety   []  History of major depression.  Physical Examination  Vitals:   07/20/17 1035  BP: 98/68  Pulse: 67  Resp: 17  Weight: 234 lb (106.1 kg)  Height: 5\' 7"  (1.702 m)   Body mass index is 36.65 kg/m. Gen: WD/WN, NAD Head: [place]/AT, No temporalis wasting.  Ear/Nose/Throat: Hearing grossly intact, nares w/o erythema or drainage Eyes: PER, EOMI, sclera nonicteric.  Neck: Supple, no large masses.   Pulmonary:  Good air movement, no audible wheezing bilaterally, no use of accessory muscles.  Cardiac: RRR, no JVD Vascular: bilateral carotid bruits Vessel Right Left  Radial Palpable Palpable  Ulnar Palpable Palpable  Brachial Palpable Palpable  Carotid Palpable Palpable  Gastrointestinal: Non-distended. No guarding/no peritoneal signs.  Musculoskeletal: M/S 5/5 throughout.  No deformity or atrophy.  Neurologic: CN 2-12 intact. Symmetrical.  Speech is fluent. Motor exam as listed above. Psychiatric: Judgment intact, Mood & affect appropriate for pt's clinical situation. Dermatologic: No rashes or ulcers noted.  No changes consistent with cellulitis. Lymph : No lichenification or skin changes of chronic lymphedema.  CBC Lab Results  Component Value Date   WBC 13.5 (H) 10/22/2013   HGB 12.7 (L) 10/22/2013   HCT 38.8 (L) 10/22/2013   MCV 90 10/22/2013   PLT 125 (L) 10/22/2013    BMET    Component Value Date/Time   NA 134 (L) 06/09/2015 0758   NA 134 (L) 10/22/2013 0507   K 4.7 06/09/2015 0758   K 4.5 10/22/2013 0507   CL 103 06/09/2015 0758   CL 101 10/22/2013 0507   CO2 25  06/09/2015 0758   CO2 28 10/22/2013 0507   GLUCOSE 197 (H) 06/09/2015 0758   GLUCOSE 300 (H) 10/22/2013 0507   BUN 26 (H) 06/09/2015 0758   BUN 32 (H) 10/22/2013 0507   CREATININE 1.10 07/19/2017 1331   CREATININE 1.18 10/22/2013 0507   CALCIUM 9.9 06/09/2015 0758   CALCIUM 8.0 (L) 10/22/2013 0507   GFRNONAA >60 06/09/2015 0758   GFRNONAA >60 10/22/2013 0507   GFRAA >60 06/09/2015 0758   GFRAA >60 10/22/2013 0507   Estimated Creatinine Clearance: 85 mL/min (by C-G formula based on SCr of 1.1 mg/dL).  COAG No results found for: INR, PROTIME  Radiology Ct Angio Neck W Or Wo Contrast  Result Date: 07/19/2017 CLINICAL DATA:  55 year old male with left face numbness and dizziness for several weeks. Atherosclerosis. Pulmonary fibrosis. EXAM: CT ANGIOGRAPHY NECK TECHNIQUE: Multidetector CT imaging of the  neck was performed using the standard protocol during bolus administration of intravenous contrast. Multiplanar CT image reconstructions and MIPs were obtained to evaluate the vascular anatomy. Carotid stenosis measurements (when applicable) are obtained utilizing NASCET criteria, using the distal internal carotid diameter as the denominator. CONTRAST:  22mL ISOVUE-370 IOPAMIDOL (ISOVUE-370) INJECTION 76% COMPARISON:  Chest CT 01/11/2017. FINDINGS: Skeleton: Prior sternotomy. Bulky endplate osteophytosis in the lower cervical spine, and lesser flowing endplate osteophytes in the visible upper thoracic spine, suggesting diffuse idiopathic skeletal hyperostosis. No acute osseous abnormality identified. Visible paranasal sinuses and mastoids are well pneumatized. Upper chest: Lower lung volumes. Stable visible lung parenchyma. No superior mediastinal lymphadenopathy. Other neck: Negative.  No cervical lymphadenopathy. Visible brain parenchyma appears normal for age. Aortic arch: Prior CABG. Moderate soft and calcified aortic arch and proximal great vessel atherosclerosis. Right carotid system: No  brachiocephalic or right CCA origin stenosis despite atherosclerosis. Soft and calcified plaque in the right CCA progresses toward the right carotid bifurcation. Bulky plaque at the bifurcation just proximal to the right ICA origin with stenosis up to 60% with respect to the right ICA caliber (series 7, image 155). No additional hemodynamically significant stenosis at the right ICA origin or bulb. Distal to the bulb the cervical right ICA is negative. The visible right ICA siphon is patent but with mild to moderate calcified plaque. Left carotid system: No left CCA origin stenosis despite atherosclerosis. Soft and calcified plaque in the distal left CCA be come severe toward the level of the left carotid bifurcation. Short ~ 4 mm segment of high-grade stenosis at the left ICA origin approaching a radiographic string sign (series 9 image 116) and series 7, image 158. Incidental high-grade stenosis of the left ECA origin also. Calcified plaque at the distal bulb but no additional proximal left ICA stenosis. The left ICA caliber does appear somewhat decreased in the neck. The vessel remains patent. There is soft and calcified plaque at the distal left ICA just below the skullbase without significant stenosis. Visible left ICA siphon is patent with moderate calcified plaque. Vertebral arteries: No significant stenosis of the proximal right subclavian artery despite plaque. Soft plaque at the right vertebral artery origin with mild to moderate stenosis suspected. The right vertebral artery is dominant and patent to the skullbase without additional stenosis. The right V4 segment is patent to the region vertebrobasilar junction without stenosis. Patent right PICA origin. Negative visible basilar artery. Fetal type left PCA origin suspected. No significant proximal left subclavian artery stenosis despite somewhat bulky soft and calcified plaque. Severe stenosis at the left vertebral artery origin (series 7, image 78), but  the vessel remains patent. The left vertebral artery is non dominant and the V1 and proximal V 2 segments are highly diminutive. There is additional calcified plaque in the left V 2 segment. The vessel remains patent to the skullbase. Normal left PICA origin. The distal left vertebral is diminutive beyond the PICA. Review of the MIP images confirms the above findings IMPRESSION: 1. Advanced atherosclerosis at the aortic arch and in the neck. 2. High-grade Left ICA origin atherosclerotic stenosis, approaching a RADIOGRAPHIC STRING SIGN over a 4 mm segment. The left ICA remains patent but has a somewhat decreased caliber. 3. Distal Right CCA atherosclerotic stenosis estimated at 60%, just proximal to the bifurcation. 4. At least moderate superimposed bilateral ICA siphon calcified plaque. The visible siphons are patent. 5. Dominant Right Vertebral Artery with mild to moderate origin stenosis. Severe non dominant Left Vertebral Artery origins stenosis.  No distal vertebral stenosis. 6. Prior CABG. Electronically Signed   By: Genevie Ann M.D.   On: 07/19/2017 14:26    Assessment/Plan 1. Bilateral carotid artery stenosis Recommend:  The patient remains asymptomatic with respect to the carotid stenosis.  However, the patient has now progressed and has a lesion the is >75%.  Patient's CT angiography of the carotid arteries confirms >75% left ICA stenosis.  The anatomical considerations support surgery over stenting.  This was discussed in detail with the patient.  The patient does indeed need surgery, therefore, cardiac clearance will be arranged. Once cleared the patient will be scheduled for surgery.  The risks, benefits and alternative therapies were reviewed in detail with the patient.  All questions were answered.  The patient agrees to proceed with surgery of the left carotid artery.  Continue antiplatelet therapy as prescribed. Continue management of CAD, HTN and Hyperlipidemia. Healthy heart diet,  encouraged exercise at least 4 times per week.    2. Type 2 diabetes mellitus with other circulatory complication, without long-term current use of insulin (HCC) Continue hypoglycemic medications as already ordered, these medications have been reviewed and there are no changes at this time.  Hgb A1C to be monitored as already arranged by primary service   3. Hyperlipidemia, unspecified hyperlipidemia type Continue statin as ordered and reviewed, no changes at this time     Hortencia Pilar, MD  07/23/2017 1:32 PM

## 2017-07-28 ENCOUNTER — Other Ambulatory Visit (INDEPENDENT_AMBULATORY_CARE_PROVIDER_SITE_OTHER): Payer: Self-pay | Admitting: Vascular Surgery

## 2017-07-28 ENCOUNTER — Encounter (INDEPENDENT_AMBULATORY_CARE_PROVIDER_SITE_OTHER): Payer: Self-pay

## 2017-07-31 ENCOUNTER — Encounter
Admission: RE | Admit: 2017-07-31 | Discharge: 2017-07-31 | Disposition: A | Discharge status: Home / Self Care | Payer: Medicare HMO | Source: Ambulatory Visit | Attending: Vascular Surgery | Admitting: Vascular Surgery

## 2017-07-31 ENCOUNTER — Other Ambulatory Visit: Payer: Self-pay

## 2017-07-31 DIAGNOSIS — R9431 Abnormal electrocardiogram [ECG] [EKG]: Secondary | ICD-10-CM | POA: Insufficient documentation

## 2017-07-31 DIAGNOSIS — Z01818 Encounter for other preprocedural examination: Secondary | ICD-10-CM | POA: Insufficient documentation

## 2017-07-31 DIAGNOSIS — Z01810 Encounter for preprocedural cardiovascular examination: Secondary | ICD-10-CM | POA: Diagnosis not present

## 2017-07-31 DIAGNOSIS — I447 Left bundle-branch block, unspecified: Secondary | ICD-10-CM | POA: Insufficient documentation

## 2017-07-31 HISTORY — DX: Atherosclerotic heart disease of native coronary artery without angina pectoris: I25.10

## 2017-07-31 HISTORY — DX: Chronic obstructive pulmonary disease, unspecified: J44.9

## 2017-07-31 LAB — APTT: aPTT: 26 seconds (ref 24–36)

## 2017-07-31 LAB — CBC WITH DIFFERENTIAL/PLATELET
Basophils Absolute: 0 10*3/uL (ref 0–0.1)
Basophils Relative: 0 %
Eosinophils Absolute: 0.2 10*3/uL (ref 0–0.7)
Eosinophils Relative: 3 %
HEMATOCRIT: 44.6 % (ref 40.0–52.0)
HEMOGLOBIN: 14.6 g/dL (ref 13.0–18.0)
LYMPHS ABS: 1.2 10*3/uL (ref 1.0–3.6)
LYMPHS PCT: 19 %
MCH: 29.7 pg (ref 26.0–34.0)
MCHC: 32.6 g/dL (ref 32.0–36.0)
MCV: 90.9 fL (ref 80.0–100.0)
Monocytes Absolute: 0.8 10*3/uL (ref 0.2–1.0)
Monocytes Relative: 12 %
NEUTROS PCT: 66 %
Neutro Abs: 4.2 10*3/uL (ref 1.4–6.5)
Platelets: 143 10*3/uL — ABNORMAL LOW (ref 150–440)
RBC: 4.91 MIL/uL (ref 4.40–5.90)
RDW: 13.6 % (ref 11.5–14.5)
WBC: 6.3 10*3/uL (ref 3.8–10.6)

## 2017-07-31 LAB — BASIC METABOLIC PANEL
Anion gap: 10 (ref 5–15)
BUN: 36 mg/dL — ABNORMAL HIGH (ref 6–20)
CHLORIDE: 96 mmol/L — AB (ref 101–111)
CO2: 29 mmol/L (ref 22–32)
Calcium: 9.5 mg/dL (ref 8.9–10.3)
Creatinine, Ser: 1.06 mg/dL (ref 0.61–1.24)
GFR calc non Af Amer: >60 mL/min (ref >60)
Glucose, Bld: 285 mg/dL — ABNORMAL HIGH (ref 65–99)
POTASSIUM: 4.6 mmol/L (ref 3.5–5.1)
SODIUM: 135 mmol/L (ref 135–145)

## 2017-07-31 LAB — TYPE AND SCREEN
ABO/RH(D): A POS
ANTIBODY SCREEN: NEGATIVE

## 2017-07-31 LAB — PROTIME-INR
INR: 0.87
Prothrombin Time: 11.7 seconds (ref 11.4–15.2)

## 2017-07-31 LAB — SURGICAL PCR SCREEN
MRSA, PCR: NEGATIVE
STAPHYLOCOCCUS AUREUS: NEGATIVE

## 2017-07-31 NOTE — Patient Instructions (Signed)
Your procedure is scheduled on:  Friday, August 11, 2017 Report to Same Day Surgery on the 2nd floor in the [facility]. To find out your arrival time, please call [phone] between 1PM - 3PM on: Thursday, August 10, 2017  REMEMBER: Instructions that are not followed completely may result in serious medical risk, up to and including death; or upon the discretion of your surgeon and anesthesiologist your surgery may need to be rescheduled.  Do not eat food after midnight the night before your procedure.  No gum chewing or hard candies.  You may however, drink water up to 2 hours before you are scheduled to arrive at the hospital for your procedure.  Do not drink water within 2 hours of the start of your surgery.  No Alcohol for 24 hours before or after surgery.  No Smoking including e-cigarettes for 24 hours prior to surgery. No chewable tobacco products for at least 6 hours prior to surgery. No nicotine patches on the day of surgery.  Notify your doctor if there is any change in your medical condition (cold, fever, infection).  Do not wear jewelry, make-up, hairpins, clips or nail polish.  Do not wear lotions, powders, or perfumes. You may wear deodorant.  Do not shave 48 hours prior to surgery. Men may shave face and neck.  Contacts and dentures may not be worn into surgery.  Do not bring valuables to the hospital. [facility] is not responsible for any belongings or valuables.  TAKE THESE MEDICATIONS THE MORNING OF SURGERY WITH A SIP OF WATER:  1.  CARVEDILOL 2.  SPIRIVA INHALER 3.  VENTOLIN INHALER  Use CHG Soap as directed on instruction sheet.  Use inhalers on the day of surgery and bring to the hospital.  Stop Metformin 2 days prior to surgery. (TAKE LAST DOSE ON January 15) - RESUME AFTER SURGERY  Take 1/2 of usual insulin dose the night before surgery and none on the morning of surgery. (TAKE 25 UNITS WITH SUPPER THE NIGHT BEFORE SURGERY)  Follow  recommendations from Cardiologist stopping Aspirin. CALL DR. FATH AND FOLLOW HIS DIRECTION.  ON January 10 - Stop Anti-inflammatories such as Advil, Aleve, Ibuprofen, Motrin, Naproxen, Naprosyn, Goodie powder, or aspirin products. (May take Tylenol or Acetaminophen if needed.)  ON January 10 - Stop ANY OVER THE COUNTER supplements until after surgery. (May continue Vitamin D, Vitamin B, and multivitamin.)  If you are being admitted to the hospital overnight, leave your suitcase in the car. After surgery it may be brought to your room.  Please call the number above if you have any questions about these instructions.

## 2017-08-01 NOTE — Pre-Procedure Instructions (Signed)
CLEARANCE BY DR Ubaldo Glassing 07/27/17 ON CHART.AS INSTRUCTED BY DR Amie Critchley, MEDICAL OPTIMIZATION RE DIABETES CALLED AND FAXED TO DR Ginette Pitman. ALSO NOTIFIED [place]. SPOKE WITH April AT DR [facility]

## 2017-08-10 ENCOUNTER — Telehealth (INDEPENDENT_AMBULATORY_CARE_PROVIDER_SITE_OTHER): Payer: Self-pay

## 2017-08-10 NOTE — Telephone Encounter (Signed)
Per Judeen Hammans at pre-admission testing the patient's surgery has to be canceled due to the PCP needing the patient to be seen by a Endocrinologist before his surgery and they are waiting on an appt for the patient to be seen. Patient was scheduled for a LCEA on 08/11/17.

## 2017-08-10 NOTE — Pre-Procedure Instructions (Signed)
SPOKE WITH CHARLENE AT DR HANDE'S. SHE STATES DR Ginette Pitman WANTS PATIENT OPTIMIZED BY ENDOCRINOLOGY PRIOR TO SURGERY. THEY ARE TRYING TO ARRANGE QUICKEST APPOINTMENT. lAURA AT DR [facility] NOTIFIED

## 2017-08-11 ENCOUNTER — Inpatient Hospital Stay: Admission: RE | Admit: 2017-08-11 | Payer: Medicare HMO | Source: Ambulatory Visit | Admitting: Vascular Surgery

## 2017-08-11 ENCOUNTER — Encounter: Admission: RE | Payer: Self-pay | Source: Ambulatory Visit

## 2017-08-11 SURGERY — ENDARTERECTOMY, CAROTID
Anesthesia: General | Laterality: Left

## 2017-08-17 ENCOUNTER — Other Ambulatory Visit
Admission: RE | Admit: 2017-08-17 | Discharge: 2017-08-17 | Disposition: A | Discharge status: Home / Self Care | Payer: Medicare HMO | Source: Ambulatory Visit | Attending: Internal Medicine | Admitting: Internal Medicine

## 2017-08-17 DIAGNOSIS — I6529 Occlusion and stenosis of unspecified carotid artery: Secondary | ICD-10-CM | POA: Insufficient documentation

## 2017-08-17 DIAGNOSIS — E1142 Type 2 diabetes mellitus with diabetic polyneuropathy: Secondary | ICD-10-CM | POA: Insufficient documentation

## 2017-08-17 DIAGNOSIS — E1159 Type 2 diabetes mellitus with other circulatory complications: Secondary | ICD-10-CM | POA: Diagnosis not present

## 2017-08-17 DIAGNOSIS — E1129 Type 2 diabetes mellitus with other diabetic kidney complication: Secondary | ICD-10-CM | POA: Diagnosis not present

## 2017-08-17 DIAGNOSIS — I429 Cardiomyopathy, unspecified: Secondary | ICD-10-CM | POA: Diagnosis not present

## 2017-08-17 DIAGNOSIS — E1165 Type 2 diabetes mellitus with hyperglycemia: Secondary | ICD-10-CM | POA: Diagnosis not present

## 2017-08-17 DIAGNOSIS — Z951 Presence of aortocoronary bypass graft: Secondary | ICD-10-CM | POA: Insufficient documentation

## 2017-08-17 DIAGNOSIS — I739 Peripheral vascular disease, unspecified: Secondary | ICD-10-CM | POA: Insufficient documentation

## 2017-08-17 DIAGNOSIS — Z01818 Encounter for other preprocedural examination: Secondary | ICD-10-CM | POA: Diagnosis not present

## 2017-08-17 DIAGNOSIS — I6523 Occlusion and stenosis of bilateral carotid arteries: Secondary | ICD-10-CM | POA: Diagnosis not present

## 2017-08-17 DIAGNOSIS — Z794 Long term (current) use of insulin: Secondary | ICD-10-CM | POA: Diagnosis not present

## 2017-08-17 DIAGNOSIS — R809 Proteinuria, unspecified: Secondary | ICD-10-CM | POA: Diagnosis not present

## 2017-08-17 LAB — COMPREHENSIVE METABOLIC PANEL
ALT: 20 U/L (ref 17–63)
AST: 21 U/L (ref 15–41)
Albumin: 3.7 g/dL (ref 3.5–5.0)
Alkaline Phosphatase: 44 U/L (ref 38–126)
Anion gap: 7 (ref 5–15)
BUN: 23 mg/dL — AB (ref 6–20)
CHLORIDE: 102 mmol/L (ref 101–111)
CO2: 26 mmol/L (ref 22–32)
CREATININE: 1.15 mg/dL (ref 0.61–1.24)
Calcium: 9 mg/dL (ref 8.9–10.3)
GFR calc Af Amer: >60 mL/min (ref >60)
GFR calc non Af Amer: >60 mL/min (ref >60)
Glucose, Bld: 102 mg/dL — ABNORMAL HIGH (ref 65–99)
POTASSIUM: 4.3 mmol/L (ref 3.5–5.1)
SODIUM: 135 mmol/L (ref 135–145)
Total Bilirubin: 0.6 mg/dL (ref 0.3–1.2)
Total Protein: 7.2 g/dL (ref 6.5–8.1)

## 2017-08-24 ENCOUNTER — Encounter (INDEPENDENT_AMBULATORY_CARE_PROVIDER_SITE_OTHER): Payer: Self-pay

## 2017-08-28 ENCOUNTER — Other Ambulatory Visit (INDEPENDENT_AMBULATORY_CARE_PROVIDER_SITE_OTHER): Payer: Self-pay | Admitting: Vascular Surgery

## 2017-08-28 NOTE — Pre-Procedure Instructions (Signed)
CLEARED BY ENDOCRINOLOGIST PER DR HANDE. 08/25/17

## 2017-09-05 NOTE — Pre-Procedure Instructions (Signed)
Dr. Ubaldo Glassing states low risk for surgery.

## 2017-09-07 MED ORDER — CEFAZOLIN SODIUM-DEXTROSE 2-4 GM/100ML-% IV SOLN
2.0000 g | INTRAVENOUS | Status: AC
  Administered 2017-09-08: 2 g via INTRAVENOUS

## 2017-09-08 ENCOUNTER — Inpatient Hospital Stay
Admission: RE | Admit: 2017-09-08 | Discharge: 2017-09-10 | DRG: 038 | Disposition: A | Discharge status: Home / Self Care | Payer: Medicare HMO | Source: Ambulatory Visit | Attending: Vascular Surgery | Admitting: Vascular Surgery

## 2017-09-08 ENCOUNTER — Encounter: Payer: Self-pay | Admitting: *Deleted

## 2017-09-08 ENCOUNTER — Encounter
Admission: RE | Disposition: A | Discharge status: Home / Self Care | Payer: Self-pay | Source: Ambulatory Visit | Attending: Vascular Surgery

## 2017-09-08 ENCOUNTER — Inpatient Hospital Stay: Payer: Medicare HMO | Admitting: Certified Registered"

## 2017-09-08 ENCOUNTER — Inpatient Hospital Stay: Payer: Medicare HMO

## 2017-09-08 ENCOUNTER — Other Ambulatory Visit: Payer: Self-pay

## 2017-09-08 DIAGNOSIS — I110 Hypertensive heart disease with heart failure: Secondary | ICD-10-CM | POA: Diagnosis present

## 2017-09-08 DIAGNOSIS — I252 Old myocardial infarction: Secondary | ICD-10-CM

## 2017-09-08 DIAGNOSIS — I420 Dilated cardiomyopathy: Secondary | ICD-10-CM | POA: Diagnosis not present

## 2017-09-08 DIAGNOSIS — J449 Chronic obstructive pulmonary disease, unspecified: Secondary | ICD-10-CM | POA: Diagnosis not present

## 2017-09-08 DIAGNOSIS — I2510 Atherosclerotic heart disease of native coronary artery without angina pectoris: Secondary | ICD-10-CM | POA: Diagnosis not present

## 2017-09-08 DIAGNOSIS — Z951 Presence of aortocoronary bypass graft: Secondary | ICD-10-CM | POA: Diagnosis not present

## 2017-09-08 DIAGNOSIS — I6522 Occlusion and stenosis of left carotid artery: Secondary | ICD-10-CM | POA: Diagnosis present

## 2017-09-08 DIAGNOSIS — I5022 Chronic systolic (congestive) heart failure: Secondary | ICD-10-CM | POA: Diagnosis not present

## 2017-09-08 DIAGNOSIS — Z87891 Personal history of nicotine dependence: Secondary | ICD-10-CM | POA: Diagnosis not present

## 2017-09-08 DIAGNOSIS — Z95820 Peripheral vascular angioplasty status with implants and grafts: Secondary | ICD-10-CM | POA: Diagnosis not present

## 2017-09-08 DIAGNOSIS — I959 Hypotension, unspecified: Secondary | ICD-10-CM | POA: Diagnosis not present

## 2017-09-08 DIAGNOSIS — N179 Acute kidney failure, unspecified: Secondary | ICD-10-CM | POA: Diagnosis not present

## 2017-09-08 DIAGNOSIS — E1151 Type 2 diabetes mellitus with diabetic peripheral angiopathy without gangrene: Secondary | ICD-10-CM | POA: Diagnosis present

## 2017-09-08 DIAGNOSIS — Z85828 Personal history of other malignant neoplasm of skin: Secondary | ICD-10-CM | POA: Diagnosis not present

## 2017-09-08 DIAGNOSIS — Z7982 Long term (current) use of aspirin: Secondary | ICD-10-CM | POA: Diagnosis not present

## 2017-09-08 DIAGNOSIS — E785 Hyperlipidemia, unspecified: Secondary | ICD-10-CM | POA: Diagnosis not present

## 2017-09-08 DIAGNOSIS — N990 Postprocedural (acute) (chronic) kidney failure: Secondary | ICD-10-CM | POA: Diagnosis not present

## 2017-09-08 DIAGNOSIS — E119 Type 2 diabetes mellitus without complications: Secondary | ICD-10-CM | POA: Diagnosis not present

## 2017-09-08 DIAGNOSIS — Z7984 Long term (current) use of oral hypoglycemic drugs: Secondary | ICD-10-CM | POA: Diagnosis not present

## 2017-09-08 DIAGNOSIS — I739 Peripheral vascular disease, unspecified: Secondary | ICD-10-CM | POA: Diagnosis present

## 2017-09-08 DIAGNOSIS — E782 Mixed hyperlipidemia: Secondary | ICD-10-CM | POA: Diagnosis present

## 2017-09-08 DIAGNOSIS — Z79899 Other long term (current) drug therapy: Secondary | ICD-10-CM | POA: Diagnosis not present

## 2017-09-08 DIAGNOSIS — R0902 Hypoxemia: Secondary | ICD-10-CM | POA: Diagnosis not present

## 2017-09-08 DIAGNOSIS — I9581 Postprocedural hypotension: Secondary | ICD-10-CM | POA: Diagnosis not present

## 2017-09-08 HISTORY — PX: ENDARTERECTOMY: SHX5162

## 2017-09-08 LAB — BLOOD GAS, ARTERIAL
Acid-base deficit: 8.1 mmol/L — ABNORMAL HIGH (ref 0.0–2.0)
Acid-base deficit: 6.3 mmol/L — ABNORMAL HIGH (ref 0.0–2.0)
Bicarbonate: 19.3 mmol/L — ABNORMAL LOW (ref 20.0–28.0)
Bicarbonate: 21 mmol/L (ref 20.0–28.0)
FIO2: 32
FIO2: 32
O2 SAT: 81.6 %
O2 SAT: 83.7 %
PATIENT TEMPERATURE: 37
PATIENT TEMPERATURE: 37
pCO2 arterial: 46 mmHg (ref 32.0–48.0)
pCO2 arterial: 48 mmHg (ref 32.0–48.0)
pH, Arterial: 7.23 — ABNORMAL LOW (ref 7.350–7.450)
pH, Arterial: 7.25 — ABNORMAL LOW (ref 7.350–7.450)
pO2, Arterial: 58 mmHg — ABNORMAL LOW (ref 83.0–108.0)
pO2, Arterial: 54 mmHg — ABNORMAL LOW (ref 83.0–108.0)

## 2017-09-08 LAB — BASIC METABOLIC PANEL
Anion gap: 6 (ref 5–15)
BUN: 23 mg/dL — AB (ref 6–20)
CALCIUM: 9 mg/dL (ref 8.9–10.3)
CO2: 24 mmol/L (ref 22–32)
CREATININE: 0.99 mg/dL (ref 0.61–1.24)
Chloride: 109 mmol/L (ref 101–111)
GFR calc Af Amer: >60 mL/min (ref >60)
GLUCOSE: 224 mg/dL — AB (ref 65–99)
POTASSIUM: 4.1 mmol/L (ref 3.5–5.1)
Sodium: 139 mmol/L (ref 135–145)

## 2017-09-08 LAB — MRSA PCR SCREENING: MRSA BY PCR: NEGATIVE

## 2017-09-08 LAB — CBC WITH DIFFERENTIAL/PLATELET
BASOS ABS: 0 10*3/uL (ref 0–0.1)
BASOS PCT: 0 %
EOS ABS: 0.1 10*3/uL (ref 0–0.7)
EOS PCT: 2 %
HCT: 35.9 % — ABNORMAL LOW (ref 40.0–52.0)
Hemoglobin: 11.9 g/dL — ABNORMAL LOW (ref 13.0–18.0)
LYMPHS PCT: 23 %
Lymphs Abs: 1.3 10*3/uL (ref 1.0–3.6)
MCH: 29.9 pg (ref 26.0–34.0)
MCHC: 33.1 g/dL (ref 32.0–36.0)
MCV: 90.3 fL (ref 80.0–100.0)
MONO ABS: 0.8 10*3/uL (ref 0.2–1.0)
Monocytes Relative: 14 %
Neutro Abs: 3.6 10*3/uL (ref 1.4–6.5)
Neutrophils Relative %: 61 %
PLATELETS: 146 10*3/uL — AB (ref 150–440)
RBC: 3.98 MIL/uL — AB (ref 4.40–5.90)
RDW: 13.8 % (ref 11.5–14.5)
WBC: 5.9 10*3/uL (ref 3.8–10.6)

## 2017-09-08 LAB — GLUCOSE, CAPILLARY
GLUCOSE-CAPILLARY: 284 mg/dL — AB (ref 65–99)
GLUCOSE-CAPILLARY: 318 mg/dL — AB (ref 65–99)
GLUCOSE-CAPILLARY: 264 mg/dL — AB (ref 65–99)
GLUCOSE-CAPILLARY: 161 mg/dL — AB (ref 65–99)
Glucose-Capillary: 212 mg/dL — ABNORMAL HIGH (ref 65–99)

## 2017-09-08 LAB — PROTIME-INR
INR: 1.04
PROTHROMBIN TIME: 13.5 s (ref 11.4–15.2)

## 2017-09-08 LAB — TYPE AND SCREEN
ABO/RH(D): A POS
ANTIBODY SCREEN: NEGATIVE

## 2017-09-08 LAB — APTT: APTT: 28 s (ref 24–36)

## 2017-09-08 SURGERY — ENDARTERECTOMY, CAROTID
Anesthesia: General | Laterality: Left | Wound class: Clean

## 2017-09-08 MED ORDER — IPRATROPIUM-ALBUTEROL 0.5-2.5 (3) MG/3ML IN SOLN
3.0000 mL | Freq: Once | RESPIRATORY_TRACT | Status: AC
  Administered 2017-09-08: 3 mL via RESPIRATORY_TRACT

## 2017-09-08 MED ORDER — LIDOCAINE HCL (PF) 1 % IJ SOLN
INTRAMUSCULAR | Status: AC
  Filled 2017-09-08: qty 30

## 2017-09-08 MED ORDER — PHENYLEPHRINE HCL 10 MG/ML IJ SOLN
INTRAMUSCULAR | Status: AC
  Filled 2017-09-08: qty 1

## 2017-09-08 MED ORDER — PANTOPRAZOLE SODIUM 40 MG IV SOLR
40.0000 mg | Freq: Every day | INTRAVENOUS | Status: DC
  Administered 2017-09-08: 40 mg via INTRAVENOUS
  Filled 2017-09-08: qty 40

## 2017-09-08 MED ORDER — EVICEL 5 ML EX KIT
PACK | CUTANEOUS | Status: DC | PRN
  Administered 2017-09-08: 1

## 2017-09-08 MED ORDER — SODIUM CHLORIDE 0.9 % IV SOLN
0.0000 ug/min | INTRAVENOUS | Status: DC
  Administered 2017-09-08: 20 ug/min via INTRAVENOUS
  Filled 2017-09-08: qty 10

## 2017-09-08 MED ORDER — SODIUM CHLORIDE 0.9 % IV SOLN
1.5000 g | Freq: Two times a day (BID) | INTRAVENOUS | Status: AC
  Administered 2017-09-08 – 2017-09-09 (×2): 1.5 g via INTRAVENOUS
  Filled 2017-09-08 (×2): qty 1.5

## 2017-09-08 MED ORDER — ASPIRIN EC 81 MG PO TBEC: 81.0000 mg | DELAYED_RELEASE_TABLET | Freq: Every day | ORAL | Status: DC

## 2017-09-08 MED ORDER — SUCCINYLCHOLINE CHLORIDE 20 MG/ML IJ SOLN
INTRAMUSCULAR | Status: DC | PRN
  Administered 2017-09-08: 100 mg via INTRAVENOUS

## 2017-09-08 MED ORDER — SODIUM CHLORIDE 0.9 % IV SOLN
INTRAVENOUS | Status: DC | PRN
  Administered 2017-09-08: 20 ug/min via INTRAVENOUS

## 2017-09-08 MED ORDER — FUROSEMIDE 10 MG/ML IJ SOLN
5.0000 mg | Freq: Once | INTRAMUSCULAR | Status: AC
  Administered 2017-09-08: 5 mg via INTRAVENOUS

## 2017-09-08 MED ORDER — LIDOCAINE HCL 1 % IJ SOLN
INTRAMUSCULAR | Status: DC | PRN
  Administered 2017-09-08: 10 mL

## 2017-09-08 MED ORDER — SODIUM CHLORIDE 0.9 % IV SOLN
INTRAVENOUS | Status: DC | PRN
  Administered 2017-09-08: 150 mL via INTRAMUSCULAR

## 2017-09-08 MED ORDER — DOCUSATE SODIUM 100 MG PO CAPS
100.0000 mg | ORAL_CAPSULE | Freq: Every day | ORAL | Status: DC
  Administered 2017-09-09 – 2017-09-10 (×2): 100 mg via ORAL
  Filled 2017-09-08 (×2): qty 1

## 2017-09-08 MED ORDER — VITAMIN B-12 1000 MCG PO TABS
1000.0000 ug | ORAL_TABLET | Freq: Every day | ORAL | Status: DC
  Administered 2017-09-08 – 2017-09-10 (×3): 1000 ug via ORAL
  Filled 2017-09-08 (×3): qty 1

## 2017-09-08 MED ORDER — INSULIN ASPART 100 UNIT/ML ~~LOC~~ SOLN
0.0000 [IU] | Freq: Three times a day (TID) | SUBCUTANEOUS | Status: DC
  Administered 2017-09-08: 8 [IU] via SUBCUTANEOUS
  Filled 2017-09-08: qty 1

## 2017-09-08 MED ORDER — HEPARIN SODIUM (PORCINE) 1000 UNIT/ML IJ SOLN
INTRAMUSCULAR | Status: AC
  Filled 2017-09-08: qty 1

## 2017-09-08 MED ORDER — PHENOL 1.4 % MT LIQD
1.0000 | OROMUCOSAL | Status: DC | PRN
  Filled 2017-09-08: qty 177

## 2017-09-08 MED ORDER — ONDANSETRON HCL 4 MG/2ML IJ SOLN: 4.0000 mg | Freq: Four times a day (QID) | INTRAMUSCULAR | Status: DC | PRN

## 2017-09-08 MED ORDER — EPHEDRINE SULFATE 50 MG/ML IJ SOLN
INTRAMUSCULAR | Status: DC | PRN
  Administered 2017-09-08: 25 mg via INTRAVENOUS
  Administered 2017-09-08: 50 mg via INTRAVENOUS
  Administered 2017-09-08: 20 mg via INTRAVENOUS

## 2017-09-08 MED ORDER — ONDANSETRON HCL 4 MG/2ML IJ SOLN
4.0000 mg | Freq: Once | INTRAMUSCULAR | Status: AC | PRN
  Administered 2017-09-08: 4 mg via INTRAVENOUS

## 2017-09-08 MED ORDER — HYDROCODONE-ACETAMINOPHEN 7.5-325 MG PO TABS
1.0000 | ORAL_TABLET | Freq: Every day | ORAL | Status: DC
Start: 2017-09-08 — End: 2017-09-08

## 2017-09-08 MED ORDER — ROPINIROLE HCL 1 MG PO TABS
1.0000 mg | ORAL_TABLET | Freq: Every day | ORAL | Status: DC
  Administered 2017-09-08 – 2017-09-09 (×2): 1 mg via ORAL
  Filled 2017-09-08: qty 1
  Filled 2017-09-08 (×2): qty 2

## 2017-09-08 MED ORDER — LIDOCAINE HCL 4 % MT SOLN
OROMUCOSAL | Status: DC | PRN
  Administered 2017-09-08: 4 mL via TOPICAL

## 2017-09-08 MED ORDER — ASPIRIN EC 81 MG PO TBEC
81.0000 mg | DELAYED_RELEASE_TABLET | Freq: Every day | ORAL | Status: DC
  Administered 2017-09-08 – 2017-09-10 (×3): 81 mg via ORAL
  Filled 2017-09-08 (×3): qty 1

## 2017-09-08 MED ORDER — TIOTROPIUM BROMIDE MONOHYDRATE 18 MCG IN CAPS
18.0000 ug | ORAL_CAPSULE | Freq: Every day | RESPIRATORY_TRACT | Status: DC
Start: 2017-09-09 — End: 2017-09-10
  Administered 2017-09-09 – 2017-09-10 (×2): 18 ug via RESPIRATORY_TRACT
  Filled 2017-09-08: qty 5

## 2017-09-08 MED ORDER — ROCURONIUM BROMIDE 100 MG/10ML IV SOLN
INTRAVENOUS | Status: DC | PRN
  Administered 2017-09-08: 10 mg via INTRAVENOUS
  Administered 2017-09-08: 5 mg via INTRAVENOUS
  Administered 2017-09-08 (×4): 10 mg via INTRAVENOUS
  Administered 2017-09-08: 45 mg via INTRAVENOUS

## 2017-09-08 MED ORDER — FENTANYL CITRATE (PF) 100 MCG/2ML IJ SOLN
INTRAMUSCULAR | Status: DC | PRN
  Administered 2017-09-08: 100 ug via INTRAVENOUS
  Administered 2017-09-08: 50 ug via INTRAVENOUS

## 2017-09-08 MED ORDER — TAMSULOSIN HCL 0.4 MG PO CAPS
0.4000 mg | ORAL_CAPSULE | Freq: Every day | ORAL | Status: DC
  Administered 2017-09-08 – 2017-09-10 (×3): 0.4 mg via ORAL
  Filled 2017-09-08 (×3): qty 1

## 2017-09-08 MED ORDER — METOPROLOL TARTRATE 5 MG/5ML IV SOLN: 2.0000 mg | INTRAVENOUS | Status: DC | PRN

## 2017-09-08 MED ORDER — SODIUM CHLORIDE 0.9 % IV SOLN
0.0000 ug/min | INTRAVENOUS | Status: DC
  Administered 2017-09-08: 20 ug/min via INTRAVENOUS
  Administered 2017-09-08: 45 ug/min via INTRAVENOUS
  Administered 2017-09-08: 30 ug/min via INTRAVENOUS
  Administered 2017-09-09: 50 ug/min via INTRAVENOUS
  Administered 2017-09-09: 60 ug/min via INTRAVENOUS
  Administered 2017-09-09: 35 ug/min via INTRAVENOUS
  Administered 2017-09-09: 50 ug/min via INTRAVENOUS
  Administered 2017-09-09: 40 ug/min via INTRAVENOUS
  Filled 2017-09-08 (×4): qty 10
  Filled 2017-09-08 (×4): qty 1
  Filled 2017-09-08 (×2): qty 10

## 2017-09-08 MED ORDER — ONDANSETRON HCL 4 MG/2ML IJ SOLN
INTRAMUSCULAR | Status: DC | PRN
  Administered 2017-09-08: 4 mg via INTRAVENOUS

## 2017-09-08 MED ORDER — MAGNESIUM OXIDE 400 (241.3 MG) MG PO TABS
400.0000 mg | ORAL_TABLET | Freq: Every day | ORAL | Status: DC
  Administered 2017-09-09 – 2017-09-10 (×2): 400 mg via ORAL
  Filled 2017-09-08 (×2): qty 1

## 2017-09-08 MED ORDER — VASOPRESSIN 20 UNIT/ML IV SOLN
INTRAVENOUS | Status: DC | PRN
  Administered 2017-09-08: 3 [IU] via INTRAVENOUS
  Administered 2017-09-08: 1 [IU] via INTRAVENOUS
  Administered 2017-09-08: 3 [IU] via INTRAVENOUS

## 2017-09-08 MED ORDER — FENTANYL CITRATE (PF) 100 MCG/2ML IJ SOLN
INTRAMUSCULAR | Status: AC
  Filled 2017-09-08: qty 2

## 2017-09-08 MED ORDER — MORPHINE SULFATE (PF) 2 MG/ML IV SOLN
2.0000 mg | INTRAVENOUS | Status: DC | PRN
  Administered 2017-09-08 (×2): 2 mg via INTRAVENOUS
  Administered 2017-09-09: 4 mg via INTRAVENOUS
  Filled 2017-09-08: qty 2
  Filled 2017-09-08 (×2): qty 1

## 2017-09-08 MED ORDER — LIDOCAINE HCL (PF) 2 % IJ SOLN
INTRAMUSCULAR | Status: AC
  Filled 2017-09-08: qty 10

## 2017-09-08 MED ORDER — IPRATROPIUM-ALBUTEROL 0.5-2.5 (3) MG/3ML IN SOLN
RESPIRATORY_TRACT | Status: AC
  Administered 2017-09-08: 3 mL via RESPIRATORY_TRACT
  Filled 2017-09-08: qty 3

## 2017-09-08 MED ORDER — SODIUM CHLORIDE FLUSH 0.9 % IV SOLN
INTRAVENOUS | Status: AC
  Administered 2017-09-08: 10 mL
  Filled 2017-09-08: qty 10

## 2017-09-08 MED ORDER — LIDOCAINE HCL (CARDIAC) 20 MG/ML IV SOLN
INTRAVENOUS | Status: DC | PRN
  Administered 2017-09-08: 100 mg via INTRAVENOUS

## 2017-09-08 MED ORDER — CHLORHEXIDINE GLUCONATE CLOTH 2 % EX PADS: 6.0000 | MEDICATED_PAD | Freq: Once | CUTANEOUS | Status: DC

## 2017-09-08 MED ORDER — FUROSEMIDE 10 MG/ML IJ SOLN
10.0000 mg | Freq: Once | INTRAMUSCULAR | Status: AC
Start: 2017-09-08 — End: 2017-09-08
  Administered 2017-09-08: 10 mg via INTRAVENOUS

## 2017-09-08 MED ORDER — PHENYLEPHRINE HCL 10 MG/ML IJ SOLN
INTRAMUSCULAR | Status: DC | PRN
  Administered 2017-09-08 (×2): 100 ug via INTRAVENOUS
  Administered 2017-09-08 (×3): 200 ug via INTRAVENOUS
  Administered 2017-09-08: 100 ug via INTRAVENOUS
  Administered 2017-09-08 (×3): 200 ug via INTRAVENOUS
  Administered 2017-09-08: 100 ug via INTRAVENOUS
  Administered 2017-09-08 (×2): 200 ug via INTRAVENOUS
  Administered 2017-09-08 (×2): 100 ug via INTRAVENOUS
  Administered 2017-09-08 (×2): 200 ug via INTRAVENOUS
  Administered 2017-09-08: 100 ug via INTRAVENOUS

## 2017-09-08 MED ORDER — PROPOFOL 10 MG/ML IV BOLUS
INTRAVENOUS | Status: DC | PRN
  Administered 2017-09-08: 160 mg via INTRAVENOUS

## 2017-09-08 MED ORDER — POTASSIUM CHLORIDE IN NACL 20-0.9 MEQ/L-% IV SOLN
INTRAVENOUS | Status: AC
  Administered 2017-09-08: 15:00:00 via INTRAVENOUS
  Filled 2017-09-08 (×2): qty 1000

## 2017-09-08 MED ORDER — MIDAZOLAM HCL 2 MG/2ML IJ SOLN
INTRAMUSCULAR | Status: DC | PRN
  Administered 2017-09-08: 2 mg via INTRAVENOUS

## 2017-09-08 MED ORDER — MENTHOL 3 MG MT LOZG
1.0000 | LOZENGE | OROMUCOSAL | Status: DC | PRN
  Filled 2017-09-08: qty 9

## 2017-09-08 MED ORDER — SODIUM CHLORIDE 0.9 % IJ SOLN
INTRAMUSCULAR | Status: AC
  Filled 2017-09-08: qty 10

## 2017-09-08 MED ORDER — NITROGLYCERIN IN D5W 200-5 MCG/ML-% IV SOLN
INTRAVENOUS | Status: AC
  Filled 2017-09-08: qty 250

## 2017-09-08 MED ORDER — HYDRALAZINE HCL 20 MG/ML IJ SOLN: 5.0000 mg | INTRAMUSCULAR | Status: DC | PRN

## 2017-09-08 MED ORDER — ALBUTEROL SULFATE HFA 108 (90 BASE) MCG/ACT IN AERS: 2.0000 | INHALATION_SPRAY | Freq: Four times a day (QID) | RESPIRATORY_TRACT | Status: DC | PRN

## 2017-09-08 MED ORDER — DOPAMINE-DEXTROSE 3.2-5 MG/ML-% IV SOLN
2.0000 ug/kg/min | INTRAVENOUS | Status: DC
  Administered 2017-09-08: 5 ug/kg/min via INTRAVENOUS

## 2017-09-08 MED ORDER — METOPROLOL TARTRATE 5 MG/5ML IV SOLN
INTRAVENOUS | Status: DC | PRN
  Administered 2017-09-08: 1 mg via INTRAVENOUS

## 2017-09-08 MED ORDER — LIRAGLUTIDE 18 MG/3ML ~~LOC~~ SOPN: 0.9000 mg | PEN_INJECTOR | Freq: Every day | SUBCUTANEOUS | Status: DC

## 2017-09-08 MED ORDER — SUGAMMADEX SODIUM 500 MG/5ML IV SOLN
INTRAVENOUS | Status: AC
  Filled 2017-09-08: qty 5

## 2017-09-08 MED ORDER — ONDANSETRON HCL 4 MG/2ML IJ SOLN
INTRAMUSCULAR | Status: AC
  Filled 2017-09-08: qty 2

## 2017-09-08 MED ORDER — FUROSEMIDE 40 MG PO TABS
40.0000 mg | ORAL_TABLET | Freq: Every day | ORAL | Status: DC
  Administered 2017-09-08 – 2017-09-10 (×3): 40 mg via ORAL
  Filled 2017-09-08: qty 2
  Filled 2017-09-08 (×3): qty 1

## 2017-09-08 MED ORDER — CEFAZOLIN SODIUM-DEXTROSE 2-4 GM/100ML-% IV SOLN
INTRAVENOUS | Status: AC
  Filled 2017-09-08: qty 100

## 2017-09-08 MED ORDER — PROPOFOL 10 MG/ML IV BOLUS
INTRAVENOUS | Status: AC
  Filled 2017-09-08: qty 20

## 2017-09-08 MED ORDER — FENTANYL CITRATE (PF) 100 MCG/2ML IJ SOLN: 25.0000 ug | INTRAMUSCULAR | Status: DC | PRN

## 2017-09-08 MED ORDER — HEPARIN SODIUM (PORCINE) 5000 UNIT/ML IJ SOLN
INTRAMUSCULAR | Status: AC
  Filled 2017-09-08: qty 1

## 2017-09-08 MED ORDER — OXYCODONE HCL 5 MG PO TABS
5.0000 mg | ORAL_TABLET | ORAL | Status: DC | PRN
  Administered 2017-09-08 (×2): 5 mg via ORAL
  Administered 2017-09-09 – 2017-09-10 (×5): 10 mg via ORAL
  Filled 2017-09-08: qty 1
  Filled 2017-09-08 (×5): qty 2
  Filled 2017-09-08: qty 1

## 2017-09-08 MED ORDER — ACETAMINOPHEN 650 MG RE SUPP: 325.0000 mg | RECTAL | Status: DC | PRN

## 2017-09-08 MED ORDER — VASOPRESSIN 20 UNIT/ML IV SOLN
INTRAVENOUS | Status: AC
  Filled 2017-09-08: qty 1

## 2017-09-08 MED ORDER — CARVEDILOL 12.5 MG PO TABS
6.2500 mg | ORAL_TABLET | Freq: Two times a day (BID) | ORAL | Status: DC
  Administered 2017-09-08 – 2017-09-09 (×2): 6.25 mg via ORAL
  Filled 2017-09-08 (×2): qty 1

## 2017-09-08 MED ORDER — EPINEPHRINE PF 1 MG/10ML IJ SOSY
PREFILLED_SYRINGE | INTRAMUSCULAR | Status: DC | PRN
  Administered 2017-09-08 (×3): 10 ug via INTRAVENOUS
  Administered 2017-09-08: 20 ug via INTRAVENOUS
  Administered 2017-09-08: 40 ug via INTRAVENOUS
  Administered 2017-09-08: 50 ug via INTRAVENOUS
  Administered 2017-09-08: 10 ug via INTRAVENOUS
  Administered 2017-09-08: 20 ug via INTRAVENOUS

## 2017-09-08 MED ORDER — ROCURONIUM BROMIDE 50 MG/5ML IV SOLN
INTRAVENOUS | Status: AC
  Filled 2017-09-08: qty 1

## 2017-09-08 MED ORDER — SUGAMMADEX SODIUM 500 MG/5ML IV SOLN
INTRAVENOUS | Status: DC | PRN
  Administered 2017-09-08: 230 mg via INTRAVENOUS

## 2017-09-08 MED ORDER — SODIUM CHLORIDE 0.9 % IV SOLN
INTRAVENOUS | Status: DC | PRN
  Administered 2017-09-08: 07:00:00 via INTRAVENOUS

## 2017-09-08 MED ORDER — LISINOPRIL 5 MG PO TABS: 2.5000 mg | ORAL_TABLET | Freq: Every day | ORAL | Status: DC

## 2017-09-08 MED ORDER — PROMETHAZINE HCL 25 MG/ML IJ SOLN
INTRAMUSCULAR | Status: AC
  Administered 2017-09-08: 6.25 mg via INTRAVENOUS
  Filled 2017-09-08: qty 1

## 2017-09-08 MED ORDER — HEPARIN SODIUM (PORCINE) 1000 UNIT/ML IJ SOLN
INTRAMUSCULAR | Status: DC | PRN
  Administered 2017-09-08: 7000 [IU] via INTRAVENOUS

## 2017-09-08 MED ORDER — PRAVASTATIN SODIUM 40 MG PO TABS
40.0000 mg | ORAL_TABLET | Freq: Every day | ORAL | Status: DC
  Administered 2017-09-08 – 2017-09-10 (×3): 40 mg via ORAL
  Filled 2017-09-08 (×2): qty 1
  Filled 2017-09-08: qty 2
  Filled 2017-09-08: qty 1

## 2017-09-08 MED ORDER — ALLOPURINOL 100 MG PO TABS
100.0000 mg | ORAL_TABLET | Freq: Every day | ORAL | Status: DC
  Administered 2017-09-08 – 2017-09-10 (×3): 100 mg via ORAL
  Filled 2017-09-08 (×3): qty 1

## 2017-09-08 MED ORDER — FAMOTIDINE 20 MG PO TABS
ORAL_TABLET | ORAL | Status: AC
  Administered 2017-09-08: 20 mg
  Filled 2017-09-08: qty 1

## 2017-09-08 MED ORDER — ACETAMINOPHEN 325 MG PO TABS
325.0000 mg | ORAL_TABLET | ORAL | Status: DC | PRN
  Administered 2017-09-08: 650 mg via ORAL
  Filled 2017-09-08: qty 2

## 2017-09-08 MED ORDER — FUROSEMIDE 10 MG/ML IJ SOLN
INTRAMUSCULAR | Status: AC
  Administered 2017-09-08: 5 mg via INTRAVENOUS
  Filled 2017-09-08: qty 2

## 2017-09-08 MED ORDER — GUAIFENESIN-DM 100-10 MG/5ML PO SYRP
15.0000 mL | ORAL_SOLUTION | ORAL | Status: DC | PRN
  Filled 2017-09-08: qty 15

## 2017-09-08 MED ORDER — ALUM & MAG HYDROXIDE-SIMETH 200-200-20 MG/5ML PO SUSP
15.0000 mL | ORAL | Status: DC | PRN
  Filled 2017-09-08: qty 30

## 2017-09-08 MED ORDER — DOPAMINE-DEXTROSE 3.2-5 MG/ML-% IV SOLN
INTRAVENOUS | Status: AC
  Administered 2017-09-08: 5 ug/kg/min via INTRAVENOUS
  Filled 2017-09-08: qty 250

## 2017-09-08 MED ORDER — SUCCINYLCHOLINE CHLORIDE 20 MG/ML IJ SOLN
INTRAMUSCULAR | Status: AC
  Filled 2017-09-08: qty 1

## 2017-09-08 MED ORDER — ALBUTEROL SULFATE (2.5 MG/3ML) 0.083% IN NEBU: 2.5000 mg | INHALATION_SOLUTION | Freq: Four times a day (QID) | RESPIRATORY_TRACT | Status: DC | PRN

## 2017-09-08 MED ORDER — ALBUMIN HUMAN 5 % IV SOLN
INTRAVENOUS | Status: AC
  Filled 2017-09-08: qty 250

## 2017-09-08 MED ORDER — LABETALOL HCL 5 MG/ML IV SOLN: 10.0000 mg | INTRAVENOUS | Status: DC | PRN

## 2017-09-08 MED ORDER — METFORMIN HCL 500 MG PO TABS
1000.0000 mg | ORAL_TABLET | Freq: Two times a day (BID) | ORAL | Status: DC
  Administered 2017-09-08 – 2017-09-10 (×4): 1000 mg via ORAL
  Filled 2017-09-08 (×5): qty 2

## 2017-09-08 MED ORDER — MIDAZOLAM HCL 2 MG/2ML IJ SOLN
INTRAMUSCULAR | Status: AC
  Filled 2017-09-08: qty 2

## 2017-09-08 MED ORDER — ALBUMIN HUMAN 5 % IV SOLN
INTRAVENOUS | Status: DC | PRN
  Administered 2017-09-08 (×2): via INTRAVENOUS

## 2017-09-08 MED ORDER — SODIUM CHLORIDE 0.9 % IV SOLN
500.0000 mL | Freq: Once | INTRAVENOUS | Status: AC | PRN
  Administered 2017-09-09: 500 mL via INTRAVENOUS

## 2017-09-08 MED ORDER — DOPAMINE-DEXTROSE 3.2-5 MG/ML-% IV SOLN
INTRAVENOUS | Status: DC | PRN
  Administered 2017-09-08: 5 ug/kg/min via INTRAVENOUS

## 2017-09-08 MED ORDER — PROMETHAZINE HCL 25 MG/ML IJ SOLN
6.2500 mg | Freq: Once | INTRAMUSCULAR | Status: AC
  Administered 2017-09-08: 6.25 mg via INTRAVENOUS

## 2017-09-08 MED ORDER — SODIUM CHLORIDE 0.9 % IV SOLN
0.0000 ug/min | INTRAVENOUS | Status: DC
  Administered 2017-09-08: 100 ug/min via INTRAVENOUS
  Filled 2017-09-08: qty 40

## 2017-09-08 SURGICAL SUPPLY — 66 items
APPLIER CLIP 11 MED OPEN (CLIP)
APPLIER CLIP 9.375 SM OPEN (CLIP)
BAG DECANTER FOR FLEXI CONT (MISCELLANEOUS) ×3 IMPLANT
BLADE SURG 15 STRL LF DISP TIS (BLADE) ×1 IMPLANT
BLADE SURG 15 STRL SS (BLADE) ×2
BLADE SURG SZ11 CARB STEEL (BLADE) ×3 IMPLANT
BOOT SUTURE AID YELLOW STND (SUTURE) ×6 IMPLANT
BRUSH SCRUB EZ  4% CHG (MISCELLANEOUS) ×2
BRUSH SCRUB EZ 4% CHG (MISCELLANEOUS) ×1 IMPLANT
CANISTER SUCT 1200ML W/VALVE (MISCELLANEOUS) ×3 IMPLANT
CLIP APPLIE 11 MED OPEN (CLIP) IMPLANT
CLIP APPLIE 9.375 SM OPEN (CLIP) IMPLANT
DERMABOND ADVANCED (GAUZE/BANDAGES/DRESSINGS) ×2
DERMABOND ADVANCED .7 DNX12 (GAUZE/BANDAGES/DRESSINGS) ×1 IMPLANT
DRAPE INCISE IOBAN 66X45 STRL (DRAPES) ×3 IMPLANT
DRAPE LAPAROTOMY 100X77 ABD (DRAPES) ×3 IMPLANT
DRAPE SHEET LG 3/4 BI-LAMINATE (DRAPES) ×3 IMPLANT
DRESSING SURGICEL FIBRLLR 1X2 (HEMOSTASIS) ×1 IMPLANT
DRSG SURGICEL FIBRILLAR 1X2 (HEMOSTASIS) ×3
DRSG TELFA 3X8 NADH (GAUZE/BANDAGES/DRESSINGS) ×3 IMPLANT
DURAPREP 26ML APPLICATOR (WOUND CARE) ×3 IMPLANT
ELECT CAUTERY BLADE 6.4 (BLADE) ×3 IMPLANT
ELECT REM PT RETURN 9FT ADLT (ELECTROSURGICAL) ×3
ELECTRODE REM PT RTRN 9FT ADLT (ELECTROSURGICAL) ×1 IMPLANT
GLOVE BIO SURGEON STRL SZ7 (GLOVE) ×15 IMPLANT
GLOVE INDICATOR 7.5 STRL GRN (GLOVE) ×15 IMPLANT
GLOVE SURG SYN 8.0 (GLOVE) ×6 IMPLANT
GOWN STRL REUS W/ TWL LRG LVL3 (GOWN DISPOSABLE) ×3 IMPLANT
GOWN STRL REUS W/ TWL XL LVL3 (GOWN DISPOSABLE) ×2 IMPLANT
GOWN STRL REUS W/TWL LRG LVL3 (GOWN DISPOSABLE) ×6
GOWN STRL REUS W/TWL XL LVL3 (GOWN DISPOSABLE) ×4
HOLDER FOLEY CATH W/STRAP (MISCELLANEOUS) ×3 IMPLANT
IV NS 500ML (IV SOLUTION) ×2
IV NS 500ML BAXH (IV SOLUTION) ×1 IMPLANT
KIT TURNOVER KIT A (KITS) ×3 IMPLANT
LABEL OR SOLS (LABEL) ×3 IMPLANT
LOOP RED MAXI  1X406MM (MISCELLANEOUS) ×4
LOOP VESSEL MAXI 1X406 RED (MISCELLANEOUS) ×2 IMPLANT
LOOP VESSEL MINI 0.8X406 BLUE (MISCELLANEOUS) ×1 IMPLANT
LOOPS BLUE MINI 0.8X406MM (MISCELLANEOUS) ×2
NEEDLE FILTER BLUNT 18X 1/2SAF (NEEDLE) ×2
NEEDLE FILTER BLUNT 18X1 1/2 (NEEDLE) ×1 IMPLANT
NEEDLE HYPO 25X1 1.5 SAFETY (NEEDLE) ×3 IMPLANT
NS IRRIG 500ML POUR BTL (IV SOLUTION) ×3 IMPLANT
PACK BASIN MAJOR ARMC (MISCELLANEOUS) ×3 IMPLANT
PATCH CAROTID ECM VASC 1X10 (Prosthesis & Implant Heart) ×3 IMPLANT
PENCIL ELECTRO HAND CTR (MISCELLANEOUS) IMPLANT
SHUNT CAROTID STR REINF 3.0X4. (MISCELLANEOUS) ×3 IMPLANT
SUT MNCRL+ 5-0 UNDYED PC-3 (SUTURE) ×1 IMPLANT
SUT MONOCRYL 5-0 (SUTURE) ×2
SUT PROLENE 6 0 BV (SUTURE) ×24 IMPLANT
SUT PROLENE 7 0 BV 1 (SUTURE) ×21 IMPLANT
SUT SILK 2 0 (SUTURE) ×2
SUT SILK 2-0 18XBRD TIE 12 (SUTURE) ×1 IMPLANT
SUT SILK 3 0 (SUTURE) ×4
SUT SILK 3-0 18XBRD TIE 12 (SUTURE) ×2 IMPLANT
SUT SILK 4 0 (SUTURE) ×2
SUT SILK 4-0 18XBRD TIE 12 (SUTURE) ×1 IMPLANT
SUT VIC AB 3-0 SH 27 (SUTURE) ×6
SUT VIC AB 3-0 SH 27X BRD (SUTURE) ×3 IMPLANT
SYR 10ML LL (SYRINGE) ×3 IMPLANT
SYR 20CC LL (SYRINGE) ×3 IMPLANT
SYR 3ML LL SCALE MARK (SYRINGE) ×3 IMPLANT
TRAY FOLEY W/METER SILVER 16FR (SET/KITS/TRAYS/PACK) ×3 IMPLANT
TUBING CONNECTING 10 (TUBING) IMPLANT
TUBING CONNECTING 10' (TUBING)

## 2017-09-08 NOTE — Anesthesia Procedure Notes (Signed)
Procedure Name: Intubation Performed by: Lance Muss, CRNA Pre-anesthesia Checklist: Patient identified, Patient being monitored, Timeout performed, Emergency Drugs available and Suction available Patient Re-evaluated:Patient Re-evaluated prior to induction Oxygen Delivery Method: Circle system utilized Preoxygenation: Pre-oxygenation with 100% oxygen Induction Type: IV induction Ventilation: Mask ventilation without difficulty Laryngoscope Size: McGraph and 4 Grade View: Grade II Tube type: Oral Tube size: 7.5 mm Number of attempts: 1 Airway Equipment and Method: Stylet,  Video-laryngoscopy and LTA kit utilized Placement Confirmation: ETT inserted through vocal cords under direct vision,  positive ETCO2 and breath sounds checked- equal and bilateral Secured at: 22 cm Tube secured with: Tape Dental Injury: Teeth and Oropharynx as per pre-operative assessment  Difficulty Due To: Difficult Airway- due to anterior larynx

## 2017-09-08 NOTE — Anesthesia Post-op Follow-up Note (Signed)
Anesthesia QCDR form completed.        

## 2017-09-08 NOTE — Op Note (Signed)
[facility]   OPERATIVE NOTE  PROCEDURE:   1.  Left carotid endarterectomy with CorMatrix arterial patch reconstruction  PRE-OPERATIVE DIAGNOSIS: 1.  Critical carotid stenosis   POST-OPERATIVE DIAGNOSIS: same as above   SURGEON: Katha Cabal, MD  ASSISTANT(S): Ms. Hezzie Bump  ANESTHESIA: general  ESTIMATED BLOOD LOSS: 150 cc  FINDING(S): 1.  Extensive calcified carotid plaque.  SPECIMEN(S):  Carotid plaque (sent to Pathology)  INDICATIONS:   [name] is a 57 y.o. y.o. male who presents with left  carotid stenosis of 90%.  The risks, benefits, and alternatives to carotid endarterectomy were discussed with the patient. The differences between carotid stenting and carotid endarterectomy were reviewed.  The patient voiced understanding and appears to be aware that the risks of carotid endarterectomy include but are not limited to: bleeding, infection, stroke, myocardial infarction, death, cranial nerve injuries both temporary and permanent, neck hematoma, possible airway compromise, labile blood pressure post-operatively, cerebral hyperperfusion syndrome, and possible need for additional interventions in the future. The patient is aware of the risks and agrees to proceed forward with the procedure.  DESCRIPTION: After full informed written consent was obtained from the patient, the patient was brought back to the operating room and placed supine upon the operating table.  Prior to induction, the patient received IV antibiotics.  After obtaining adequate anesthesia, the patient was placed a supine position with a shoulder roll in place and the patient's neck slightly hyperextended and rotated away from the surgical site.  The patient was prepped in the standard fashion for a carotid endarterectomy.    The incision was made anterior to the sternocleidomastoid muscle and dissected down through the subcutaneous tissue.  The platysmas was opened with  electrocautery.  The internal jugular vein and facial vein were identified.  The facial vein is ligated and divided between 2-0 silk ties.  The omohyoid was identified in the common carotid artery exposed at this level. The dissection was there in carried out along the carotid artery in a cranial direction.  The dissection was then carried along periadventitial plane along the common carotid artery up to the bifurcation. The external carotid artery was identified. Vessel loops were then placed around the external carotid artery as well as the superior thyroid artery. In the process of this dissection, the hypoglossal nerve was identified and protected from harm.  The internal carotid artery was then dissected circumferentially just beyond an area in the internal carotid artery distal to the plaque.    At this point, we gave the patient 7000 units of intravenous heparin.  After this was allowed to circulate for several minutes, the common carotid followed by the external carotid and then the internal carotid artery were clamped.  Arteriotomy was made in the common carotid artery with a 11 blade, and extended the arteriotomy with a Potts scissor down into the common carotid artery, then the arteriotomy was carried through the bifurcation into the internal carotid artery until I reached an area that was not diseased.  At this point, a Sundt shunt was placed.  The endarterectomy was begun in the common carotid artery with a Garment/textile technologist and carried this dissection down into the common carotid artery circumferentially.  Then I transected the plaque at a segment where it was adherent and transected the plaque with Potts scissors.  I then carried this dissection up into the external carotid artery.  The plaque was extracted by unclamping the external carotid artery and performing an eversion  endarterectomy.  The dissection was then carried into the internal carotid artery where a  feathered end point was  created.  The plaque was passed off the field as a specimen.  The distal endpoint was tacked down with 6 interrupted 7-0 Prolene sutures.  A CorMatrix arterial patch was delivered onto the field and trimmed appropriately for the artery and sewed in place with 6-0 Prolene using a 4 quadrant technique.  The medial suture line was completed and the lateral suture line was run approximately one quarter the length of the arteriotomy.  Prior to completing this patch angioplasty, the shunt was removed, the internal carotid artery was flushed and there was excellent backbleeding.  The carotid artery repair was flushed with heparinized saline and then the patch angioplasty was completed in the usual fashion.  The flow was then reestablished first to the external carotid artery and then the internal carotid artery to prevent distal embolization.   Several minutes of pressure were held and 6-0 Prolene patch sutures were used as need for hemostasis.  At this point, I placed Surgicel and Evicel topical hemostatic agents.  There was no more active bleeding in the surgical site.  The sternocleidomastoid space was closed with three interrupted 3-0 Vicryl sutures. I then reapproximated the platysma muscle with a running stitch of 3-0 Vicryl.  The skin was then closed with a running subcuticular 4-0 Monocryl.  The skin was then cleaned, dried and Dermabond was used to reinforce the skin closure.  The patient awakened and was taken to the recovery room in stable condition, following commands and moving all four extremities without any apparent deficits.    COMPLICATIONS: none  CONDITION: stable  Hortencia Pilar 09/08/2017<11:18 AM

## 2017-09-08 NOTE — Progress Notes (Signed)
Patient having low BP and low oxygen saturation postop. Dr Kayleen Memos ( anesthesiologist) at bedside during PACU recovery, orders given for BP meds and breathing. Patient denies shortness of breath or discomfort. Color pink, skin warm and dry, Alert and oriented. CXR taken.

## 2017-09-08 NOTE — Anesthesia Preprocedure Evaluation (Signed)
Anesthesia Evaluation  Patient identified by MRN, date of birth, ID band Patient awake    Reviewed: Allergy & Precautions, NPO status , Patient's Chart, lab work & pertinent test results  Airway Mallampati: II  TM Distance: >3 FB     Dental  (+) Teeth Intact   Pulmonary COPD, former smoker,    Pulmonary exam normal        Cardiovascular + CAD, + Past MI, + Peripheral Vascular Disease and +CHF  Normal cardiovascular exam     Neuro/Psych negative neurological ROS  negative psych ROS   GI/Hepatic negative GI ROS, Neg liver ROS,   Endo/Other  diabetes, Well Controlled, Type 2, Oral Hypoglycemic Agents  Renal/GU   negative genitourinary   Musculoskeletal negative musculoskeletal ROS (+)   Abdominal Normal abdominal exam  (+)   Peds negative pediatric ROS (+)  Hematology negative hematology ROS (+)   Anesthesia Other Findings   Reproductive/Obstetrics                             Anesthesia Physical Anesthesia Plan  ASA: III  Anesthesia Plan: General   Post-op Pain Management:    Induction: Intravenous  PONV Risk Score and Plan:   Airway Management Planned: Oral ETT  Additional Equipment: Arterial line  Intra-op Plan:   Post-operative Plan: Extubation in OR  Informed Consent: I have reviewed the patients History and Physical, chart, labs and discussed the procedure including the risks, benefits and alternatives for the proposed anesthesia with the patient or authorized representative who has indicated his/her understanding and acceptance.   Dental advisory given  Plan Discussed with: CRNA and Surgeon  Anesthesia Plan Comments:         Anesthesia Quick Evaluation

## 2017-09-08 NOTE — H&P (Signed)
[facility] Admission History & Physical  MRN : [id number]  [name] is a 57 y.o. (1960/09/21) male who presents with chief complaint of blocked carotid artery.  History of Present Illness: The patient is here for surgery.  There have not been any changes in his health since his last office visit in December 2018. CT scan was done 07/19/2017. Patient reports that the test went well with no problems or complications.   The patient denies interval amaurosis fugax. There is no recent or interval TIA symptoms or focal motor deficits. There is no prior documented CVA.  The patient is taking enteric-coated aspirin 81 mg daily.  There is no history of migraine headaches. There is no history of seizures.  The patient has a history of coronary artery disease, no recent episodes of angina or shortness of breath. The patient denies PAD or claudication symptoms. There is a history of hyperlipidemia which is being treated with a statin.   CT angiogram is reviewed by me personally and shows >31% RICA and >51% LICA stenosis.     Current Facility-Administered Medications  Medication Dose Route Frequency Provider Last Rate Last Dose  . ceFAZolin (ANCEF) 2-4 GM/100ML-% IVPB           . ceFAZolin (ANCEF) IVPB 2g/100 mL premix  2 g Intravenous On Call to [place], PA-C      . Chlorhexidine Gluconate Cloth 2 % PADS 6 each  6 each Topical Once Stegmayer, Kimberly A, PA-C       And  . Chlorhexidine Gluconate Cloth 2 % PADS 6 each  6 each Topical Once Stegmayer, Janalyn Harder, PA-C        Past Medical History:  Diagnosis Date  . Cancer ([place])    skin (scalp)  . CHF (congestive heart failure) ([place])   . COPD (chronic obstructive pulmonary disease) ([place])   . Coronary artery disease   . Diabetes mellitus without complication ([place])   . MI (myocardial infarction) [facility])     Past Surgical History:  Procedure Laterality Date  . APPLICATION OF WOUND VAC      chest wound s/p CABG  . CARDIAC CATHETERIZATION    . CORONARY ARTERY BYPASS GRAFT  2015   3 vessels  . PERIPHERAL VASCULAR CATHETERIZATION N/A 06/09/2015   Procedure: Abdominal Aortogram w/Lower Extremity;  Surgeon: Katha Cabal, MD;  Location: [facility];  Service: Cardiovascular;  Laterality: N/A;  . PERIPHERAL VASCULAR CATHETERIZATION  06/09/2015   Procedure: Lower Extremity Intervention;  Surgeon: Katha Cabal, MD;  Location: [facility];  Service: Cardiovascular;;  . stent in leg Left   . WOUND DEBRIDEMENT     debridement of chest wound x 2 s/p CABG     Social History Social History   Tobacco Use  . Smoking status: Former Research scientist (life sciences)  . Smokeless tobacco: Never Used  Substance Use Topics  . Alcohol use: Yes    Comment: seldom  . Drug use: No    Family History Family History  Problem Relation Age of Onset  . Heart attack Mother   . Cancer Father   No family history of bleeding/clotting disorders, porphyria or autoimmune disease   No Known Allergies   REVIEW OF SYSTEMS (Negative unless checked)  Constitutional: [] Weight loss  [] Fever  [] Chills Cardiac: [] Chest pain   [] Chest pressure   [] Palpitations   [] Shortness of breath when laying flat   [] Shortness of breath at rest   [] Shortness of  breath with exertion. Vascular:  [] Pain in legs with walking   [] Pain in legs at rest   [] Pain in legs when laying flat   [] Claudication   [] Pain in feet when walking  [] Pain in feet at rest  [] Pain in feet when laying flat   [] History of DVT   [] Phlebitis   [] Swelling in legs   [] Varicose veins   [] Non-healing ulcers Pulmonary:   [] Uses home oxygen   [] Productive cough   [] Hemoptysis   [] Wheeze  [] COPD   [] Asthma Neurologic:  [x] Dizziness  [] Blackouts   [] Seizures   [] History of stroke   [] History of TIA  [] Aphasia   [] Temporary blindness   [] Dysphagia   [] Weakness or numbness in arms   [] Weakness or numbness in legs Musculoskeletal:  [] Arthritis   [] Joint  swelling   [] Joint pain   [] Low back pain Hematologic:  [] Easy bruising  [] Easy bleeding   [] Hypercoagulable state   [] Anemic  [] Hepatitis Gastrointestinal:  [] Blood in stool   [] Vomiting blood  [] Gastroesophageal reflux/heartburn   [] Difficulty swallowing. Genitourinary:  [] Chronic kidney disease   [] Difficult urination  [] Frequent urination  [] Burning with urination   [] Blood in urine Skin:  [] Rashes   [] Ulcers   [] Wounds Psychological:  [] History of anxiety   []  History of major depression.  Physical Examination  Vitals:   09/08/17 0610  BP: 129/62  Pulse: 86  Resp: 18  Temp: (!) 97.1 F (36.2 C)  TempSrc: Tympanic  SpO2: 95%  Weight: 250 lb (113.4 kg)  Height: 5\' 7"  (1.702 m)   Body mass index is 39.16 kg/m. Gen: WD/WN, NAD Head: [place]/AT, No temporalis wasting. Prominent temp pulse not noted. Ear/Nose/Throat: Hearing grossly intact, nares w/o erythema or drainage, oropharynx w/o Erythema/Exudate,  Eyes: Conjunctiva clear, sclera non-icteric Neck: Trachea midline.  No JVD.  Pulmonary:  Good air movement, respirations not labored, no use of accessory muscles.  Cardiac: RRR, normal S1, S2. Vascular: bilateral carotid bruits Vessel Right Left  Radial Palpable Palpable  Ulnar Palpable Palpable  Brachial Palpable Palpable  Carotid Palpable, with bruit Palpable, with bruit  Gastrointestinal: soft, non-tender/non-distended. No guarding/reflex.  Musculoskeletal: M/S 5/5 throughout.  Extremities without ischemic changes.  No deformity or atrophy.  Neurologic: Sensation grossly intact in extremities.  Symmetrical.  Speech is fluent. Motor exam as listed above. Psychiatric: Judgment intact, Mood & affect appropriate for pt's clinical situation. Dermatologic: No rashes or ulcers noted.  No cellulitis or open wounds. Lymph : No Cervical, Axillary, or Inguinal lymphadenopathy.     CBC Lab Results  Component Value Date   WBC 5.9 09/08/2017   HGB 11.9 (L) 09/08/2017   HCT 35.9 (L)  09/08/2017   MCV 90.3 09/08/2017   PLT 146 (L) 09/08/2017    BMET    Component Value Date/Time   NA 135 08/17/2017 0926   NA 134 (L) 10/22/2013 0507   K 4.3 08/17/2017 0926   K 4.5 10/22/2013 0507   CL 102 08/17/2017 0926   CL 101 10/22/2013 0507   CO2 26 08/17/2017 0926   CO2 28 10/22/2013 0507   GLUCOSE 102 (H) 08/17/2017 0926   GLUCOSE 300 (H) 10/22/2013 0507   BUN 23 (H) 08/17/2017 0926   BUN 32 (H) 10/22/2013 0507   CREATININE 1.15 08/17/2017 0926   CREATININE 1.18 10/22/2013 0507   CALCIUM 9.0 08/17/2017 0926   CALCIUM 8.0 (L) 10/22/2013 0507   GFRNONAA >60 08/17/2017 0926   GFRNONAA >60 10/22/2013 0507   GFRAA >60 08/17/2017 0926   GFRAA >  60 10/22/2013 0507   CrCl cannot be calculated (Patient's most recent lab result is older than the maximum 21 days allowed.).  COAG Lab Results  Component Value Date   INR 0.87 07/31/2017    Radiology No results found.  Assessment/Plan  1. Bilateral carotid artery stenosis Recommend:  The patient remains asymptomatic with respect to the carotid stenosis.  However, the patient has now progressed and has a lesion the is >75%.  Patient's CT angiography of the carotid arteries confirms >75% left ICA stenosis.  The anatomical considerations support surgery over stenting.  This was discussed in detail with the patient.  The patient does indeed need surgery, therefore, cardiac clearance will be arranged. Once cleared the patient will be scheduled for surgery.  The risks, benefits and alternative therapies were reviewed in detail with the patient.  All questions were answered.  The patient agrees to proceed with surgery of the left carotid artery.  Continue antiplatelet therapy as prescribed. Continue management of CAD, HTN and Hyperlipidemia. Healthy heart diet, encouraged exercise at least 4 times per week.    2. Type 2 diabetes mellitus with other circulatory complication, without long-term current use of insulin  (HCC) Continue hypoglycemic medications as already ordered, these medications have been reviewed and there are no changes at this time.  Hgb A1C to be monitored as already arranged by primary service   3. Hyperlipidemia, unspecified hyperlipidemia type Continue statin as ordered and reviewed, no changes at this time     Hortencia Pilar, MD  09/08/2017 7:28 AM

## 2017-09-08 NOTE — Transfer of Care (Signed)
Immediate Anesthesia Transfer of Care Note  Patient: [name]  Procedure(s) Performed: ENDARTERECTOMY CAROTID (Left )  Patient Location: PACU  Anesthesia Type:General  Level of Consciousness: awake, alert  and oriented  Airway & Oxygen Therapy: Patient Spontanous Breathing and Patient connected to face mask oxygen  Post-op Assessment: Report given to RN and Post -op Vital signs reviewed and stable  Post vital signs: Reviewed and stable  Last Vitals:  Vitals:   09/08/17 0610 09/08/17 1201  BP: 129/62 100/70  Pulse: 86 92  Resp: 18 16  Temp: (!) 36.2 C   SpO2: 95% 95%    Last Pain:  Vitals:   09/08/17 0610  TempSrc: Tympanic  PainSc: 6          Complications: No apparent anesthesia complications

## 2017-09-08 NOTE — Progress Notes (Signed)
Inpatient Diabetes Program Recommendations  AACE/ADA: New Consensus Statement on Inpatient Glycemic Control (2015)  Target Ranges:  Prepandial:   less than 140 mg/dL      Peak postprandial:   less than 180 mg/dL (1-2 hours)      Critically ill patients:  140 - 180 mg/dL   Lab Results  Component Value Date   GLUCAP 284 (H) 09/08/2017    Review of Glycemic Control  Results for TERRANCE, USERY (MRN [id number]) as of 09/08/2017 12:24  Ref. Range 09/08/2017 06:11 09/08/2017 11:51  Glucose-Capillary Latest Ref Range: 65 - 99 mg/dL 212 (H) 284 (H)    Diabetes history: Type 2 Outpatient Diabetes medications: Glipizide 10mg  bid, Novolin 70/30 20 units bid, Metformin 1000mg  bid, Victoza 0.9mg  qday Current orders for Inpatient glycemic control: Metformin 1000mg  bid, Novolog 0-15 units tid, Victoza 0.9mg  qday  Inpatient Diabetes Program Recommendations: Please d/c Metformin while inpatient (oral agents are not recommended by the American diabetes Association while inpatient).  Victoza is unavailable while inpatient- please d/c.  Please order a portion of the patient's home basal insulin- consider Levemir 7 units bid (50% reduction from home dose) and add Novolog 3 units tid (50% reduction from home dose).  Continue Novolog correction as ordered.   Consider adding Novolog 0-5 units qhs.  Gentry Fitz, RN, BA, MHA, CDE Diabetes Coordinator Inpatient Diabetes Program  [phone] (Team Pager) [phone] ([place]) 09/08/2017 12:30 PM

## 2017-09-09 ENCOUNTER — Inpatient Hospital Stay: Admission: RE | Admit: 2017-09-09 | Payer: Medicare HMO | Source: Ambulatory Visit | Admitting: Vascular Surgery

## 2017-09-09 ENCOUNTER — Inpatient Hospital Stay: Payer: Medicare HMO

## 2017-09-09 DIAGNOSIS — I9581 Postprocedural hypotension: Secondary | ICD-10-CM

## 2017-09-09 DIAGNOSIS — I6522 Occlusion and stenosis of left carotid artery: Principal | ICD-10-CM

## 2017-09-09 DIAGNOSIS — N179 Acute kidney failure, unspecified: Secondary | ICD-10-CM

## 2017-09-09 LAB — CBC
HEMATOCRIT: 34.8 % — AB (ref 40.0–52.0)
HEMOGLOBIN: 11.1 g/dL — AB (ref 13.0–18.0)
MCH: 29.5 pg (ref 26.0–34.0)
MCHC: 31.8 g/dL — ABNORMAL LOW (ref 32.0–36.0)
MCV: 92.6 fL (ref 80.0–100.0)
Platelets: 150 10*3/uL (ref 150–440)
RBC: 3.75 MIL/uL — ABNORMAL LOW (ref 4.40–5.90)
RDW: 14.2 % (ref 11.5–14.5)
WBC: 10.3 10*3/uL (ref 3.8–10.6)

## 2017-09-09 LAB — PHOSPHORUS: PHOSPHORUS: 4.1 mg/dL (ref 2.5–4.6)

## 2017-09-09 LAB — BASIC METABOLIC PANEL
ANION GAP: 4 — AB (ref 5–15)
BUN: 23 mg/dL — ABNORMAL HIGH (ref 6–20)
CO2: 25 mmol/L (ref 22–32)
Calcium: 7.6 mg/dL — ABNORMAL LOW (ref 8.9–10.3)
Chloride: 109 mmol/L (ref 101–111)
Creatinine, Ser: 1.49 mg/dL — ABNORMAL HIGH (ref 0.61–1.24)
GFR, EST AFRICAN AMERICAN: 58 mL/min — AB (ref >60)
GFR, EST NON AFRICAN AMERICAN: 50 mL/min — AB (ref >60)
Glucose, Bld: 165 mg/dL — ABNORMAL HIGH (ref 65–99)
POTASSIUM: 4.8 mmol/L (ref 3.5–5.1)
SODIUM: 138 mmol/L (ref 135–145)

## 2017-09-09 LAB — GLUCOSE, CAPILLARY
GLUCOSE-CAPILLARY: 133 mg/dL — AB (ref 65–99)
Glucose-Capillary: 197 mg/dL — ABNORMAL HIGH (ref 65–99)
Glucose-Capillary: 235 mg/dL — ABNORMAL HIGH (ref 65–99)
Glucose-Capillary: 207 mg/dL — ABNORMAL HIGH (ref 65–99)

## 2017-09-09 LAB — PROTIME-INR
INR: 1.07
PROTHROMBIN TIME: 13.8 s (ref 11.4–15.2)

## 2017-09-09 LAB — LACTIC ACID, PLASMA: LACTIC ACID, VENOUS: 1.4 mmol/L (ref 0.5–1.9)

## 2017-09-09 LAB — MAGNESIUM: Magnesium: 1.5 mg/dL — ABNORMAL LOW (ref 1.7–2.4)

## 2017-09-09 LAB — TROPONIN I: Troponin I: <0.03 ng/mL (ref <0.03)

## 2017-09-09 MED ORDER — HYDROCORTISONE NA SUCCINATE PF 100 MG IJ SOLR
50.0000 mg | Freq: Three times a day (TID) | INTRAMUSCULAR | Status: DC
Start: 2017-09-09 — End: 2017-09-10
  Administered 2017-09-09 – 2017-09-10 (×4): 50 mg via INTRAVENOUS
  Filled 2017-09-09 (×4): qty 2

## 2017-09-09 MED ORDER — PANTOPRAZOLE SODIUM 40 MG PO TBEC
40.0000 mg | DELAYED_RELEASE_TABLET | Freq: Every day | ORAL | Status: DC
  Administered 2017-09-09: 40 mg via ORAL
  Filled 2017-09-09: qty 1

## 2017-09-09 MED ORDER — INSULIN ASPART 100 UNIT/ML ~~LOC~~ SOLN
0.0000 [IU] | SUBCUTANEOUS | Status: DC
  Administered 2017-09-09: 7 [IU] via SUBCUTANEOUS
  Administered 2017-09-09: 4 [IU] via SUBCUTANEOUS
  Administered 2017-09-09: 7 [IU] via SUBCUTANEOUS
  Administered 2017-09-09: 3 [IU] via SUBCUTANEOUS
  Administered 2017-09-09: 7 [IU] via SUBCUTANEOUS
  Administered 2017-09-10 (×3): 4 [IU] via SUBCUTANEOUS
  Administered 2017-09-10: 7 [IU] via SUBCUTANEOUS
  Filled 2017-09-09 (×9): qty 1

## 2017-09-09 MED ORDER — MAGNESIUM SULFATE 2 GM/50ML IV SOLN
2.0000 g | Freq: Once | INTRAVENOUS | Status: AC
  Administered 2017-09-09: 2 g via INTRAVENOUS
  Filled 2017-09-09: qty 50

## 2017-09-09 MED ORDER — DULOXETINE HCL 30 MG PO CPEP
30.0000 mg | ORAL_CAPSULE | Freq: Every day | ORAL | Status: DC
  Administered 2017-09-09 – 2017-09-10 (×2): 30 mg via ORAL
  Filled 2017-09-09 (×2): qty 1

## 2017-09-09 MED ORDER — KETOROLAC TROMETHAMINE 30 MG/ML IJ SOLN
30.0000 mg | Freq: Four times a day (QID) | INTRAMUSCULAR | Status: AC
  Administered 2017-09-09 (×2): 30 mg via INTRAVENOUS
  Filled 2017-09-09 (×2): qty 1

## 2017-09-09 NOTE — Consult Note (Signed)
PULMONARY / CRITICAL CARE MEDICINE   Name: [name] MRN: [id number] DOB: 10/14/1960    ADMISSION DATE:  09/08/2017   CONSULTATION DATE:  09/08/2017  REFERRING MD:  Dr. Delana Meyer  REASON: Shock post-op  HISTORY OF PRESENT ILLNESS:   This is a 57 y/o caucasian male with a PMH as indicated below who presented with severe carotid stenosis; underwent a Left carotid endarterectomy with CorMatrix arterial patch reconstruction. Patient became hypotensive post-op requiring fluid boluses and pressors. Patient is still one pressors but reports feeling better. C/O headache that is generalized, no associated symptoms and moderate relieve with current pain meds.   PAST MEDICAL HISTORY :  He  has a past medical history of Cancer ([place]), CHF (congestive heart failure) ([place]), COPD (chronic obstructive pulmonary disease) ([place]), Coronary artery disease, Diabetes mellitus without complication ([place]), and MI (myocardial infarction) ([place]).  PAST SURGICAL HISTORY: He  has a past surgical history that includes Cardiac catheterization (N/A, 06/09/2015); Cardiac catheterization (06/09/2015); Coronary artery bypass graft (2015); Cardiac catheterization; Application if wound vac; Wound debridement; and stent in leg (Left).  No Known Allergies  No current facility-administered medications on file prior to encounter.    Current Outpatient Medications on File Prior to Encounter  Medication Sig  . acetaminophen (TYLENOL) 500 MG tablet Take 1,000-1,500 mg by mouth every 4 (four) hours as needed for moderate pain or headache.   . albuterol (PROVENTIL HFA;VENTOLIN HFA) 108 (90 Base) MCG/ACT inhaler Inhale 2 puffs into the lungs every 6 (six) hours as needed for wheezing or shortness of breath.  . allopurinol (ZYLOPRIM) 100 MG tablet Take 100 mg by mouth daily.  . carvedilol (COREG) 6.25 MG tablet Take 6.25 mg by mouth 2 (two) times daily with a meal.  . DULoxetine (CYMBALTA) 30 MG capsule TAKE 1 CAPSULE (30 MG TOTAL)  BY MOUTH ONCE DAILY.  . furosemide (LASIX) 40 MG tablet Take 40 mg by mouth daily.  Marland Kitchen glipiZIDE (GLUCOTROL XL) 10 MG 24 hr tablet Take 10 mg by mouth 2 (two) times daily.   Marland Kitchen HYDROcodone-acetaminophen (NORCO) 7.5-325 MG tablet Take 1 tablet by mouth at bedtime.   . insulin NPH-regular Human (NOVOLIN 70/30) (70-30) 100 UNIT/ML injection Inject 35 Units into the skin 2 (two) times daily with a meal.   . lisinopril (PRINIVIL,ZESTRIL) 2.5 MG tablet Take 2.5 mg by mouth daily.   . metFORMIN (GLUCOPHAGE) 1000 MG tablet Take 1,000 mg by mouth 2 (two) times daily with a meal.  . pravastatin (PRAVACHOL) 40 MG tablet Take 40 mg by mouth daily.  Marland Kitchen rOPINIRole (REQUIP) 1 MG tablet Take 1 mg by mouth at bedtime.   . tamsulosin (FLOMAX) 0.4 MG CAPS capsule TAKE 1 CAPSULE (0.4 MG TOTAL) BY MOUTH ONCE DAILY. TAKE 30 MINUTES AFTER SAME MEAL EACH DAY.  Marland Kitchen tiotropium (SPIRIVA) 18 MCG inhalation capsule Place 18 mcg into inhaler and inhale daily.  Marland Kitchen VICTOZA 18 MG/3ML SOPN Inject 0.9 mg into the skin daily.  Marland Kitchen aspirin EC 81 MG tablet Take 81 mg by mouth daily.  . cyanocobalamin 1000 MCG tablet Take 1,000 mcg by mouth daily.  . magnesium gluconate (MAGONATE) 500 MG tablet Take 500 mg by mouth daily.    FAMILY HISTORY:  His indicated that his mother is deceased. He indicated that his father is deceased.   SOCIAL HISTORY: He  reports that he has quit smoking. he has never used smokeless tobacco. He reports that he drinks alcohol. He reports that he does not use drugs.  REVIEW OF  SYSTEMS:   Constitutional: Negative for fever and chills.  HENT: Negative for congestion and rhinorrhea but reports sore throat  Eyes: Negative for redness and visual disturbance.  Respiratory: Negative for shortness of breath and wheezing.  Cardiovascular: Negative for chest pain and palpitations.  Gastrointestinal: Negative  for nausea , vomiting and abdominal pain and  Loose stools Genitourinary: Negative for dysuria and urgency.   Endocrine: Denies polyuria, polyphagia and heat intolerance Musculoskeletal: reports neck pain with flexion and extension Skin: Negative for pallor and wound.  Neurological: Negative for dizziness and headaches   SUBJECTIVE:   VITAL SIGNS: BP (!) 70/43   Pulse 86   Temp (!) 97.5 F (36.4 C) (Oral)   Resp (!) 22   Ht 5\' 7"  (1.702 m)   Wt 263 lb 0.1 oz (119.3 kg)   SpO2 94%   BMI 41.19 kg/m   HEMODYNAMICS:    VENTILATOR SETTINGS: FiO2 (%):  [32 %] 32 %  INTAKE / OUTPUT: I/O last 3 completed shifts: In: 2773.6 [P.O.:100; I.V.:2073.6; IV Piggyback:600] Out: 1050 [Urine:900; Blood:150]  PHYSICAL EXAMINATION: General:  Awake, pleasant, NAD Neuro:  AAO X4, CN intact HEENT:  PERRLA, Left neck incision intact with no drainage Cardiovascular: RRR, S1/S2, no MRG, +2 pulses, no edema Lungs: Bilateral breath sounds, diminished in the bases Abdomen: soft, NT/ND, normal bowel sounds Musculoskeletal:  +rom Skin: warm and dry  LABS:  BMET Recent Labs  Lab 09/08/17 0655  NA 139  K 4.1  CL 109  CO2 24  BUN 23*  CREATININE 0.99  GLUCOSE 224*    Electrolytes Recent Labs  Lab 09/08/17 0655  CALCIUM 9.0    CBC Recent Labs  Lab 09/08/17 0655  WBC 5.9  HGB 11.9*  HCT 35.9*  PLT 146*    Coag's Recent Labs  Lab 09/08/17 0655  APTT 28  INR 1.04    Sepsis Markers No results for input(s): LATICACIDVEN, PROCALCITON, O2SATVEN in the last 168 hours.  ABG Recent Labs  Lab 09/08/17 1234 09/08/17 1325  PHART 7.23* 7.25*  PCO2ART 46 48  PO2ART 58* 54*    Liver Enzymes No results for input(s): AST, ALT, ALKPHOS, BILITOT, ALBUMIN in the last 168 hours.  Cardiac Enzymes No results for input(s): TROPONINI, PROBNP in the last 168 hours.  Glucose Recent Labs  Lab 09/08/17 0611 09/08/17 1151 09/08/17 1348 09/08/17 1639 09/08/17 2205  GLUCAP 212* 284* 318* 264* 161*    Imaging Dg Chest Port 1 View  Result Date: 09/08/2017 CLINICAL DATA:   Postoperative hypoxia. EXAM: PORTABLE CHEST 1 VIEW COMPARISON:  Chest CT 01/11/2017 FINDINGS: The heart is mildly enlarged but stable. The mediastinal and hilar contours are within normal limits and unchanged. Chronic bronchitic type interstitial changes from interstitial lung disease. No definite acute overlying pulmonary process. Could not exclude small pleural effusions. IMPRESSION: Chronic lung disease without obvious acute overlying pulmonary process except for possible small bilateral pleural effusions. Electronically Signed   By: Marijo Sanes M.D.   On: 09/08/2017 12:40    ANTIBIOTICS: Cefuroxime for surgical prophylaxis SIGNIFICANT EVENTS: 09/08/2017>admitted  LINES/TUBES: PIVs  ASSESSMENT  Severe carotid stenosis s/p Left carotid endarterectomy with CorMatrix arterial patch reconstruction Post-op hypotension Headache Left neck pain 2/2 surgical incision History of: T2DM, COPD, CHF and CAD S/P MI  PLAN Hemodynamics per ICU Pressors to maintain MAP>90% Hold IV fluids Continue all home emdications Cardiology consult IV diuresis POCT with SSI coverage Monitor incision and for worsening headache GI and DVT prophylaxis  FAMILY  - Updates:  Patient updated on current treatment plan  Magdalene S. [facility] ANP-BC Pulmonary and Critical Care Medicine [facility] Pager [phone] or [phone]  NB: This document was prepared using Dragon voice recognition software and may include unintentional dictation errors.   09/09/2017, 12:30 AM

## 2017-09-09 NOTE — Progress Notes (Signed)
PHARMACIST - PHYSICIAN COMMUNICATION  CONCERNING: IV to Oral Route Change Policy  RECOMMENDATION: This patient is receiving pantoprazole by the intravenous route.  Based on criteria approved by the Pharmacy and Therapeutics Committee, the intravenous medication(s) is/are being converted to the equivalent oral dose form(s).   DESCRIPTION: These criteria include:  The patient is eating (either orally or via tube) and/or has been taking other orally administered medications for a least 24 hours  The patient has no evidence of active gastrointestinal bleeding or impaired GI absorption (gastrectomy, short bowel, patient on TNA or NPO).  If you have questions about this conversion, please contact the Pharmacy Department  []   [phone] )  [facility] [x]   [phone] )  [facility] []   [phone] )  [facility] []   [phone] )  [facility] []   [phone] )  [place], [facility] 09/09/2017 7:58 AM

## 2017-09-09 NOTE — Progress Notes (Signed)
    Subjective  - POD #1, s/p L CEA  Feels a little sore in left neck Tolerating PO and ambulating   Physical Exam:  Incision c/d/i without hematoma Neuro intact, no deficits       Assessment/Plan:  POD #1  Continue to wean Neo.  Should be able to go home once off of pressors  [name] 09/09/2017 4:18 PM --  Vitals:   09/09/17 1430 09/09/17 1500  BP: (!) 81/57 (!) 93/54  Pulse: 71 71  Resp: 15 17  Temp:    SpO2: 100% 95%    Intake/Output Summary (Last 24 hours) at 09/09/2017 1618 Last data filed at 09/09/2017 1500 Gross per 24 hour  Intake 4520.14 ml  Output 990 ml  Net 3530.14 ml     Laboratory CBC    Component Value Date/Time   WBC 10.3 09/09/2017 0523   HGB 11.1 (L) 09/09/2017 0523   HGB 12.7 (L) 10/22/2013 0507   HCT 34.8 (L) 09/09/2017 0523   HCT 38.8 (L) 10/22/2013 0507   PLT 150 09/09/2017 0523   PLT 125 (L) 10/22/2013 0507    BMET    Component Value Date/Time   NA 138 09/09/2017 0523   NA 134 (L) 10/22/2013 0507   K 4.8 09/09/2017 0523   K 4.5 10/22/2013 0507   CL 109 09/09/2017 0523   CL 101 10/22/2013 0507   CO2 25 09/09/2017 0523   CO2 28 10/22/2013 0507   GLUCOSE 165 (H) 09/09/2017 0523   GLUCOSE 300 (H) 10/22/2013 0507   BUN 23 (H) 09/09/2017 0523   BUN 32 (H) 10/22/2013 0507   CREATININE 1.49 (H) 09/09/2017 0523   CREATININE 1.18 10/22/2013 0507   CALCIUM 7.6 (L) 09/09/2017 0523   CALCIUM 8.0 (L) 10/22/2013 0507   GFRNONAA 50 (L) 09/09/2017 0523   GFRNONAA >60 10/22/2013 0507   GFRAA 58 (L) 09/09/2017 0523   GFRAA >60 10/22/2013 0507    COAG Lab Results  Component Value Date   INR 1.07 09/09/2017   INR 1.04 09/08/2017   INR 0.87 07/31/2017   No results found for: PTT  Antibiotics Anti-infectives (From admission, onward)   Start     Dose/Rate Route Frequency Ordered Stop   09/08/17 1400  cefUROXime (ZINACEF) 1.5 g in sodium chloride 0.9 % 100 mL IVPB     1.5 g 200 mL/hr over 30 Minutes Intravenous Every 12  hours 09/08/17 1351 09/09/17 0339   09/08/17 0627  ceFAZolin (ANCEF) 2-4 GM/100ML-% IVPB    Comments:  Slemenda, Debra   : cabinet override      09/08/17 0627 09/08/17 0802   09/08/17 0600  ceFAZolin (ANCEF) IVPB 2g/100 mL premix     2 g 200 mL/hr over 30 Minutes Intravenous On call to O.R. 09/07/17 2219 09/08/17 3614       V. Leia Alf, M.D. [facility] Office: [phone] Pager:  [phone]

## 2017-09-09 NOTE — Anesthesia Postprocedure Evaluation (Signed)
Anesthesia Post Note  Patient: [name]  Procedure(s) Performed: ENDARTERECTOMY CAROTID (Left )  Patient location during evaluation: ICU Anesthesia Type: General Level of consciousness: awake and alert and oriented Pain management: pain level controlled Respiratory status: spontaneous breathing Anesthetic complications: no Comments: Patient still on Neosynephrine for low BPs, but titrating down.  Off of dopamine.  Patient and wife now admit to a history of lowerBPs x 2 months with occasional symptomatic dizziness when standing up.     Last Vitals:  Vitals:   09/09/17 1300 09/09/17 1330  BP: (!) 99/53 (!) 86/59  Pulse: 80 71  Resp: 18 15  Temp:    SpO2: (!) 89% 95%    Last Pain:  Vitals:   09/09/17 1200  TempSrc: Oral  PainSc:                  Katalena Malveaux

## 2017-09-09 NOTE — Progress Notes (Signed)
PULMONARY / CRITICAL CARE MEDICINE   Name: MARKEESE BOYAJIAN MRN: [id number] DOB: June 30, 1961    ADMISSION DATE:  09/08/2017 CONSULTATION DATE:  09/08/2017  REFERRING MD:  Dr. Delana Meyer   HISTORY OF PRESENT ILLNESS:   This is a 57 y/o caucasian male with a PMH as indicated below who presented with severe carotid stenosis; underwent a Leftcarotid endarterectomy with CorMatrix arterial patch reconstruction. Patient became hypotensive post-op requiring fluid boluses and pressors. Patient is still one pressors but reports feeling better. C/O headache that is generalized, no associated symptoms and moderate relieve with current pain meds.    REVIEW OF SYSTEMS:   Mild pain at neck- surgical site,  SUBJECTIVE:  Patient reported that he feels well  VITAL SIGNS: BP 99/63   Pulse 79   Temp 98.5 F (36.9 C) (Oral)   Resp (!) 21   Ht 5\' 7"  (1.702 m)   Wt 263 lb 0.1 oz (119.3 kg)   SpO2 100%   BMI 41.19 kg/m   HEMODYNAMICS:  Hypotension on Neosynephrine    INTAKE / OUTPUT: I/O last 3 completed shifts: In: 3532 [P.O.:100; I.V.:4261; IV Piggyback:800] Out: 1340 [Urine:1190; Blood:150]  PHYSICAL EXAMINATION: General:  No Distress Neuro:  Awake, alert x4, no deficits HEENT:  Neck neck incision site clean, no bleeding, no stridor Cardiovascular:  RRR, 1+ oedema B/L lower extremities Lungs:  CTA Abdomen:  Soft, +BS Musculoskeletal:  No deformity or limitation Skin:  Warm and dry  LABS:  BMET Recent Labs  Lab 09/08/17 0655 09/09/17 0523  NA 139 138  K 4.1 4.8  CL 109 109  CO2 24 25  BUN 23* 23*  CREATININE 0.99 1.49*  GLUCOSE 224* 165*    Electrolytes Recent Labs  Lab 09/08/17 0655 09/09/17 0523  CALCIUM 9.0 7.6*  MG  --  1.5*  PHOS  --  4.1    CBC Recent Labs  Lab 09/08/17 0655 09/09/17 0523  WBC 5.9 10.3  HGB 11.9* 11.1*  HCT 35.9* 34.8*  PLT 146* 150    Coag's Recent Labs  Lab 09/08/17 0655 09/09/17 0523  APTT 28  --   INR 1.04 1.07    Sepsis  Markers Recent Labs  Lab 09/09/17 0523  LATICACIDVEN 1.4    ABG Recent Labs  Lab 09/08/17 1234 09/08/17 1325  PHART 7.23* 7.25*  PCO2ART 46 48  PO2ART 58* 54*    Liver Enzymes No results for input(s): AST, ALT, ALKPHOS, BILITOT, ALBUMIN in the last 168 hours.  Cardiac Enzymes Recent Labs  Lab 09/09/17 0523  TROPONINI <0.03    Glucose Recent Labs  Lab 09/08/17 1639 09/08/17 2205 09/09/17 0544 09/09/17 0718 09/09/17 1117 09/09/17 1608  GLUCAP 264* 161* 133* 197* 235* 207*    Imaging Dg Chest Port 1 View  Result Date: 09/09/2017 CLINICAL DATA:  Hypotension. EXAM: PORTABLE CHEST 1 VIEW COMPARISON:  Yesterday. FINDINGS: Lower lung volumes from prior exam. Post median sternotomy. Unchanged cardiomegaly. Chronic bronchitic changes. Possible small pleural effusions again seen. Body habitus limits detailed assessment. IMPRESSION: Unchanged cardiomegaly and bronchitic change. Possible small pleural effusions. Electronically Signed   By: Jeb Levering M.D.   On: 09/09/2017 06:16     SIGNIFICANT EVENTS: Patient had episode of hypotension in the night requiring upward titration of Neosynephrine.   DISCUSSION: This 57 year old gentleman with known CAD with poor LVF is POD #1 of left carotid endarterectomy. He is still requiring BP support and has had poor urinary outpit inspite of LAsix  ASSESSMENT / PLAN:  1. Severe Carotid artery disease- POD# 1 of Carotid endarterectomy 2 . Persistent post op hypotension still on vasopressor support 3. Acute post op Kidney injury 4. Hx CAD with poor LVF Hx COPD  Plan 1. Continue vasopressor.  Patient will likely need higher BP for good urinary output. 2. Schedule 2D ECHO 3. Incentive spirometry 4. Keep hydrated and monitor UO and creatinine 5. Avoid Nephrotoxic meds.  Patient's family updated.  Cammie Sickle, M.D Pulmonary and [place] Pager: [phone]  09/09/2017, 6:18 PM

## 2017-09-09 NOTE — Consult Note (Signed)
[facility] Consultation Note  Patient ID: [name], MRN: [id number], DOB/AGE: 59-23-1962 59 y.o. Admit date: 09/08/2017   Date of Consult: 09/09/2017 Primary Physician: Tracie Harrier, MD Primary Cardiologist: Ubaldo Glassing  Chief Complaint: No chief complaint on file.  Reason for Consult: Hypotension  HPI: 57 y.o. male with known coronary artery disease status post a coronary artery bypass graft and inferior myocardial infarction essential hypertension mixed hyperlipidemia and mildly dilated cardiomyopathy with ejection fraction of 35-40% appropriately on medications for cardiomyopathy and cardiovascular disease.  Patient has not had any recent congestive heart failure anginal symptoms and has undergone a left carotid endarterectomy due to significant progression of stenosis.  He subsequently has had some hypotension requiring levo fed.  Levo fed has been working fairly well for him at this time supporting his blood pressure consistently above 80 systolic.  The patient has had no additional medications of carvedilol and lisinopril at this time and no concerns for rhythm disturbances or anginal symptoms.  He does have some basilar crackles and lower extremity edema consistent with mild acute on chronic systolic dysfunction congestive heart failure due to volume overload after his surgery.  Otherwise no other significant symptoms have occurred.  The patient therefore may need further management  Past Medical History:  Diagnosis Date  . Cancer ([place])    skin (scalp)  . CHF (congestive heart failure) ([place])   . COPD (chronic obstructive pulmonary disease) ([place])   . Coronary artery disease   . Diabetes mellitus without complication ([place])   . MI (myocardial infarction) [facility])       Surgical History:  Past Surgical History:  Procedure Laterality Date  . APPLICATION OF WOUND VAC     chest wound s/p CABG  . CARDIAC CATHETERIZATION    . CORONARY ARTERY BYPASS GRAFT  2015   3  vessels  . PERIPHERAL VASCULAR CATHETERIZATION N/A 06/09/2015   Procedure: Abdominal Aortogram w/Lower Extremity;  Surgeon: Katha Cabal, MD;  Location: [place];  Service: Cardiovascular;  Laterality: N/A;  . PERIPHERAL VASCULAR CATHETERIZATION  06/09/2015   Procedure: Lower Extremity Intervention;  Surgeon: Katha Cabal, MD;  Location: [place] [facility];  Service: Cardiovascular;;  . stent in leg Left   . WOUND DEBRIDEMENT     debridement of chest wound x 2 s/p CABG      Home Meds: Prior to Admission medications   Medication Sig Start Date End Date Taking? Authorizing Provider  acetaminophen (TYLENOL) 500 MG tablet Take 1,000-1,500 mg by mouth every 4 (four) hours as needed for moderate pain or headache.    Yes [provider]  albuterol (PROVENTIL HFA;VENTOLIN HFA) 108 (90 Base) MCG/ACT inhaler Inhale 2 puffs into the lungs every 6 (six) hours as needed for wheezing or shortness of breath.   Yes [provider]  allopurinol (ZYLOPRIM) 100 MG tablet Take 100 mg by mouth daily. 07/04/17  Yes [provider]  carvedilol (COREG) 6.25 MG tablet Take 6.25 mg by mouth 2 (two) times daily with a meal.   Yes [provider]  DULoxetine (CYMBALTA) 30 MG capsule TAKE 1 CAPSULE (30 MG TOTAL) BY MOUTH ONCE DAILY. 06/23/17  Yes [provider]  furosemide (LASIX) 40 MG tablet Take 40 mg by mouth daily.   Yes [provider]  glipiZIDE (GLUCOTROL XL) 10 MG 24 hr tablet Take 10 mg by mouth 2 (two) times daily.    Yes [provider]  HYDROcodone-acetaminophen (NORCO) 7.5-325 MG tablet Take 1  tablet by mouth at bedtime.    Yes [provider]  insulin NPH-regular Human (NOVOLIN 70/30) (70-30) 100 UNIT/ML injection Inject 35 Units into the skin 2 (two) times daily with a meal.    Yes [provider]  lisinopril (PRINIVIL,ZESTRIL) 2.5 MG tablet Take 2.5 mg by mouth daily.  06/20/17  Yes [provider]  metFORMIN (GLUCOPHAGE) 1000 MG tablet Take 1,000 mg by mouth 2 (two) times daily with a meal.   Yes [provider]  pravastatin (PRAVACHOL) 40 MG tablet Take 40 mg by mouth daily.   Yes [provider]  rOPINIRole (REQUIP) 1 MG tablet Take 1 mg by mouth at bedtime.  04/04/17 04/04/18 Yes [provider]  tamsulosin (FLOMAX) 0.4 MG CAPS capsule TAKE 1 CAPSULE (0.4 MG TOTAL) BY MOUTH ONCE DAILY. TAKE 30 MINUTES AFTER SAME MEAL EACH DAY. 07/04/17  Yes [provider]  tiotropium (SPIRIVA) 18 MCG inhalation capsule Place 18 mcg into inhaler and inhale daily.   Yes [provider]  VICTOZA 18 MG/3ML SOPN Inject 0.9 mg into the skin daily. 08/12/17  Yes [provider]  aspirin EC 81 MG tablet Take 81 mg by mouth daily.    [provider]  cyanocobalamin 1000 MCG tablet Take 1,000 mcg by mouth daily.    [provider]  magnesium gluconate (MAGONATE) 500 MG tablet Take 500 mg by mouth daily.    [provider]    Inpatient Medications:  . allopurinol  100 mg Oral Daily  . aspirin EC  81 mg Oral Daily  . carvedilol  6.25 mg Oral BID WC  . docusate sodium  100 mg Oral Daily  . DULoxetine  30 mg Oral Daily  . furosemide  40 mg Oral Daily  . hydrocortisone sod succinate (SOLU-CORTEF) inj  50 mg Intravenous Q8H  . insulin aspart  0-20 Units Subcutaneous Q4H  . ketorolac  30 mg Intravenous Q6H  . liraglutide  0.9 mg Subcutaneous Daily  . lisinopril  2.5 mg Oral Daily  . magnesium oxide  400 mg Oral Daily  . metFORMIN  1,000 mg Oral BID WC  . pantoprazole (PROTONIX) IV  40 mg Intravenous QHS  . pravastatin  40 mg Oral Daily  . rOPINIRole  1 mg Oral QHS  . tamsulosin  0.4 mg Oral Daily  . tiotropium  18 mcg Inhalation Daily  . cyanocobalamin  1,000 mcg Oral Daily   . 0.9 % NaCl with KCl 20 mEq / L 75 mL/hr at 09/08/17 1439  . phenylephrine (NEO-SYNEPHRINE) Adult infusion 60 mcg/min (09/09/17 6269)     Allergies: No Known Allergies  Social History   Socioeconomic History  . Marital status: Married    Spouse name: Not on file  . Number of children: Not on file  . Years of education: Not on file  . Highest education level: Not on file  Social Needs  . Financial resource strain: Not on file  . Food insecurity - worry: Not on file  . Food insecurity - inability: Not on file  . Transportation needs - medical: Not on file  . Transportation needs - non-medical: Not on file  Occupational History  . Not on file  Tobacco Use  . Smoking status: Former Research scientist (life sciences)  . Smokeless tobacco: Never Used  Substance and Sexual Activity  . Alcohol use: Yes    Comment: seldom  . Drug use: No  . Sexual activity: Not on file  Other Topics Concern  . Not  on file  Social History Narrative  . Not on file     Family History  Problem Relation Age of Onset  . Heart attack Mother   . Cancer Father      Review of Systems Positive for shortness of breath neck pain Negative for: General:  chills, fever, night sweats or weight changes.  Cardiovascular: PND orthopnea syncope dizziness  Dermatological skin lesions rashes Respiratory: Cough congestion Urologic: Frequent urination urination at night and hematuria Abdominal: negative for nausea, vomiting, diarrhea, bright red blood per rectum, melena, or hematemesis Neurologic: negative for visual changes, and/or hearing changes  All other systems reviewed and are otherwise negative except as noted above.  Labs: No results for input(s): CKTOTAL, CKMB, TROPONINI in the last 72 hours. Lab Results  Component Value Date   WBC 5.9 09/08/2017   HGB 11.9 (L) 09/08/2017   HCT 35.9 (L) 09/08/2017   MCV 90.3 09/08/2017   PLT 146 (L) 09/08/2017    Recent Labs  Lab 09/08/17 0655  NA 139  K 4.1  CL 109  CO2 24  BUN 23*  CREATININE 0.99  CALCIUM 9.0  GLUCOSE 224*   No results found for: CHOL, HDL, LDLCALC, TRIG No results found for:  DDIMER  Radiology/Studies:  Dg Chest Port 1 View  Result Date: 09/08/2017 CLINICAL DATA:  Postoperative hypoxia. EXAM: PORTABLE CHEST 1 VIEW COMPARISON:  Chest CT 01/11/2017 FINDINGS: The heart is mildly enlarged but stable. The mediastinal and hilar contours are within normal limits and unchanged. Chronic bronchitic type interstitial changes from interstitial lung disease. No definite acute overlying pulmonary process. Could not exclude small pleural effusions. IMPRESSION: Chronic lung disease without obvious acute overlying pulmonary process except for possible small bilateral pleural effusions. Electronically Signed   By: Marijo Sanes M.D.   On: 09/08/2017 12:40    EKG: Normal sinus rhythm  Weights: Filed Weights   09/08/17 0610 09/08/17 1400  Weight: 250 lb (113.4 kg) 263 lb 0.1 oz (119.3 kg)     Physical Exam: Blood pressure (!) 93/44, pulse 96, temperature 98.6 F (37 C), temperature source Oral, resp. rate 19, height 5\' 7"  (1.702 m), weight 263 lb 0.1 oz (119.3 kg), SpO2 93 %. Body mass index is 41.19 kg/m. General: Well developed, well nourished, in no acute distress. Head eyes ears nose throat: Normocephalic, atraumatic, sclera non-icteric, no xanthomas, nares are without discharge. No apparent thyromegaly and/or mass  Lungs: Normal respiratory effort.  no wheezes, basilar rales, no rhonchi.  Heart: RRR with normal S1 S2. no murmur gallop, no rub, PMI is normal size and placement, carotid upstroke normal without bruit, jugular venous pressure is normal Abdomen: Soft, non-tender, non-distended with normoactive bowel sounds. No hepatomegaly. No rebound/guarding. No obvious abdominal masses. Abdominal aorta is normal size without bruit Extremities: 1+ edema. no cyanosis, no clubbing, no ulcers  Peripheral : 2+ bilateral upper extremity pulses, 2+ bilateral femoral pulses, 0 + bilateral dorsal pedal pulse Neuro: Alert and oriented. No facial asymmetry. No focal deficit. Moves all  extremities spontaneously. Musculoskeletal: Normal muscle tone without kyphosis Psych:  Responds to questions appropriately with a normal affect.    Assessment: 57 year old with known coronary artery disease status post coronary bypass graft peripheral vascular disease with acute on chronic systolic dysfunction congestive heart failure and hypotension without evidence of myocardial infarction  Plan: 1.  Continue Levophed for pressure support but wean as able over the next [id number] with goal systolic blood pressure above 80 systolic 2.  Abstain from use of carvedilol  and lisinopril at this time due to hypotension 3.  Consider echocardiogram for LV systolic dysfunction due to mild acute on chronic systolic dysfunction heart failure from volume load 4.  Oral or intravenous Lasix for mild pulmonary and lower extremity edema with heart failure 5.  Begin ambulation with patient following for need for further adjustments of medication management  Signed, Corey Skains M.D. [facility] 09/09/2017, 5:55 AM

## 2017-09-10 ENCOUNTER — Inpatient Hospital Stay
Admission: RE | Admit: 2017-09-10 | Discharge: 2017-09-10 | Disposition: A | Discharge status: Home / Self Care | Payer: Medicare HMO | Source: Ambulatory Visit | Attending: Internal Medicine | Admitting: Internal Medicine

## 2017-09-10 DIAGNOSIS — I6522 Occlusion and stenosis of left carotid artery: Secondary | ICD-10-CM

## 2017-09-10 DIAGNOSIS — I959 Hypotension, unspecified: Secondary | ICD-10-CM

## 2017-09-10 LAB — MAGNESIUM: MAGNESIUM: 2.2 mg/dL (ref 1.7–2.4)

## 2017-09-10 LAB — BASIC METABOLIC PANEL
Anion gap: 7 (ref 5–15)
BUN: 42 mg/dL — AB (ref 6–20)
CALCIUM: 7.6 mg/dL — AB (ref 8.9–10.3)
CO2: 20 mmol/L — ABNORMAL LOW (ref 22–32)
CREATININE: 1.66 mg/dL — AB (ref 0.61–1.24)
Chloride: 107 mmol/L (ref 101–111)
GFR calc non Af Amer: 44 mL/min — ABNORMAL LOW (ref >60)
GFR, EST AFRICAN AMERICAN: 51 mL/min — AB (ref >60)
Glucose, Bld: 186 mg/dL — ABNORMAL HIGH (ref 65–99)
Potassium: 5.5 mmol/L — ABNORMAL HIGH (ref 3.5–5.1)
Sodium: 134 mmol/L — ABNORMAL LOW (ref 135–145)

## 2017-09-10 LAB — GLUCOSE, CAPILLARY
GLUCOSE-CAPILLARY: 177 mg/dL — AB (ref 65–99)
Glucose-Capillary: 191 mg/dL — ABNORMAL HIGH (ref 65–99)
Glucose-Capillary: 174 mg/dL — ABNORMAL HIGH (ref 65–99)
Glucose-Capillary: 230 mg/dL — ABNORMAL HIGH (ref 65–99)

## 2017-09-10 LAB — PHOSPHORUS: Phosphorus: 4.4 mg/dL (ref 2.5–4.6)

## 2017-09-10 MED ORDER — PERFLUTREN LIPID MICROSPHERE
1.0000 mL | INTRAVENOUS | Status: AC | PRN
  Administered 2017-09-10: 2 mL via INTRAVENOUS
  Filled 2017-09-10: qty 10

## 2017-09-10 MED ORDER — HYDROCORTISONE NA SUCCINATE PF 100 MG IJ SOLR: 50.0000 mg | Freq: Two times a day (BID) | INTRAMUSCULAR | Status: DC

## 2017-09-10 MED ORDER — OXYCODONE HCL 5 MG PO TABS: 5.0000 mg | ORAL_TABLET | ORAL | 0 refills | Status: DC | PRN

## 2017-09-10 NOTE — Progress Notes (Signed)
PULMONARY / CRITICAL CARE MEDICINE   Name: [name] MRN: [id number] DOB: Sep 21, 1960    ADMISSION DATE:  09/08/2017 CONSULTATION DATE:  09/08/2017  REFERRING MD:  Dr. Delana Meyer   HISTORY OF PRESENT ILLNESS:   This is a 57 y/o caucasian male with a PMH as indicated below who presented with severe carotid stenosis; underwent a Leftcarotid endarterectomy with CorMatrix arterial patch reconstruction. Patient became hypotensive post-op requiring fluid boluses and pressors. Patient is still one pressors but reports feeling better.  Patient still has some minimal pain at surgical site. He is finally making urine.   REVIEW OF SYSTEMS:   Mild pain at neck- surgical site  SUBJECTIVE:  Patient reported that he feels well.  VITAL SIGNS: BP (!) 108/55   Pulse 91   Temp (!) 97 F (36.1 C)   Resp (!) 22   Ht 5\' 7"  (1.702 m)   Wt 263 lb 0.1 oz (119.3 kg)   SpO2 (!) 88%   BMI 41.19 kg/m   HEMODYNAMICS:  Weaning Neosynephrine    INTAKE / OUTPUT: I/O last 3 completed shifts: In: 4362.1 [I.V.:4212.1; IV Piggyback:150] Out: 690 [Urine:690]  PHYSICAL EXAMINATION: General:  No Distress Neuro:  Awake, alert x4, no deficits HEENT:  Neck neck incision site clean, no bleeding, no stridor Cardiovascular:  RRR, 1+ oedema B/L lower extremities Lungs:  CTA Abdomen:  Soft, +BS Musculoskeletal:  No deformity or limitation Skin:  Warm and dry  LABS:  BMET Recent Labs  Lab 09/08/17 0655 09/09/17 0523 09/10/17 0528  NA 139 138 134*  K 4.1 4.8 5.5*  CL 109 109 107  CO2 24 25 20*  BUN 23* 23* 42*  CREATININE 0.99 1.49* 1.66*  GLUCOSE 224* 165* 186*    Electrolytes Recent Labs  Lab 09/08/17 0655 09/09/17 0523 09/10/17 0528  CALCIUM 9.0 7.6* 7.6*  MG  --  1.5* 2.2  PHOS  --  4.1 4.4    CBC Recent Labs  Lab 09/08/17 0655 09/09/17 0523  WBC 5.9 10.3  HGB 11.9* 11.1*  HCT 35.9* 34.8*  PLT 146* 150    Coag's Recent Labs  Lab 09/08/17 0655 09/09/17 0523  APTT  28  --   INR 1.04 1.07    Sepsis Markers Recent Labs  Lab 09/09/17 0523  LATICACIDVEN 1.4    ABG Recent Labs  Lab 09/08/17 1234 09/08/17 1325  PHART 7.23* 7.25*  PCO2ART 46 48  PO2ART 58* 54*    Liver Enzymes No results for input(s): AST, ALT, ALKPHOS, BILITOT, ALBUMIN in the last 168 hours.  Cardiac Enzymes Recent Labs  Lab 09/09/17 0523  TROPONINI <0.03    Glucose Recent Labs  Lab 09/09/17 1117 09/09/17 1608 09/10/17 0029 09/10/17 0417 09/10/17 0754 09/10/17 1118  GLUCAP 235* 207* 177* 191* 174* 230*    Imaging No results found.   SIGNIFICANT EVENTS: 2/15 Admit, left CEA   DISCUSSION: This 57 year old gentleman with known CAD with poor LVF is POD #2 of left carotid endarterectomy. He is improving with decreasing vasopressor requirement.  His urinary output has begun to improve.  ASSESSMENT / PLAN:  1. Severe Carotid artery disease- POD# 2 of Carotid endarterectomy 2 . Persistent post op hypotension continue to improve. Continue wean of Phenylephrine with goal to be off today 3. Acute post op Kidney injury 4. Hx CAD with poor LVF Hx COPD  Plan 1. Continue aggressive wean of vasopressor.  2. 2D ECHO pending 3. Incentive spirometry 4. Keep hydrated and monitor UO and  creatinine 5. Avoid Nephrotoxic meds.  6. Continue to ambulate  Patient's family updated.  Cammie Sickle, M.D Pulmonary and [place] Pager: [phone]  09/10/2017, 12:15 PM

## 2017-09-10 NOTE — Progress Notes (Signed)
Neo turned off this morning at 0830.  Patient's VSS.  Patient ambulated around nursing unit with stand by assist and ambulating around in the room independently.   ECHO performed at bedside by tech.  Dr. Trula Slade in to examine and discharge patient.  Dr. Nehemiah Massed called to verify if it was ok to discharge patient before ECHO results came by.  Per Dr. Nehemiah Massed, pt may go home.  Discharge instructions reviewed with patient.  Returned patient's belongings and assisted patient to dress.  PIVs removed and patient discharged via wheelchair to visitor's entrance to go home driven by wife at [phone].

## 2017-09-10 NOTE — Progress Notes (Signed)
[facility] Encounter Note  Patient: [name] / Admit Date: 09/08/2017 / Date of Encounter: 09/10/2017, 7:04 AM   Subjective: No concerns or issues with surgical intervention with left carotid endarterectomy.  No evidence of complication.  Patient has had low blood pressure post surgical intervention but no evidence of significant symptoms.  Patient has been in the chair and moving around without any major issue although currently still on levo fed  Review of Systems: Positive for: None Negative for: Vision change, hearing change, syncope, dizziness, nausea, vomiting,diarrhea, bloody stool, stomach pain, cough, congestion, diaphoresis, urinary frequency, urinary pain,skin lesions, skin rashes Others previously listed  Objective: Telemetry: Normal sinus rhythm Physical Exam: Blood pressure (!) 106/59, pulse 80, temperature 98.2 F (36.8 C), temperature source Oral, resp. rate 15, height 5\' 7"  (1.702 m), weight 263 lb 0.1 oz (119.3 kg), SpO2 94 %. Body mass index is 41.19 kg/m. General: Well developed, well nourished, in no acute distress. Head: Normocephalic, atraumatic, sclera non-icteric, no xanthomas, nares are without discharge. Neck: No apparent masses Lungs: Normal respirations with no wheezes, no rhonchi, no rales , no crackles   Heart: Regular rate and rhythm, normal S1 S2, no murmur, no rub, no gallop, PMI is normal size and placement, carotid upstroke normal without bruit, jugular venous pressure normal Abdomen: Soft, non-tender, non-distended with normoactive bowel sounds. No hepatosplenomegaly. Abdominal aorta is normal size without bruit Extremities: No edema, no clubbing, no cyanosis, no ulcers,  Peripheral: 2+ radial, 2+ femoral, 2+ dorsal pedal pulses Neuro: Alert and oriented. Moves all extremities spontaneously. Psych:  Responds to questions appropriately with a normal affect.   Intake/Output Summary (Last 24 hours) at 09/10/2017 0704 Last  data filed at 09/10/2017 0600 Gross per 24 hour  Intake 2079.63 ml  Output 400 ml  Net 1679.63 ml    Inpatient Medications:  . allopurinol  100 mg Oral Daily  . aspirin EC  81 mg Oral Daily  . docusate sodium  100 mg Oral Daily  . DULoxetine  30 mg Oral Daily  . furosemide  40 mg Oral Daily  . hydrocortisone sod succinate (SOLU-CORTEF) inj  50 mg Intravenous Q8H  . insulin aspart  0-20 Units Subcutaneous Q4H  . liraglutide  0.9 mg Subcutaneous Daily  . magnesium oxide  400 mg Oral Daily  . metFORMIN  1,000 mg Oral BID WC  . pantoprazole  40 mg Oral QHS  . pravastatin  40 mg Oral Daily  . rOPINIRole  1 mg Oral QHS  . tamsulosin  0.4 mg Oral Daily  . tiotropium  18 mcg Inhalation Daily  . cyanocobalamin  1,000 mcg Oral Daily   Infusions:  . phenylephrine (NEO-SYNEPHRINE) Adult infusion 10 mcg/min (09/10/17 0600)    Labs: Recent Labs    09/09/17 0523 09/10/17 0528  NA 138 134*  K 4.8 5.5*  CL 109 107  CO2 25 20*  GLUCOSE 165* 186*  BUN 23* 42*  CREATININE 1.49* 1.66*  CALCIUM 7.6* 7.6*  MG 1.5* 2.2  PHOS 4.1 4.4   No results for input(s): AST, ALT, ALKPHOS, BILITOT, PROT, ALBUMIN in the last 72 hours. Recent Labs    09/08/17 0655 09/09/17 0523  WBC 5.9 10.3  NEUTROABS 3.6  --   HGB 11.9* 11.1*  HCT 35.9* 34.8*  MCV 90.3 92.6  PLT 146* 150   Recent Labs    09/09/17 0523  TROPONINI <0.03   Invalid input(s): POCBNP No results for input(s): HGBA1C in the last 72 hours.  Weights: Filed Weights   09/08/17 0610 09/08/17 1400  Weight: 250 lb (113.4 kg) 263 lb 0.1 oz (119.3 kg)     Radiology/Studies:  Dg Chest Port 1 View  Result Date: 09/09/2017 CLINICAL DATA:  Hypotension. EXAM: PORTABLE CHEST 1 VIEW COMPARISON:  Yesterday. FINDINGS: Lower lung volumes from prior exam. Post median sternotomy. Unchanged cardiomegaly. Chronic bronchitic changes. Possible small pleural effusions again seen. Body habitus limits detailed assessment. IMPRESSION: Unchanged  cardiomegaly and bronchitic change. Possible small pleural effusions. Electronically Signed   By: Jeb Levering M.D.   On: 09/09/2017 06:16   Dg Chest Port 1 View  Result Date: 09/08/2017 CLINICAL DATA:  Postoperative hypoxia. EXAM: PORTABLE CHEST 1 VIEW COMPARISON:  Chest CT 01/11/2017 FINDINGS: The heart is mildly enlarged but stable. The mediastinal and hilar contours are within normal limits and unchanged. Chronic bronchitic type interstitial changes from interstitial lung disease. No definite acute overlying pulmonary process. Could not exclude small pleural effusions. IMPRESSION: Chronic lung disease without obvious acute overlying pulmonary process except for possible small bilateral pleural effusions. Electronically Signed   By: Marijo Sanes M.D.   On: 09/08/2017 12:40     Assessment and Recommendation  57 y.o. male with known cardiovascular disease essential hypertension mixed hyperlipidemia status post left carotid endarterectomy with hypotension and no other complication 1.  Discontinuation of Levophed today due to significant improvements overnight and patient without symptoms today 2.  Abstain from beta-blocker and ACE inhibitor due to hypotension 3.  Continuation of antiplatelet medication management and high intensity cholesterol therapy 4.  No further cardiac diagnostics necessary at this time 5.  Begin ambulation and follow for improvements of symptoms and possible discharge home if no further symptoms and follow-up next week for possible reinstatement of outpatient medications  Signed, Serafina Royals M.D. FACC

## 2017-09-11 ENCOUNTER — Encounter: Payer: Self-pay | Admitting: Vascular Surgery

## 2017-09-11 LAB — SURGICAL PATHOLOGY

## 2017-09-11 LAB — GLUCOSE, CAPILLARY: GLUCOSE-CAPILLARY: 209 mg/dL — AB (ref 65–99)

## 2017-09-14 ENCOUNTER — Other Ambulatory Visit: Payer: Self-pay

## 2017-09-14 NOTE — Patient Outreach (Signed)
[facility]) Care Management  09/14/2017  [name] October 24, 1960 [id number]  Transition of care  Referral date: 09/13/17 Referral source: discharged from [facility] 09/10/17 Insurance: [employer]  Attempt #1  Telephone call to patient regarding transition of care referral. Unable to reach patient. Contact answer phone states patient is not in.  HIPAA compliant voice message left with call back phone number.  PLAN; RNCM will attempt 2nd telephone call to patient within 3 business days.  [name] RN,BSN,CCM [facility] Telephonic  [phone]

## 2017-09-15 ENCOUNTER — Other Ambulatory Visit: Payer: Self-pay

## 2017-09-15 NOTE — Patient Outreach (Signed)
[facility]) Care Management  09/15/2017  [name] 1960-11-01 [id number]   Transition of care  Referral date: 09/13/17 Referral source: discharged from [facility] 09/10/17 Insurance: [employer]  Attempt #2  Telephone call to patient regarding transition of care referral. Unable to reach patient. Contact answer phone states patient is not in.  HIPAA compliant voice message left with call back phone number.  PLAN; RNCM will send patient outreach letter to attempt contact.   [name] RN,BSN,CCM [facility] Telephonic  [phone]

## 2017-09-19 ENCOUNTER — Telehealth (INDEPENDENT_AMBULATORY_CARE_PROVIDER_SITE_OTHER): Payer: Self-pay | Admitting: Vascular Surgery

## 2017-09-19 NOTE — Telephone Encounter (Signed)
Wife calling to inquire about what his follow up should be after procedure 2/15. I looked at the summary and there was no orders for any appts or anything. She also would like to know when the surgery "on his other side" will be taking place. I advised her that I would forward these questions and someone would get back to her. She is aware that GS is not in office Thursday.

## 2017-09-20 NOTE — Discharge Summary (Signed)
[facility]    Discharge Summary    Patient ID:  [name] MRN: [id number] DOB/AGE: 1961/04/26 57 y.o.  Admit date: 09/08/2017 Discharge date: 09/20/2017 Date of Surgery: 09/08/2017 Surgeon: Surgeon(s): Vrishank Moster, Dolores Lory, MD  Admission Diagnosis: CAROTID ARTERY STENOSIS  Discharge Diagnoses:  CAROTID ARTERY STENOSIS  Secondary Diagnoses: Past Medical History:  Diagnosis Date  . Cancer ([place])    skin (scalp)  . CHF (congestive heart failure) ([place])   . COPD (chronic obstructive pulmonary disease) ([place])   . Coronary artery disease   . Diabetes mellitus without complication ([place])   . MI (myocardial infarction) ([place])     Procedure(s): ENDARTERECTOMY CAROTID  Discharged Condition: good  HPI:  On the day of admission the patient underwent a successful left carotid endarterectomy with core matrix patch.  Surgery was without complication.  His postoperative course was normal.  He was discharged home on postoperative day #1  Hospital Course:  [name] is a 57 y.o. male is S/P Left Procedure(s): ENDARTERECTOMY CAROTID Extubated: POD # 0 Physical exam: Left neck clean dry and intact neurologic exam normal Post-op wounds clean, dry, intact or healing well Pt. Ambulating, voiding and taking PO diet without difficulty. Pt pain controlled with PO pain meds. Labs as below Complications:none  Consults:  Treatment Team:  Lafayette Dragon, MD  Significant Diagnostic Studies: CBC Lab Results  Component Value Date   WBC 10.3 09/09/2017   HGB 11.1 (L) 09/09/2017   HCT 34.8 (L) 09/09/2017   MCV 92.6 09/09/2017   PLT 150 09/09/2017    BMET    Component Value Date/Time   NA 134 (L) 09/10/2017 0528   NA 134 (L) 10/22/2013 0507   K 5.5 (H) 09/10/2017 0528   K 4.5 10/22/2013 0507   CL 107 09/10/2017 0528   CL 101 10/22/2013 0507   CO2 20 (L) 09/10/2017 0528   CO2 28 10/22/2013 0507   GLUCOSE 186 (H) 09/10/2017 0528   GLUCOSE 300  (H) 10/22/2013 0507   BUN 42 (H) 09/10/2017 0528   BUN 32 (H) 10/22/2013 0507   CREATININE 1.66 (H) 09/10/2017 0528   CREATININE 1.18 10/22/2013 0507   CALCIUM 7.6 (L) 09/10/2017 0528   CALCIUM 8.0 (L) 10/22/2013 0507   GFRNONAA 44 (L) 09/10/2017 0528   GFRNONAA >60 10/22/2013 0507   GFRAA 51 (L) 09/10/2017 0528   GFRAA >60 10/22/2013 0507   COAG Lab Results  Component Value Date   INR 1.07 09/09/2017   INR 1.04 09/08/2017   INR 0.87 07/31/2017     Disposition:  Discharge to :Home Discharge Instructions    Call MD for:  redness, tenderness, or signs of infection (pain, swelling, bleeding, redness, odor or green/yellow discharge around incision site)   Complete by:  As directed    Call MD for:  severe or increased pain, loss or decreased feeling  in affected limb(s)   Complete by:  As directed    Call MD for:  temperature >100.5   Complete by:  As directed    Driving Restrictions   Complete by:  As directed    No driving for 2 weeks   Lifting restrictions   Complete by:  As directed    No lifting for 2 weeks   Resume previous diet   Complete by:  As directed      Allergies as of 09/10/2017   No Known Allergies     Medication List    TAKE these medications  acetaminophen 500 MG tablet Commonly known as:  TYLENOL Take 1,000-1,500 mg by mouth every 4 (four) hours as needed for moderate pain or headache.   albuterol 108 (90 Base) MCG/ACT inhaler Commonly known as:  PROVENTIL HFA;VENTOLIN HFA Inhale 2 puffs into the lungs every 6 (six) hours as needed for wheezing or shortness of breath.   allopurinol 100 MG tablet Commonly known as:  ZYLOPRIM Take 100 mg by mouth daily.   aspirin EC 81 MG tablet Take 81 mg by mouth daily.   carvedilol 6.25 MG tablet Commonly known as:  COREG Take 6.25 mg by mouth 2 (two) times daily with a meal.   cyanocobalamin 1000 MCG tablet Take 1,000 mcg by mouth daily.   DULoxetine 30 MG capsule Commonly known as:  CYMBALTA TAKE  1 CAPSULE (30 MG TOTAL) BY MOUTH ONCE DAILY.   furosemide 40 MG tablet Commonly known as:  LASIX Take 40 mg by mouth daily.   glipiZIDE 10 MG 24 hr tablet Commonly known as:  GLUCOTROL XL Take 10 mg by mouth 2 (two) times daily.   HYDROcodone-acetaminophen 7.5-325 MG tablet Commonly known as:  NORCO Take 1 tablet by mouth at bedtime.   insulin NPH-regular Human (70-30) 100 UNIT/ML injection Commonly known as:  NOVOLIN 70/30 Inject 35 Units into the skin 2 (two) times daily with a meal.   lisinopril 2.5 MG tablet Commonly known as:  PRINIVIL,ZESTRIL Take 2.5 mg by mouth daily.   magnesium gluconate 500 MG tablet Commonly known as:  MAGONATE Take 500 mg by mouth daily.   metFORMIN 1000 MG tablet Commonly known as:  GLUCOPHAGE Take 1,000 mg by mouth 2 (two) times daily with a meal.   oxyCODONE 5 MG immediate release tablet Commonly known as:  Oxy IR/ROXICODONE Take 1-2 tablets (5-10 mg total) by mouth every 4 (four) hours as needed for moderate pain.   pravastatin 40 MG tablet Commonly known as:  PRAVACHOL Take 40 mg by mouth daily.   rOPINIRole 1 MG tablet Commonly known as:  REQUIP Take 1 mg by mouth at bedtime.   tamsulosin 0.4 MG Caps capsule Commonly known as:  FLOMAX TAKE 1 CAPSULE (0.4 MG TOTAL) BY MOUTH ONCE DAILY. TAKE 30 MINUTES AFTER SAME MEAL EACH DAY.   tiotropium 18 MCG inhalation capsule Commonly known as:  SPIRIVA Place 18 mcg into inhaler and inhale daily.   VICTOZA 18 MG/3ML Sopn Generic drug:  liraglutide Inject 0.9 mg into the skin daily.      Verbal and written Discharge instructions given to the patient. Wound care per Discharge AVS   Signed: Hortencia Pilar, MD  09/20/2017, 2:18 PM

## 2017-09-20 NOTE — Telephone Encounter (Signed)
As per the normal routine the first postoperative visit is approximately 1-2 weeks after surgery for an incision check.  Once the patient comes in for his first follow-up we can discuss scheduling him for the other side.

## 2017-09-20 NOTE — Telephone Encounter (Signed)
Patient coming on 09/22/17 for follow up appointment

## 2017-09-22 ENCOUNTER — Ambulatory Visit (INDEPENDENT_AMBULATORY_CARE_PROVIDER_SITE_OTHER): Payer: Medicare HMO | Admitting: Vascular Surgery

## 2017-09-22 ENCOUNTER — Encounter (INDEPENDENT_AMBULATORY_CARE_PROVIDER_SITE_OTHER): Payer: Self-pay | Admitting: Vascular Surgery

## 2017-09-22 VITALS — BP 119/78 | HR 78 | Resp 17 | Wt 263.0 lb

## 2017-09-22 DIAGNOSIS — E1159 Type 2 diabetes mellitus with other circulatory complications: Secondary | ICD-10-CM

## 2017-09-22 DIAGNOSIS — I6523 Occlusion and stenosis of bilateral carotid arteries: Secondary | ICD-10-CM

## 2017-09-22 DIAGNOSIS — E785 Hyperlipidemia, unspecified: Secondary | ICD-10-CM

## 2017-09-22 NOTE — Progress Notes (Signed)
Subjective:    Patient ID: [name], male    DOB: 1960-12-26, 57 y.o.   MRN: [id number] Chief Complaint  Patient presents with  . Routine Post Op    2week Left CEA   The patient presents for his first postoperative follow-up.  The patient is status post a left carotid endarterectomy on September 08, 2017.  The patient had a relatively unremarkable postoperative course with the exception of some hypotension for which she stayed an extra night in the hospital.  The patient presents today without complaint. The patient denies experiencing Amaurosis Fugax, TIA like symptoms or focal motor deficits.  Denies any issues with his incision.  Denies any fever, nausea vomiting.  The patient also has greater than 75% stenosis of the right internal carotid artery.  And is to take patient back to the OR and have him undergo a right carotid endarterectomy approximately 6-8 weeks after his initial surgery.   Review of Systems  Constitutional: Negative.   HENT: Negative.   Eyes: Negative.   Respiratory: Negative.   Cardiovascular: Negative.   Gastrointestinal: Negative.   Endocrine: Negative.   Genitourinary: Negative.   Musculoskeletal: Negative.   Skin: Negative.   Allergic/Immunologic: Negative.   Neurological: Negative.   Hematological: Negative.   Psychiatric/Behavioral: Negative.       Objective:   Physical Exam  Constitutional: He is oriented to person, place, and time. He appears well-developed and well-nourished. No distress.  HENT:  Head: Normocephalic and atraumatic.  Eyes: Conjunctivae are normal. Pupils are equal, round, and reactive to light.  Neck: Normal range of motion.  Left carotid endarterectomy incision: Clean dry and intact. No signs of infection noted.  Right sided bruit noted.  Cardiovascular: Normal rate, regular rhythm, normal heart sounds and intact distal pulses.  Pulses:      Radial pulses are 2+ on the right side, and 2+ on the left side.  Pulmonary/Chest:  Effort normal and breath sounds normal.  Musculoskeletal: Normal range of motion. He exhibits no edema.  Neurological: He is alert and oriented to person, place, and time.  Skin: Skin is warm and dry. He is not diaphoretic.  Psychiatric: He has a normal mood and affect. His behavior is normal. Judgment and thought content normal.  Vitals reviewed.  BP 119/78 (BP Location: Right Arm)   Pulse 78   Resp 17   Wt 263 lb (119.3 kg)   BMI 41.19 kg/m   Past Medical History:  Diagnosis Date  . Cancer ([place])    skin (scalp)  . CHF (congestive heart failure) ([place])   . COPD (chronic obstructive pulmonary disease) ([place])   . Coronary artery disease   . Diabetes mellitus without complication ([place])   . MI (myocardial infarction) [facility])    Social History   Socioeconomic History  . Marital status: Married    Spouse name: Not on file  . Number of children: Not on file  . Years of education: Not on file  . Highest education level: Not on file  Social Needs  . Financial resource strain: Not on file  . Food insecurity - worry: Not on file  . Food insecurity - inability: Not on file  . Transportation needs - medical: Not on file  . Transportation needs - non-medical: Not on file  Occupational History  . Not on file  Tobacco Use  . Smoking status: Former Research scientist (life sciences)  . Smokeless tobacco: Never Used  Substance and Sexual Activity  . Alcohol use: Yes  Comment: seldom  . Drug use: No  . Sexual activity: Not on file  Other Topics Concern  . Not on file  Social History Narrative  . Not on file   Past Surgical History:  Procedure Laterality Date  . APPLICATION OF WOUND VAC     chest wound s/p CABG  . CARDIAC CATHETERIZATION    . CORONARY ARTERY BYPASS GRAFT  2015   3 vessels  . ENDARTERECTOMY Left 09/08/2017   Procedure: ENDARTERECTOMY CAROTID;  Surgeon: Katha Cabal, MD;  Location: [facility] ORS;  Service: Vascular;  Laterality: Left;  . PERIPHERAL VASCULAR CATHETERIZATION N/A 06/09/2015     Procedure: Abdominal Aortogram w/Lower Extremity;  Surgeon: Katha Cabal, MD;  Location: [place] CV LAB;  Service: Cardiovascular;  Laterality: N/A;  . PERIPHERAL VASCULAR CATHETERIZATION  06/09/2015   Procedure: Lower Extremity Intervention;  Surgeon: Katha Cabal, MD;  Location: [place] CV [facility];  Service: Cardiovascular;;  . stent in leg Left   . WOUND DEBRIDEMENT     debridement of chest wound x 2 s/p CABG    Family History  Problem Relation Age of Onset  . Heart attack Mother   . Cancer Father    No Known Allergies     Assessment & Plan:  The patient presents for his first postoperative follow-up.  The patient is status post a left carotid endarterectomy on September 08, 2017.  The patient had a relatively unremarkable postoperative course with the exception of some hypotension for which she stayed an extra night in the hospital.  The patient presents today without complaint. The patient denies experiencing Amaurosis Fugax, TIA like symptoms or focal motor deficits.  Denies any issues with his incision.  Denies any fever, nausea vomiting.  The patient also has greater than 75% stenosis of the right internal carotid artery.  And is to take patient back to the OR and have him undergo a right carotid endarterectomy approximately 6-8 weeks after his initial surgery.  1. Bilateral carotid artery stenosis - Stable Patient presents for his first postoperative follow-up. The patient's postoperative course has been unremarkable The patient has right sided internal carotid artery stenosis which we will plan on a right carotid endarterectomy in approximately 6-8 weeks We will bring the patient back in 1 month to undergo a bilateral carotid ultrasound  - VAS US CAROTID; Future  2. Type 2 diabetes mellitus with other circulatory complication, without long-term current use of insulin (HCC) - Stable Encouraged good control as its slows the progression of atherosclerotic  disease  3. Hyperlipidemia, unspecified hyperlipidemia type - Stable Encouraged good control as its slows the progression of atherosclerotic disease  Current Outpatient Medications on File Prior to Visit  Medication Sig Dispense Refill  . acetaminophen (TYLENOL) 500 MG tablet Take 1,000-1,500 mg by mouth every 4 (four) hours as needed for moderate pain or headache.     . albuterol (PROVENTIL HFA;VENTOLIN HFA) 108 (90 Base) MCG/ACT inhaler Inhale 2 puffs into the lungs every 6 (six) hours as needed for wheezing or shortness of breath.    . allopurinol (ZYLOPRIM) 100 MG tablet Take 100 mg by mouth daily.  5  . aspirin EC 81 MG tablet Take 81 mg by mouth daily.    . carvedilol (COREG) 6.25 MG tablet Take 6.25 mg by mouth 2 (two) times daily with a meal.    . cyanocobalamin 1000 MCG tablet Take 1,000 mcg by mouth daily.    . DULoxetine (CYMBALTA) 30 MG capsule TAKE 1 CAPSULE (  30 MG TOTAL) BY MOUTH ONCE DAILY.  1  . furosemide (LASIX) 40 MG tablet Take 40 mg by mouth daily.    Marland Kitchen glipiZIDE (GLUCOTROL XL) 10 MG 24 hr tablet Take 10 mg by mouth 2 (two) times daily.     Marland Kitchen HYDROcodone-acetaminophen (NORCO) 7.5-325 MG tablet Take 1 tablet by mouth at bedtime.     . insulin NPH-regular Human (NOVOLIN 70/30) (70-30) 100 UNIT/ML injection Inject 35 Units into the skin 2 (two) times daily with a meal.     . lisinopril (PRINIVIL,ZESTRIL) 2.5 MG tablet Take 2.5 mg by mouth daily.     . magnesium gluconate (MAGONATE) 500 MG tablet Take 500 mg by mouth daily.    . metFORMIN (GLUCOPHAGE) 1000 MG tablet Take 1,000 mg by mouth 2 (two) times daily with a meal.    . oxyCODONE (OXY IR/ROXICODONE) 5 MG immediate release tablet Take 1-2 tablets (5-10 mg total) by mouth every 4 (four) hours as needed for moderate pain. 10 tablet 0  . pravastatin (PRAVACHOL) 40 MG tablet Take 40 mg by mouth daily.    Marland Kitchen rOPINIRole (REQUIP) 1 MG tablet Take 1 mg by mouth at bedtime.     . tamsulosin (FLOMAX) 0.4 MG CAPS capsule TAKE 1  CAPSULE (0.4 MG TOTAL) BY MOUTH ONCE DAILY. TAKE 30 MINUTES AFTER SAME MEAL EACH DAY.  3  . tiotropium (SPIRIVA) 18 MCG inhalation capsule Place 18 mcg into inhaler and inhale daily.    Marland Kitchen VICTOZA 18 MG/3ML SOPN Inject 0.9 mg into the skin daily.     No current facility-administered medications on file prior to visit.    There are no Patient Instructions on file for this visit. No Follow-up on file.  Nicholad Kautzman A Dianely Krehbiel, PA-C

## 2017-09-26 DIAGNOSIS — R0602 Shortness of breath: Secondary | ICD-10-CM | POA: Diagnosis not present

## 2017-09-28 ENCOUNTER — Other Ambulatory Visit: Payer: Self-pay

## 2017-09-28 NOTE — Patient Outreach (Signed)
[facility]) Care Management  09/28/2017  DAROL CUSH Nov 30, 1960 [id number]   Transition of care  Referral date:09/13/17 Referral source:discharged from [facility] 09/10/17 Insurance:[employer]  Attempt #3  Telephone call to patient regardingtransition of care referral.Unable to reach patient.HPAA compliant voice message left with call back phone number.   PLAN; RNCM will refer patient to care management assistant to close patient due to being unable to reach.  RNCM will send patient primary MD notification of closure.   [name] RN,BSN,CCM [facility] Telephonic  [phone]

## 2017-10-18 DIAGNOSIS — I739 Peripheral vascular disease, unspecified: Secondary | ICD-10-CM | POA: Diagnosis not present

## 2017-10-18 DIAGNOSIS — I429 Cardiomyopathy, unspecified: Secondary | ICD-10-CM | POA: Diagnosis not present

## 2017-10-18 DIAGNOSIS — I6529 Occlusion and stenosis of unspecified carotid artery: Secondary | ICD-10-CM | POA: Diagnosis not present

## 2017-10-18 DIAGNOSIS — I214 Non-ST elevation (NSTEMI) myocardial infarction: Secondary | ICD-10-CM | POA: Diagnosis not present

## 2017-10-18 DIAGNOSIS — I519 Heart disease, unspecified: Secondary | ICD-10-CM | POA: Diagnosis not present

## 2017-10-18 DIAGNOSIS — I4891 Unspecified atrial fibrillation: Secondary | ICD-10-CM | POA: Diagnosis not present

## 2017-10-18 DIAGNOSIS — I2510 Atherosclerotic heart disease of native coronary artery without angina pectoris: Secondary | ICD-10-CM | POA: Diagnosis not present

## 2017-10-18 DIAGNOSIS — I9789 Other postprocedural complications and disorders of the circulatory system, not elsewhere classified: Secondary | ICD-10-CM | POA: Diagnosis not present

## 2017-10-26 ENCOUNTER — Ambulatory Visit (INDEPENDENT_AMBULATORY_CARE_PROVIDER_SITE_OTHER): Payer: Medicare HMO

## 2017-10-26 ENCOUNTER — Encounter (INDEPENDENT_AMBULATORY_CARE_PROVIDER_SITE_OTHER): Payer: Self-pay | Admitting: Vascular Surgery

## 2017-10-26 ENCOUNTER — Ambulatory Visit (INDEPENDENT_AMBULATORY_CARE_PROVIDER_SITE_OTHER): Payer: Medicare HMO | Admitting: Vascular Surgery

## 2017-10-26 VITALS — BP 152/87 | HR 88 | Resp 17 | Ht 67.0 in | Wt 254.0 lb

## 2017-10-26 DIAGNOSIS — I6523 Occlusion and stenosis of bilateral carotid arteries: Secondary | ICD-10-CM

## 2017-10-26 DIAGNOSIS — E785 Hyperlipidemia, unspecified: Secondary | ICD-10-CM

## 2017-10-26 DIAGNOSIS — E1159 Type 2 diabetes mellitus with other circulatory complications: Secondary | ICD-10-CM

## 2017-10-26 NOTE — Progress Notes (Signed)
Subjective:    Patient ID: [name], male    DOB: 02/07/1961, 57 y.o.   MRN: [id number] Chief Complaint  Patient presents with  . Carotid    1 month carotid   The patient presents for his first month postop follow-up.  The patient is status post a left carotid endarterectomy on September 08, 2017.  The patient presents today without complaint. The patient denies experiencing Amaurosis Fugax, TIA like symptoms or focal motor deficits.  The patient denies any issues with his incision.  The patient underwent a bilateral carotid duplex which was notable for a patent left carotid endarterectomy with no ICA stenosis.  There is significant improvement of the left ICA flow when compared to the previous duplex conducted on July 10, 2017.  Right internal carotid artery with 1-39% stenosis.  Approximately 70% stenosis to the left common carotid artery bulb.  Right vertebral artery demonstrates antegrade flow.  The antegrade left vertebral artery demonstrates early systolic deceleration.  Dermal flow dynamics were seen in the bilateral subclavian arteries with some stenotic flow approximately in the left.  The patient denies any fever, nausea vomiting.  Review of Systems  Constitutional: Negative.   HENT: Negative.   Eyes: Negative.   Respiratory: Negative.   Cardiovascular: Negative.   Gastrointestinal: Negative.   Endocrine: Negative.   Genitourinary: Negative.   Musculoskeletal: Negative.   Skin: Negative.   Allergic/Immunologic: Negative.   Neurological: Negative.   Hematological: Negative.   Psychiatric/Behavioral: Negative.       Objective:   Physical Exam  Constitutional: He is oriented to person, place, and time. He appears well-developed and well-nourished. No distress.  HENT:  Head: Normocephalic and atraumatic.  Eyes: Pupils are equal, round, and reactive to light. Conjunctivae are normal.  Neck: Normal range of motion.  Mild right carotid bruit noted.  No bruits on the  left side.  Cardiovascular: Normal rate, regular rhythm and normal heart sounds.  Pulses:      Radial pulses are 2+ on the right side, and 2+ on the left side.  Pulmonary/Chest: Effort normal.  Musculoskeletal: Normal range of motion. He exhibits no edema.  Neurological: He is alert and oriented to person, place, and time.  Skin: Skin is warm and dry. He is not diaphoretic.  Incision is healing well.  Psychiatric: He has a normal mood and affect. His behavior is normal. Judgment and thought content normal.  Vitals reviewed.  BP (!) 152/87 (BP Location: Right Arm, Patient Position: Sitting)   Pulse 88   Resp 17   Ht 5\' 7"  (1.702 m)   Wt 254 lb (115.2 kg)   BMI 39.78 kg/m   Past Medical History:  Diagnosis Date  . Cancer ([place])    skin (scalp)  . CHF (congestive heart failure) ([place])   . COPD (chronic obstructive pulmonary disease) ([place])   . Coronary artery disease   . Diabetes mellitus without complication ([place])   . MI (myocardial infarction) [facility])    Social History   Socioeconomic History  . Marital status: Married    Spouse name: Not on file  . Number of children: Not on file  . Years of education: Not on file  . Highest education level: Not on file  Occupational History  . Not on file  Social Needs  . Financial resource strain: Not on file  . Food insecurity:    Worry: Not on file    Inability: Not on file  . Transportation needs:    Medical:  Not on file    Non-medical: Not on file  Tobacco Use  . Smoking status: Former Research scientist (life sciences)  . Smokeless tobacco: Never Used  Substance and Sexual Activity  . Alcohol use: Yes    Comment: seldom  . Drug use: No  . Sexual activity: Not on file  Lifestyle  . Physical activity:    Days per week: Not on file    Minutes per session: Not on file  . Stress: Not on file  Relationships  . Social connections:    Talks on phone: Not on file    Gets together: Not on file    Attends religious service: Not on file    Active member  of club or organization: Not on file    Attends meetings of clubs or organizations: Not on file    Relationship status: Not on file  . Intimate partner violence:    Fear of current or ex partner: Not on file    Emotionally abused: Not on file    Physically abused: Not on file    Forced sexual activity: Not on file  Other Topics Concern  . Not on file  Social History Narrative  . Not on file   Past Surgical History:  Procedure Laterality Date  . APPLICATION OF WOUND VAC     chest wound s/p CABG  . CARDIAC CATHETERIZATION    . CORONARY ARTERY BYPASS GRAFT  2015   3 vessels  . ENDARTERECTOMY Left 09/08/2017   Procedure: ENDARTERECTOMY CAROTID;  Surgeon: Katha Cabal, MD;  Location: [facility] ORS;  Service: Vascular;  Laterality: Left;  . PERIPHERAL VASCULAR CATHETERIZATION N/A 06/09/2015   Procedure: Abdominal Aortogram w/Lower Extremity;  Surgeon: Katha Cabal, MD;  Location: [place] [facility];  Service: Cardiovascular;  Laterality: N/A;  . PERIPHERAL VASCULAR CATHETERIZATION  06/09/2015   Procedure: Lower Extremity Intervention;  Surgeon: Katha Cabal, MD;  Location: [place] [facility];  Service: Cardiovascular;;  . stent in leg Left   . WOUND DEBRIDEMENT     debridement of chest wound x 2 s/p CABG    Family History  Problem Relation Age of Onset  . Heart attack Mother   . Cancer Father    No Known Allergies     Assessment & Plan:  The patient presents for his first month postop follow-up.  The patient is status post a left carotid endarterectomy on September 08, 2017.  The patient presents today without complaint. The patient denies experiencing Amaurosis Fugax, TIA like symptoms or focal motor deficits.  The patient denies any issues with his incision.  The patient underwent a bilateral carotid duplex which was notable for a patent left carotid endarterectomy with no ICA stenosis.  There is significant improvement of the left ICA flow when compared to the  previous duplex conducted on July 10, 2017.  Right internal carotid artery with 1-39% stenosis.  Approximately 70% stenosis to the left common carotid artery bulb.  Right vertebral artery demonstrates antegrade flow.  The antegrade left vertebral artery demonstrates early systolic deceleration.  Dermal flow dynamics were seen in the bilateral subclavian arteries with some stenotic flow approximately in the left.  The patient denies any fever, nausea vomiting.  1. Bilateral carotid artery stenosis - Stable Patient is status post a left carotid endarterectomy on September 08, 2017 The patient's postoperative course has been unremarkable The patient's first postoperative carotid duplex with a patent left carotid endarterectomy and right internal carotid artery stenosis 1-39% with 70% common bulb  stenosis. At this time, there is no indication for intervention to the right internal carotid artery.  We will continue to watch this. The patient is to follow-up in 3 months for repeat carotid duplex I have discussed with the patient at length the risk factors for and pathogenesis of atherosclerotic disease and encouraged a healthy diet, regular exercise regimen and blood pressure / glucose control.  Patient was instructed to contact our office in the interim with problems such as arm / leg weakness or numbness, speech / swallowing difficulty or temporary monocular blindness. The patient expresses their understanding.   - VAS US CAROTID; Future  2. Type 2 diabetes mellitus with other circulatory complication, without long-term current use of insulin (HCC) - Stable Encouraged good control as its slows the progression of atherosclerotic disease  3. Hyperlipidemia, unspecified hyperlipidemia type - Stable Encouraged good control as its slows the progression of atherosclerotic disease  Current Outpatient Medications on File Prior to Visit  Medication Sig Dispense Refill  . acetaminophen (TYLENOL) 500 MG  tablet Take 1,000-1,500 mg by mouth every 4 (four) hours as needed for moderate pain or headache.     . albuterol (PROVENTIL HFA;VENTOLIN HFA) 108 (90 Base) MCG/ACT inhaler Inhale 2 puffs into the lungs every 6 (six) hours as needed for wheezing or shortness of breath.    . allopurinol (ZYLOPRIM) 100 MG tablet Take 100 mg by mouth daily.  5  . aspirin EC 81 MG tablet Take 81 mg by mouth daily.    Marland Kitchen BREO ELLIPTA 100-25 MCG/INH AEPB INHALE 1 INHALATION INTO THE LUNGS ONCE DAILY  6  . carvedilol (COREG) 6.25 MG tablet Take 6.25 mg by mouth 2 (two) times daily with a meal.    . cyanocobalamin 1000 MCG tablet Take 1,000 mcg by mouth daily.    . DULoxetine (CYMBALTA) 30 MG capsule TAKE 1 CAPSULE (30 MG TOTAL) BY MOUTH ONCE DAILY.  1  . furosemide (LASIX) 40 MG tablet Take 40 mg by mouth daily.    Marland Kitchen glipiZIDE (GLUCOTROL XL) 10 MG 24 hr tablet Take 10 mg by mouth 2 (two) times daily.     Marland Kitchen HYDROcodone-acetaminophen (NORCO) 7.5-325 MG tablet Take 1 tablet by mouth at bedtime.     . insulin NPH-regular Human (NOVOLIN 70/30) (70-30) 100 UNIT/ML injection Inject 35 Units into the skin 2 (two) times daily with a meal.     . lisinopril (PRINIVIL,ZESTRIL) 2.5 MG tablet Take 2.5 mg by mouth daily.     . magnesium gluconate (MAGONATE) 500 MG tablet Take 500 mg by mouth daily.    . metFORMIN (GLUCOPHAGE) 1000 MG tablet Take 1,000 mg by mouth 2 (two) times daily with a meal.    . oxyCODONE (OXY IR/ROXICODONE) 5 MG immediate release tablet Take 1-2 tablets (5-10 mg total) by mouth every 4 (four) hours as needed for moderate pain. 10 tablet 0  . pravastatin (PRAVACHOL) 40 MG tablet Take 40 mg by mouth daily.    Marland Kitchen rOPINIRole (REQUIP) 1 MG tablet Take 1 mg by mouth at bedtime.     . tamsulosin (FLOMAX) 0.4 MG CAPS capsule TAKE 1 CAPSULE (0.4 MG TOTAL) BY MOUTH ONCE DAILY. TAKE 30 MINUTES AFTER SAME MEAL EACH DAY.  3  . tiotropium (SPIRIVA) 18 MCG inhalation capsule Place 18 mcg into inhaler and inhale daily.    Marland Kitchen  VICTOZA 18 MG/3ML SOPN Inject 0.9 mg into the skin daily.     No current facility-administered medications on file prior to visit.  There are no Patient Instructions on file for this visit. No follow-ups on file.  Katilynn Sinkler A Jereme Loren, PA-C

## 2017-10-30 ENCOUNTER — Ambulatory Visit (INDEPENDENT_AMBULATORY_CARE_PROVIDER_SITE_OTHER): Payer: Medicare HMO | Admitting: Vascular Surgery

## 2017-10-30 ENCOUNTER — Encounter (INDEPENDENT_AMBULATORY_CARE_PROVIDER_SITE_OTHER): Payer: Self-pay | Admitting: Vascular Surgery

## 2017-10-30 VITALS — BP 132/75 | HR 89 | Resp 16 | Ht 67.0 in | Wt 253.6 lb

## 2017-10-30 DIAGNOSIS — I739 Peripheral vascular disease, unspecified: Secondary | ICD-10-CM

## 2017-10-30 DIAGNOSIS — I6523 Occlusion and stenosis of bilateral carotid arteries: Secondary | ICD-10-CM

## 2017-10-30 DIAGNOSIS — E785 Hyperlipidemia, unspecified: Secondary | ICD-10-CM

## 2017-10-30 DIAGNOSIS — E1159 Type 2 diabetes mellitus with other circulatory complications: Secondary | ICD-10-CM

## 2017-10-30 LAB — ECHOCARDIOGRAM COMPLETE
HEIGHTINCHES: 67 in
Weight: 4208.14 oz

## 2017-10-31 ENCOUNTER — Encounter (INDEPENDENT_AMBULATORY_CARE_PROVIDER_SITE_OTHER): Payer: Self-pay | Admitting: Vascular Surgery

## 2017-10-31 NOTE — Progress Notes (Signed)
Patient ID: [name], male   DOB: 1960/09/27, 57 y.o.   MRN: [id number]  Chief Complaint  Patient presents with  . Follow-up    HPI [name] is a 57 y.o. male.  Patient returns to the office he is status post successful left carotid endarterectomy.  He is concerned as he thought we would perform a right carotid endarterectomy in 6 weeks however follow-up duplex ultrasound as well as review of his CT scan shows his right carotid is more likely in the 60-70% range.  It is asymptomatic.  Left carotid endarterectomy was on on 09/08/2017.  Postoperative course was complicated with hypotension requiring pressor support.  He did go home on postoperative day #2.  He was seen by cardiology and his medications adjusted.  No neurologic deficits or other postoperative problems occurred.  The patient denies neck or incisional pain.  The patient denies interval amaurosis fugax. There is no recent history of TIA symptoms or focal motor deficits. There is no prior documented CVA.  The patient denies headache.  The patient is taking enteric-coated aspirin 81 mg daily.  The patient has a history of coronary artery disease, no recent episodes of angina or shortness of breath. The patient denies PAD or claudication symptoms. There is a history of hyperlipidemia which is being treated with a statin.     Past Medical History:  Diagnosis Date  . Cancer ([place])    skin (scalp)  . CHF (congestive heart failure) ([place])   . COPD (chronic obstructive pulmonary disease) ([place])   . Coronary artery disease   . Diabetes mellitus without complication ([place])   . MI (myocardial infarction) [facility])     Past Surgical History:  Procedure Laterality Date  . APPLICATION OF WOUND VAC     chest wound s/p CABG  . CARDIAC CATHETERIZATION    . CORONARY ARTERY BYPASS GRAFT  2015   3 vessels  . ENDARTERECTOMY Left 09/08/2017   Procedure: ENDARTERECTOMY CAROTID;  Surgeon: Katha Cabal, MD;  Location: [facility]  ORS;  Service: Vascular;  Laterality: Left;  . PERIPHERAL VASCULAR CATHETERIZATION N/A 06/09/2015   Procedure: Abdominal Aortogram w/Lower Extremity;  Surgeon: Katha Cabal, MD;  Location: [place] [facility] LAB;  Service: Cardiovascular;  Laterality: N/A;  . PERIPHERAL VASCULAR CATHETERIZATION  06/09/2015   Procedure: Lower Extremity Intervention;  Surgeon: Katha Cabal, MD;  Location: [place] [facility] LAB;  Service: Cardiovascular;;  . stent in leg Left   . WOUND DEBRIDEMENT     debridement of chest wound x 2 s/p CABG       No Known Allergies  Current Outpatient Medications  Medication Sig Dispense Refill  . acetaminophen (TYLENOL) 500 MG tablet Take 1,000-1,500 mg by mouth every 4 (four) hours as needed for moderate pain or headache.     . albuterol (PROVENTIL HFA;VENTOLIN HFA) 108 (90 Base) MCG/ACT inhaler Inhale 2 puffs into the lungs every 6 (six) hours as needed for wheezing or shortness of breath.    . allopurinol (ZYLOPRIM) 100 MG tablet Take 100 mg by mouth daily.  5  . aspirin EC 81 MG tablet Take 81 mg by mouth daily.    Marland Kitchen BREO ELLIPTA 100-25 MCG/INH AEPB INHALE 1 INHALATION INTO THE LUNGS ONCE DAILY  6  . carvedilol (COREG) 6.25 MG tablet Take 6.25 mg by mouth 2 (two) times daily with a meal.    . cyanocobalamin 1000 MCG tablet Take 1,000 mcg by mouth daily.    . DULoxetine (  CYMBALTA) 30 MG capsule TAKE 1 CAPSULE (30 MG TOTAL) BY MOUTH ONCE DAILY.  1  . furosemide (LASIX) 40 MG tablet Take 40 mg by mouth daily.    Marland Kitchen glipiZIDE (GLUCOTROL XL) 10 MG 24 hr tablet Take 10 mg by mouth 2 (two) times daily.     Marland Kitchen HYDROcodone-acetaminophen (NORCO) 7.5-325 MG tablet Take 1 tablet by mouth at bedtime.     . insulin NPH-regular Human (NOVOLIN 70/30) (70-30) 100 UNIT/ML injection Inject 35 Units into the skin 2 (two) times daily with a meal.     . lisinopril (PRINIVIL,ZESTRIL) 2.5 MG tablet Take 2.5 mg by mouth daily.     . magnesium gluconate (MAGONATE) 500 MG tablet Take 500 mg  by mouth daily.    . metFORMIN (GLUCOPHAGE) 1000 MG tablet Take 1,000 mg by mouth 2 (two) times daily with a meal.    . oxyCODONE (OXY IR/ROXICODONE) 5 MG immediate release tablet Take 1-2 tablets (5-10 mg total) by mouth every 4 (four) hours as needed for moderate pain. 10 tablet 0  . pravastatin (PRAVACHOL) 40 MG tablet Take 40 mg by mouth daily.    Marland Kitchen rOPINIRole (REQUIP) 1 MG tablet Take 1 mg by mouth at bedtime.     . tamsulosin (FLOMAX) 0.4 MG CAPS capsule TAKE 1 CAPSULE (0.4 MG TOTAL) BY MOUTH ONCE DAILY. TAKE 30 MINUTES AFTER SAME MEAL EACH DAY.  3  . tiotropium (SPIRIVA) 18 MCG inhalation capsule Place 18 mcg into inhaler and inhale daily.    Marland Kitchen VICTOZA 18 MG/3ML SOPN Inject 0.9 mg into the skin daily.     No current facility-administered medications for this visit.         Physical Exam BP 132/75 (BP Location: Left Arm)   Pulse 89   Resp 16   Ht 5\' 7"  (1.702 m)   Wt 115 kg (253 lb 9.6 oz)   BMI 39.72 kg/m  Gen:  WD/WN, NAD Skin: incision C/D/I     Assessment/Plan:  1. Bilateral carotid artery stenosis Recommend:  The patient is s/p successful left CEA  Duplex ultrasound preoperatively shows 60-70% contralateral stenosis.  Given this finding we will not proceed with staged carotid endarterectomy I will follow him in 6 months  Continue antiplatelet therapy as prescribed Continue management of CAD, HTN and Hyperlipidemia Healthy heart diet,  encouraged exercise at least 4 times per week  Follow up in 6 months with duplex ultrasound and physical exam based on the patient's carotid surgery and 60-70% stenosis of the right carotid artery    - VAS US CAROTID; Future  2. PAD (peripheral artery disease) (HCC)  Recommend:  The patient has evidence of atherosclerosis of the lower extremities with claudication.  The patient does not voice lifestyle limiting changes at this point in time.  Noninvasive studies do not suggest clinically significant change.  No invasive  studies, angiography or surgery at this time The patient should continue walking and begin a more formal exercise program.  The patient should continue antiplatelet therapy and aggressive treatment of the lipid abnormalities  No changes in the patient's medications at this time  The patient should continue wearing graduated compression socks 10-15 mmHg strength to control the mild edema.   - VAS [place] ABI WITH/WO TBI; Future - VAS [place] LOWER EXTREMITY ARTERIAL DUPLEX; Future  3. Type 2 diabetes mellitus with other circulatory complication, without long-term current use of insulin (HCC) Continue hypoglycemic medications as already ordered, these medications have been reviewed and there are no  changes at this time.  Hgb A1C to be monitored as already arranged by primary service   4. Hyperlipidemia, unspecified hyperlipidemia type Continue statin as ordered and reviewed, no changes at this time    Hortencia Pilar 10/31/2017, 9:19 AM   This note was created with Dragon medical transcription system.  Any errors from dictation are unintentional.

## 2017-11-08 DIAGNOSIS — Z951 Presence of aortocoronary bypass graft: Secondary | ICD-10-CM | POA: Diagnosis not present

## 2017-11-08 DIAGNOSIS — I429 Cardiomyopathy, unspecified: Secondary | ICD-10-CM | POA: Diagnosis not present

## 2017-11-08 DIAGNOSIS — E1142 Type 2 diabetes mellitus with diabetic polyneuropathy: Secondary | ICD-10-CM | POA: Diagnosis not present

## 2017-11-08 DIAGNOSIS — I739 Peripheral vascular disease, unspecified: Secondary | ICD-10-CM | POA: Diagnosis not present

## 2017-11-08 DIAGNOSIS — I6529 Occlusion and stenosis of unspecified carotid artery: Secondary | ICD-10-CM | POA: Diagnosis not present

## 2017-11-16 DIAGNOSIS — J439 Emphysema, unspecified: Secondary | ICD-10-CM | POA: Diagnosis not present

## 2017-11-16 DIAGNOSIS — I5022 Chronic systolic (congestive) heart failure: Secondary | ICD-10-CM | POA: Diagnosis not present

## 2017-11-16 DIAGNOSIS — E119 Type 2 diabetes mellitus without complications: Secondary | ICD-10-CM | POA: Diagnosis not present

## 2017-11-16 DIAGNOSIS — I2510 Atherosclerotic heart disease of native coronary artery without angina pectoris: Secondary | ICD-10-CM | POA: Diagnosis not present

## 2017-11-16 DIAGNOSIS — Z794 Long term (current) use of insulin: Secondary | ICD-10-CM | POA: Diagnosis not present

## 2017-11-16 DIAGNOSIS — E1142 Type 2 diabetes mellitus with diabetic polyneuropathy: Secondary | ICD-10-CM | POA: Diagnosis not present

## 2017-11-16 DIAGNOSIS — I429 Cardiomyopathy, unspecified: Secondary | ICD-10-CM | POA: Diagnosis not present

## 2017-11-23 ENCOUNTER — Other Ambulatory Visit: Payer: Self-pay | Admitting: Specialist

## 2017-11-23 DIAGNOSIS — R0609 Other forms of dyspnea: Secondary | ICD-10-CM

## 2017-11-23 DIAGNOSIS — J849 Interstitial pulmonary disease, unspecified: Secondary | ICD-10-CM

## 2017-11-23 DIAGNOSIS — R0602 Shortness of breath: Secondary | ICD-10-CM

## 2017-11-30 ENCOUNTER — Ambulatory Visit
Admission: RE | Admit: 2017-11-30 | Discharge: 2017-11-30 | Disposition: A | Discharge status: Home / Self Care | Payer: Medicare HMO | Source: Ambulatory Visit | Attending: Specialist | Admitting: Specialist

## 2017-11-30 DIAGNOSIS — J849 Interstitial pulmonary disease, unspecified: Secondary | ICD-10-CM | POA: Insufficient documentation

## 2017-11-30 DIAGNOSIS — I700 Atherosclerosis of aorta: Secondary | ICD-10-CM | POA: Insufficient documentation

## 2017-11-30 DIAGNOSIS — K8020 Calculus of gallbladder without cholecystitis without obstruction: Secondary | ICD-10-CM | POA: Diagnosis not present

## 2017-11-30 DIAGNOSIS — R590 Localized enlarged lymph nodes: Secondary | ICD-10-CM | POA: Insufficient documentation

## 2017-11-30 DIAGNOSIS — R0602 Shortness of breath: Secondary | ICD-10-CM | POA: Diagnosis not present

## 2017-11-30 DIAGNOSIS — R0609 Other forms of dyspnea: Secondary | ICD-10-CM | POA: Diagnosis not present

## 2017-12-08 DIAGNOSIS — I429 Cardiomyopathy, unspecified: Secondary | ICD-10-CM | POA: Diagnosis not present

## 2017-12-08 DIAGNOSIS — J439 Emphysema, unspecified: Secondary | ICD-10-CM | POA: Diagnosis not present

## 2017-12-08 DIAGNOSIS — R0609 Other forms of dyspnea: Secondary | ICD-10-CM | POA: Diagnosis not present

## 2017-12-08 DIAGNOSIS — J84112 Idiopathic pulmonary fibrosis: Secondary | ICD-10-CM | POA: Diagnosis not present

## 2018-01-29 ENCOUNTER — Encounter (INDEPENDENT_AMBULATORY_CARE_PROVIDER_SITE_OTHER): Payer: Medicare HMO

## 2018-01-29 ENCOUNTER — Ambulatory Visit (INDEPENDENT_AMBULATORY_CARE_PROVIDER_SITE_OTHER): Payer: Medicare HMO | Admitting: Vascular Surgery

## 2018-02-06 DIAGNOSIS — J849 Interstitial pulmonary disease, unspecified: Secondary | ICD-10-CM | POA: Diagnosis not present

## 2018-02-06 DIAGNOSIS — Z79899 Other long term (current) drug therapy: Secondary | ICD-10-CM | POA: Diagnosis not present

## 2018-02-06 DIAGNOSIS — J439 Emphysema, unspecified: Secondary | ICD-10-CM | POA: Diagnosis not present

## 2018-03-12 DIAGNOSIS — Z794 Long term (current) use of insulin: Secondary | ICD-10-CM | POA: Diagnosis not present

## 2018-03-12 DIAGNOSIS — I5022 Chronic systolic (congestive) heart failure: Secondary | ICD-10-CM | POA: Diagnosis not present

## 2018-03-12 DIAGNOSIS — E1142 Type 2 diabetes mellitus with diabetic polyneuropathy: Secondary | ICD-10-CM | POA: Diagnosis not present

## 2018-03-12 DIAGNOSIS — I2510 Atherosclerotic heart disease of native coronary artery without angina pectoris: Secondary | ICD-10-CM | POA: Diagnosis not present

## 2018-03-12 DIAGNOSIS — Z0000 Encounter for general adult medical examination without abnormal findings: Secondary | ICD-10-CM | POA: Diagnosis not present

## 2018-03-12 DIAGNOSIS — E119 Type 2 diabetes mellitus without complications: Secondary | ICD-10-CM | POA: Diagnosis not present

## 2018-03-12 DIAGNOSIS — I429 Cardiomyopathy, unspecified: Secondary | ICD-10-CM | POA: Diagnosis not present

## 2018-03-12 DIAGNOSIS — J439 Emphysema, unspecified: Secondary | ICD-10-CM | POA: Diagnosis not present

## 2018-03-19 DIAGNOSIS — E1142 Type 2 diabetes mellitus with diabetic polyneuropathy: Secondary | ICD-10-CM | POA: Diagnosis not present

## 2018-03-19 DIAGNOSIS — I429 Cardiomyopathy, unspecified: Secondary | ICD-10-CM | POA: Diagnosis not present

## 2018-03-19 DIAGNOSIS — I2510 Atherosclerotic heart disease of native coronary artery without angina pectoris: Secondary | ICD-10-CM | POA: Diagnosis not present

## 2018-03-19 DIAGNOSIS — G8929 Other chronic pain: Secondary | ICD-10-CM | POA: Insufficient documentation

## 2018-03-19 DIAGNOSIS — Z951 Presence of aortocoronary bypass graft: Secondary | ICD-10-CM | POA: Diagnosis not present

## 2018-03-19 DIAGNOSIS — J439 Emphysema, unspecified: Secondary | ICD-10-CM | POA: Diagnosis not present

## 2018-03-19 DIAGNOSIS — E785 Hyperlipidemia, unspecified: Secondary | ICD-10-CM | POA: Diagnosis not present

## 2018-03-19 DIAGNOSIS — M159 Polyosteoarthritis, unspecified: Secondary | ICD-10-CM | POA: Diagnosis not present

## 2018-03-19 DIAGNOSIS — M25511 Pain in right shoulder: Secondary | ICD-10-CM | POA: Insufficient documentation

## 2018-03-19 DIAGNOSIS — I739 Peripheral vascular disease, unspecified: Secondary | ICD-10-CM | POA: Diagnosis not present

## 2018-03-19 DIAGNOSIS — Z9181 History of falling: Secondary | ICD-10-CM | POA: Diagnosis not present

## 2018-04-12 DIAGNOSIS — I429 Cardiomyopathy, unspecified: Secondary | ICD-10-CM | POA: Diagnosis not present

## 2018-04-12 DIAGNOSIS — J84112 Idiopathic pulmonary fibrosis: Secondary | ICD-10-CM | POA: Diagnosis not present

## 2018-04-12 DIAGNOSIS — J439 Emphysema, unspecified: Secondary | ICD-10-CM | POA: Diagnosis not present

## 2018-04-12 DIAGNOSIS — R0609 Other forms of dyspnea: Secondary | ICD-10-CM | POA: Diagnosis not present

## 2018-04-30 DIAGNOSIS — I739 Peripheral vascular disease, unspecified: Secondary | ICD-10-CM | POA: Diagnosis not present

## 2018-04-30 DIAGNOSIS — E785 Hyperlipidemia, unspecified: Secondary | ICD-10-CM | POA: Diagnosis not present

## 2018-04-30 DIAGNOSIS — I5022 Chronic systolic (congestive) heart failure: Secondary | ICD-10-CM | POA: Diagnosis not present

## 2018-04-30 DIAGNOSIS — J439 Emphysema, unspecified: Secondary | ICD-10-CM | POA: Diagnosis not present

## 2018-04-30 DIAGNOSIS — Z951 Presence of aortocoronary bypass graft: Secondary | ICD-10-CM | POA: Diagnosis not present

## 2018-04-30 DIAGNOSIS — I2510 Atherosclerotic heart disease of native coronary artery without angina pectoris: Secondary | ICD-10-CM | POA: Diagnosis not present

## 2018-04-30 DIAGNOSIS — I6529 Occlusion and stenosis of unspecified carotid artery: Secondary | ICD-10-CM | POA: Diagnosis not present

## 2018-04-30 DIAGNOSIS — I9789 Other postprocedural complications and disorders of the circulatory system, not elsewhere classified: Secondary | ICD-10-CM | POA: Diagnosis not present

## 2018-04-30 DIAGNOSIS — E1142 Type 2 diabetes mellitus with diabetic polyneuropathy: Secondary | ICD-10-CM | POA: Diagnosis not present

## 2018-05-03 ENCOUNTER — Ambulatory Visit (INDEPENDENT_AMBULATORY_CARE_PROVIDER_SITE_OTHER): Payer: Medicare HMO | Admitting: Vascular Surgery

## 2018-05-03 ENCOUNTER — Ambulatory Visit (INDEPENDENT_AMBULATORY_CARE_PROVIDER_SITE_OTHER): Payer: Medicare HMO

## 2018-05-03 ENCOUNTER — Encounter (INDEPENDENT_AMBULATORY_CARE_PROVIDER_SITE_OTHER): Payer: Self-pay | Admitting: Vascular Surgery

## 2018-05-03 VITALS — BP 108/71 | HR 80 | Resp 16 | Ht 67.0 in | Wt 248.4 lb

## 2018-05-03 DIAGNOSIS — E785 Hyperlipidemia, unspecified: Secondary | ICD-10-CM | POA: Diagnosis not present

## 2018-05-03 DIAGNOSIS — E1159 Type 2 diabetes mellitus with other circulatory complications: Secondary | ICD-10-CM

## 2018-05-03 DIAGNOSIS — I739 Peripheral vascular disease, unspecified: Secondary | ICD-10-CM

## 2018-05-03 DIAGNOSIS — I6522 Occlusion and stenosis of left carotid artery: Secondary | ICD-10-CM | POA: Diagnosis not present

## 2018-05-03 DIAGNOSIS — I6523 Occlusion and stenosis of bilateral carotid arteries: Secondary | ICD-10-CM

## 2018-05-06 ENCOUNTER — Encounter (INDEPENDENT_AMBULATORY_CARE_PROVIDER_SITE_OTHER): Payer: Self-pay | Admitting: Vascular Surgery

## 2018-05-06 NOTE — Progress Notes (Signed)
MRN : [id number]  [name] is a 56 y.o. (08-02-1960) male who presents with chief complaint of  Chief Complaint  Patient presents with  . Follow-up    28month carotid and abi ultrasound  .  History of Present Illness:   Patient returns to the office he is status post successful left carotid endarterectomy.  He is concerned as he thought we would perform a right carotid endarterectomy in 6 weeks however follow-up duplex ultrasound as well as review of his CT scan shows his right carotid is more likely in the 60-70% range.  It is asymptomatic.  Left carotid endarterectomy was on on 09/08/2017.  Postoperative course was complicated with hypotension requiring pressor support.  He did go home on postoperative day #2.  He was seen by cardiology and his medications adjusted.  No neurologic deficits or other postoperative problems occurred.  The patient denies neck or incisional pain.  The patient denies interval amaurosis fugax. There is no recent history of TIA symptoms or focal motor deficits. There is no prior documented CVA.  The patient denies headache.  The patient is taking enteric-coated aspirin 81 mg daily.  The patient has a history of coronary artery disease, no recent episodes of angina or shortness of breath. The patient denies PAD or claudication symptoms. There is a history of hyperlipidemia which is being treated with a statin.   Today's Studies: ABI Rt=0.74 and Lt=0.84 (Previous ABI Rt=0.79 and Lt=0.65) Carotid duplex shows RICA 27-78% and LICA widely patent s/p left CEA 09/08/2017  Current Meds  Medication Sig  . acetaminophen (TYLENOL) 500 MG tablet Take 1,000-1,500 mg by mouth every 4 (four) hours as needed for moderate pain or headache.   . albuterol (PROVENTIL HFA;VENTOLIN HFA) 108 (90 Base) MCG/ACT inhaler Inhale 2 puffs into the lungs every 6 (six) hours as needed for wheezing or shortness of breath.  . allopurinol (ZYLOPRIM) 100 MG tablet Take 100 mg by  mouth daily.  Marland Kitchen aspirin EC 81 MG tablet Take 81 mg by mouth daily.  Marland Kitchen BREO ELLIPTA 100-25 MCG/INH AEPB INHALE 1 INHALATION INTO THE LUNGS ONCE DAILY  . carvedilol (COREG) 6.25 MG tablet Take 6.25 mg by mouth 2 (two) times daily with a meal.  . cyanocobalamin 1000 MCG tablet Take 1,000 mcg by mouth daily.  . DULoxetine (CYMBALTA) 30 MG capsule TAKE 1 CAPSULE (30 MG TOTAL) BY MOUTH ONCE DAILY.  . furosemide (LASIX) 40 MG tablet Take 40 mg by mouth daily.  Marland Kitchen glipiZIDE (GLUCOTROL XL) 10 MG 24 hr tablet Take 10 mg by mouth 2 (two) times daily.   Marland Kitchen HYDROcodone-acetaminophen (NORCO) 7.5-325 MG tablet Take 1 tablet by mouth at bedtime.   . insulin NPH-regular Human (NOVOLIN 70/30) (70-30) 100 UNIT/ML injection Inject 35 Units into the skin 2 (two) times daily with a meal.   . lisinopril (PRINIVIL,ZESTRIL) 2.5 MG tablet Take 2.5 mg by mouth daily.   . magnesium gluconate (MAGONATE) 500 MG tablet Take 500 mg by mouth daily.  . metFORMIN (GLUCOPHAGE) 1000 MG tablet Take 1,000 mg by mouth 2 (two) times daily with a meal.  . Pirfenidone (ESBRIET) 267 MG CAPS   . pravastatin (PRAVACHOL) 40 MG tablet Take 40 mg by mouth daily.  . tamsulosin (FLOMAX) 0.4 MG CAPS capsule TAKE 1 CAPSULE (0.4 MG TOTAL) BY MOUTH ONCE DAILY. TAKE 30 MINUTES AFTER SAME MEAL EACH DAY.  Marland Kitchen tiotropium (SPIRIVA) 18 MCG inhalation capsule Place 18 mcg into inhaler and inhale daily.  Marland Kitchen VICTOZA 18  MG/3ML SOPN Inject 0.9 mg into the skin daily.    Past Medical History:  Diagnosis Date  . Cancer ([place])    skin (scalp)  . CHF (congestive heart failure) ([place])   . COPD (chronic obstructive pulmonary disease) ([place])   . Coronary artery disease   . Diabetes mellitus without complication ([place])   . [facility]myocardial [facility])     Past Surgical History:  Procedure Laterality Date  . APPLICATION OF WOUND VAC     chest wound s/p CABG  . CARDIAC CATHETERIZATION    . CORONARY ARTERY BYPASS GRAFT  2015   3 vessels  . ENDARTERECTOMY Left  09/08/2017   Procedure: ENDARTERECTOMY CAROTID;  Surgeon: Katha Cabal, MD;  Location: [facility] ORS;  Service: Vascular;  Laterality: Left;  . PERIPHERAL VASCULAR CATHETERIZATION N/A 06/09/2015   Procedure: Abdominal Aortogram w/Lower Extremity;  Surgeon: Katha Cabal, MD;  Location: [place] CV LAB;  Service: Cardiovascular;  Laterality: N/A;  . PERIPHERAL VASCULAR CATHETERIZATION  06/09/2015   Procedure: Lower Extremity Intervention;  Surgeon: Katha Cabal, MD;  Location: [place] CV LAB;  Service: Cardiovascular;;  . stent in leg Left   . WOUND DEBRIDEMENT     debridement of chest wound x 2 s/p CABG     Social History Social History   Tobacco Use  . Smoking status: Former Research scientist (life sciences)  . Smokeless tobacco: Never Used  Substance Use Topics  . Alcohol use: Yes    Comment: seldom  . Drug use: No    Family History Family History  Problem Relation Age of Onset  . Heart attack Mother   . Cancer Father     No Known Allergies   REVIEW OF SYSTEMS (Negative unless checked)  Constitutional: [] Weight loss  [] Fever  [] Chills Cardiac: [] Chest pain   [] Chest pressure   [] Palpitations   [] Shortness of breath when laying flat   [] Shortness of breath with exertion. Vascular:  [x] Pain in legs with walking   [] Pain in legs at rest  [] History of DVT   [] Phlebitis   [] Swelling in legs   [] Varicose veins   [] Non-healing ulcers Pulmonary:   [] Uses home oxygen   [] Productive cough   [] Hemoptysis   [] Wheeze  [] COPD   [] Asthma Neurologic:  [] Dizziness   [] Seizures   [x] History of stroke   [x] History of TIA  [] Aphasia   [] Vissual changes   [] Weakness or numbness in arm   [] Weakness or numbness in leg Musculoskeletal:   [] Joint swelling   [] Joint pain   [] Low back pain Hematologic:  [] Easy bruising  [] Easy bleeding   [] Hypercoagulable state   [] Anemic Gastrointestinal:  [] Diarrhea   [] Vomiting  [] Gastroesophageal reflux/heartburn   [] Difficulty swallowing. Genitourinary:  [] Chronic  kidney disease   [] Difficult urination  [] Frequent urination   [] Blood in urine Skin:  [] Rashes   [] Ulcers  Psychological:  [] History of anxiety   []  History of major depression.  Physical Examination  Vitals:   05/03/18 1545  BP: 108/71  Pulse: 80  Resp: 16  Weight: 248 lb 6.4 oz (112.7 kg)  Height: 5\' 7"  (1.702 m)   Body mass index is 38.9 kg/m. Gen: WD/WN, NAD Head: [place]/AT, No temporalis wasting.  Ear/Nose/Throat: Hearing grossly intact, nares w/o erythema or drainage Eyes: PER, EOMI, sclera nonicteric.  Neck: Supple, no large masses.   Pulmonary:  Good air movement, no audible wheezing bilaterally, no use of accessory muscles.  Cardiac: RRR, no JVD Vascular:  Right and left carotid bruit.  Well healed  left cea incisional scar Vessel Right Left  Radial Palpable Palpable  Brachial Palpable Palpable  Carotid Palpable Palpable  PT Not Palpable Not Palpable  DP Not Palpable Not Palpable  Gastrointestinal: Non-distended. No guarding/no peritoneal signs.  Musculoskeletal: M/S 5/5 throughout.  No deformity or atrophy.  Neurologic: CN 2-12 intact. Symmetrical.  Speech is fluent. Motor exam as listed above. Psychiatric: Judgment intact, Mood & affect appropriate for pt's clinical situation. Dermatologic: No rashes or ulcers noted.  No changes consistent with cellulitis. Lymph : No lichenification or skin changes of chronic lymphedema.  CBC Lab Results  Component Value Date   WBC 10.3 09/09/2017   HGB 11.1 (L) 09/09/2017   HCT 34.8 (L) 09/09/2017   MCV 92.6 09/09/2017   PLT 150 09/09/2017    BMET    Component Value Date/Time   NA 134 (L) 09/10/2017 0528   NA 134 (L) 10/22/2013 0507   K 5.5 (H) 09/10/2017 0528   K 4.5 10/22/2013 0507   CL 107 09/10/2017 0528   CL 101 10/22/2013 0507   CO2 20 (L) 09/10/2017 0528   CO2 28 10/22/2013 0507   GLUCOSE 186 (H) 09/10/2017 0528   GLUCOSE 300 (H) 10/22/2013 0507   BUN 42 (H) 09/10/2017 0528   BUN 32 (H) 10/22/2013 0507    CREATININE 1.66 (H) 09/10/2017 0528   CREATININE 1.18 10/22/2013 0507   CALCIUM 7.6 (L) 09/10/2017 0528   CALCIUM 8.0 (L) 10/22/2013 0507   GFRNONAA 44 (L) 09/10/2017 0528   GFRNONAA >60 10/22/2013 0507   GFRAA 51 (L) 09/10/2017 0528   GFRAA >60 10/22/2013 0507   CrCl cannot be calculated (Patient's most recent lab result is older than the maximum 21 days allowed.).  COAG Lab Results  Component Value Date   INR 1.07 09/09/2017   INR 1.04 09/08/2017   INR 0.87 07/31/2017    Radiology No results found.   Assessment/Plan 1. PAD (peripheral artery disease) (HCC) Recommend:  The patient has evidence of atherosclerosis of the lower extremities with claudication.  The patient does not voice lifestyle limiting changes at this point in time.  Noninvasive studies do not suggest clinically significant change.  No invasive studies, angiography or surgery at this time The patient should continue walking and begin a more formal exercise program.  The patient should continue antiplatelet therapy and aggressive treatment of the lipid abnormalities  No changes in the patient's medications at this time  The patient should continue wearing graduated compression socks 10-15 mmHg strength to control the mild edema.  - VAS [place] ABI WITH/WO TBI; Future  2. Carotid stenosis, symptomatic w/o infarct, left Recommend:  The patient is s/p successful left CEA  Duplex ultrasound preoperatively shows 40-59% contralateral stenosis.  Given this finding we will not proceed with staged carotid endarterectomy I will continue to follow him in 6 months  Continue antiplatelet therapy as prescribed Continue management of CAD, HTN and Hyperlipidemia Healthy heart diet,  encouraged exercise at least 4 times per week  Follow up in 6 months with duplex ultrasound and physical exam based on the patient's carotid surgery and 40-59% stenosis of the right carotid artery   - VAS US CAROTID; Future  3.  Type 2 diabetes mellitus with other circulatory complication, without long-term current use of insulin (HCC) Continue hypoglycemic medications as already ordered, these medications have been reviewed and there are no changes at this time.  Hgb A1C to be monitored as already arranged by primary service   4. Hyperlipidemia, unspecified hyperlipidemia  type Continue statin as ordered and reviewed, no changes at this time     Hortencia Pilar, MD  05/06/2018 1:16 PM

## 2018-07-02 DIAGNOSIS — J84112 Idiopathic pulmonary fibrosis: Secondary | ICD-10-CM | POA: Diagnosis not present

## 2018-07-02 DIAGNOSIS — R0609 Other forms of dyspnea: Secondary | ICD-10-CM | POA: Diagnosis not present

## 2018-07-02 DIAGNOSIS — J432 Centrilobular emphysema: Secondary | ICD-10-CM | POA: Diagnosis not present

## 2018-07-02 DIAGNOSIS — Z79899 Other long term (current) drug therapy: Secondary | ICD-10-CM | POA: Diagnosis not present

## 2018-07-16 DIAGNOSIS — I429 Cardiomyopathy, unspecified: Secondary | ICD-10-CM | POA: Diagnosis not present

## 2018-07-16 DIAGNOSIS — Z951 Presence of aortocoronary bypass graft: Secondary | ICD-10-CM | POA: Diagnosis not present

## 2018-07-16 DIAGNOSIS — I739 Peripheral vascular disease, unspecified: Secondary | ICD-10-CM | POA: Diagnosis not present

## 2018-07-16 DIAGNOSIS — E1142 Type 2 diabetes mellitus with diabetic polyneuropathy: Secondary | ICD-10-CM | POA: Diagnosis not present

## 2018-07-16 DIAGNOSIS — J439 Emphysema, unspecified: Secondary | ICD-10-CM | POA: Diagnosis not present

## 2018-07-16 DIAGNOSIS — M159 Polyosteoarthritis, unspecified: Secondary | ICD-10-CM | POA: Diagnosis not present

## 2018-07-16 DIAGNOSIS — Z9181 History of falling: Secondary | ICD-10-CM | POA: Diagnosis not present

## 2018-07-16 DIAGNOSIS — E785 Hyperlipidemia, unspecified: Secondary | ICD-10-CM | POA: Diagnosis not present

## 2018-07-16 DIAGNOSIS — I2510 Atherosclerotic heart disease of native coronary artery without angina pectoris: Secondary | ICD-10-CM | POA: Diagnosis not present

## 2018-07-20 ENCOUNTER — Encounter: Payer: Self-pay | Admitting: Emergency Medicine

## 2018-07-20 ENCOUNTER — Emergency Department: Payer: Medicare HMO

## 2018-07-20 ENCOUNTER — Other Ambulatory Visit: Payer: Self-pay

## 2018-07-20 ENCOUNTER — Inpatient Hospital Stay
Admission: EM | Admit: 2018-07-20 | Discharge: 2018-07-24 | DRG: 193 | Disposition: A | Discharge status: Home / Self Care | Payer: Medicare HMO | Source: Ambulatory Visit | Attending: Internal Medicine | Admitting: Internal Medicine

## 2018-07-20 DIAGNOSIS — Z9889 Other specified postprocedural states: Secondary | ICD-10-CM

## 2018-07-20 DIAGNOSIS — I5022 Chronic systolic (congestive) heart failure: Secondary | ICD-10-CM | POA: Diagnosis not present

## 2018-07-20 DIAGNOSIS — I429 Cardiomyopathy, unspecified: Secondary | ICD-10-CM | POA: Diagnosis not present

## 2018-07-20 DIAGNOSIS — J449 Chronic obstructive pulmonary disease, unspecified: Secondary | ICD-10-CM | POA: Diagnosis not present

## 2018-07-20 DIAGNOSIS — Z951 Presence of aortocoronary bypass graft: Secondary | ICD-10-CM

## 2018-07-20 DIAGNOSIS — I5023 Acute on chronic systolic (congestive) heart failure: Secondary | ICD-10-CM | POA: Diagnosis present

## 2018-07-20 DIAGNOSIS — X58XXXA Exposure to other specified factors, initial encounter: Secondary | ICD-10-CM | POA: Diagnosis not present

## 2018-07-20 DIAGNOSIS — J84112 Idiopathic pulmonary fibrosis: Secondary | ICD-10-CM | POA: Diagnosis not present

## 2018-07-20 DIAGNOSIS — Z8249 Family history of ischemic heart disease and other diseases of the circulatory system: Secondary | ICD-10-CM

## 2018-07-20 DIAGNOSIS — Z7982 Long term (current) use of aspirin: Secondary | ICD-10-CM | POA: Diagnosis not present

## 2018-07-20 DIAGNOSIS — E1142 Type 2 diabetes mellitus with diabetic polyneuropathy: Secondary | ICD-10-CM | POA: Diagnosis not present

## 2018-07-20 DIAGNOSIS — Z79899 Other long term (current) drug therapy: Secondary | ICD-10-CM | POA: Diagnosis not present

## 2018-07-20 DIAGNOSIS — I252 Old myocardial infarction: Secondary | ICD-10-CM | POA: Diagnosis not present

## 2018-07-20 DIAGNOSIS — R809 Proteinuria, unspecified: Secondary | ICD-10-CM | POA: Diagnosis not present

## 2018-07-20 DIAGNOSIS — Z6841 Body Mass Index (BMI) 40.0 and over, adult: Secondary | ICD-10-CM

## 2018-07-20 DIAGNOSIS — R05 Cough: Secondary | ICD-10-CM | POA: Diagnosis not present

## 2018-07-20 DIAGNOSIS — Z794 Long term (current) use of insulin: Secondary | ICD-10-CM | POA: Diagnosis not present

## 2018-07-20 DIAGNOSIS — J189 Pneumonia, unspecified organism: Principal | ICD-10-CM | POA: Diagnosis present

## 2018-07-20 DIAGNOSIS — E1151 Type 2 diabetes mellitus with diabetic peripheral angiopathy without gangrene: Secondary | ICD-10-CM | POA: Diagnosis present

## 2018-07-20 DIAGNOSIS — I2510 Atherosclerotic heart disease of native coronary artery without angina pectoris: Secondary | ICD-10-CM | POA: Diagnosis present

## 2018-07-20 DIAGNOSIS — Z79891 Long term (current) use of opiate analgesic: Secondary | ICD-10-CM

## 2018-07-20 DIAGNOSIS — E1165 Type 2 diabetes mellitus with hyperglycemia: Secondary | ICD-10-CM | POA: Diagnosis not present

## 2018-07-20 DIAGNOSIS — E119 Type 2 diabetes mellitus without complications: Secondary | ICD-10-CM | POA: Diagnosis not present

## 2018-07-20 DIAGNOSIS — N400 Enlarged prostate without lower urinary tract symptoms: Secondary | ICD-10-CM | POA: Diagnosis present

## 2018-07-20 DIAGNOSIS — J168 Pneumonia due to other specified infectious organisms: Secondary | ICD-10-CM | POA: Diagnosis not present

## 2018-07-20 DIAGNOSIS — Z87891 Personal history of nicotine dependence: Secondary | ICD-10-CM

## 2018-07-20 DIAGNOSIS — Z85828 Personal history of other malignant neoplasm of skin: Secondary | ICD-10-CM | POA: Diagnosis not present

## 2018-07-20 DIAGNOSIS — R0602 Shortness of breath: Secondary | ICD-10-CM | POA: Diagnosis not present

## 2018-07-20 DIAGNOSIS — T380X5A Adverse effect of glucocorticoids and synthetic analogues, initial encounter: Secondary | ICD-10-CM | POA: Diagnosis not present

## 2018-07-20 DIAGNOSIS — M2550 Pain in unspecified joint: Secondary | ICD-10-CM | POA: Diagnosis not present

## 2018-07-20 DIAGNOSIS — Z66 Do not resuscitate: Secondary | ICD-10-CM | POA: Diagnosis present

## 2018-07-20 DIAGNOSIS — J440 Chronic obstructive pulmonary disease with acute lower respiratory infection: Secondary | ICD-10-CM | POA: Diagnosis present

## 2018-07-20 DIAGNOSIS — R0902 Hypoxemia: Secondary | ICD-10-CM | POA: Diagnosis not present

## 2018-07-20 DIAGNOSIS — J9601 Acute respiratory failure with hypoxia: Secondary | ICD-10-CM | POA: Diagnosis present

## 2018-07-20 DIAGNOSIS — J8410 Pulmonary fibrosis, unspecified: Secondary | ICD-10-CM | POA: Diagnosis not present

## 2018-07-20 DIAGNOSIS — J181 Lobar pneumonia, unspecified organism: Secondary | ICD-10-CM

## 2018-07-20 DIAGNOSIS — R000 Tachycardia, unspecified: Secondary | ICD-10-CM | POA: Diagnosis not present

## 2018-07-20 LAB — CBC
HCT: 39.4 % (ref 39.0–52.0)
HEMOGLOBIN: 12.2 g/dL — AB (ref 13.0–17.0)
MCH: 29.9 pg (ref 26.0–34.0)
MCHC: 31 g/dL (ref 30.0–36.0)
MCV: 96.6 fL (ref 80.0–100.0)
NRBC: 0 % (ref 0.0–0.2)
Platelets: 149 10*3/uL — ABNORMAL LOW (ref 150–400)
RBC: 4.08 MIL/uL — AB (ref 4.22–5.81)
RDW: 14 % (ref 11.5–15.5)
WBC: 8 10*3/uL (ref 4.0–10.5)

## 2018-07-20 LAB — GLUCOSE, CAPILLARY: Glucose-Capillary: 150 mg/dL — ABNORMAL HIGH (ref 70–99)

## 2018-07-20 LAB — BASIC METABOLIC PANEL
ANION GAP: 11 (ref 5–15)
BUN: 24 mg/dL — ABNORMAL HIGH (ref 6–20)
CO2: 25 mmol/L (ref 22–32)
Calcium: 8.3 mg/dL — ABNORMAL LOW (ref 8.9–10.3)
Chloride: 104 mmol/L (ref 98–111)
Creatinine, Ser: 1.09 mg/dL (ref 0.61–1.24)
GFR calc Af Amer: >60 mL/min (ref >60)
GLUCOSE: 128 mg/dL — AB (ref 70–99)
POTASSIUM: 3.4 mmol/L — AB (ref 3.5–5.1)
Sodium: 140 mmol/L (ref 135–145)

## 2018-07-20 LAB — INFLUENZA PANEL BY PCR (TYPE A & B)
Influenza A By PCR: NEGATIVE
Influenza B By PCR: NEGATIVE

## 2018-07-20 LAB — BRAIN NATRIURETIC PEPTIDE: B Natriuretic Peptide: 158 pg/mL — ABNORMAL HIGH (ref 0.0–100.0)

## 2018-07-20 LAB — PROCALCITONIN: Procalcitonin: <0.1 ng/mL

## 2018-07-20 LAB — TROPONIN I: Troponin I: 0.03 ng/mL (ref <0.03)

## 2018-07-20 MED ORDER — ENOXAPARIN SODIUM 40 MG/0.4ML ~~LOC~~ SOLN
40.0000 mg | SUBCUTANEOUS | Status: DC
  Administered 2018-07-20 – 2018-07-22 (×3): 40 mg via SUBCUTANEOUS
  Filled 2018-07-20 (×3): qty 0.4

## 2018-07-20 MED ORDER — IPRATROPIUM-ALBUTEROL 0.5-2.5 (3) MG/3ML IN SOLN
3.0000 mL | Freq: Once | RESPIRATORY_TRACT | Status: AC
  Administered 2018-07-20: 3 mL via RESPIRATORY_TRACT
  Filled 2018-07-20: qty 3

## 2018-07-20 MED ORDER — PRAVASTATIN SODIUM 40 MG PO TABS
40.0000 mg | ORAL_TABLET | Freq: Every day | ORAL | Status: DC
  Administered 2018-07-20 – 2018-07-24 (×5): 40 mg via ORAL
  Filled 2018-07-20 (×5): qty 1

## 2018-07-20 MED ORDER — ALLOPURINOL 100 MG PO TABS: 100.0000 mg | ORAL_TABLET | Freq: Every day | ORAL | Status: DC

## 2018-07-20 MED ORDER — LISINOPRIL 5 MG PO TABS
2.5000 mg | ORAL_TABLET | Freq: Every day | ORAL | Status: DC
  Administered 2018-07-21 – 2018-07-22 (×2): 2.5 mg via ORAL
  Filled 2018-07-20 (×2): qty 1

## 2018-07-20 MED ORDER — ACETAMINOPHEN 650 MG RE SUPP: 650.0000 mg | Freq: Four times a day (QID) | RECTAL | Status: DC | PRN

## 2018-07-20 MED ORDER — HYDROCODONE-ACETAMINOPHEN 5-325 MG PO TABS
1.0000 | ORAL_TABLET | ORAL | Status: DC | PRN
  Administered 2018-07-20: 1 via ORAL
  Administered 2018-07-21 – 2018-07-22 (×8): 2 via ORAL
  Administered 2018-07-23: 1 via ORAL
  Administered 2018-07-23: 2 via ORAL
  Administered 2018-07-23: 1 via ORAL
  Administered 2018-07-24: 2 via ORAL
  Filled 2018-07-20: qty 1
  Filled 2018-07-20: qty 2
  Filled 2018-07-20: qty 1
  Filled 2018-07-20 (×2): qty 2
  Filled 2018-07-20: qty 1
  Filled 2018-07-20 (×2): qty 2
  Filled 2018-07-20: qty 1
  Filled 2018-07-20 (×4): qty 2
  Filled 2018-07-20: qty 1

## 2018-07-20 MED ORDER — TAMSULOSIN HCL 0.4 MG PO CAPS
0.4000 mg | ORAL_CAPSULE | Freq: Every day | ORAL | Status: DC
  Administered 2018-07-20 – 2018-07-24 (×5): 0.4 mg via ORAL
  Filled 2018-07-20 (×5): qty 1

## 2018-07-20 MED ORDER — DOCUSATE SODIUM 100 MG PO CAPS
100.0000 mg | ORAL_CAPSULE | Freq: Two times a day (BID) | ORAL | Status: DC
  Administered 2018-07-21 – 2018-07-24 (×6): 100 mg via ORAL
  Filled 2018-07-20 (×6): qty 1

## 2018-07-20 MED ORDER — CARVEDILOL 6.25 MG PO TABS
6.2500 mg | ORAL_TABLET | Freq: Two times a day (BID) | ORAL | Status: DC
  Administered 2018-07-21 – 2018-07-22 (×3): 6.25 mg via ORAL
  Filled 2018-07-20 (×3): qty 1

## 2018-07-20 MED ORDER — ACETAMINOPHEN 325 MG PO TABS: 650.0000 mg | ORAL_TABLET | Freq: Four times a day (QID) | ORAL | Status: DC | PRN

## 2018-07-20 MED ORDER — METHYLPREDNISOLONE SODIUM SUCC 125 MG IJ SOLR
125.0000 mg | Freq: Once | INTRAMUSCULAR | Status: AC
  Administered 2018-07-20: 125 mg via INTRAVENOUS
  Filled 2018-07-20: qty 2

## 2018-07-20 MED ORDER — TAMSULOSIN HCL 0.4 MG PO CAPS: 0.4000 mg | ORAL_CAPSULE | Freq: Every day | ORAL | Status: DC

## 2018-07-20 MED ORDER — ALLOPURINOL 100 MG PO TABS
100.0000 mg | ORAL_TABLET | Freq: Every day | ORAL | Status: DC
  Administered 2018-07-20 – 2018-07-24 (×5): 100 mg via ORAL
  Filled 2018-07-20 (×5): qty 1

## 2018-07-20 MED ORDER — ONDANSETRON HCL 4 MG/2ML IJ SOLN: 4.0000 mg | Freq: Four times a day (QID) | INTRAMUSCULAR | Status: DC | PRN

## 2018-07-20 MED ORDER — DULOXETINE HCL 30 MG PO CPEP
30.0000 mg | ORAL_CAPSULE | Freq: Every evening | ORAL | Status: DC
  Administered 2018-07-20 – 2018-07-23 (×4): 30 mg via ORAL
  Filled 2018-07-20 (×4): qty 1

## 2018-07-20 MED ORDER — SODIUM CHLORIDE 0.9 % IV SOLN
2.0000 g | INTRAVENOUS | Status: DC
  Administered 2018-07-20 – 2018-07-22 (×3): 2 g via INTRAVENOUS
  Filled 2018-07-20 (×3): qty 20
  Filled 2018-07-20 (×2): qty 2
  Filled 2018-07-20: qty 20

## 2018-07-20 MED ORDER — BISACODYL 5 MG PO TBEC: 5.0000 mg | DELAYED_RELEASE_TABLET | Freq: Every day | ORAL | Status: DC | PRN

## 2018-07-20 MED ORDER — ONDANSETRON HCL 4 MG PO TABS: 4.0000 mg | ORAL_TABLET | Freq: Four times a day (QID) | ORAL | Status: DC | PRN

## 2018-07-20 MED ORDER — ROPINIROLE HCL 1 MG PO TABS
1.0000 mg | ORAL_TABLET | Freq: Every day | ORAL | Status: DC
  Administered 2018-07-20 – 2018-07-23 (×4): 1 mg via ORAL
  Filled 2018-07-20 (×4): qty 1

## 2018-07-20 MED ORDER — FUROSEMIDE 10 MG/ML IJ SOLN
40.0000 mg | Freq: Two times a day (BID) | INTRAMUSCULAR | Status: DC
  Administered 2018-07-21: 40 mg via INTRAVENOUS
  Filled 2018-07-20: qty 4

## 2018-07-20 MED ORDER — IPRATROPIUM-ALBUTEROL 0.5-2.5 (3) MG/3ML IN SOLN
3.0000 mL | Freq: Four times a day (QID) | RESPIRATORY_TRACT | Status: DC
  Administered 2018-07-20 – 2018-07-21 (×3): 3 mL via RESPIRATORY_TRACT
  Filled 2018-07-20 (×3): qty 3

## 2018-07-20 MED ORDER — SODIUM CHLORIDE 0.9 % IV SOLN
500.0000 mg | INTRAVENOUS | Status: DC
  Administered 2018-07-20: 500 mg via INTRAVENOUS
  Filled 2018-07-20 (×2): qty 500

## 2018-07-20 NOTE — ED Provider Notes (Signed)
[facility] Emergency Department Provider Note    First MD Initiated Contact with Patient 07/20/18 1753     (approximate)  I have reviewed the triage vital signs and the nursing notes.   HISTORY  Chief Complaint Shortness of Breath    HPI [name] is a 57 y.o. male with a history of COPD can just of heart failure as well as interstitial pulmonary fibrosis presents the ER with worsening shortness of breath and exertional dyspnea as well as orthopnea.  States he was seen in his primary care office today and had oxygen dropped to 80% with very short ambulation.  Still feels short of breath.  He is been compliant with his medications.  Denies any recent antibiotics.  Does endorse worsening swelling in his legs.  Denies any change in diet.    Past Medical History:  Diagnosis Date  . Cancer ([place])    skin (scalp)  . CHF (congestive heart failure) ([place])   . COPD (chronic obstructive pulmonary disease) ([place])   . Coronary artery disease   . Diabetes mellitus without complication ([place])   . MI (myocardial infarction) ([place])    Family History  Problem Relation Age of Onset  . Heart attack Mother   . Cancer Father    Past Surgical History:  Procedure Laterality Date  . APPLICATION OF WOUND VAC     chest wound s/p CABG  . CARDIAC CATHETERIZATION    . CORONARY ARTERY BYPASS GRAFT  2015   3 vessels  . ENDARTERECTOMY Left 09/08/2017   Procedure: ENDARTERECTOMY CAROTID;  Surgeon: Katha Cabal, MD;  Location: [facility] ORS;  Service: Vascular;  Laterality: Left;  . PERIPHERAL VASCULAR CATHETERIZATION N/A 06/09/2015   Procedure: Abdominal Aortogram w/Lower Extremity;  Surgeon: Katha Cabal, MD;  Location: [place] CV LAB;  Service: Cardiovascular;  Laterality: N/A;  . PERIPHERAL VASCULAR CATHETERIZATION  06/09/2015   Procedure: Lower Extremity Intervention;  Surgeon: Katha Cabal, MD;  Location: [place] CV LAB;  Service: Cardiovascular;;    . stent in leg Left   . WOUND DEBRIDEMENT     debridement of chest wound x 2 s/p CABG    Patient Active Problem List   Diagnosis Date Noted  . Carotid stenosis, symptomatic w/o infarct, left 09/08/2017  . Carotid stenosis 07/10/2017  . PAD (peripheral artery disease) ([place]) 08/05/2016  . Diabetes mellitus ([place]) 08/05/2016  . Hyperlipidemia 08/05/2016      Prior to Admission medications   Medication Sig Start Date End Date Taking? Authorizing Provider  acetaminophen (TYLENOL) 500 MG tablet Take 1,000-1,500 mg by mouth every 4 (four) hours as needed for moderate pain or headache.     [provider]  albuterol (PROVENTIL HFA;VENTOLIN HFA) 108 (90 Base) MCG/ACT inhaler Inhale 2 puffs into the lungs every 6 (six) hours as needed for wheezing or shortness of breath.    [provider]  allopurinol (ZYLOPRIM) 100 MG tablet Take 100 mg by mouth daily. 07/04/17   [provider]  aspirin EC 81 MG tablet Take 81 mg by mouth daily.    [provider]  BREO ELLIPTA 100-25 MCG/INH AEPB INHALE 1 INHALATION INTO THE LUNGS ONCE DAILY 10/23/17   [provider]  carvedilol (COREG) 6.25 MG tablet Take 6.25 mg by mouth 2 (two) times daily with a meal.    [provider]  cyanocobalamin 1000 MCG tablet Take 1,000 mcg by mouth daily.    [provider]  DULoxetine (CYMBALTA) 30  MG capsule TAKE 1 CAPSULE (30 MG TOTAL) BY MOUTH ONCE DAILY. 06/23/17   [provider]  furosemide (LASIX) 40 MG tablet Take 40 mg by mouth daily.    [provider]  glipiZIDE (GLUCOTROL XL) 10 MG 24 hr tablet Take 10 mg by mouth 2 (two) times daily.     [provider]  HYDROcodone-acetaminophen (NORCO) 7.5-325 MG tablet Take 1 tablet by mouth at bedtime.     [provider]  insulin NPH-regular Human (NOVOLIN 70/30) (70-30) 100 UNIT/ML injection Inject 35 Units into the skin 2 (two) times daily with a meal.     [provider]  lisinopril (PRINIVIL,ZESTRIL) 2.5 MG tablet Take 2.5 mg by mouth daily.  06/20/17   [provider]  magnesium gluconate (MAGONATE) 500 MG tablet Take 500 mg by mouth daily.    [provider]  metFORMIN (GLUCOPHAGE) 1000 MG tablet Take 1,000 mg by mouth 2 (two) times daily with a meal.    [provider]  oxyCODONE (OXY IR/ROXICODONE) 5 MG immediate release tablet Take 1-2 tablets (5-10 mg total) by mouth every 4 (four) hours as needed for moderate pain. Patient not taking: Reported on 05/03/2018 09/10/17   Serafina Mitchell, MD  Pirfenidone (ESBRIET) 267 MG CAPS  03/28/18   [provider]  pravastatin (PRAVACHOL) 40 MG tablet Take 40 mg by mouth daily.    [provider]  rOPINIRole (REQUIP) 1 MG tablet Take 1 mg by mouth at bedtime.  04/04/17 04/04/18  [provider]  tamsulosin (FLOMAX) 0.4 MG CAPS capsule TAKE 1 CAPSULE (0.4 MG TOTAL) BY MOUTH ONCE DAILY. TAKE 30 MINUTES AFTER SAME MEAL EACH DAY. 07/04/17   [provider]  tiotropium (SPIRIVA) 18 MCG inhalation capsule Place 18 mcg into inhaler and inhale daily.    [provider]  VICTOZA 18 MG/3ML SOPN Inject 0.9 mg into the skin daily. 08/12/17   [provider]    Allergies Patient has no known allergies.    Social History Social History   Tobacco Use  . Smoking status: Former Research scientist (life sciences)  . Smokeless tobacco: Never Used  Substance Use Topics  . Alcohol use: Yes    Comment: seldom  . Drug use: No    Review of Systems Patient denies headaches, rhinorrhea, blurry vision, numbness, shortness of breath, chest pain, edema, cough, abdominal pain, nausea, vomiting, diarrhea, dysuria, fevers, rashes or hallucinations unless otherwise stated above in HPI. ____________________________________________   PHYSICAL EXAM:  VITAL SIGNS: Vitals:   07/20/18 1412  BP: 113/75  Pulse: 100  Resp: 18  Temp: (!) 97.5 F (36.4 C)  SpO2: 95%    Constitutional:  Alert and oriented.  Eyes: Conjunctivae are normal.  Head: Atraumatic. Nose: No congestion/rhinnorhea. Mouth/Throat: Mucous membranes are moist.   Neck: No stridor. Painless ROM.  Cardiovascular: Normal rate, regular rhythm. Grossly normal heart sounds.  Good peripheral circulation. Respiratory: mild tachypnea, diminished breathsound in posterior bases,  Faint wheeze and inspiratory crackle. Gastrointestinal: Soft and nontender. No distention. No abdominal bruits. No CVA tenderness. Genitourinary:  Musculoskeletal: No lower extremity tenderness, 2+ BLE edema.  No joint effusions. Neurologic:  Normal speech and language. No gross focal neurologic deficits are appreciated. No facial droop Skin:  Skin is warm, dry and intact. No rash noted. Psychiatric: Mood and affect are normal. Speech and behavior are normal.  ____________________________________________   LABS (all labs ordered are listed, but only abnormal results are displayed)  Results for orders placed or performed during  the hospital encounter of 07/20/18 (from the past 24 hour(s))  Basic metabolic panel     Status: Abnormal   Collection Time: 07/20/18  2:16 PM  Result Value Ref Range   Sodium 140 135 - 145 mmol/L   Potassium 3.4 (L) 3.5 - 5.1 mmol/L   Chloride 104 98 - 111 mmol/L   CO2 25 22 - 32 mmol/L   Glucose, Bld 128 (H) 70 - 99 mg/dL   BUN 24 (H) 6 - 20 mg/dL   Creatinine, Ser 1.09 0.61 - 1.24 mg/dL   Calcium 8.3 (L) 8.9 - 10.3 mg/dL   GFR calc non Af Amer >60 >60 mL/min   GFR calc Af Amer >60 >60 mL/min   Anion gap 11 5 - 15  CBC     Status: Abnormal   Collection Time: 07/20/18  2:16 PM  Result Value Ref Range   WBC 8.0 4.0 - 10.5 K/uL   RBC 4.08 (L) 4.22 - 5.81 MIL/uL   Hemoglobin 12.2 (L) 13.0 - 17.0 g/dL   HCT 39.4 39.0 - 52.0 %   MCV 96.6 80.0 - 100.0 fL   MCH 29.9 26.0 - 34.0 pg   MCHC 31.0 30.0 - 36.0 g/dL   RDW 14.0 11.5 - 15.5 %   Platelets 149 (L) 150 - 400 K/uL   nRBC 0.0 0.0 - 0.2 %  Troponin I  - ONCE - STAT     Status: Abnormal   Collection Time: 07/20/18  2:16 PM  Result Value Ref Range   Troponin I 0.03 (HH) <0.03 ng/mL   ____________________________________________  EKG My review and personal interpretation at Time: 18:07   Indication: SOB  Rate: 100  Rhythm: sinus Axis: normal Other: normal intervals, no stemi, poor r wave progression ____________________________________________  RADIOLOGY  I personally reviewed all radiographic images ordered to evaluate for the above acute complaints and reviewed radiology reports and findings.  These findings were personally discussed with the patient.  Please see medical record for radiology report.  ____________________________________________   PROCEDURES  Procedure(s) performed:  Procedures    Critical Care performed: no ____________________________________________   INITIAL IMPRESSION / ASSESSMENT AND PLAN / ED COURSE  Pertinent labs & imaging results that were available during my care of the patient were reviewed by me and considered in my medical decision making (see chart for details).   DDX: Asthma, copd, CHF, pna, ptx, malignancy, Pe, anemia   COTEY RAKES is a 57 y.o. who presents to the ED with symptoms as described above.  Patient with mixed picture given history of COPD, IPF as well as congestive heart failure.  Did have evidence of acute respiratory failure with hypoxia.  Seems to be bronchodilator responsive but chest x-ray also showing evidence of probable right lower lobe infiltrate.  Does not have any leukocytosis or fever but does have structural lung abnormalities with acute change in his symptoms.  Possible congestive heart failure but BNP is only borderline elevated.  Will treat for community-acquired pneumonia.  Will give steroids as well as bronchodilators.  Do feel patient will require hospitalization for further medical management.      As part of my medical decision making, I reviewed the  following data within the [facility] notes reviewed and incorporated, Labs reviewed, notes from prior ED visits.   ____________________________________________   FINAL CLINICAL IMPRESSION(S) / ED DIAGNOSES  Final diagnoses:  Acute respiratory failure with hypoxia ([place])  Pneumonia of right lower lobe due to infectious organism (  [place])      NEW MEDICATIONS STARTED DURING THIS VISIT:  New Prescriptions   No medications on file     Note:  This document was prepared using Dragon voice recognition software and may include unintentional dictation errors.    Merlyn Lot, MD 07/20/18 [phone]

## 2018-07-20 NOTE — H&P (Addendum)
[facility] [place] NAME: [name]    MR#:  [id number]  DATE OF BIRTH:  05/05/1961  DATE OF ADMISSION:  07/20/2018  PRIMARY CARE PHYSICIAN: Tracie Harrier, MD   REQUESTING/REFERRING PHYSICIAN: Dr. Merlyn Lot  CHIEF COMPLAINT: Shortness of breath, hypoxia   Chief Complaint  Patient presents with  . Shortness of Breath    HISTORY OF PRESENT ILLNESS:  [name]  is a 57 y.o. male with a known history of chronic systolic heart failure, COPD, idiopathic pulmonary fibrosis comes in because of shortness of breath.  Patient went to PCP for routine checkup and found to have hypoxia with room air saturation dropped to 80% and he is very short of breath with ambulation.  And has orthopnea, PND, pedal edema, recently increase the dose of Lasix but with out much improvement in shortness of breath, pedal edema.  Patient BNP slightly elevated chest x-ray is concerning for basal pneumonia.  Troponin 0 0.03.  No chest pain.  PAST MEDICAL HISTORY:   Past Medical History:  Diagnosis Date  . Cancer ([place])    skin (scalp)  . CHF (congestive heart failure) ([place])   . COPD (chronic obstructive pulmonary disease) ([place])   . Coronary artery disease   . Diabetes mellitus without complication ([place])   . MI (myocardial infarction) ([place])     PAST SURGICAL HISTOIRY:   Past Surgical History:  Procedure Laterality Date  . APPLICATION OF WOUND VAC     chest wound s/p CABG  . CARDIAC CATHETERIZATION    . CORONARY ARTERY BYPASS GRAFT  2015   3 vessels  . ENDARTERECTOMY Left 09/08/2017   Procedure: ENDARTERECTOMY CAROTID;  Surgeon: Katha Cabal, MD;  Location: [facility] ORS;  Service: Vascular;  Laterality: Left;  . PERIPHERAL VASCULAR CATHETERIZATION N/A 06/09/2015   Procedure: Abdominal Aortogram w/Lower Extremity;  Surgeon: Katha Cabal, MD;  Location: [place] [facility];  Service: Cardiovascular;  Laterality: N/A;  . PERIPHERAL  VASCULAR CATHETERIZATION  06/09/2015   Procedure: Lower Extremity Intervention;  Surgeon: Katha Cabal, MD;  Location: [place] [facility];  Service: Cardiovascular;;  . stent in leg Left   . WOUND DEBRIDEMENT     debridement of chest wound x 2 s/p CABG     SOCIAL HISTORY:   Social History   Tobacco Use  . Smoking status: Former Research scientist (life sciences)  . Smokeless tobacco: Never Used  Substance Use Topics  . Alcohol use: Yes    Comment: seldom    FAMILY HISTORY:   Family History  Problem Relation Age of Onset  . Heart attack Mother   . Cancer Father     DRUG ALLERGIES:  No Known Allergies  REVIEW OF SYSTEMS:  CONSTITUTIONAL: No fever, fatigue or weakness.  EYES: No blurred or double vision.  EARS, NOSE, AND THROAT: No tinnitus or ear pain.  RESPIRATORY: Cough, shortness of breath. CARDIOVASCULAR: No chest pain, orthopnea, edema.  GASTROINTESTINAL: No nausea, vomiting, diarrhea or abdominal pain.  GENITOURINARY: No dysuria, hematuria.  ENDOCRINE: No polyuria, nocturia,  HEMATOLOGY: No anemia, easy bruising or bleeding SKIN: No rash or lesion. MUSCULOSKELETAL has pedal edema. NEUROLOGIC: No tingling, numbness, weakness.  PSYCHIATRY: No anxiety or depression.   MEDICATIONS AT HOME:   Prior to Admission medications   Medication Sig Start Date End Date Taking? Authorizing Provider  acetaminophen (TYLENOL) 500 MG tablet Take 1,000-1,500 mg by mouth every 4 (four) hours as needed for moderate pain or headache.  [provider]  albuterol (PROVENTIL HFA;VENTOLIN HFA) 108 (90 Base) MCG/ACT inhaler Inhale 2 puffs into the lungs every 6 (six) hours as needed for wheezing or shortness of breath.    [provider]  allopurinol (ZYLOPRIM) 100 MG tablet Take 100 mg by mouth daily. 07/04/17   [provider]  aspirin EC 81 MG tablet Take 81 mg by mouth daily.    [provider]  BREO ELLIPTA 100-25 MCG/INH AEPB INHALE 1 INHALATION INTO THE LUNGS ONCE  DAILY 10/23/17   [provider]  carvedilol (COREG) 6.25 MG tablet Take 6.25 mg by mouth 2 (two) times daily with a meal.    [provider]  cyanocobalamin 1000 MCG tablet Take 1,000 mcg by mouth daily.    [provider]  DULoxetine (CYMBALTA) 30 MG capsule TAKE 1 CAPSULE (30 MG TOTAL) BY MOUTH ONCE DAILY. 06/23/17   [provider]  furosemide (LASIX) 40 MG tablet Take 40 mg by mouth daily.    [provider]  glipiZIDE (GLUCOTROL XL) 10 MG 24 hr tablet Take 10 mg by mouth 2 (two) times daily.     [provider]  HYDROcodone-acetaminophen (NORCO) 7.5-325 MG tablet Take 1 tablet by mouth at bedtime.     [provider]  insulin NPH-regular Human (NOVOLIN 70/30) (70-30) 100 UNIT/ML injection Inject 35 Units into the skin 2 (two) times daily with a meal.     [provider]  lisinopril (PRINIVIL,ZESTRIL) 2.5 MG tablet Take 2.5 mg by mouth daily.  06/20/17   [provider]  magnesium gluconate (MAGONATE) 500 MG tablet Take 500 mg by mouth daily.    [provider]  metFORMIN (GLUCOPHAGE) 1000 MG tablet Take 1,000 mg by mouth 2 (two) times daily with a meal.    [provider]  oxyCODONE (OXY IR/ROXICODONE) 5 MG immediate release tablet Take 1-2 tablets (5-10 mg total) by mouth every 4 (four) hours as needed for moderate pain. Patient not taking: Reported on 05/03/2018 09/10/17   Serafina Mitchell, MD  Pirfenidone (ESBRIET) 267 MG CAPS  03/28/18   [provider]  pravastatin (PRAVACHOL) 40 MG tablet Take 40 mg by mouth daily.    [provider]  rOPINIRole (REQUIP) 1 MG tablet Take 1 mg by mouth at bedtime.  04/04/17 04/04/18  [provider]  tamsulosin (FLOMAX) 0.4 MG CAPS capsule TAKE 1 CAPSULE (0.4 MG TOTAL) BY MOUTH ONCE DAILY. TAKE 30 MINUTES AFTER SAME MEAL EACH DAY. 07/04/17   [provider]  tiotropium (SPIRIVA) 18 MCG inhalation capsule Place 18 mcg into inhaler  and inhale daily.    [provider]  VICTOZA 18 MG/3ML SOPN Inject 0.9 mg into the skin daily. 08/12/17   [provider]      VITAL SIGNS:  Blood pressure (!) 133/93, pulse (!) 102, temperature 97.8 F (36.6 C), temperature source Oral, resp. rate 18, height 5\' 7"  (1.702 m), weight 117.9 kg, SpO2 94 %.  PHYSICAL EXAMINATION:  GENERAL:  57 y.o.-year-old patient lying in the bed with no acute distress.  EYES: Pupils equal, round, reactive to light and accommodation. No scleral icterus. Extraocular muscles intact.  HEENT: Head atraumatic, normocephalic. Oropharynx and nasopharynx clear.  NECK:  Supple, no jugular venous distention. No thyroid enlargement, no tenderness.  LUNGS: ' patient has bibasilar crepitations present CARDIOVASCULAR: S1, S2 normal. No murmurs, rubs, or gallops.  ABDOMEN: Soft, nontender, nondistended. Bowel sounds present. No organomegaly or mass.  EXTREMITIES: 2+ pitting edema bilaterally.  NEUROLOGIC: Cranial nerves II through XII are intact. Muscle strength 5/5 in all extremities. Sensation intact. Gait not checked.  PSYCHIATRIC: The patient is alert and oriented x 3.  SKIN: No obvious rash, lesion, or ulcer.   LABORATORY PANEL:   CBC Recent Labs  Lab 07/20/18 1416  WBC 8.0  HGB 12.2*  HCT 39.4  PLT 149*   ------------------------------------------------------------------------------------------------------------------  Chemistries  Recent Labs  Lab 07/20/18 1416  NA 140  K 3.4*  CL 104  CO2 25  GLUCOSE 128*  BUN 24*  CREATININE 1.09  CALCIUM 8.3*   ------------------------------------------------------------------------------------------------------------------  Cardiac Enzymes Recent Labs  Lab 07/20/18 1416  TROPONINI 0.03*   ------------------------------------------------------------------------------------------------------------------  RADIOLOGY:  Dg Chest 2 View  Result Date: 07/20/2018 CLINICAL DATA:  Cough  and shortness of breath EXAM: CHEST - 2 VIEW COMPARISON:  Chest radiograph September 09, 2017 and chest CT Nov 30, 2017 FINDINGS: There are scattered areas of fibrotic change throughout the lungs. There is a small focus of consolidation in the lateral right base. Heart is upper normal in size with pulmonary vascularity normal. Patient is status post coronary artery bypass grafting. No adenopathy. There is degenerative change in the thoracic spine. IMPRESSION: Lateral right base consolidation, likely pneumonia. Underlying areas of fibrosis noted. Stable cardiac silhouette. Status post coronary artery bypass grafting. Electronically Signed   By: Lowella Grip III M.D.   On: 07/20/2018 14:55    EKG:   Orders placed or performed during the hospital encounter of 07/20/18  . ED EKG  . ED EKG  EKG shows normal sinus rhythm with 100 bpm, no ST-T changes.  Poor R wave progression.  IMPRESSION AND PLAN:   57 year old male patient with history of diabetes mellitus type 2, COPD,, morbid obesity, chronic systolic heart failure with EF 40 to 45% by echo done in February 2019 and follows with Dr. Ubaldo Glassing comes in because of worsening shortness of breath, hypoxia. 1.  Acute respiratory failure with hypoxia secondary to acute on chronic systolic heart failure, patient has evidence of orthopnea, PND, pedal edema, increased weight gain.  Chest x-ray did not show much of CHF but clinically he has CHF symptoms.  Continue IV Lasix, check daily weights, I&O's, monitor on telemetry, History of chronic systolic heart failure, follows with Dr. Ubaldo Glassing, continue beta-blockers, ACE inhibitors, diuretics. 3.  History of left carotid artery stenosis status post surgery by Dr. Delana Meyer. #4. history of CAD, CABG, continue aspirin, statins, beta-blockers, ACE inhibitors. 5. idiopathic pulmonary fibrosis, patient did not have any wheezing but showed right-sided pneumonia because he has no fever elevated  white count.  Will give Z-Pak.   Continue bronchodilators.  6/ BPH: Continue Flomax   #7 diabetes mellitus type 2: Patient is on Victoza.    The records are reviewed and case discussed with ED provider. Management plans discussed with the patient, family and they are in agreement.  CODE STATUS: Full code  TOTAL TIME TAKING CARE OF THIS PATIENT: 55 minutes.    Epifanio Lesches M.D on 07/20/2018 at 8:07 PM  Between 7am to 6pm - Pager - [phone]  After 6pm go to [email or web address] - password [email or web address] [facility]  Office  [phone]  CC: Primary care physician; Tracie Harrier, MD  Note: This dictation was prepared with Dragon dictation along with smaller phrase technology. Any transcriptional errors that result from this process are unintentional.

## 2018-07-20 NOTE — ED Notes (Signed)
ED TO INPATIENT HANDOFF REPORT  Name/Age/Gender [name] 57 y.o. male  Code Status    Code Status Orders  (From admission, onward)         Start     Ordered   07/20/18 2004  Full code  Continuous     07/20/18 2006        Code Status History    Date Active Date Inactive Code Status Order ID Comments User Context   09/08/2017 1351 09/10/2017 1823 Full Code [id number]  Katha Cabal, MD Inpatient   06/09/2015 1339 06/09/2015 1910 Full Code [id number]  Schnier, Dolores Lory, MD Inpatient    Advance Directive Documentation     Most Recent Value  Type of Advance Directive  Healthcare Power of Attorney, Living will  Pre-existing out of facility DNR order (yellow form or pink MOST form)  -  "MOST" Form in Place?  -      Home/SNF/Other Home  Chief Complaint low blood pressure  Level of Care/Admitting Diagnosis ED Disposition    ED Disposition Condition [place]: [place] [[id number]]  Level of Care: Telemetry [5]  Diagnosis: Acute respiratory failure with hypoxemia [facility]) [[id number]]  Admitting Physician: Epifanio Lesches [[id number]]  Attending Physician: Epifanio Lesches [[id number]]  Estimated length of stay: past midnight tomorrow  Certification:: I certify this patient will need inpatient services for at least 2 midnights  PT Class (Do Not Modify): Inpatient [101]  PT Acc Code (Do Not Modify): Private [1]       Medical History Past Medical History:  Diagnosis Date  . Cancer ([place])    skin (scalp)  . CHF (congestive heart failure) ([place])   . COPD (chronic obstructive pulmonary disease) ([place])   . Coronary artery disease   . Diabetes mellitus without complication ([place])   . MI (myocardial infarction) ([place])     Allergies No Known Allergies  IV Location/Drains/Wounds Patient Lines/Drains/Airways Status   Active Line/Drains/Airways    Name:   Placement date:   Placement time:   Site:   Days:   Peripheral IV  07/20/18 Left Forearm   07/20/18    1851    Forearm   less than 1   Peripheral IV 07/20/18 Right Antecubital   07/20/18    1854    Antecubital   less than 1   Post Cath / Sheath 06/09/15 Left Arterial;Femoral   06/09/15    1106    Arterial;Femoral   1137   Incision (Closed) 09/08/17 Neck Left   09/08/17    0907     315   Incision - 1 Port    09/08/17    0832     315          Labs/Imaging Results for orders placed or performed during the hospital encounter of 07/20/18 (from the past 48 hour(s))  Basic metabolic panel     Status: Abnormal   Collection Time: 07/20/18  2:16 PM  Result Value Ref Range   Sodium 140 135 - 145 mmol/L   Potassium 3.4 (L) 3.5 - 5.1 mmol/L   Chloride 104 98 - 111 mmol/L   CO2 25 22 - 32 mmol/L   Glucose, Bld 128 (H) 70 - 99 mg/dL   BUN 24 (H) 6 - 20 mg/dL   Creatinine, Ser 1.09 0.61 - 1.24 mg/dL   Calcium 8.3 (L) 8.9 - 10.3 mg/dL   GFR calc non Af Amer >60 >60 mL/min   GFR calc Af  Amer >60 >60 mL/min   Anion gap 11 5 - 15    Comment: Performed at [facility], [address] [place], [place] [address]  CBC     Status: Abnormal   Collection Time: 07/20/18  2:16 PM  Result Value Ref Range   WBC 8.0 4.0 - 10.5 K/uL   RBC 4.08 (L) 4.22 - 5.81 MIL/uL   Hemoglobin 12.2 (L) 13.0 - 17.0 g/dL   HCT 39.4 39.0 - 52.0 %   MCV 96.6 80.0 - 100.0 fL   MCH 29.9 26.0 - 34.0 pg   MCHC 31.0 30.0 - 36.0 g/dL   RDW 14.0 11.5 - 15.5 %   Platelets 149 (L) 150 - 400 K/uL   nRBC 0.0 0.0 - 0.2 %    Comment: Performed at [facility], [address] [place], [place] [address]  Troponin I - ONCE - STAT     Status: Abnormal   Collection Time: 07/20/18  2:16 PM  Result Value Ref Range   Troponin I 0.03 (HH) <0.03 ng/mL    Comment: CRITICAL RESULT CALLED TO, READ BACK BY AND VERIFIED WITH BRANDY DAVIS AT 1510 07/20/18.PMF Performed at [facility], [address] [place], [place] [address]   Influenza panel by PCR (type A & B)     Status: None    Collection Time: 07/20/18  6:53 PM  Result Value Ref Range   Influenza A By PCR NEGATIVE NEGATIVE   Influenza B By PCR NEGATIVE NEGATIVE    Comment: (NOTE) The Xpert Xpress Flu assay is intended as an aid in the diagnosis of  influenza and should not be used as a sole basis for treatment.  This  assay is FDA approved for nasopharyngeal swab specimens only. Nasal  washings and aspirates are unacceptable for Xpert Xpress Flu testing. Performed at [facility], [address] [place], [place] [address]   Brain natriuretic peptide     Status: Abnormal   Collection Time: 07/20/18  6:53 PM  Result Value Ref Range   B Natriuretic Peptide 158.0 (H) 0.0 - 100.0 pg/mL    Comment: Performed at [facility], [address] [place], [place] [address]   Dg Chest 2 View  Result Date: 07/20/2018 CLINICAL DATA:  Cough and shortness of breath EXAM: CHEST - 2 VIEW COMPARISON:  Chest radiograph September 09, 2017 and chest CT Nov 30, 2017 FINDINGS: There are scattered areas of fibrotic change throughout the lungs. There is a small focus of consolidation in the lateral right base. Heart is upper normal in size with pulmonary vascularity normal. Patient is status post coronary artery bypass grafting. No adenopathy. There is degenerative change in the thoracic spine. IMPRESSION: Lateral right base consolidation, likely pneumonia. Underlying areas of fibrosis noted. Stable cardiac silhouette. Status post coronary artery bypass grafting. Electronically Signed   By: Lowella Grip III M.D.   On: 07/20/2018 14:55    Pending Labs Unresulted Labs (From admission, onward)    Start     Ordered   07/27/18 0500  Creatinine, serum  (enoxaparin (LOVENOX)    CrCl >/= 30 ml/min)  Weekly,   STAT    Comments:  while on enoxaparin therapy    07/20/18 2006   07/21/18 2683  Basic metabolic panel  Tomorrow morning,   STAT     07/20/18 2006   07/21/18 0500  CBC  Tomorrow morning,   STAT     07/20/18  2006   07/20/18 2004  CBC  (enoxaparin (LOVENOX)    CrCl >/= 30 ml/min)  Once,   STAT    Comments:  Baseline for enoxaparin therapy IF NOT ALREADY DRAWN.  Notify MD if PLT < 100 K.    07/20/18 2006   07/20/18 2004  Creatinine, serum  (enoxaparin (LOVENOX)    CrCl >/= 30 ml/min)  Once,   STAT    Comments:  Baseline for enoxaparin therapy IF NOT ALREADY DRAWN.    07/20/18 2006   07/20/18 2003  HIV antibody (Routine Testing)  Once,   STAT     07/20/18 2006   07/20/18 1942  Procalcitonin  ONCE - STAT,   STAT     07/20/18 1942   07/20/18 1806  Blood culture (routine x 2)  BLOOD CULTURE X 2,   STAT     07/20/18 1805          Vitals/Pain Today's Vitals   07/20/18 1413 07/20/18 1841 07/20/18 1904 07/20/18 2017  BP:  (!) 133/93  (!) 132/102  Pulse:  (!) 102  (!) 108  Resp:  18  18  Temp:  97.8 F (36.6 C)    TempSrc:  Oral    SpO2:  94% 94% 91%  Weight: 117.9 kg     Height: 5\' 7"  (1.702 m)     PainSc:  6       Isolation Precautions Droplet precaution  Medications Medications  cefTRIAXone (ROCEPHIN) 2 g in sodium chloride 0.9 % 100 mL IVPB (2 g Intravenous New Bag/Given 07/20/18 2022)  azithromycin (ZITHROMAX) 500 mg in sodium chloride 0.9 % 250 mL IVPB (500 mg Intravenous New Bag/Given 07/20/18 2018)  allopurinol (ZYLOPRIM) tablet 100 mg (has no administration in time range)  carvedilol (COREG) tablet 6.25 mg (has no administration in time range)  lisinopril (PRINIVIL,ZESTRIL) tablet 2.5 mg (has no administration in time range)  pravastatin (PRAVACHOL) tablet 40 mg (has no administration in time range)  tamsulosin (FLOMAX) capsule 0.4 mg (has no administration in time range)  acetaminophen (TYLENOL) tablet 650 mg (has no administration in time range)    Or  acetaminophen (TYLENOL) suppository 650 mg (has no administration in time range)  HYDROcodone-acetaminophen (NORCO/VICODIN) 5-325 MG per tablet 1-2 tablet (has no administration in time range)  docusate sodium (COLACE)  capsule 100 mg (has no administration in time range)  bisacodyl (DULCOLAX) EC tablet 5 mg (has no administration in time range)  ondansetron (ZOFRAN) tablet 4 mg (has no administration in time range)    Or  ondansetron (ZOFRAN) injection 4 mg (has no administration in time range)  enoxaparin (LOVENOX) injection 40 mg (has no administration in time range)  furosemide (LASIX) injection 40 mg (has no administration in time range)  ipratropium-albuterol (DUONEB) 0.5-2.5 (3) MG/3ML nebulizer solution 3 mL (has no administration in time range)  ipratropium-albuterol (DUONEB) 0.5-2.5 (3) MG/3ML nebulizer solution 3 mL (3 mLs Nebulization Given 07/20/18 2022)  ipratropium-albuterol (DUONEB) 0.5-2.5 (3) MG/3ML nebulizer solution 3 mL (3 mLs Nebulization Given 07/20/18 2022)  methylPREDNISolone sodium succinate (SOLU-MEDROL) 125 mg/2 mL injection 125 mg (125 mg Intravenous Given 07/20/18 2021)    Mobility walks

## 2018-07-20 NOTE — ED Triage Notes (Addendum)
Pt arrived via POV from [facility] with reports of low blood pressure and low oxygen at Dr. Linton Ham office.  Pt reports he has been short of breath with exertion for the past week, worse when lying down.  Pt denies any chest pain at this time. Pt c/o generalized chronic joint pain.   Denies any fevers. Reports mostly non-productive cough.  Pt able to speak in complete sentences without running out of breath.   Pt states he has hx of IPF.

## 2018-07-21 LAB — GLUCOSE, CAPILLARY
Glucose-Capillary: 329 mg/dL — ABNORMAL HIGH (ref 70–99)
Glucose-Capillary: 463 mg/dL — ABNORMAL HIGH (ref 70–99)
Glucose-Capillary: 466 mg/dL — ABNORMAL HIGH (ref 70–99)
Glucose-Capillary: 376 mg/dL — ABNORMAL HIGH (ref 70–99)
Glucose-Capillary: 194 mg/dL — ABNORMAL HIGH (ref 70–99)
Glucose-Capillary: 132 mg/dL — ABNORMAL HIGH (ref 70–99)

## 2018-07-21 LAB — BASIC METABOLIC PANEL
Anion gap: 11 (ref 5–15)
BUN: 25 mg/dL — ABNORMAL HIGH (ref 6–20)
CO2: 26 mmol/L (ref 22–32)
Calcium: 8.4 mg/dL — ABNORMAL LOW (ref 8.9–10.3)
Chloride: 101 mmol/L (ref 98–111)
Creatinine, Ser: 1.09 mg/dL (ref 0.61–1.24)
Glucose, Bld: 414 mg/dL — ABNORMAL HIGH (ref 70–99)
Potassium: 3.9 mmol/L (ref 3.5–5.1)
Sodium: 138 mmol/L (ref 135–145)

## 2018-07-21 LAB — CBC
HCT: 39 % (ref 39.0–52.0)
Hemoglobin: 11.9 g/dL — ABNORMAL LOW (ref 13.0–17.0)
MCH: 29.6 pg (ref 26.0–34.0)
MCHC: 30.5 g/dL (ref 30.0–36.0)
MCV: 97 fL (ref 80.0–100.0)
PLATELETS: 160 10*3/uL (ref 150–400)
RBC: 4.02 MIL/uL — ABNORMAL LOW (ref 4.22–5.81)
RDW: 14.1 % (ref 11.5–15.5)
WBC: 7.6 10*3/uL (ref 4.0–10.5)
nRBC: 0 % (ref 0.0–0.2)

## 2018-07-21 LAB — HEMOGLOBIN A1C
Hgb A1c MFr Bld: 7.8 % — ABNORMAL HIGH (ref 4.8–5.6)
Mean Plasma Glucose: 177.16 mg/dL

## 2018-07-21 MED ORDER — AZITHROMYCIN 250 MG PO TABS
250.0000 mg | ORAL_TABLET | Freq: Every day | ORAL | Status: DC
  Administered 2018-07-21 – 2018-07-23 (×3): 250 mg via ORAL
  Filled 2018-07-21 (×3): qty 1

## 2018-07-21 MED ORDER — IPRATROPIUM-ALBUTEROL 0.5-2.5 (3) MG/3ML IN SOLN
3.0000 mL | Freq: Three times a day (TID) | RESPIRATORY_TRACT | Status: DC
  Administered 2018-07-21 – 2018-07-24 (×9): 3 mL via RESPIRATORY_TRACT
  Filled 2018-07-21 (×9): qty 3

## 2018-07-21 MED ORDER — INSULIN ASPART 100 UNIT/ML ~~LOC~~ SOLN
SUBCUTANEOUS | Status: AC
  Filled 2018-07-21: qty 1

## 2018-07-21 MED ORDER — INSULIN GLARGINE 100 UNIT/ML ~~LOC~~ SOLN
35.0000 [IU] | Freq: Once | SUBCUTANEOUS | Status: AC
  Administered 2018-07-21: 35 [IU] via SUBCUTANEOUS
  Filled 2018-07-21: qty 0.35

## 2018-07-21 MED ORDER — INSULIN ASPART 100 UNIT/ML ~~LOC~~ SOLN
0.0000 [IU] | Freq: Every day | SUBCUTANEOUS | Status: DC
  Administered 2018-07-22: 2 [IU] via SUBCUTANEOUS
  Filled 2018-07-21: qty 1

## 2018-07-21 MED ORDER — INSULIN ASPART 100 UNIT/ML ~~LOC~~ SOLN
0.0000 [IU] | Freq: Three times a day (TID) | SUBCUTANEOUS | Status: DC
  Administered 2018-07-21 – 2018-07-22 (×3): 4 [IU] via SUBCUTANEOUS
  Administered 2018-07-22: 15 [IU] via SUBCUTANEOUS
  Administered 2018-07-23: 7 [IU] via SUBCUTANEOUS
  Administered 2018-07-23: 11 [IU] via SUBCUTANEOUS
  Administered 2018-07-23 – 2018-07-24 (×2): 4 [IU] via SUBCUTANEOUS
  Filled 2018-07-21 (×8): qty 1

## 2018-07-21 MED ORDER — FUROSEMIDE 40 MG PO TABS
40.0000 mg | ORAL_TABLET | Freq: Two times a day (BID) | ORAL | Status: DC
  Administered 2018-07-21 – 2018-07-22 (×2): 40 mg via ORAL
  Filled 2018-07-21 (×2): qty 1

## 2018-07-21 MED ORDER — INSULIN ASPART 100 UNIT/ML ~~LOC~~ SOLN
30.0000 [IU] | Freq: Once | SUBCUTANEOUS | Status: AC
  Administered 2018-07-21: 30 [IU] via SUBCUTANEOUS
  Filled 2018-07-21: qty 1

## 2018-07-21 MED ORDER — INSULIN ASPART 100 UNIT/ML ~~LOC~~ SOLN
15.0000 [IU] | Freq: Once | SUBCUTANEOUS | Status: AC
  Administered 2018-07-21: 15 [IU] via SUBCUTANEOUS

## 2018-07-21 MED ORDER — BUDESONIDE 0.5 MG/2ML IN SUSP
0.5000 mg | Freq: Two times a day (BID) | RESPIRATORY_TRACT | Status: DC
  Administered 2018-07-21 – 2018-07-24 (×6): 0.5 mg via RESPIRATORY_TRACT
  Filled 2018-07-21 (×6): qty 2

## 2018-07-21 NOTE — Progress Notes (Signed)
Recheck of CBG is 466. MD notified. Lantus 35 units given. I will continue to assess.

## 2018-07-21 NOTE — Progress Notes (Signed)
[place] [facility] [place] NAME: [name]    MR#:  [id number]  DATE OF BIRTH:  01-May-1961  SUBJECTIVE:  Patient is feeling better, no complaints, wife at the bedside  REVIEW OF SYSTEMS:  CONSTITUTIONAL: No fever, fatigue or weakness.  EYES: No blurred or double vision.  EARS, NOSE, AND THROAT: No tinnitus or ear pain.  RESPIRATORY: No cough, shortness of breath, wheezing or hemoptysis.  CARDIOVASCULAR: No chest pain, orthopnea, edema.  GASTROINTESTINAL: No nausea, vomiting, diarrhea or abdominal pain.  GENITOURINARY: No dysuria, hematuria.  ENDOCRINE: No polyuria, nocturia,  HEMATOLOGY: No anemia, easy bruising or bleeding SKIN: No rash or lesion. MUSCULOSKELETAL: No joint pain or arthritis.   NEUROLOGIC: No tingling, numbness, weakness.  PSYCHIATRY: No anxiety or depression.   ROS  DRUG ALLERGIES:  No Known Allergies  VITALS:  Blood pressure 99/76, pulse (!) 110, temperature (!) 97.5 F (36.4 C), temperature source Oral, resp. rate 18, height 5\' 7"  (1.702 m), weight 118.8 kg, SpO2 95 %.  PHYSICAL EXAMINATION:  GENERAL:  57 y.o.-year-old patient lying in the bed with no acute distress.  EYES: Pupils equal, round, reactive to light and accommodation. No scleral icterus. Extraocular muscles intact.  HEENT: Head atraumatic, normocephalic. Oropharynx and nasopharynx clear.  NECK:  Supple, no jugular venous distention. No thyroid enlargement, no tenderness.  LUNGS: Normal breath sounds bilaterally, no wheezing, rales,rhonchi or crepitation. No use of accessory muscles of respiration.  CARDIOVASCULAR: S1, S2 normal. No murmurs, rubs, or gallops.  ABDOMEN: Soft, nontender, nondistended. Bowel sounds present. No organomegaly or mass.  EXTREMITIES: No pedal edema, cyanosis, or clubbing.  NEUROLOGIC: Cranial nerves II through XII are intact. Muscle strength 5/5 in all extremities. Sensation intact. Gait not checked.  PSYCHIATRIC: The patient  is alert and oriented x 3.  SKIN: No obvious rash, lesion, or ulcer.   Physical Exam LABORATORY PANEL:   CBC Recent Labs  Lab 07/21/18 0331  WBC 7.6  HGB 11.9*  HCT 39.0  PLT 160   ------------------------------------------------------------------------------------------------------------------  Chemistries  Recent Labs  Lab 07/21/18 0331  NA 138  K 3.9  CL 101  CO2 26  GLUCOSE 414*  BUN 25*  CREATININE 1.09  CALCIUM 8.4*   ------------------------------------------------------------------------------------------------------------------  Cardiac Enzymes Recent Labs  Lab 07/20/18 1416  TROPONINI 0.03*   ------------------------------------------------------------------------------------------------------------------  RADIOLOGY:  Dg Chest 2 View  Result Date: 07/20/2018 CLINICAL DATA:  Cough and shortness of breath EXAM: CHEST - 2 VIEW COMPARISON:  Chest radiograph September 09, 2017 and chest CT Nov 30, 2017 FINDINGS: There are scattered areas of fibrotic change throughout the lungs. There is a small focus of consolidation in the lateral right base. Heart is upper normal in size with pulmonary vascularity normal. Patient is status post coronary artery bypass grafting. No adenopathy. There is degenerative change in the thoracic spine. IMPRESSION: Lateral right base consolidation, likely pneumonia. Underlying areas of fibrosis noted. Stable cardiac silhouette. Status post coronary artery bypass grafting. Electronically Signed   By: Lowella Grip III M.D.   On: 07/20/2018 14:55    ASSESSMENT AND PLAN:  57 year old male patient with history of diabetes mellitus type 2, COPD,, morbid obesity, chronic systolic heart failure with EF 40 to 45% by echo done in February 2019 and follows with Dr. Ubaldo Glassing comes in because of worsening shortness of breath, hypoxia  *Acute hypoxic respiratory failure Secondary primarily to pneumonia Supplemental oxygen wean as tolerated  *Acute  CAP Continue pneumonia protocol, empiric Rocephin/azithromycin, follow-up on cultures  *  Acute on chronic systolic congestive heart failure exacerbation Resolved BNP is normal, chest x-ray noted for pneumonia Change Lasix to p.o., Coreg, lisinopril, aspirin, strict I&O monitoring, daily weights, follow-up on echocardiogram  *History of left carotid artery stenosis  Stable status post surgery by Dr. Delana Meyer  *history of CAD, CABG Stable continue aspirin, statins, beta-blockers, ACE inhibitors  *idiopathic pulmonary fibrosis Exacerbated by pneumonia Did receive IV Solu-Medrol in the emergency room Pulmicort twice daily, breathing treatments, empiric azithromycin for now, respiratory therapy to see  *Chronic BPH Continue Flomax  *Acute hyperglycemia with history diabetes mellitus type 2 Exacerbated by steroid use Start high-dose sliding scale insulin, Lantus subcu x1 now, Accu-Cheks per routine, check hemoglobin A1c determine level control, carbohydrate consistent diet On Victoza at home  Disposition Home in 1 to 2 days once weaned off oxygen    All the records are reviewed and case discussed with Care Management/Social Workerr. Management plans discussed with the patient, family and they are in agreement.  CODE STATUS: dnr  TOTAL TIME TAKING CARE OF THIS PATIENT: 35 minutes.     POSSIBLE D/C IN 1-2 DAYS, DEPENDING ON CLINICAL CONDITION.   Avel Peace Katesha Eichel M.D on 07/21/2018   Between 7am to 6pm - Pager - [phone]  After 6pm go to [email or web address] - password [email or web address] [facility]  Office  [phone]  CC: Primary care physician; Tracie Harrier, MD  Note: This dictation was prepared with Dragon dictation along with smaller phrase technology. Any transcriptional errors that result from this process are unintentional.

## 2018-07-21 NOTE — Progress Notes (Signed)
MD notified. Pts CBG is >400 orders to give 30 units of regular insulin and recheck in one hour. I will continue to assess.

## 2018-07-21 NOTE — Plan of Care (Signed)
  Problem: Pain Managment: Goal: General experience of comfort will improve Outcome: Progressing   Problem: Safety: Goal: Ability to remain free from injury will improve Outcome: Progressing   Problem: Clinical Measurements: Goal: Respiratory complications will improve Outcome: Not Progressing Note:  Patient requiring 2L of oxygen via nasal cannula.

## 2018-07-21 NOTE — Progress Notes (Signed)
Patient sitting up in bed speaking full sentences, no distress. SAT at 85%, clear diminished breath sounds.  Placed on 2L [place] for O2 support.

## 2018-07-21 NOTE — Progress Notes (Signed)
PHARMACIST - PHYSICIAN COMMUNICATION DR:   Salary CONCERNING: Antibiotic IV to Oral Route Change Policy  RECOMMENDATION: This patient is receiving azithromycin by the intravenous route.  Based on criteria approved by the Pharmacy and Therapeutics Committee, the antibiotic(s) is/are being converted to the equivalent oral dose form(s).   DESCRIPTION: These criteria include:  Patient being treated for a respiratory tract infection, urinary tract infection, cellulitis or clostridium difficile associated diarrhea if on metronidazole  The patient is not neutropenic and does not exhibit a GI malabsorption state  The patient is eating (either orally or via tube) and/or has been taking other orally administered medications for a least 24 hours  The patient is improving clinically and has a Tmax < 100.5  If you have questions about this conversion, please contact the Pharmacy Department  []  ( [phone] )  [place] [x]  ( [phone] )  [facility] []  ( [phone]  [place] []  ( [phone] )  [facility] []  ( [phone] )  [facility]   

## 2018-07-21 NOTE — Progress Notes (Signed)
MD notified CBG of 376 after interventions. MD acknowledged. No new orders at this time. I will continue to assess.

## 2018-07-22 LAB — GLUCOSE, CAPILLARY
Glucose-Capillary: 192 mg/dL — ABNORMAL HIGH (ref 70–99)
Glucose-Capillary: 316 mg/dL — ABNORMAL HIGH (ref 70–99)
Glucose-Capillary: 192 mg/dL — ABNORMAL HIGH (ref 70–99)
Glucose-Capillary: 206 mg/dL — ABNORMAL HIGH (ref 70–99)

## 2018-07-22 MED ORDER — FUROSEMIDE 20 MG PO TABS
20.0000 mg | ORAL_TABLET | Freq: Two times a day (BID) | ORAL | Status: DC
  Administered 2018-07-22 – 2018-07-23 (×2): 20 mg via ORAL
  Filled 2018-07-22 (×2): qty 1

## 2018-07-22 MED ORDER — SODIUM CHLORIDE 0.9 % IV BOLUS
500.0000 mL | Freq: Once | INTRAVENOUS | Status: AC
  Administered 2018-07-22: 500 mL via INTRAVENOUS

## 2018-07-22 MED ORDER — TIOTROPIUM BROMIDE MONOHYDRATE 18 MCG IN CAPS
18.0000 ug | ORAL_CAPSULE | Freq: Every day | RESPIRATORY_TRACT | Status: DC
  Administered 2018-07-22 – 2018-07-24 (×3): 18 ug via RESPIRATORY_TRACT
  Filled 2018-07-22: qty 5

## 2018-07-22 MED ORDER — PIRFENIDONE 267 MG PO CAPS
801.0000 mg | ORAL_CAPSULE | Freq: Two times a day (BID) | ORAL | Status: DC
  Administered 2018-07-22 – 2018-07-24 (×5): 801 mg via ORAL
  Filled 2018-07-22: qty 3

## 2018-07-22 MED ORDER — INSULIN ASPART PROT & ASPART (70-30 MIX) 100 UNIT/ML ~~LOC~~ SUSP
30.0000 [IU] | Freq: Two times a day (BID) | SUBCUTANEOUS | Status: DC
  Administered 2018-07-22 – 2018-07-24 (×4): 30 [IU] via SUBCUTANEOUS
  Filled 2018-07-22 (×4): qty 10

## 2018-07-22 MED ORDER — ASPIRIN EC 81 MG PO TBEC
81.0000 mg | DELAYED_RELEASE_TABLET | Freq: Every day | ORAL | Status: DC
  Administered 2018-07-22 – 2018-07-24 (×3): 81 mg via ORAL
  Filled 2018-07-22 (×3): qty 1

## 2018-07-22 MED ORDER — MAGNESIUM GLUCONATE 500 MG PO TABS
500.0000 mg | ORAL_TABLET | Freq: Every day | ORAL | Status: DC
  Administered 2018-07-22 – 2018-07-24 (×3): 500 mg via ORAL
  Filled 2018-07-22 (×3): qty 1

## 2018-07-22 MED ORDER — SODIUM CHLORIDE 0.9 % IV SOLN
INTRAVENOUS | Status: DC | PRN
  Administered 2018-07-22: 500 mL via INTRAVENOUS

## 2018-07-22 NOTE — Consult Note (Signed)
Reason for Consult: Shortness of breath hypoxemia possible heart failure Referring Physician: Dr. Vianne Bulls hospitalist Dr. Ginette Pitman primary Cardiologist Dr. Roswell Miners is an 57 y.o. male.  HPI: Patient is a 57 year old white male with history of congestive heart failure systolic dysfunction COPD idiopathic pulmonary fibrosis shortness of breath.  Was doing reasonably well but got progressively short of breath has had worsening leg edema difficult to control blood pressure admitted with shortness of breath echo oxygen saturations of 80 placed on supplemental oxygen therapy denies any chest pain but has had significant leg swelling troponins were unremarkable chest x-ray was concerning for basilar pneumonia so he was admitted and placed on antibiotic therapy as well as steroid tapering.  Patient has history of mild cardiomyopathy ejection fraction around 40 to 45% history of coronary bypass surgery at [facility] with postop dehiscence and infection now doing reasonably well complains of dyspnea on exertion mostly denies any sputum production minimal to no chest pain.  Past Medical History:  Diagnosis Date  . Cancer ([place])    skin (scalp)  . CHF (congestive heart failure) ([place])   . COPD (chronic obstructive pulmonary disease) ([place])   . Coronary artery disease   . Diabetes mellitus without complication ([place])   . MI (myocardial infarction) [facility])     Past Surgical History:  Procedure Laterality Date  . APPLICATION OF WOUND VAC     chest wound s/p CABG  . CARDIAC CATHETERIZATION    . CORONARY ARTERY BYPASS GRAFT  2015   3 vessels  . ENDARTERECTOMY Left 09/08/2017   Procedure: ENDARTERECTOMY CAROTID;  Surgeon: Katha Cabal, MD;  Location: [facility] ORS;  Service: Vascular;  Laterality: Left;  . PERIPHERAL VASCULAR CATHETERIZATION N/A 06/09/2015   Procedure: Abdominal Aortogram w/Lower Extremity;  Surgeon: Katha Cabal, MD;  Location: [place] [facility];  Service: Cardiovascular;   Laterality: N/A;  . PERIPHERAL VASCULAR CATHETERIZATION  06/09/2015   Procedure: Lower Extremity Intervention;  Surgeon: Katha Cabal, MD;  Location: [place] [facility];  Service: Cardiovascular;;  . stent in leg Left   . WOUND DEBRIDEMENT     debridement of chest wound x 2 s/p CABG     Family History  Problem Relation Age of Onset  . Heart attack Mother   . Cancer Father     Social History:  reports that he has quit smoking. He has never used smokeless tobacco. He reports current alcohol use. He reports that he does not use drugs.  Allergies: No Known Allergies  Medications: I have reviewed the patient's current medications.  Results for orders placed or performed during the hospital encounter of 07/20/18 (from the past 48 hour(s))  Influenza panel by PCR (type A & B)     Status: None   Collection Time: 07/20/18  6:53 PM  Result Value Ref Range   Influenza A By PCR NEGATIVE NEGATIVE   Influenza B By PCR NEGATIVE NEGATIVE    Comment: (NOTE) The Xpert Xpress Flu assay is intended as an aid in the diagnosis of  influenza and should not be used as a sole basis for treatment.  This  assay is FDA approved for nasopharyngeal swab specimens only. Nasal  washings and aspirates are unacceptable for Xpert Xpress Flu testing. Performed at [facility], [address] [place], [place] [address]   Brain natriuretic peptide     Status: Abnormal   Collection Time: 07/20/18  6:53 PM  Result Value Ref Range   B Natriuretic Peptide  158.0 (H) 0.0 - 100.0 pg/mL    Comment: Performed at [facility], [address] [place], [place] [address]  Blood culture (routine x 2)     Status: None (Preliminary result)   Collection Time: 07/20/18  6:53 PM  Result Value Ref Range   Specimen Description BLOOD RIGHT ANTECUBITAL    Special Requests      BOTTLES DRAWN AEROBIC AND ANAEROBIC Blood Culture results may not be optimal due to an excessive volume of blood received in  culture bottles   Culture      NO GROWTH 2 DAYS Performed at [facility], [address], [place], [place] [address]    Report Status PENDING   Blood culture (routine x 2)     Status: None (Preliminary result)   Collection Time: 07/20/18  6:53 PM  Result Value Ref Range   Specimen Description BLOOD BLOOD LEFT FOREARM    Special Requests      BOTTLES DRAWN AEROBIC AND ANAEROBIC Blood Culture adequate volume   Culture      NO GROWTH 2 DAYS Performed at [facility], [address] [place], [place] [address]    Report Status PENDING   Procalcitonin     Status: None   Collection Time: 07/20/18  6:57 PM  Result Value Ref Range   Procalcitonin <0.10 ng/mL    Comment:        Interpretation: PCT (Procalcitonin) <= 0.5 ng/mL: Systemic infection (sepsis) is not likely. Local bacterial infection is possible. (NOTE)       Sepsis PCT Algorithm           Lower Respiratory Tract                                      Infection PCT Algorithm    ----------------------------     ----------------------------         PCT < 0.25 ng/mL                PCT < 0.10 ng/mL         Strongly encourage             Strongly discourage   discontinuation of antibiotics    initiation of antibiotics    ----------------------------     -----------------------------       PCT 0.25 - 0.50 ng/mL            PCT 0.10 - 0.25 ng/mL               OR       >80% decrease in PCT            Discourage initiation of                                            antibiotics      Encourage discontinuation           of antibiotics    ----------------------------     -----------------------------         PCT >= 0.50 ng/mL              PCT 0.26 - 0.50 ng/mL               AND        <80% decrease in  PCT             Encourage initiation of                                             antibiotics       Encourage continuation           of antibiotics    ----------------------------      -----------------------------        PCT >= 0.50 ng/mL                  PCT > 0.50 ng/mL               AND         increase in PCT                  Strongly encourage                                      initiation of antibiotics    Strongly encourage escalation           of antibiotics                                     -----------------------------                                           PCT <= 0.25 ng/mL                                                 OR                                        > 80% decrease in PCT                                     Discontinue / Do not initiate                                             antibiotics Performed at [facility], [address] [place], [place] [address]   Glucose, capillary     Status: Abnormal   Collection Time: 07/20/18  9:27 PM  Result Value Ref Range   Glucose-Capillary 150 (H) 70 - 99 mg/dL  Basic metabolic panel     Status: Abnormal   Collection Time: 07/21/18  3:31 AM  Result Value Ref Range   Sodium 138 135 - 145 mmol/L   Potassium 3.9 3.5 - 5.1 mmol/L   Chloride 101 98 - 111 mmol/L   CO2 26 22 - 32 mmol/L   Glucose, Bld 414 (H) 70 - 99 mg/dL   BUN  25 (H) 6 - 20 mg/dL   Creatinine, Ser 1.09 0.61 - 1.24 mg/dL   Calcium 8.4 (L) 8.9 - 10.3 mg/dL   GFR calc non Af Amer >60 >60 mL/min   GFR calc Af Amer >60 >60 mL/min   Anion gap 11 5 - 15    Comment: Performed at [facility], [address] [place], [place] [address]  CBC     Status: Abnormal   Collection Time: 07/21/18  3:31 AM  Result Value Ref Range   WBC 7.6 4.0 - 10.5 K/uL   RBC 4.02 (L) 4.22 - 5.81 MIL/uL   Hemoglobin 11.9 (L) 13.0 - 17.0 g/dL   HCT 39.0 39.0 - 52.0 %   MCV 97.0 80.0 - 100.0 fL   MCH 29.6 26.0 - 34.0 pg   MCHC 30.5 30.0 - 36.0 g/dL   RDW 14.1 11.5 - 15.5 %   Platelets 160 150 - 400 K/uL   nRBC 0.0 0.0 - 0.2 %    Comment: Performed at [facility], [address] [place], [place] [address]  Hemoglobin A1c      Status: Abnormal   Collection Time: 07/21/18  3:31 AM  Result Value Ref Range   Hgb A1c MFr Bld 7.8 (H) 4.8 - 5.6 %    Comment: (NOTE) Pre diabetes:          5.7%-6.4% Diabetes:              >6.4% Glycemic control for   <7.0% adults with diabetes    Mean Plasma Glucose 177.16 mg/dL    Comment: Performed at [facility], [place] [address], [place], [place] [address]  Glucose, capillary     Status: Abnormal   Collection Time: 07/21/18  7:49 AM  Result Value Ref Range   Glucose-Capillary 329 (H) 70 - 99 mg/dL   Comment 1 Notify RN    Comment 2 Document in Chart   Glucose, capillary     Status: Abnormal   Collection Time: 07/21/18 11:35 AM  Result Value Ref Range   Glucose-Capillary 521 (HH) 70 - 99 mg/dL   Comment 1 Notify RN    Comment 2 Document in Chart   Glucose, capillary     Status: Abnormal   Collection Time: 07/21/18 11:38 AM  Result Value Ref Range   Glucose-Capillary 463 (H) 70 - 99 mg/dL   Comment 1 Notify RN    Comment 2 Document in Chart   Glucose, capillary     Status: Abnormal   Collection Time: 07/21/18 12:37 PM  Result Value Ref Range   Glucose-Capillary 466 (H) 70 - 99 mg/dL   Comment 1 Notify RN   Glucose, capillary     Status: Abnormal   Collection Time: 07/21/18  1:38 PM  Result Value Ref Range   Glucose-Capillary 376 (H) 70 - 99 mg/dL   Comment 1 Notify RN   Glucose, capillary     Status: Abnormal   Collection Time: 07/21/18  4:33 PM  Result Value Ref Range   Glucose-Capillary 194 (H) 70 - 99 mg/dL   Comment 1 Notify RN    Comment 2 Document in Chart   Glucose, capillary     Status: Abnormal   Collection Time: 07/21/18  9:06 PM  Result Value Ref Range   Glucose-Capillary 132 (H) 70 - 99 mg/dL  Glucose, capillary     Status: Abnormal   Collection Time: 07/22/18  7:48 AM  Result Value Ref Range   Glucose-Capillary  192 (H) 70 - 99 mg/dL  Glucose, capillary     Status: Abnormal   Collection Time: 07/22/18 11:38 AM  Result Value Ref Range    Glucose-Capillary 316 (H) 70 - 99 mg/dL  Glucose, capillary     Status: Abnormal   Collection Time: 07/22/18  4:52 PM  Result Value Ref Range   Glucose-Capillary 192 (H) 70 - 99 mg/dL    No results found.  Review of Systems  Constitutional: Positive for diaphoresis and malaise/fatigue.  HENT: Positive for congestion.   Eyes: Negative.   Respiratory: Positive for cough and shortness of breath.   Cardiovascular: Positive for orthopnea, leg swelling and PND.  Gastrointestinal: Negative.   Genitourinary: Negative.   Musculoskeletal: Negative.   Skin: Negative.   Neurological: Negative.   Endo/Heme/Allergies: Negative.   Psychiatric/Behavioral: Negative.    Blood pressure 121/71, pulse (!) 118, temperature 98.4 F (36.9 C), resp. rate 18, height 5\' 7"  (1.702 m), weight 120 kg, SpO2 95 %. Physical Exam  Nursing note and vitals reviewed. Constitutional: He is oriented to person, place, and time. He appears well-developed and well-nourished.  HENT:  Head: Normocephalic and atraumatic.  Eyes: Pupils are equal, round, and reactive to light. Conjunctivae and EOM are normal.  Neck: Normal range of motion. Neck supple.  Cardiovascular: Normal rate, regular rhythm and normal heart sounds.  Respiratory: Effort normal. He has decreased breath sounds. He has rhonchi.  Musculoskeletal: Normal range of motion.        General: Edema present.  Neurological: He is alert and oriented to person, place, and time. He has normal reflexes.  Skin: Skin is warm and dry.  Psychiatric: He has a normal mood and affect.    Assessment/Plan: Respiratory failure Diffuse pneumonia Pulmonary fibrosis COPD Hypoxemia Shortness of breath Leg edema Coronary artery disease History of coronary bypass surgery Peripheral vascular disease including status post CEA Borderline obesity Smoker Congestive heart failure . Agree with admit to telemetry Continue with with supplemental oxygen Agree with  broad-spectrum antibiotic therapy for pneumonia bronchitis Continue steroid therapy for pulmonary fibrosis bronchitis pneumonia Continue diabetes management and control Inhalers as necessary to help with reactive airway disease Diuretic therapy for leg edema , Support stockings and elevation No clear indication for invasive strategy Recommend heart failure therapy for cardiomyopathy systolic in nature ACE or ARB beta-blocker diuretics Consider pulmonary input   Kenleigh Toback D Ashlan Dignan 07/22/2018, 6:06 PM

## 2018-07-22 NOTE — Progress Notes (Signed)
Patient HR sustaining 120s at rest. MD Jannifer [place] notified. Per MD, give 500 bolus. Will administer as ordered.   Update: Patient HR still sustaining 120s at rest after administration of 500 cc bolus. MD Marcille Blanco notified. Order given for another 500 cc bolus.   Update: Patient HR 90-100s after receiving second bolus. Will continue to monitor.  [name]

## 2018-07-22 NOTE — Progress Notes (Signed)
[facility] [place] NAME: [name]    MR#:  [id number]  DATE OF BIRTH:  01-17-1961  SUBJECTIVE:  Patient feels better but still has shortness of breath and leg edema.  He was given normal saline bolus 500 cc twice due to tachycardia last night.  He is put back on oxygen 2 L. REVIEW OF SYSTEMS:  CONSTITUTIONAL: No fever, fatigue or weakness.  EYES: No blurred or double vision.  EARS, NOSE, AND THROAT: No tinnitus or ear pain.  RESPIRATORY: Has cough, shortness of breath, no wheezing or hemoptysis.  CARDIOVASCULAR: No chest pain, orthopnea, but has bilateral leg edema.  GASTROINTESTINAL: No nausea, vomiting, diarrhea or abdominal pain.  GENITOURINARY: No dysuria, hematuria.  ENDOCRINE: No polyuria, nocturia,  HEMATOLOGY: No anemia, easy bruising or bleeding SKIN: No rash or lesion. MUSCULOSKELETAL: No joint pain or arthritis.   NEUROLOGIC: No tingling, numbness, weakness.  PSYCHIATRY: No anxiety or depression.   ROS  DRUG ALLERGIES:  No Known Allergies  VITALS:  Blood pressure 108/88, pulse (!) 103, temperature 98.4 F (36.9 C), resp. rate 18, height 5\' 7"  (1.702 m), weight 120 kg, SpO2 91 %.  PHYSICAL EXAMINATION:  GENERAL:  56 y.o.-year-old patient lying in the bed with no acute distress.  Morbid obesity. EYES: Pupils equal, round, reactive to light and accommodation. No scleral icterus. Extraocular muscles intact.  HEENT: Head atraumatic, normocephalic. Oropharynx and nasopharynx clear.  NECK:  Supple, no jugular venous distention. No thyroid enlargement, no tenderness.  LUNGS: Normal breath sounds bilaterally, no wheezing, rales,rhonchi or crepitation. No use of accessory muscles of respiration.  CARDIOVASCULAR: S1, S2 normal. No murmurs, rubs, or gallops.  ABDOMEN: Soft, nontender, nondistended. Bowel sounds present. No organomegaly or mass.  EXTREMITIES: No cyanosis, or clubbing.  Bilateral leg edema 2+. NEUROLOGIC: Cranial  nerves II through XII are intact. Muscle strength 5/5 in all extremities. Sensation intact. Gait not checked.  PSYCHIATRIC: The patient is alert and oriented x 3.  SKIN: No obvious rash, lesion, or ulcer.   Physical Exam LABORATORY PANEL:   CBC Recent Labs  Lab 07/21/18 0331  WBC 7.6  HGB 11.9*  HCT 39.0  PLT 160   ------------------------------------------------------------------------------------------------------------------  Chemistries  Recent Labs  Lab 07/21/18 0331  NA 138  K 3.9  CL 101  CO2 26  GLUCOSE 414*  BUN 25*  CREATININE 1.09  CALCIUM 8.4*   ------------------------------------------------------------------------------------------------------------------  Cardiac Enzymes Recent Labs  Lab 07/20/18 1416  TROPONINI 0.03*   ------------------------------------------------------------------------------------------------------------------  RADIOLOGY:  No results found.  ASSESSMENT AND PLAN:  57 year old male patient with history of diabetes mellitus type 2, COPD,, morbid obesity, chronic systolic heart failure with EF 40 to 45% by echo done in February 2019 and follows with Dr. Ubaldo Glassing comes in because of worsening shortness of breath, hypoxia  *Acute hypoxic respiratory failure Secondary primarily to pneumonia Supplemental oxygen wean as tolerated, NEB prn.  *Acute CAP Continue pneumonia protocol, empiric Rocephin/azithromycin, blood cultures negative so far.  *Acute on chronic systolic congestive heart failure exacerbation BNP is normal, chest x-ray noted for pneumonia Decrease Lasix to p.o 20 mg twice daily, hold Coreg, lisinopril due to low side blood pressure, strict I&O monitoring, daily weights. Cardiology consult.  *History of left carotid artery stenosis  Stable status post surgery by Dr. Delana Meyer  *history of CAD, CABG Stable continue aspirin, statins, beta-blockers, ACE inhibitors  *idiopathic pulmonary fibrosis Exacerbated by  pneumonia Did receive IV Solu-Medrol in the emergency room Pulmicort twice daily, breathing  treatments, empiric azithromycin. Continue home medication.  *Chronic BPH Continue Flomax  *Hyperglycemia with history diabetes mellitus type 2 Exacerbated by steroid use Continue high-dose sliding scale insulin, resume home NovoLog 70/30. Hold metformin, glipizide and Victoza.  All the records are reviewed and case discussed with Care Management/Social Workerr. Management plans discussed with the patient, his wife and the son and they are in agreement.  CODE STATUS: dnr  TOTAL TIME TAKING CARE OF THIS PATIENT: 36 minutes.     POSSIBLE D/C IN 2 DAYS, DEPENDING ON CLINICAL CONDITION.   Demetrios Loll M.D on 07/22/2018   Between 7am to 6pm - Pager - [phone]  After 6pm go to [email or web address] - password [email or web address] [facility]  Office  [phone]  CC: Primary care physician; Tracie Harrier, MD  Note: This dictation was prepared with Dragon dictation along with smaller phrase technology. Any transcriptional errors that result from this process are unintentional.

## 2018-07-22 NOTE — Progress Notes (Signed)
SATURATION QUALIFICATIONS: (This note is used to comply with regulatory documentation for home oxygen)  Patient Saturations on Room Air at Rest = 82%  Patient Saturations on Room Air while Ambulating = n/a%  Patient Saturations on 2 Liters of oxygen while restting = 94%  Please briefly explain why patient needs home oxygen:

## 2018-07-23 LAB — BASIC METABOLIC PANEL
Anion gap: 8 (ref 5–15)
BUN: 26 mg/dL — ABNORMAL HIGH (ref 6–20)
CO2: 27 mmol/L (ref 22–32)
Calcium: 8.5 mg/dL — ABNORMAL LOW (ref 8.9–10.3)
Chloride: 102 mmol/L (ref 98–111)
Creatinine, Ser: 0.96 mg/dL (ref 0.61–1.24)
GFR calc Af Amer: >60 mL/min (ref >60)
GFR calc non Af Amer: >60 mL/min (ref >60)
GLUCOSE: 174 mg/dL — AB (ref 70–99)
Potassium: 4.2 mmol/L (ref 3.5–5.1)
Sodium: 137 mmol/L (ref 135–145)

## 2018-07-23 LAB — GLUCOSE, CAPILLARY
GLUCOSE-CAPILLARY: 521 mg/dL — AB (ref 70–99)
Glucose-Capillary: 158 mg/dL — ABNORMAL HIGH (ref 70–99)
Glucose-Capillary: 280 mg/dL — ABNORMAL HIGH (ref 70–99)
Glucose-Capillary: 205 mg/dL — ABNORMAL HIGH (ref 70–99)
Glucose-Capillary: 192 mg/dL — ABNORMAL HIGH (ref 70–99)

## 2018-07-23 LAB — HIV ANTIBODY (ROUTINE TESTING W REFLEX): HIV Screen 4th Generation wRfx: NONREACTIVE

## 2018-07-23 LAB — MAGNESIUM: Magnesium: 2 mg/dL (ref 1.7–2.4)

## 2018-07-23 MED ORDER — PREMIER PROTEIN SHAKE
11.0000 [oz_av] | Freq: Two times a day (BID) | ORAL | Status: DC
  Administered 2018-07-23 – 2018-07-24 (×2): 11 [oz_av] via ORAL

## 2018-07-23 MED ORDER — CEFDINIR 300 MG PO CAPS
300.0000 mg | ORAL_CAPSULE | Freq: Two times a day (BID) | ORAL | Status: DC
  Administered 2018-07-23 – 2018-07-24 (×3): 300 mg via ORAL
  Filled 2018-07-23 (×4): qty 1

## 2018-07-23 MED ORDER — CARVEDILOL 3.125 MG PO TABS
3.1250 mg | ORAL_TABLET | Freq: Two times a day (BID) | ORAL | Status: DC
  Filled 2018-07-23: qty 1

## 2018-07-23 MED ORDER — ENOXAPARIN SODIUM 40 MG/0.4ML ~~LOC~~ SOLN
40.0000 mg | Freq: Two times a day (BID) | SUBCUTANEOUS | Status: DC
  Administered 2018-07-23 (×2): 40 mg via SUBCUTANEOUS
  Filled 2018-07-23 (×2): qty 0.4

## 2018-07-23 MED ORDER — FUROSEMIDE 20 MG PO TABS
20.0000 mg | ORAL_TABLET | Freq: Three times a day (TID) | ORAL | Status: DC
  Administered 2018-07-23 – 2018-07-24 (×3): 20 mg via ORAL
  Filled 2018-07-23 (×3): qty 1

## 2018-07-23 MED ORDER — INSULIN ASPART 100 UNIT/ML ~~LOC~~ SOLN
5.0000 [IU] | Freq: Three times a day (TID) | SUBCUTANEOUS | Status: DC
  Administered 2018-07-23 – 2018-07-24 (×2): 5 [IU] via SUBCUTANEOUS
  Filled 2018-07-23 (×2): qty 1

## 2018-07-23 NOTE — Care Management Important Message (Signed)
Copy of signed IM left with patient in room.  

## 2018-07-23 NOTE — Care Management Note (Addendum)
Case Management Note  Patient Details  Name: [name] MRN: [id number] Date of Birth: 03/19/1961  Subjective/Objective:     Patient is from home with wife.  Admitted with CHF exacerbation; history of pulmonary fibrosis, COPD.  Continues on IV antibiotics for pneumonia protocol. Patient has a functioning scale at home but does not weigh daily;  Educated patient on importance of daily weights.  Current with his PCP, cardiologist and Pulmonologist.  Does not use a walker or a cane at home.  Has qualified for home oxygen;  Referral made to [facility] with [facility].  Denies difficulties obtaining medications or with accessing medical care.  Offered home health services and patient has declined.  Has a heart failure clinic appointment scheduled.  No further needs identified at this time.  Plan to discharge tomorrow per patient.    [facility] referral made.               Action/Plan:   Expected Discharge Date:                  Expected Discharge Plan:  Home/Self Care  In-House Referral:     Discharge planning Services  CM Consult, HF Clinic  Post Acute Care Choice:    Choice offered to:     DME Arranged:  Oxygen DME Agency:  [place]:    [facility] Agency:     Status of Service:  Completed, signed off  If discussed at [place] of Stay Meetings, dates discussed:    Additional Comments:  Elza Rafter, RN 07/23/2018, 2:09 PM

## 2018-07-23 NOTE — Progress Notes (Addendum)
[facility] [place] NAME: [name]    MR#:  [id number]  DATE OF BIRTH:  1961/01/19  SUBJECTIVE:  Patient has better shortness of breath and leg edema.  But he is hypoxia with O2 saturation at 80s in room air.  Back on oxygen 2 L by nasal cannula. REVIEW OF SYSTEMS:  CONSTITUTIONAL: No fever, fatigue or weakness.  EYES: No blurred or double vision.  EARS, NOSE, AND THROAT: No tinnitus or ear pain.  RESPIRATORY: Has cough, shortness of breath, no wheezing or hemoptysis.  CARDIOVASCULAR: No chest pain, orthopnea, but has bilateral leg edema.  GASTROINTESTINAL: No nausea, vomiting, diarrhea or abdominal pain.  GENITOURINARY: No dysuria, hematuria.  ENDOCRINE: No polyuria, nocturia,  HEMATOLOGY: No anemia, easy bruising or bleeding SKIN: No rash or lesion. MUSCULOSKELETAL: No joint pain or arthritis.   NEUROLOGIC: No tingling, numbness, weakness.  PSYCHIATRY: No anxiety or depression.   ROS  DRUG ALLERGIES:  No Known Allergies  VITALS:  Blood pressure 114/78, pulse (!) 107, temperature 98 F (36.7 C), temperature source Oral, resp. rate 18, height 5\' 7"  (1.702 m), weight 120.8 kg, SpO2 92 %.  PHYSICAL EXAMINATION:  GENERAL:  57 y.o.-year-old patient lying in the bed with no acute distress.  Morbid obesity. EYES: Pupils equal, round, reactive to light and accommodation. No scleral icterus. Extraocular muscles intact.  HEENT: Head atraumatic, normocephalic. Oropharynx and nasopharynx clear.  NECK:  Supple, no jugular venous distention. No thyroid enlargement, no tenderness.  LUNGS: Normal breath sounds bilaterally, no wheezing, rales,rhonchi or crepitation. No use of accessory muscles of respiration.  CARDIOVASCULAR: S1, S2 normal. No murmurs, rubs, or gallops.  ABDOMEN: Soft, nontender, nondistended. Bowel sounds present. No organomegaly or mass.  EXTREMITIES: No cyanosis, or clubbing.  Bilateral leg edema 2+. NEUROLOGIC: Cranial  nerves II through XII are intact. Muscle strength 5/5 in all extremities. Sensation intact. Gait not checked.  PSYCHIATRIC: The patient is alert and oriented x 3.  SKIN: No obvious rash, lesion, or ulcer.   Physical Exam LABORATORY PANEL:   CBC Recent Labs  Lab 07/21/18 0331  WBC 7.6  HGB 11.9*  HCT 39.0  PLT 160   ------------------------------------------------------------------------------------------------------------------  Chemistries  Recent Labs  Lab 07/23/18 0823  NA 137  K 4.2  CL 102  CO2 27  GLUCOSE 174*  BUN 26*  CREATININE 0.96  CALCIUM 8.5*  MG 2.0   ------------------------------------------------------------------------------------------------------------------  Cardiac Enzymes Recent Labs  Lab 07/20/18 1416  TROPONINI 0.03*   ------------------------------------------------------------------------------------------------------------------  RADIOLOGY:  No results found.  ASSESSMENT AND PLAN:  57 year old male patient with history of diabetes mellitus type 2, COPD,, morbid obesity, chronic systolic heart failure with EF 40 to 45% by echo done in February 2019 and follows with Dr. Ubaldo Glassing comes in because of worsening shortness of breath, hypoxia  *Acute hypoxic respiratory failure Secondary primarily to pneumonia, pulmonary fibrosis and CHF. Unable to wean off oxygen, he need home oxygen at this time, NEB prn.  *Acute CAP Continue pneumonia protocol, empiric Rocephin/azithromycin, blood cultures negative so far.  *Acute on chronic systolic congestive heart failure exacerbation BNP is normal, chest x-ray noted for pneumonia Increase Lasix to p.o 20 mg every 8 hours if blood pressure allows, resume low-dose Coreg, hold lisinopril due to soft blood pressure, strict I&O monitoring, daily weights.  *History of left carotid artery stenosis  Stable status post surgery by Dr. Delana Meyer  *history of CAD, CABG Stable continue aspirin, statins,  beta-blockers, hold lisinopril.  *idiopathic pulmonary  fibrosis Exacerbated by pneumonia Did receive IV Solu-Medrol in the emergency room Pulmicort twice daily, breathing treatments, empiric azithromycin. Continue home medication.  *Chronic BPH Continue Flomax  *Hyperglycemia with history diabetes mellitus type 2 Exacerbated by steroid use Continue high-dose sliding scale insulin, resume home NovoLog 70/30. Hold metformin, glipizide and Victoza.  Morbid obesity.  Diet control and follow-up with PCP.  All the records are reviewed and case discussed with Care Management/Social Workerr. Management plans discussed with the patient, his wife and the son and they are in agreement.  CODE STATUS: dnr  TOTAL TIME TAKING CARE OF THIS PATIENT: 28 minutes.     POSSIBLE D/C IN 1-2 DAYS, DEPENDING ON CLINICAL CONDITION.   Demetrios Loll M.D on 07/23/2018   Between 7am to 6pm - Pager - [phone]  After 6pm go to [email or web address] - password [email or web address] [facility]  Office  [phone]  CC: Primary care physician; Tracie Harrier, MD  Note: This dictation was prepared with Dragon dictation along with smaller phrase technology. Any transcriptional errors that result from this process are unintentional.

## 2018-07-23 NOTE — Progress Notes (Signed)
Anticoagulation monitoring(Lovenox):  57 yo  male ordered Lovenox 40 mg Q24h  Filed Weights   07/21/18 0540 07/22/18 0324 07/23/18 0523  Weight: 261 lb 12.8 oz (118.8 kg) 264 lb 9.6 oz (120 kg) 266 lb 4.8 oz (120.8 kg)   Body mass index is 41.71 kg/m.   Lab Results  Component Value Date   CREATININE 0.96 07/23/2018   CREATININE 1.09 07/21/2018   CREATININE 1.09 07/20/2018   Estimated Creatinine Clearance: 103.1 mL/min (by C-G formula based on SCr of 0.96 mg/dL). Hemoglobin & Hematocrit     Component Value Date/Time   HGB 11.9 (L) 07/21/2018 0331   HGB 12.7 (L) 10/22/2013 0507   HCT 39.0 07/21/2018 0331   HCT 38.8 (L) 10/22/2013 0507     Per Protocol for Patient with estCrcl > 30 ml/min and BMI > 40, will transition to Lovenox 40 mg Q12h.

## 2018-07-23 NOTE — Progress Notes (Addendum)
Cardiovascular and Pulmonary Nurse Navigator Note:    57 year old male patient with history of DM Type II, COPD, idiopathic pulmonary fibrosis, morbid obesity, chronic systolic heart failure with EF of 40 to 45% (last echo done in February 2019), CAD with hx of CABG,  who presented with SOB, hypoxia and pedal edema.  Patient admitted with acute respiratory failure secondary to acute on chronic systolic heart failure and right sided pneumonia.    Dr. Ubaldo Glassing - Patient's primary cardiologist, Dr Raul Del - Pulmonologist, Dr. Ginette Pitman - PCP.    CHF Education:?? Educational session with patient, wife, daughter and son completed. Note:  CHF is not a new diagnosis for patient.  Patient stated he has had CHF for about 5 years.   Provided patient with "Living Better with Heart Failure" packet. Briefly reviewed definition of heart failure and signs and symptoms of an exacerbation.?Explained to patient / family that HF is a chronic illness which requires self-assessment / self-management along with help from the cardiologist/PCP.?? ? *Reviewed importance of and reason behind checking weight daily in the AM, after using the bathroom, but before getting dressed. Patient has scales, but does not weigh himself daily.   ? *Reviewed with patient the following information: *Discussed when to call the Dr= weight gain of >2-3lb overnight of 5lb in a week,  *Discussed yellow zone= call MD: weight gain of >2-3lb overnight of 5lb in a week, increased swelling, increased SOB when lying down, chest discomfort, dizziness, increased fatigue *Red Zone= call 911: struggle to breath, fainting or near fainting, significant chest pain   *Diet - Reviewed low sodium diet-provided handout of recommended and not recommended foods.  Patient reported that he does not use table salt, but instead uses pink Himalayan salt.  The RN stressed to patient and family that salt is salt. Patient acknowledged understanding.  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 Dietitian Consultation for low sodium diet education ordered.  We talked about how patient has an opportunity to improve in this area.  This RN stressed the importance of reading food labels to keep the daily intake of sodium at 2000 mg or less.   ? *Discussed fluid intake with patient as well. Patient not currently on a fluid restriction, but advised no more than 8-8 ounce glasses of fluid per day.? ? *Instructed patient to take medications as prescribed for heart failure. Explained briefly why pt is on the medications (either make you feel better, live longer or keep you out of the hospital) and discussed monitoring and side effects.  ? *Discussed exercise.  Patient stated he is very active.  Discussed Pulmonary Rehab ([facility]).  Patient informed this RN that Pulmonary Rehab had been mentioned to him in the past, but he does not have time to participate in the program.  Patient stated he is very busy.  Patient's wife stated he never sits in the recliner.  He is always on the go.  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  Patient stated he just does not have time.  In addition, patient stated his doctors want him to continue being active for as long as possible.  ? *Smoking Cessation- Patient is a former smoker.   ? *[facility] - Explained the purpose of the HF Clinic. ?Explained to patient / family the HF Clinic does not replace PCP  nor Cardiologist, but is an additional resource to helping patient manage heart failure at home. Patient has a new patient appointment scheduled in the [facility] on 07/27/2018 at 1:20 p.m.  Location of clinic reviewed with patient and family.    Again, the 5 Steps to Living Better with Heart Failure were reviewed with patient / family.  ? Patient / family thanked me for providing the above information.  ? ? Roanna Epley, RN, BSN, [facility]? [facility]  Cardiovascular &?Pulmonary Nurse Navigator  Direct Line: [phone]  Department Phone #: [phone] Fax: [phone]? Email Address: [email or web address]

## 2018-07-24 LAB — GLUCOSE, CAPILLARY: GLUCOSE-CAPILLARY: 157 mg/dL — AB (ref 70–99)

## 2018-07-24 NOTE — Progress Notes (Signed)
Ivs and tele removed from patient. Discharge instructions given to patient, verbalized understanding. No acute distress at this time. Daughter is at bedside and will transport patient home.

## 2018-07-24 NOTE — Discharge Summary (Signed)
[facility] NAME: [name]    MR#:  [id number]  DATE OF BIRTH:  1960-12-01  DATE OF ADMISSION:  07/20/2018   ADMITTING PHYSICIAN: Epifanio Lesches, MD  DATE OF DISCHARGE: 07/24/2018 11:45 AM  PRIMARY CARE PHYSICIAN: Tracie Harrier, MD   ADMISSION DIAGNOSIS:  Acute respiratory failure with hypoxia ([place]) [J96.01] Pneumonia of right lower lobe due to infectious organism ([place]) [J18.1] DISCHARGE DIAGNOSIS:  Active Problems:   Acute respiratory failure with hypoxemia ([place])  SECONDARY DIAGNOSIS:   Past Medical History:  Diagnosis Date  . Cancer ([place])    skin (scalp)  . CHF (congestive heart failure) ([place])   . COPD (chronic obstructive pulmonary disease) ([place])   . Coronary artery disease   . Diabetes mellitus without complication ([place])   . MI (myocardial infarction) [facility])    HOSPITAL COURSE:  57 year old male patient with history of diabetes mellitus type 2, COPD,, morbid obesity, chronic systolic heart failure with EF 40 to 45% by echo done in February 2019 and follows with Dr. Ubaldo Glassing comes in because of worsening shortness of breath, hypoxia  *Acute hypoxic respiratory failure Secondary primarily to pneumonia, pulmonary fibrosis and CHF. Unable to wean off oxygen, he need home oxygen at this time, NEB prn.  *Acute CAP He was treated with Rocephin/azithromycin, blood cultures negative so far.  No fever or chills, no leukocytosis.  *Acute on chronic systolic congestive heart failure exacerbation BNP is normal, chest x-ray noted for pneumonia Increased Lasix to p.o 20 mg every 8 hours if blood pressure allows, resumed low-dose Coreg, hold lisinopril due to soft blood pressure, strict I&O monitoring, daily weights. Resume home dose of Lasix and Coreg.  Still hold lisinopril.  Follow-up Dr. Linda Hedges to adjust medication dose.  *History of left carotid artery stenosis  Stable status post surgery by Dr.  Delana Meyer  *history of CAD, CABG Stable continue aspirin, statins, beta-blockers, hold lisinopril.  *idiopathic pulmonary fibrosis Exacerbated by pneumonia Did receive IV Solu-Medrol in the emergency room Pulmicort twice daily, breathing treatments, empiric azithromycin. Continue home medication.  *ChronicBPH Continue Flomax  *Hyperglycemia with history diabetes mellitus type 2 Exacerbated by steroid use Better controlled with high-dose sliding scale insulin and home NovoLog 70/30.  Resume metformin, glipizide and Victoza after discharge.  Morbid obesity.  Diet control and follow-up with PCP. DISCHARGE CONDITIONS:  Stable, discharged to home today. CONSULTS OBTAINED:  Treatment Team:  Yolonda Kida, MD Erby Pian, MD Allyne Gee, MD DRUG ALLERGIES:  No Known Allergies DISCHARGE MEDICATIONS:   Allergies as of 07/24/2018   No Known Allergies     Medication List    STOP taking these medications   lisinopril 2.5 MG tablet Commonly known as:  PRINIVIL,ZESTRIL   oxyCODONE 5 MG immediate release tablet Commonly known as:  Oxy IR/ROXICODONE     TAKE these medications   acetaminophen 500 MG tablet Commonly known as:  TYLENOL Take 1,000-1,500 mg by mouth every 4 (four) hours as needed for moderate pain or headache.   albuterol 108 (90 Base) MCG/ACT inhaler Commonly known as:  PROVENTIL HFA;VENTOLIN HFA Inhale 2 puffs into the lungs every 6 (six) hours as needed for wheezing or shortness of breath.   allopurinol 100 MG tablet Commonly known as:  ZYLOPRIM Take 100 mg by mouth daily.   aspirin EC 81 MG tablet Take 81 mg by mouth daily.   carvedilol 6.25 MG tablet Commonly known as:  COREG Take 6.25 mg by mouth  2 (two) times daily with a meal.   cyanocobalamin 1000 MCG tablet Take 1,000 mcg by mouth daily.   DULoxetine 30 MG capsule Commonly known as:  CYMBALTA Take 30 mg by mouth every evening.   ESBRIET 267 MG Caps Generic drug:   Pirfenidone Take 801 mg by mouth 2 (two) times daily.   furosemide 40 MG tablet Commonly known as:  LASIX Take 40 mg by mouth 2 (two) times daily.   glipiZIDE 10 MG 24 hr tablet Commonly known as:  GLUCOTROL XL Take 10 mg by mouth 2 (two) times daily.   HYDROcodone-acetaminophen 7.5-325 MG tablet Commonly known as:  NORCO Take 1 tablet by mouth at bedtime.   insulin NPH-regular Human (70-30) 100 UNIT/ML injection Inject 30 Units into the skin 2 (two) times daily with a meal.   magnesium gluconate 500 MG tablet Commonly known as:  MAGONATE Take 500 mg by mouth daily.   metFORMIN 1000 MG tablet Commonly known as:  GLUCOPHAGE Take 1,000 mg by mouth 2 (two) times daily with a meal.   pravastatin 40 MG tablet Commonly known as:  PRAVACHOL Take 40 mg by mouth every evening.   rOPINIRole 1 MG tablet Commonly known as:  REQUIP Take 1 mg by mouth at bedtime.   tamsulosin 0.4 MG Caps capsule Commonly known as:  FLOMAX Take 0.4 mg by mouth every evening.   tiotropium 18 MCG inhalation capsule Commonly known as:  SPIRIVA Place 18 mcg into inhaler and inhale daily.   VICTOZA 18 MG/3ML Sopn Generic drug:  liraglutide Inject 0.6 mg into the skin daily.            Durable Medical Equipment  (From admission, onward)         Start     Ordered   07/23/18 1508  For home use only DME oxygen  Once    Question Answer Comment  Mode or (Route) Nasal cannula   Liters per Minute 2   Frequency Continuous (stationary and portable oxygen unit needed)   Oxygen conserving device Yes   Oxygen delivery system Gas      07/23/18 1507           DISCHARGE INSTRUCTIONS:  See AVS.  If you experience worsening of your admission symptoms, develop shortness of breath, life threatening emergency, suicidal or homicidal thoughts you must seek medical attention immediately by calling 911 or calling your MD immediately  if symptoms less severe.  You Must read complete  instructions/literature along with all the possible adverse reactions/side effects for all the Medicines you take and that have been prescribed to you. Take any new Medicines after you have completely understood and accpet all the possible adverse reactions/side effects.   Please note  You were cared for by a hospitalist during your hospital stay. If you have any questions about your discharge medications or the care you received while you were in the hospital after you are discharged, you can call the unit and asked to speak with the hospitalist on call if the hospitalist that took care of you is not available. Once you are discharged, your primary care physician will handle any further medical issues. Please note that NO REFILLS for any discharge medications will be authorized once you are discharged, as it is imperative that you return to your primary care physician (or establish a relationship with a primary care physician if you do not have one) for your aftercare needs so that they can reassess your need for medications and  monitor your lab values.    On the day of Discharge:  VITAL SIGNS:  Blood pressure (!) 100/56, pulse 99, temperature 97.7 F (36.5 C), temperature source Oral, resp. rate (!) 22, height 5\' 7"  (1.702 m), weight 120.5 kg, SpO2 96 %. PHYSICAL EXAMINATION:  GENERAL:  57 y.o.-year-old patient lying in the bed with no acute distress.  Morbid obesity. EYES: Pupils equal, round, reactive to light and accommodation. No scleral icterus. Extraocular muscles intact.  HEENT: Head atraumatic, normocephalic. Oropharynx and nasopharynx clear.  NECK:  Supple, no jugular venous distention. No thyroid enlargement, no tenderness.  LUNGS: Normal breath sounds bilaterally, no wheezing, rales,rhonchi or crepitation. No use of accessory muscles of respiration.  CARDIOVASCULAR: S1, S2 normal. No murmurs, rubs, or gallops.  ABDOMEN: Soft, non-tender, non-distended. Bowel sounds present. No  organomegaly or mass.  EXTREMITIES: No cyanosis, or clubbing.  Bilateral leg edema (better). NEUROLOGIC: Cranial nerves II through XII are intact. Muscle strength 5/5 in all extremities. Sensation intact. Gait not checked.  PSYCHIATRIC: The patient is alert and oriented x 3.  SKIN: No obvious rash, lesion, or ulcer.  DATA REVIEW:   CBC Recent Labs  Lab 07/21/18 0331  WBC 7.6  HGB 11.9*  HCT 39.0  PLT 160    Chemistries  Recent Labs  Lab 07/23/18 0823  NA 137  K 4.2  CL 102  CO2 27  GLUCOSE 174*  BUN 26*  CREATININE 0.96  CALCIUM 8.5*  MG 2.0     Microbiology Results  Results for orders placed or performed during the hospital encounter of 07/20/18  Blood culture (routine x 2)     Status: None (Preliminary result)   Collection Time: 07/20/18  6:53 PM  Result Value Ref Range Status   Specimen Description BLOOD RIGHT ANTECUBITAL  Final   Special Requests   Final    BOTTLES DRAWN AEROBIC AND ANAEROBIC Blood Culture results may not be optimal due to an excessive volume of blood received in culture bottles   Culture   Final    NO GROWTH 4 DAYS Performed at [facility], [address] [place], [place] [address]    Report Status PENDING  Incomplete  Blood culture (routine x 2)     Status: None (Preliminary result)   Collection Time: 07/20/18  6:53 PM  Result Value Ref Range Status   Specimen Description BLOOD BLOOD LEFT FOREARM  Final   Special Requests   Final    BOTTLES DRAWN AEROBIC AND ANAEROBIC Blood Culture adequate volume   Culture   Final    NO GROWTH 4 DAYS Performed at [facility], [address], [place], [place] [address]    Report Status PENDING  Incomplete    RADIOLOGY:  No results found.   Management plans discussed with the patient, his daughter and they are in agreement.  CODE STATUS: DNR   TOTAL TIME TAKING CARE OF THIS PATIENT: 33 minutes.    Demetrios Loll M.D on 07/24/2018 at 2:23 PM  Between 7am to 6pm - Pager -  [phone]  After 6pm go to [email or web address] - Proofreader  [facility]  Office  [phone]  CC: Primary care physician; Tracie Harrier, MD   Note: This dictation was prepared with Dragon dictation along with smaller phrase technology. Any transcriptional errors that result from this process are unintentional.

## 2018-07-25 LAB — CULTURE, BLOOD (ROUTINE X 2)
CULTURE: NO GROWTH
Culture: NO GROWTH
Special Requests: ADEQUATE

## 2018-07-26 ENCOUNTER — Encounter: Payer: Self-pay | Admitting: *Deleted

## 2018-07-26 ENCOUNTER — Other Ambulatory Visit: Payer: Self-pay | Admitting: *Deleted

## 2018-07-26 DIAGNOSIS — R918 Other nonspecific abnormal finding of lung field: Secondary | ICD-10-CM | POA: Diagnosis not present

## 2018-07-26 DIAGNOSIS — Z794 Long term (current) use of insulin: Secondary | ICD-10-CM | POA: Diagnosis not present

## 2018-07-26 DIAGNOSIS — I5022 Chronic systolic (congestive) heart failure: Secondary | ICD-10-CM | POA: Diagnosis not present

## 2018-07-26 DIAGNOSIS — I2510 Atherosclerotic heart disease of native coronary artery without angina pectoris: Secondary | ICD-10-CM | POA: Diagnosis not present

## 2018-07-26 DIAGNOSIS — I6522 Occlusion and stenosis of left carotid artery: Secondary | ICD-10-CM | POA: Diagnosis not present

## 2018-07-26 DIAGNOSIS — E119 Type 2 diabetes mellitus without complications: Secondary | ICD-10-CM | POA: Diagnosis not present

## 2018-07-26 DIAGNOSIS — J449 Chronic obstructive pulmonary disease, unspecified: Secondary | ICD-10-CM | POA: Diagnosis not present

## 2018-07-26 DIAGNOSIS — E785 Hyperlipidemia, unspecified: Secondary | ICD-10-CM | POA: Diagnosis not present

## 2018-07-26 DIAGNOSIS — Z951 Presence of aortocoronary bypass graft: Secondary | ICD-10-CM | POA: Diagnosis not present

## 2018-07-26 DIAGNOSIS — Z6841 Body Mass Index (BMI) 40.0 and over, adult: Secondary | ICD-10-CM | POA: Diagnosis not present

## 2018-07-26 DIAGNOSIS — R69 Illness, unspecified: Secondary | ICD-10-CM | POA: Diagnosis not present

## 2018-07-26 DIAGNOSIS — J90 Pleural effusion, not elsewhere classified: Secondary | ICD-10-CM | POA: Diagnosis not present

## 2018-07-26 NOTE — Patient Outreach (Signed)
[facility]) [place] Telephone Outreach PCP office completes Transition of Care follow up post-hospital discharge Post-hospital discharge day # 2  07/26/2018  [name] [birth year] [id number]  1:15 pm: Unsuccessful telephone outreach attempt to preferred phone number listed for [name], 58 y/o male referred to [facility] by inpatient RN CM after recent hospitalization December 27-31, 2019 for shortness of breath, acute respiratory failure with hypoxia secondary to (R) LL pneumonia/ CHF exacerbation.  Patient was discharged home to self-care after refusing home health services.  Patient has history including, but not limited to, PAD; DM- type II; HLD; (L) carotid stenosis; COPD; morbid obesity; sCHF with EF 40-45% in February 2019; BPH; and pulmonary fibrosis.  When I identified myself, person answering phone number listed as patient's preferred phone number states "you are looking to talk with [name]," and he independently provides another number for me to call:  [phone].  1:20 pm: Attempted second call to number as provided, which appears to be patient's spouse's phone number; when I identified myself to male person answering phone, she states that she is currently driving patient to attend provider appointments which are scheduled for this afternoon; person speaking on phone stated that she is patient's wife, and that they are unable to talk this afternoon due to attending provider appointments; this person requested that I re-attempt contact with patient again next week, and she provides this phone number as the preferred number for patient.  Briefly discussed purpose of today's call with patient's spouse, who initially thinks I am the heart failure clinic nurse.  Plan:  Will re-attempt contact/ outreach to patient as requested next week  Oneta Rack, RN, BSN, [place] Care  Management  [phone]

## 2018-07-27 ENCOUNTER — Ambulatory Visit: Payer: Medicare HMO | Admitting: Family

## 2018-07-28 DIAGNOSIS — J9601 Acute respiratory failure with hypoxia: Secondary | ICD-10-CM | POA: Diagnosis not present

## 2018-07-28 DIAGNOSIS — J449 Chronic obstructive pulmonary disease, unspecified: Secondary | ICD-10-CM | POA: Diagnosis not present

## 2018-07-30 DIAGNOSIS — J449 Chronic obstructive pulmonary disease, unspecified: Secondary | ICD-10-CM | POA: Diagnosis not present

## 2018-07-30 DIAGNOSIS — I2510 Atherosclerotic heart disease of native coronary artery without angina pectoris: Secondary | ICD-10-CM | POA: Diagnosis not present

## 2018-07-30 DIAGNOSIS — I429 Cardiomyopathy, unspecified: Secondary | ICD-10-CM | POA: Diagnosis not present

## 2018-07-30 DIAGNOSIS — E1142 Type 2 diabetes mellitus with diabetic polyneuropathy: Secondary | ICD-10-CM | POA: Diagnosis not present

## 2018-07-30 DIAGNOSIS — Z23 Encounter for immunization: Secondary | ICD-10-CM | POA: Diagnosis not present

## 2018-07-30 DIAGNOSIS — Z1389 Encounter for screening for other disorder: Secondary | ICD-10-CM | POA: Diagnosis not present

## 2018-07-30 DIAGNOSIS — J181 Lobar pneumonia, unspecified organism: Secondary | ICD-10-CM | POA: Diagnosis not present

## 2018-07-31 ENCOUNTER — Encounter: Payer: Self-pay | Admitting: *Deleted

## 2018-07-31 ENCOUNTER — Other Ambulatory Visit: Payer: Self-pay | Admitting: *Deleted

## 2018-07-31 NOTE — Patient Outreach (Signed)
[name]) [place] Telephone Outreach PCP office completes Transition of Care follow up post-hospital discharge Post-hospital discharge day # 7 Unsuccessful telephone outreach attempt # 2  07/31/2018  [name] Jun 14, 1961 [id number]  12:00 pm: Unsuccessful telephone outreach attempt to preferred phone number listed for [name], 58 y/o male referred to [facility] by inpatient RN CM after recent hospitalization December 27-31, 2019 for shortness of breath, acute respiratory failure with hypoxia secondary to (R) LL pneumonia/ CHF exacerbation.  Patient was discharged home to self-care after refusing home health services.  Patient has history including, but not limited to, PAD; DM- type II; HLD; (L) carotid stenosis; COPD; morbid obesity; sCHF with EF 40-45% in February 2019; BPH; and pulmonary fibrosis.  When I identified myself, male person answering phone number previously provided as patient's preferred phone number [phone]), states "I am at work and I can not talk to you today," and she does not provide HIPAA identifiers for patient, stating she is "busy at work."  Without sharing HIPAA identifiers with this person, I requested number to contact patient directly, but person I am speaking with, who identifies herself as patient's wife states, "he won't answer the phone if he doesn't recognize the number, I have your number and I will have him call you tomorrow."  I attempted to briefly discuss [place] CM services/ role with person answering phone, and asked that she please ask patient to leave me a voice message if he gets my voice mail if/ when he attempts to call-- I was able to share this information, but unfortunately, the person I am speaking with is obviously irritated with my call, and she abruptly stated, "I told you I would give him your number... you don't need to keep talking or to call back... if he wants to call  you, he will," and she abruptly hung the phone up without allowing further response.  Plan:  Will place [place] CM unsuccessful patient outreach letter in mail requesting call back in writing  Will await patient's call back, and proceed to case closure if I do not hear from patient within 10 business days of my original call to him on July 26, 2018, since person answering his preferred phone number has asked me not to call again  Oneta Rack, RN, BSN, [place] Coordinator [facility] Care Management  [phone]

## 2018-08-08 ENCOUNTER — Other Ambulatory Visit: Payer: Self-pay | Admitting: *Deleted

## 2018-08-08 DIAGNOSIS — I5022 Chronic systolic (congestive) heart failure: Secondary | ICD-10-CM | POA: Diagnosis not present

## 2018-08-08 DIAGNOSIS — I6522 Occlusion and stenosis of left carotid artery: Secondary | ICD-10-CM | POA: Diagnosis not present

## 2018-08-08 DIAGNOSIS — I2510 Atherosclerotic heart disease of native coronary artery without angina pectoris: Secondary | ICD-10-CM | POA: Diagnosis not present

## 2018-08-08 DIAGNOSIS — I739 Peripheral vascular disease, unspecified: Secondary | ICD-10-CM | POA: Diagnosis not present

## 2018-08-08 DIAGNOSIS — J432 Centrilobular emphysema: Secondary | ICD-10-CM | POA: Diagnosis not present

## 2018-08-08 DIAGNOSIS — E119 Type 2 diabetes mellitus without complications: Secondary | ICD-10-CM | POA: Diagnosis not present

## 2018-08-08 DIAGNOSIS — Z794 Long term (current) use of insulin: Secondary | ICD-10-CM | POA: Diagnosis not present

## 2018-08-08 DIAGNOSIS — I214 Non-ST elevation (NSTEMI) myocardial infarction: Secondary | ICD-10-CM | POA: Diagnosis not present

## 2018-08-08 NOTE — Patient Outreach (Signed)
[place] [facility]) Care Management [facility] CM Case Closure- unable to establish contact  08/08/2018  [name] 27-Jul-1960 [id number]  [facility] CM Case Closure note re:  [name], 58 y/o male referred to [place] by inpatient RN CM after recent hospitalization December 27-31, 2019 for shortness of breath, acute respiratory failure with hypoxia secondary to (R) LL pneumonia/ CHF exacerbation. Patient was discharged home to self-care after refusing home health services. Patient has history including, but not limited to, PAD; DM- type II; HLD; (L) carotid stenosis; COPD; morbid obesity; sCHF with EF 40-45% in February 2019; BPH; and pulmonary fibrosis.  To date, I have received no call back from patient after attempting to contact him x 2 post-recent hospital discharge; at time of last outreach on 07/31/2018, male person identifying herself as patient's spouse requested that I place no further calls to phone numbers listed in EMR for patient; at that time, she stated that she would have patient contact me.    Verified that [place] unsuccessful patient outreach was placed in mail to patient on 07/31/2018; given request made by patient's spouse to not attempt to re-contact patient on listed number in EMR, as well as no subsequent response/ call back from patient within 10 business days of initial unsuccessful outreach on 07/26/2018, will move to case closure.  Plan:  Will make patient inactive with [place] program and make patient's PCP aware of same  Oneta Rack, RN, BSN, [place] Care Management  [phone]

## 2018-08-10 DIAGNOSIS — J90 Pleural effusion, not elsewhere classified: Secondary | ICD-10-CM | POA: Diagnosis not present

## 2018-08-10 DIAGNOSIS — J84112 Idiopathic pulmonary fibrosis: Secondary | ICD-10-CM | POA: Diagnosis not present

## 2018-08-10 DIAGNOSIS — J8489 Other specified interstitial pulmonary diseases: Secondary | ICD-10-CM | POA: Diagnosis not present

## 2018-08-10 DIAGNOSIS — R0602 Shortness of breath: Secondary | ICD-10-CM | POA: Diagnosis not present

## 2018-08-10 DIAGNOSIS — R0609 Other forms of dyspnea: Secondary | ICD-10-CM | POA: Diagnosis not present

## 2018-08-13 ENCOUNTER — Other Ambulatory Visit: Payer: Self-pay | Admitting: Specialist

## 2018-08-13 DIAGNOSIS — J849 Interstitial pulmonary disease, unspecified: Secondary | ICD-10-CM

## 2018-08-20 ENCOUNTER — Ambulatory Visit
Admission: RE | Admit: 2018-08-20 | Discharge: 2018-08-20 | Disposition: A | Discharge status: Home / Self Care | Payer: Medicare HMO | Source: Ambulatory Visit | Attending: Specialist | Admitting: Specialist

## 2018-08-20 DIAGNOSIS — J449 Chronic obstructive pulmonary disease, unspecified: Secondary | ICD-10-CM | POA: Diagnosis not present

## 2018-08-20 DIAGNOSIS — J849 Interstitial pulmonary disease, unspecified: Secondary | ICD-10-CM | POA: Diagnosis not present

## 2018-08-20 DIAGNOSIS — J84112 Idiopathic pulmonary fibrosis: Secondary | ICD-10-CM | POA: Diagnosis not present

## 2018-08-28 DIAGNOSIS — J449 Chronic obstructive pulmonary disease, unspecified: Secondary | ICD-10-CM | POA: Diagnosis not present

## 2018-08-28 DIAGNOSIS — J9601 Acute respiratory failure with hypoxia: Secondary | ICD-10-CM | POA: Diagnosis not present

## 2018-09-06 DIAGNOSIS — I5022 Chronic systolic (congestive) heart failure: Secondary | ICD-10-CM | POA: Diagnosis not present

## 2018-09-06 DIAGNOSIS — I6522 Occlusion and stenosis of left carotid artery: Secondary | ICD-10-CM | POA: Diagnosis not present

## 2018-09-06 DIAGNOSIS — I739 Peripheral vascular disease, unspecified: Secondary | ICD-10-CM | POA: Diagnosis not present

## 2018-09-06 DIAGNOSIS — I2510 Atherosclerotic heart disease of native coronary artery without angina pectoris: Secondary | ICD-10-CM | POA: Diagnosis not present

## 2018-09-26 DIAGNOSIS — J449 Chronic obstructive pulmonary disease, unspecified: Secondary | ICD-10-CM | POA: Diagnosis not present

## 2018-09-26 DIAGNOSIS — J9601 Acute respiratory failure with hypoxia: Secondary | ICD-10-CM | POA: Diagnosis not present

## 2018-10-27 DIAGNOSIS — J449 Chronic obstructive pulmonary disease, unspecified: Secondary | ICD-10-CM | POA: Diagnosis not present

## 2018-10-27 DIAGNOSIS — J9601 Acute respiratory failure with hypoxia: Secondary | ICD-10-CM | POA: Diagnosis not present

## 2018-10-30 DIAGNOSIS — I2510 Atherosclerotic heart disease of native coronary artery without angina pectoris: Secondary | ICD-10-CM | POA: Diagnosis not present

## 2018-10-30 DIAGNOSIS — I739 Peripheral vascular disease, unspecified: Secondary | ICD-10-CM | POA: Diagnosis not present

## 2018-10-30 DIAGNOSIS — E1142 Type 2 diabetes mellitus with diabetic polyneuropathy: Secondary | ICD-10-CM | POA: Diagnosis not present

## 2018-10-30 DIAGNOSIS — Z0000 Encounter for general adult medical examination without abnormal findings: Secondary | ICD-10-CM | POA: Diagnosis not present

## 2018-10-30 DIAGNOSIS — E538 Deficiency of other specified B group vitamins: Secondary | ICD-10-CM | POA: Diagnosis not present

## 2018-10-30 DIAGNOSIS — I429 Cardiomyopathy, unspecified: Secondary | ICD-10-CM | POA: Diagnosis not present

## 2018-10-30 DIAGNOSIS — D649 Anemia, unspecified: Secondary | ICD-10-CM | POA: Diagnosis not present

## 2018-10-30 DIAGNOSIS — J432 Centrilobular emphysema: Secondary | ICD-10-CM | POA: Diagnosis not present

## 2018-10-30 DIAGNOSIS — I6522 Occlusion and stenosis of left carotid artery: Secondary | ICD-10-CM | POA: Diagnosis not present

## 2018-11-08 ENCOUNTER — Encounter (INDEPENDENT_AMBULATORY_CARE_PROVIDER_SITE_OTHER): Payer: Medicare HMO

## 2018-11-08 ENCOUNTER — Ambulatory Visit (INDEPENDENT_AMBULATORY_CARE_PROVIDER_SITE_OTHER): Payer: Medicare HMO | Admitting: Vascular Surgery

## 2018-11-13 DIAGNOSIS — J432 Centrilobular emphysema: Secondary | ICD-10-CM | POA: Diagnosis not present

## 2018-11-13 DIAGNOSIS — E1142 Type 2 diabetes mellitus with diabetic polyneuropathy: Secondary | ICD-10-CM | POA: Diagnosis not present

## 2018-11-13 DIAGNOSIS — J84112 Idiopathic pulmonary fibrosis: Secondary | ICD-10-CM | POA: Diagnosis not present

## 2018-11-13 DIAGNOSIS — I6522 Occlusion and stenosis of left carotid artery: Secondary | ICD-10-CM | POA: Diagnosis not present

## 2018-11-13 DIAGNOSIS — I429 Cardiomyopathy, unspecified: Secondary | ICD-10-CM | POA: Diagnosis not present

## 2018-11-13 DIAGNOSIS — I2510 Atherosclerotic heart disease of native coronary artery without angina pectoris: Secondary | ICD-10-CM | POA: Diagnosis not present

## 2018-11-13 DIAGNOSIS — I739 Peripheral vascular disease, unspecified: Secondary | ICD-10-CM | POA: Diagnosis not present

## 2018-11-15 DIAGNOSIS — J849 Interstitial pulmonary disease, unspecified: Secondary | ICD-10-CM | POA: Diagnosis not present

## 2018-11-15 DIAGNOSIS — J439 Emphysema, unspecified: Secondary | ICD-10-CM | POA: Diagnosis not present

## 2018-11-15 DIAGNOSIS — J9601 Acute respiratory failure with hypoxia: Secondary | ICD-10-CM | POA: Diagnosis not present

## 2018-11-15 DIAGNOSIS — R0609 Other forms of dyspnea: Secondary | ICD-10-CM | POA: Diagnosis not present

## 2018-11-16 DIAGNOSIS — J449 Chronic obstructive pulmonary disease, unspecified: Secondary | ICD-10-CM | POA: Diagnosis not present

## 2018-11-19 DIAGNOSIS — J449 Chronic obstructive pulmonary disease, unspecified: Secondary | ICD-10-CM | POA: Diagnosis not present

## 2018-11-23 DIAGNOSIS — E1165 Type 2 diabetes mellitus with hyperglycemia: Secondary | ICD-10-CM | POA: Insufficient documentation

## 2018-11-23 DIAGNOSIS — J84112 Idiopathic pulmonary fibrosis: Secondary | ICD-10-CM | POA: Diagnosis not present

## 2018-11-23 DIAGNOSIS — Z951 Presence of aortocoronary bypass graft: Secondary | ICD-10-CM | POA: Diagnosis not present

## 2018-11-23 DIAGNOSIS — I5022 Chronic systolic (congestive) heart failure: Secondary | ICD-10-CM | POA: Diagnosis not present

## 2018-11-23 DIAGNOSIS — E1142 Type 2 diabetes mellitus with diabetic polyneuropathy: Secondary | ICD-10-CM | POA: Diagnosis not present

## 2018-11-23 DIAGNOSIS — G8929 Other chronic pain: Secondary | ICD-10-CM | POA: Diagnosis not present

## 2018-11-23 DIAGNOSIS — I6522 Occlusion and stenosis of left carotid artery: Secondary | ICD-10-CM | POA: Diagnosis not present

## 2018-11-23 DIAGNOSIS — I9789 Other postprocedural complications and disorders of the circulatory system, not elsewhere classified: Secondary | ICD-10-CM | POA: Diagnosis not present

## 2018-11-23 DIAGNOSIS — J439 Emphysema, unspecified: Secondary | ICD-10-CM | POA: Diagnosis not present

## 2018-11-26 DIAGNOSIS — J449 Chronic obstructive pulmonary disease, unspecified: Secondary | ICD-10-CM | POA: Diagnosis not present

## 2018-11-26 DIAGNOSIS — J9601 Acute respiratory failure with hypoxia: Secondary | ICD-10-CM | POA: Diagnosis not present

## 2018-12-03 DIAGNOSIS — I6522 Occlusion and stenosis of left carotid artery: Secondary | ICD-10-CM | POA: Diagnosis not present

## 2018-12-03 DIAGNOSIS — I2510 Atherosclerotic heart disease of native coronary artery without angina pectoris: Secondary | ICD-10-CM | POA: Diagnosis not present

## 2018-12-03 DIAGNOSIS — I9789 Other postprocedural complications and disorders of the circulatory system, not elsewhere classified: Secondary | ICD-10-CM | POA: Diagnosis not present

## 2018-12-03 DIAGNOSIS — R0602 Shortness of breath: Secondary | ICD-10-CM | POA: Diagnosis not present

## 2018-12-03 DIAGNOSIS — I739 Peripheral vascular disease, unspecified: Secondary | ICD-10-CM | POA: Diagnosis not present

## 2018-12-03 DIAGNOSIS — I214 Non-ST elevation (NSTEMI) myocardial infarction: Secondary | ICD-10-CM | POA: Diagnosis not present

## 2018-12-03 DIAGNOSIS — I4891 Unspecified atrial fibrillation: Secondary | ICD-10-CM | POA: Diagnosis not present

## 2018-12-03 DIAGNOSIS — I5022 Chronic systolic (congestive) heart failure: Secondary | ICD-10-CM | POA: Diagnosis not present

## 2018-12-07 DIAGNOSIS — R0602 Shortness of breath: Secondary | ICD-10-CM | POA: Diagnosis not present

## 2018-12-12 DIAGNOSIS — J449 Chronic obstructive pulmonary disease, unspecified: Secondary | ICD-10-CM | POA: Diagnosis not present

## 2018-12-13 ENCOUNTER — Ambulatory Visit: Admission: RE | Admit: 2018-12-13 | Payer: Medicare HMO | Source: Ambulatory Visit

## 2018-12-13 ENCOUNTER — Encounter (INDEPENDENT_AMBULATORY_CARE_PROVIDER_SITE_OTHER): Payer: Medicare HMO

## 2018-12-13 ENCOUNTER — Ambulatory Visit (INDEPENDENT_AMBULATORY_CARE_PROVIDER_SITE_OTHER): Payer: Medicare HMO | Admitting: Vascular Surgery

## 2018-12-13 DIAGNOSIS — I214 Non-ST elevation (NSTEMI) myocardial infarction: Secondary | ICD-10-CM | POA: Diagnosis not present

## 2018-12-13 DIAGNOSIS — I9789 Other postprocedural complications and disorders of the circulatory system, not elsewhere classified: Secondary | ICD-10-CM | POA: Diagnosis not present

## 2018-12-13 DIAGNOSIS — I5022 Chronic systolic (congestive) heart failure: Secondary | ICD-10-CM | POA: Diagnosis not present

## 2018-12-13 DIAGNOSIS — I519 Heart disease, unspecified: Secondary | ICD-10-CM | POA: Diagnosis not present

## 2018-12-13 DIAGNOSIS — I429 Cardiomyopathy, unspecified: Secondary | ICD-10-CM | POA: Diagnosis not present

## 2018-12-13 DIAGNOSIS — I2510 Atherosclerotic heart disease of native coronary artery without angina pectoris: Secondary | ICD-10-CM | POA: Diagnosis not present

## 2018-12-13 DIAGNOSIS — I739 Peripheral vascular disease, unspecified: Secondary | ICD-10-CM | POA: Diagnosis not present

## 2018-12-13 DIAGNOSIS — I4891 Unspecified atrial fibrillation: Secondary | ICD-10-CM | POA: Diagnosis not present

## 2018-12-13 DIAGNOSIS — I6522 Occlusion and stenosis of left carotid artery: Secondary | ICD-10-CM | POA: Diagnosis not present

## 2018-12-13 NOTE — Progress Notes (Signed)
Patient's Name: [name]  MRN: [id number]  Referring Provider: Tracie Harrier, MD  DOB: Apr 15, 1961  PCP: Tracie Harrier, MD  DOS: 12/19/2018  Note by: Gillis Santa, MD  Service setting: Ambulatory outpatient  Specialty: Interventional Pain Management  Location: [facility] Virtual Visit  Visit type: Initial Patient Evaluation  Patient type: New Patient   Pain Management Virtual Encounter Note - Virtual Visit via Brush (real-time audio visits between healthcare provider and patient).  Patient's Phone No.:  [phone] (home); [phone] (mobile); (Preferred) [phone] No e-mail address on record  [id number] [place] [id number]: [phone]: [phone]  Pre-screening note:  Our staff contacted [name] and offered him an "in person", "face-to-face" appointment versus a telephone encounter. He indicated preferring the telephone encounter, at this time.  Primary Reason(s) for Visit: Tele-Encounter for initial evaluation of one or more chronic problems (new to examiner) potentially causing chronic pain, and posing a threat to normal musculoskeletal function. (Level of risk: High) CC: No chief complaint on file.  I contacted John Sawyeron 12/19/2018 at 12:10 PM via video conference.      I clearly identified myself as BGillis Santa MD. I verified that I was speaking with the correct person using two identifiers (Name and date of birth: 605/06/1961.  Advanced Informed Consent I sought verbal advanced consent from John Sawyerfor virtual visit interactions. I informed [name] of possible security and privacy concerns, risks, and limitations associated with providing "not-in-person" medical evaluation and management services. I also informed [name] of the availability of "in-person" appointments. Finally, I informed him that there would be a charge for the virtual  visit and that he could be  personally, fully or partially, financially responsible for it. Mr. WCarchiexpressed understanding and agreed to proceed.   HPI  Mr. WDentremontis a 58y.o. year old, male patient, contacted today for an initial evaluation of his chronic pain. He has PAD (peripheral artery disease) ([place]; Diabetes mellitus ([place]; Hyperlipidemia; Carotid stenosis; Carotid stenosis, symptomatic w/o infarct, left; Acute respiratory failure with hypoxemia (HCC); CAD (coronary artery disease); CHF (congestive heart failure) ([place]; Chronic right shoulder pain; COPD (chronic obstructive pulmonary disease) ([place]; Obesity; S/P CABG (coronary artery bypass graft); DM (diabetes mellitus) ([place]; Type 2 diabetes mellitus with hyperglycemia, with long-term current use of insulin (HCC); and Type 2 diabetes mellitus with peripheral neuropathy (HCC) on their problem list.  Pain Assessment: Location: Right, Left, Upper, Mid Other (Comment)(pain is generalized) Radiating: pain radiaities all over his body Onset: More than a month ago Duration: Chronic pain Quality: Aching, Burning, Nagging, Tingling, Throbbing, Pressure Severity: 5 /10 (subjective, self-reported pain score)  Effect on ADL: limits my daily activities, unable to sleep at night due to pain Timing: Constant Modifying factors: pain medications  Onset and Duration: Gradual and Date of onset: for years and years Cause of pain: Unknown Severity: Getting worse, NAS-11 at its worse: 10/10, NAS-11 at its best: 5/10, NAS-11 now: 5/10 and NAS-11 on the average: 5/10 Timing: Night and , pain gets really bad at night Aggravating Factors: Bending, Kneeling, Lifiting, Motion, Prolonged sitting, Prolonged standing, Squatting, Stooping  and Walking Alleviating Factors: Medications, Resting and Warm showers or baths Associated Problems: Depression, Fatigue, Tingling, Pain that wakes patient up and Pain that does not allow patient to sleep Quality of Pain:  Aching, Burning, Disabling, Getting longer, Horrible, Nagging, Pressure-like, Throbbing, Tingling, Tiring and  Uncomfortable Previous Examinations or Tests: CT scan, MRI scan and X-rays Previous Treatments: Narcotic medications  2015- heart surgery, triple bypass (CAD). 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 IV abx home. LV EF is now 25%.  Carotid endarterectomy LE vascular disease- ischemic pain. Has LE stents in place. Vascular pain. Taking Norco 7.5 mg qhs prn for help with sleep, Rx by Dr Ginette Pitman PCP changed to Tramadol which is less effective States that he usually only takes it at night. Denies any history of alcohol or substance abuse AVOID NSAIDS given CAD +CHF, LE swelling Has tried Cymbalta 30 mg in the evening, has tried Gabapentin not effective, has Avoid TCA given cardiac history   Historic Controlled Substance Pharmacotherapy Review   11/23/2018  2   11/23/2018  Tramadol Hcl 50 MG Tablet  90.00 30 Vi [place]   [id number]   [place] (4575)   0  15.00 MME  Medicare   [place]  10/22/2018  2   10/22/2018  Hydrocodone-Acetamin 7.5-325  30.00 30 Vi [place]   [id number]   [place] (4575)   0  7.50 MME  Medicare   [place]  09/25/2018  2   09/25/2018  Hydrocodone-Acetamin 7.5-325  30.00 7 Vi [place]   [id number]   [place] (4575)   0  32.14 MME  Medicare   [place]  08/29/2018  2   08/29/2018  Hydrocodone-Acetamin 7.5-325  30.00 7 Vi [place]   [id number]   [place] (4575)   0  32.14 MME  Medicare   [place]    Medications: Bottles not available for inspection. Pharmacodynamics: Desired effects: Analgesia: The patient reports >50% benefit. (with Hydrocodone at night) Reported improvement in function: The patient reports medication allows him to accomplish basic ADLs. Clinically meaningful improvement in function (CMIF): Sustained CMIF goals met Perceived effectiveness: Described as relatively effective, allowing for increase in activities of daily living (ADL) Undesirable effects: Side-effects or Adverse  reactions: None reported Historical Monitoring: The patient  reports no history of drug use. List of all UDS Test(s): No results found for: MDMA, COCAINSCRNUR, [place], [place], CANNABQUANT, [place], [place] List of other Serum/Urine Drug Screening Test(s):  No results found for: AMPHSCRSER, BARBSCRSER, BENZOSCRSER, COCAINSCRSER, COCAINSCRNUR, PCPSCRSER, PCPQUANT, THCSCRSER, THCU, CANNABQUANT, OPIATESCRSER, OXYSCRSER, PROPOXSCRSER, ETH Historical Background Evaluation: [place] PMP: PDMP not reviewed this encounter. Six (6) year initial data search conducted.              Department of public safety, offender search: Editor, commissioning Information) Non-contributory Risk Assessment Profile: Aberrant behavior: None observed or detected today Risk factors for fatal opioid overdose: None identified today Fatal overdose hazard ratio (HR): Calculation deferred Non-fatal overdose hazard ratio (HR): Calculation deferred Risk of opioid abuse or dependence: 0.7-3.0% with doses ? 36 MME/day and 6.1-26% with doses ? 120 MME/day. Substance use disorder (SUD) risk level: See below Personal History of Substance Abuse (SUD-Substance use disorder):  Alcohol: Negative  Illegal Drugs: Negative  Rx Drugs: Negative  ORT Risk Level calculation: Low Risk Opioid Risk Tool - 12/18/18 1320      Family History of Substance Abuse   Alcohol  Negative    Illegal Drugs  Negative    Rx Drugs  Negative      Personal History of Substance Abuse   Alcohol  Negative    Illegal Drugs  Negative    Rx Drugs  Negative      Age   Age between 75-45 years   No      History of Preadolescent  Sexual Abuse   History of Preadolescent Sexual Abuse  Negative or Male      Psychological Disease   Psychological Disease  Positive    ADD  Negative    OCD  Negative    Bipolar  Negative    Schizophrenia  Negative    Depression  Positive      Total Score   Opioid Risk Tool Scoring  3    Opioid Risk Interpretation  Low Risk      ORT Scoring  interpretation table:  Score <3 = Low Risk for SUD  Score between 4-7 = Moderate Risk for SUD  Score >8 = High Risk for Opioid Abuse   Pharmacologic Plan: As per protocol, I have not taken over any controlled substance management, pending the results of ordered tests and/or consults.            Initial impression: Pending review of available data and ordered tests.  Meds   Current Outpatient Medications:  .  acetaminophen (TYLENOL) 500 MG tablet, Take 1,000-1,500 mg by mouth every 4 (four) hours as needed for moderate pain or headache. , Disp: , Rfl:  .  albuterol (PROVENTIL HFA;VENTOLIN HFA) 108 (90 Base) MCG/ACT inhaler, Inhale 2 puffs into the lungs every 6 (six) hours as needed for wheezing or shortness of breath., Disp: , Rfl:  .  aspirin EC 81 MG tablet, Take 81 mg by mouth daily., Disp: , Rfl:  .  carvedilol (COREG) 6.25 MG tablet, Take 6.25 mg by mouth 2 (two) times daily with a meal., Disp: , Rfl:  .  furosemide (LASIX) 40 MG tablet, Take 40 mg by mouth 2 (two) times daily. , Disp: , Rfl:  .  glipiZIDE (GLUCOTROL XL) 10 MG 24 hr tablet, Take 10 mg by mouth 2 (two) times daily. , Disp: , Rfl:  .  insulin NPH-regular Human (NOVOLIN 70/30) (70-30) 100 UNIT/ML injection, Inject 30 Units into the skin 2 (two) times daily with a meal. , Disp: , Rfl:  .  metFORMIN (GLUCOPHAGE) 1000 MG tablet, Take 1,000 mg by mouth 2 (two) times daily with a meal., Disp: , Rfl:  .  Pirfenidone (ESBRIET) 267 MG CAPS, Take 801 mg by mouth 2 (two) times daily. , Disp: , Rfl:  .  pravastatin (PRAVACHOL) 40 MG tablet, Take 40 mg by mouth every evening. , Disp: , Rfl:  .  tamsulosin (FLOMAX) 0.4 MG CAPS capsule, Take 0.4 mg by mouth every evening. , Disp: , Rfl: 3 .  tiotropium (SPIRIVA) 18 MCG inhalation capsule, Place 18 mcg into inhaler and inhale daily., Disp: , Rfl:  .  VICTOZA 18 MG/3ML SOPN, Inject 0.6 mg into the skin daily. , Disp: , Rfl:  .  allopurinol (ZYLOPRIM) 100 MG tablet, Take 100 mg by mouth  daily., Disp: , Rfl: 5 .  cyanocobalamin 1000 MCG tablet, Take 1,000 mcg by mouth daily., Disp: , Rfl:  .  DULoxetine (CYMBALTA) 30 MG capsule, Take 30 mg by mouth every evening. , Disp: , Rfl: 1 .  HYDROcodone-acetaminophen (NORCO) 7.5-325 MG tablet, Take 1 tablet by mouth at bedtime. , Disp: , Rfl:  .  magnesium gluconate (MAGONATE) 500 MG tablet, Take 500 mg by mouth daily., Disp: , Rfl:  .  rOPINIRole (REQUIP) 1 MG tablet, Take 1 mg by mouth at bedtime. , Disp: , Rfl:   ROS  Cardiovascular: Heart trouble, Abnormal heart rhythm, Daily Aspirin intake, Heart attack ( Date: triple heart bypass), Heart surgery, Weak heart (CHF) and Heart  catheterization Pulmonary or Respiratory: Lung problems and Snoring he has COPD and Idiopathic Pulmonary Fibrosis,on 2 liters oxygen Neurological: No reported neurological signs or symptoms such as seizures, abnormal skin sensations, urinary and/or fecal incontinence, being born with an abnormal open spine and/or a tethered spinal cord Review of Past Neurological Studies: No results found for this or any previous visit. Psychological-Psychiatric: Depressed and Difficulty sleeping and or falling asleep Gastrointestinal: No reported gastrointestinal signs or symptoms such as vomiting or evacuating blood, reflux, heartburn, alternating episodes of diarrhea and constipation, inflamed or scarred liver, or pancreas or irrregular and/or infrequent bowel movements Genitourinary: No reported renal or genitourinary signs or symptoms such as difficulty voiding or producing urine, peeing blood, non-functioning kidney, kidney stones, difficulty emptying the bladder, difficulty controlling the flow of urine, or chronic kidney disease Hematological: No reported hematological signs or symptoms such as prolonged bleeding, low or poor functioning platelets, bruising or bleeding easily, hereditary bleeding problems, low energy levels due to low hemoglobin or being anemic Endocrine: High  blood sugar requiring insulin (IDDM) Rheumatologic: Generalized muscle aches (Fibromyalgia) Musculoskeletal: Negative for myasthenia gravis, muscular dystrophy, multiple sclerosis or malignant hyperthermia Work History: Disabled  Allergies  Mr. Gilpatrick has No Known Allergies.  Laboratory Chemistry  Inflammation Markers (CRP: Acute Phase) (ESR: Chronic Phase) Lab Results  Component Value Date   LATICACIDVEN 1.4 09/09/2017                         Rheumatology Markers No results found for: RF, ANA, LABURIC, URICUR, LYMEIGGIGMAB, LYMEABIGMQN, HLAB27                      Renal Function Markers Lab Results  Component Value Date   BUN 26 (H) 07/23/2018   CREATININE 0.96 07/23/2018   GFRAA >60 07/23/2018   GFRNONAA >60 07/23/2018                             Hepatic Function Markers Lab Results  Component Value Date   AST 21 08/17/2017   ALT 20 08/17/2017   ALBUMIN 3.7 08/17/2017   ALKPHOS 44 08/17/2017                        Electrolytes Lab Results  Component Value Date   NA 137 07/23/2018   K 4.2 07/23/2018   CL 102 07/23/2018   CALCIUM 8.5 (L) 07/23/2018   MG 2.0 07/23/2018   PHOS 4.4 09/10/2017                        Neuropathy Markers Lab Results  Component Value Date   HGBA1C 7.8 (H) 07/21/2018   HIV Non Reactive 07/21/2018                        CNS Tests No results found for: COLORCSF, APPEARCSF, RBCCOUNTCSF, WBCCSF, POLYSCSF, LYMPHSCSF, EOSCSF, PROTEINCSF, GLUCCSF, JCVIRUS, CSFOLI, IGGCSF, LABACHR, ACETBL                      Bone Pathology Markers No results found for: [place], VD125OH2TOT, [id number], JG2836OQ9, 25OHVITD1, 25OHVITD2, 25OHVITD3, TESTOFREE, TESTOSTERONE                       Coagulation Parameters Lab Results  Component Value Date   INR 1.07 09/09/2017   LABPROT 13.8  09/09/2017   APTT 28 09/08/2017   PLT 160 07/21/2018                        Cardiovascular Markers Lab Results  Component Value Date   BNP 158.0 (H) 07/20/2018    CKTOTAL 118 10/20/2013   CKMB 6.1 (H) 10/20/2013   TROPONINI 0.03 (HH) 07/20/2018   HGB 11.9 (L) 07/21/2018   HCT 39.0 07/21/2018                         ID Markers Lab Results  Component Value Date   HIV Non Reactive 07/21/2018                        CA Markers No results found for: CEA, CA125, LABCA2                      Endocrine Markers No results found for: TSH, FREET4, TESTOFREE, TESTOSTERONE, SHBG, ESTRADIOL, ESTRADIOLPCT, ESTRADIOLFRE, LABPREG, ACTH                      Note: Lab results reviewed.  Imaging Review  CT Chest IMPRESSION: 1. Pulmonary parenchymal pattern of fibrosis appears grossly stable from 11/30/2017 but progressive from 01/11/2017. Chronic hypersensitivity pneumonitis could have this appearance given associated air trapping. Fibrotic nonspecific interstitial pneumonitis is another consideration. Central interstitial prominence and ground-glass are features that are inconsistent with a diagnosis of usual interstitial pneumonitis. 2. Mediastinal adenopathy, stable and likely due to interstitial lung disease. 3. Cholelithiasis. 4.  Aortic atherosclerosis (ICD10-170.0).  Complexity Note: Imaging results reviewed. Results shared with Mr. Elsass, using Layman's terms.                         PFSH  Drug: Mr. Reh  reports no history of drug use. Alcohol:  reports current alcohol use. Tobacco:  reports that he has quit smoking. He has never used smokeless tobacco. Medical:  has a past medical history of Cancer ([place]), CHF (congestive heart failure) ([place]), COPD (chronic obstructive pulmonary disease) ([place]), Coronary artery disease, Diabetes mellitus without complication ([place]), and MI (myocardial infarction) ([place]). Family: family history includes Cancer in his father; Heart attack in his mother.  Past Surgical History:  Procedure Laterality Date  . APPLICATION OF WOUND VAC     chest wound s/p CABG  . CARDIAC CATHETERIZATION    . CORONARY ARTERY BYPASS  GRAFT  2015   3 vessels  . ENDARTERECTOMY Left 09/08/2017   Procedure: ENDARTERECTOMY CAROTID;  Surgeon: Katha Cabal, MD;  Location: [facility];  Service: Vascular;  Laterality: Left;  . PERIPHERAL VASCULAR CATHETERIZATION N/A 06/09/2015   Procedure: Abdominal Aortogram w/Lower Extremity;  Surgeon: Katha Cabal, MD;  Location: [place] [facility];  Service: Cardiovascular;  Laterality: N/A;  . PERIPHERAL VASCULAR CATHETERIZATION  06/09/2015   Procedure: Lower Extremity Intervention;  Surgeon: Katha Cabal, MD;  Location: [facility];  Service: Cardiovascular;;  . stent in leg Left   . WOUND DEBRIDEMENT     debridement of chest wound x 2 s/p CABG    Active Ambulatory Problems    Diagnosis Date Noted  . PAD (peripheral artery disease) ([place]) 08/05/2016  . Diabetes mellitus ([place]) 08/05/2016  . Hyperlipidemia 08/05/2016  . Carotid stenosis 07/10/2017  . Carotid stenosis, symptomatic w/o infarct, left 09/08/2017  . Acute respiratory failure  with hypoxemia ([place]) 07/20/2018  . CAD (coronary artery disease) 11/22/2013  . CHF (congestive heart failure) ([place]) 12/14/2013  . Chronic right shoulder pain 03/19/2018  . COPD (chronic obstructive pulmonary disease) ([place]) 12/14/2013  . Obesity 01/31/2014  . S/P CABG (coronary artery bypass graft) 12/10/2013  . DM (diabetes mellitus) ([place]) 03/19/2012  . Type 2 diabetes mellitus with hyperglycemia, with long-term current use of insulin ([place]) 11/23/2018  . Type 2 diabetes mellitus with peripheral neuropathy ([place]) 06/30/2015   Resolved Ambulatory Problems    Diagnosis Date Noted  . No Resolved Ambulatory Problems   Past Medical History:  Diagnosis Date  . Cancer ([place])   . Coronary artery disease   . Diabetes mellitus without complication ([place])   . MI (myocardial infarction) [facility])    Assessment  Primary Diagnosis & Pertinent Problem List: The primary encounter diagnosis was Chronic pain syndrome. Diagnoses of Coronary  artery disease involving other coronary artery bypass graft, angina presence unspecified, Carotid stenosis, symptomatic w/o infarct, left, Bilateral carotid artery stenosis, Congestive heart failure, unspecified HF chronicity, unspecified heart failure type ([place]), Type 2 diabetes mellitus with peripheral neuropathy ([place]), Chronic right shoulder pain, and S/P CABG (coronary artery bypass graft) were also pertinent to this visit.  Visit Diagnosis (New problems to examiner): 1. Chronic pain syndrome   2. Coronary artery disease involving other coronary artery bypass graft, angina presence unspecified   3. Carotid stenosis, symptomatic w/o infarct, left   4. Bilateral carotid artery stenosis   5. Congestive heart failure, unspecified HF chronicity, unspecified heart failure type ([place])   6. Type 2 diabetes mellitus with peripheral neuropathy (HCC)   7. Chronic right shoulder pain   8. S/P CABG (coronary artery bypass graft)    58 year old male with a history of coronary artery disease, carotid artery disease, peripheral vascular disease, CHF, type 2 diabetes with peripheral neuropathy who presents with chronic pain syndrome.  Of note patient had triple-vessel cardiac bypass in 2015 at [facility] which was complicated postoperatively by sternal staph infection that required removal of sternal wires, incision and drainage x2 with subsequent chronic wound VAC placement and IV home antibiotics for 8 weeks.  Patient states that he has chest pain as well as radiating shoulder pain.  Patient also has ischemic vascular disease that contributes to his diffuse whole body pain.  Patient has lower extremity stents in place.  Pain was previously being managed by PCP.  Patient states that he only takes hydrocodone at night and finds it beneficial.  He does not find benefit with tramadol which she was recently prescribed.  Patient has tried various membrane stabilizers including gabapentin, Lyrica, Cymbalta in the past without  any significant benefit so he stopped.  Patient also has depressed EF and COPD.  Would like to be cautious of sedative medications that could impact his respiratory drive and shift his CO2 curve.  Avoid NSAIDs given history of vascular disease and coronary artery disease.  Would like to avoid muscle relaxants given risk of sedation which is increased disproportionately alongside chronic opioid therapy.  Referral to psychiatry for risk evaluation, UDS today.  Instructed to patient that he should call our clinic after his psych evaluation and urine drug screen for second patient visit at which point we can review results and discuss chronic opioid therapy.  Plan of Care (Initial workup plan)  Note: Mr. Poland was reminded that as per protocol, today's visit has been an evaluation only. We have not taken over the patient's controlled substance management.  Lab Orders     Compliance Drug Analysis, Ur  Referral Orders     Ambulatory referral to Psychology  Pharmacological management options:  Opioid Analgesics: The patient was informed that there is no guarantee that he would be a candidate for opioid analgesics. The decision will be made following CDC guidelines. This decision will be based on the results of diagnostic studies, as well as Mr. Beining's risk profile. Consider Hydrocodone 7.5 mg qhs (#30-45/month)  Membrane stabilizer: Tried and failed gabapentin, Lyrica, Cymbalta.  Avoid TCA given cardiac history and risk of QTC prolongation  Muscle relaxant: Not indicated, avoid given sedative side effects  NSAID: Avoid in the context of his coronary artery disease and peripheral vascular disease  Other analgesic(s): To be determined at a later time   Provider-requested follow-up: Return for After Psychological evaluation.  Future Appointments  Date Time Provider [place]  01/03/2019  2:30 PM [facility] 2 [facility] None  01/03/2019  3:15 PM [facility] 2 [facility] None  01/03/2019  3:45 PM  Kris Hartmann, NP [facility] None    Total duration of non-face-to-face encounter: 93mnutes.  Primary Care Physician: HTracie Harrier MD [facility] Note by: BGillis Santa MD Date: 12/19/2018; Time: 12:10 PM  Note: This dictation was prepared with Dragon dictation. Any transcriptional errors that may result from this process are unintentional.

## 2018-12-18 ENCOUNTER — Encounter: Payer: Self-pay | Admitting: Student in an Organized Health Care Education/Training Program

## 2018-12-19 ENCOUNTER — Other Ambulatory Visit: Payer: Self-pay

## 2018-12-19 ENCOUNTER — Encounter: Payer: Self-pay | Admitting: Student in an Organized Health Care Education/Training Program

## 2018-12-19 ENCOUNTER — Ambulatory Visit
Payer: Medicare HMO | Attending: Student in an Organized Health Care Education/Training Program | Admitting: Student in an Organized Health Care Education/Training Program

## 2018-12-19 DIAGNOSIS — I25810 Atherosclerosis of coronary artery bypass graft(s) without angina pectoris: Secondary | ICD-10-CM | POA: Diagnosis not present

## 2018-12-19 DIAGNOSIS — I509 Heart failure, unspecified: Secondary | ICD-10-CM | POA: Diagnosis not present

## 2018-12-19 DIAGNOSIS — Z951 Presence of aortocoronary bypass graft: Secondary | ICD-10-CM | POA: Diagnosis not present

## 2018-12-19 DIAGNOSIS — M25511 Pain in right shoulder: Secondary | ICD-10-CM | POA: Diagnosis not present

## 2018-12-19 DIAGNOSIS — I6522 Occlusion and stenosis of left carotid artery: Secondary | ICD-10-CM | POA: Diagnosis not present

## 2018-12-19 DIAGNOSIS — G894 Chronic pain syndrome: Secondary | ICD-10-CM

## 2018-12-19 DIAGNOSIS — G8929 Other chronic pain: Secondary | ICD-10-CM

## 2018-12-19 DIAGNOSIS — I6523 Occlusion and stenosis of bilateral carotid arteries: Secondary | ICD-10-CM | POA: Diagnosis not present

## 2018-12-19 DIAGNOSIS — J449 Chronic obstructive pulmonary disease, unspecified: Secondary | ICD-10-CM | POA: Diagnosis not present

## 2018-12-19 DIAGNOSIS — E509 Vitamin A deficiency, unspecified: Secondary | ICD-10-CM | POA: Diagnosis not present

## 2018-12-19 DIAGNOSIS — E1142 Type 2 diabetes mellitus with diabetic polyneuropathy: Secondary | ICD-10-CM

## 2018-12-20 ENCOUNTER — Other Ambulatory Visit
Admission: RE | Admit: 2018-12-20 | Discharge: 2018-12-20 | Disposition: A | Discharge status: Home / Self Care | Payer: Medicare HMO | Source: Ambulatory Visit | Attending: Student in an Organized Health Care Education/Training Program | Admitting: Student in an Organized Health Care Education/Training Program

## 2018-12-20 DIAGNOSIS — J439 Emphysema, unspecified: Secondary | ICD-10-CM | POA: Diagnosis not present

## 2018-12-20 DIAGNOSIS — E785 Hyperlipidemia, unspecified: Secondary | ICD-10-CM | POA: Diagnosis not present

## 2018-12-20 DIAGNOSIS — I429 Cardiomyopathy, unspecified: Secondary | ICD-10-CM | POA: Diagnosis not present

## 2018-12-20 DIAGNOSIS — I2510 Atherosclerotic heart disease of native coronary artery without angina pectoris: Secondary | ICD-10-CM | POA: Diagnosis not present

## 2018-12-20 DIAGNOSIS — E1142 Type 2 diabetes mellitus with diabetic polyneuropathy: Secondary | ICD-10-CM | POA: Diagnosis not present

## 2018-12-20 DIAGNOSIS — Z951 Presence of aortocoronary bypass graft: Secondary | ICD-10-CM | POA: Diagnosis not present

## 2018-12-20 DIAGNOSIS — I5022 Chronic systolic (congestive) heart failure: Secondary | ICD-10-CM | POA: Diagnosis not present

## 2018-12-20 DIAGNOSIS — G894 Chronic pain syndrome: Secondary | ICD-10-CM | POA: Diagnosis not present

## 2018-12-20 DIAGNOSIS — I739 Peripheral vascular disease, unspecified: Secondary | ICD-10-CM | POA: Diagnosis not present

## 2018-12-20 LAB — URINE DRUG SCREEN, QUALITATIVE (ARMC ONLY)
Amphetamines, Ur Screen: NOT DETECTED
Barbiturates, Ur Screen: NOT DETECTED
Benzodiazepine, Ur Scrn: NOT DETECTED
Cannabinoid 50 Ng, Ur ~~LOC~~: NOT DETECTED
Cocaine Metabolite,Ur ~~LOC~~: NOT DETECTED
MDMA (Ecstasy)Ur Screen: NOT DETECTED
Methadone Scn, Ur: NOT DETECTED
Opiate, Ur Screen: NOT DETECTED
Phencyclidine (PCP) Ur S: NOT DETECTED
Tricyclic, Ur Screen: NOT DETECTED

## 2018-12-27 DIAGNOSIS — J9601 Acute respiratory failure with hypoxia: Secondary | ICD-10-CM | POA: Diagnosis not present

## 2018-12-27 DIAGNOSIS — J449 Chronic obstructive pulmonary disease, unspecified: Secondary | ICD-10-CM | POA: Diagnosis not present

## 2019-01-03 ENCOUNTER — Ambulatory Visit (INDEPENDENT_AMBULATORY_CARE_PROVIDER_SITE_OTHER): Payer: Medicare HMO | Admitting: Nurse Practitioner

## 2019-01-03 ENCOUNTER — Ambulatory Visit (INDEPENDENT_AMBULATORY_CARE_PROVIDER_SITE_OTHER): Payer: Medicare HMO

## 2019-01-03 ENCOUNTER — Other Ambulatory Visit: Payer: Self-pay

## 2019-01-03 ENCOUNTER — Encounter (INDEPENDENT_AMBULATORY_CARE_PROVIDER_SITE_OTHER): Payer: Self-pay | Admitting: Nurse Practitioner

## 2019-01-03 VITALS — BP 103/77 | HR 98 | Resp 16 | Wt 217.0 lb

## 2019-01-03 DIAGNOSIS — I6522 Occlusion and stenosis of left carotid artery: Secondary | ICD-10-CM | POA: Diagnosis not present

## 2019-01-03 DIAGNOSIS — Z87891 Personal history of nicotine dependence: Secondary | ICD-10-CM

## 2019-01-03 DIAGNOSIS — E1151 Type 2 diabetes mellitus with diabetic peripheral angiopathy without gangrene: Secondary | ICD-10-CM | POA: Diagnosis not present

## 2019-01-03 DIAGNOSIS — I739 Peripheral vascular disease, unspecified: Secondary | ICD-10-CM | POA: Diagnosis not present

## 2019-01-03 DIAGNOSIS — Z9889 Other specified postprocedural states: Secondary | ICD-10-CM

## 2019-01-03 DIAGNOSIS — I5022 Chronic systolic (congestive) heart failure: Secondary | ICD-10-CM | POA: Diagnosis not present

## 2019-01-03 DIAGNOSIS — I2510 Atherosclerotic heart disease of native coronary artery without angina pectoris: Secondary | ICD-10-CM | POA: Diagnosis not present

## 2019-01-03 DIAGNOSIS — Z79899 Other long term (current) drug therapy: Secondary | ICD-10-CM

## 2019-01-03 DIAGNOSIS — Z7982 Long term (current) use of aspirin: Secondary | ICD-10-CM | POA: Diagnosis not present

## 2019-01-03 DIAGNOSIS — I214 Non-ST elevation (NSTEMI) myocardial infarction: Secondary | ICD-10-CM | POA: Diagnosis not present

## 2019-01-03 DIAGNOSIS — I6523 Occlusion and stenosis of bilateral carotid arteries: Secondary | ICD-10-CM

## 2019-01-03 DIAGNOSIS — E785 Hyperlipidemia, unspecified: Secondary | ICD-10-CM | POA: Diagnosis not present

## 2019-01-03 DIAGNOSIS — E1159 Type 2 diabetes mellitus with other circulatory complications: Secondary | ICD-10-CM

## 2019-01-04 NOTE — Progress Notes (Signed)
SUBJECTIVE:  Patient ID: [name], male    DOB: 1960/12/28, 58 y.o.   MRN: [id number] Chief Complaint  Patient presents with   Follow-up    42month ultrasound follow up    HPI  [name] is a 58 y.o. male The patient is seen for follow up evaluation of carotid stenosis status post left carotid endarterectomy on 09/08/2017.  There were no post operative problems or complications related to the surgery.  The patient denies neck or incisional pain.  The patient denies interval amaurosis fugax. There is no recent history of TIA symptoms or focal motor deficits. There is no prior documented CVA.  The patient denies headache.  The patient is taking enteric-coated aspirin 81 mg daily.  The patient has a history of coronary artery disease, no recent episodes of angina or shortness of breath. There is a history of hyperlipidemia which is being treated with a statin.   The patient returns to the office for followup and review of the noninvasive studies.   The patient notes interval shortening of their claudication distance and development of mild rest pain symptoms.  However, the patient states that he has had this pain for years even prior to intervention back in 2014.  He states that the pain is mostly within his hips.  He has had multiple injections yet they have not helped his pain at all.  No new ulcers or wounds have occurred since the last visit.  There have been no significant changes to the patient's overall health care.  The patient denies amaurosis fugax or recent TIA symptoms. There are no recent neurological changes noted. The patient denies history of DVT, PE or superficial thrombophlebitis. The patient denies recent episodes of angina or shortness of breath.   ABI's Rt=1.03 and Lt=0.96 (previous ABI's Rt=0.74 and Lt=0.84) Duplex US of the lower extremity arterial system shows the patient has monophasic waveforms within his right tibial arteries his left has  monophasic within the anterior tibial artery and biphasic within the posterior tibial artery.  Velocities within the right internal carotid artery are consistent with a 40 to 59% stenosis.  The left carotid artery has no evidence of stenosis within the internal carotid artery.  Left subclavian artery flows disturbed with normal flow hemodynamics within the right subclavian artery.  This is consistent with previous studies on 05/03/2018.  Past Medical History:  Diagnosis Date   Cancer ([place])    skin (scalp)   CHF (congestive heart failure) (HCC)    COPD (chronic obstructive pulmonary disease) (HCC)    Coronary artery disease    Diabetes mellitus without complication (HCC)    MI (myocardial infarction) ([place])     Past Surgical History:  Procedure Laterality Date   APPLICATION OF WOUND VAC     chest wound s/p CABG   CARDIAC CATHETERIZATION     CORONARY ARTERY BYPASS GRAFT  2015   3 vessels   ENDARTERECTOMY Left 09/08/2017   Procedure: ENDARTERECTOMY CAROTID;  Surgeon: Katha Cabal, MD;  Location: [facility];  Service: Vascular;  Laterality: Left;   PERIPHERAL VASCULAR CATHETERIZATION N/A 06/09/2015   Procedure: Abdominal Aortogram w/Lower Extremity;  Surgeon: Katha Cabal, MD;  Location: [place] [facility];  Service: Cardiovascular;  Laterality: N/A;   PERIPHERAL VASCULAR CATHETERIZATION  06/09/2015   Procedure: Lower Extremity Intervention;  Surgeon: Katha Cabal, MD;  Location: [place] [facility];  Service: Cardiovascular;;   stent in leg Left    WOUND DEBRIDEMENT  debridement of chest wound x 2 s/p CABG     Social History   Socioeconomic History   Marital status: Married    Spouse name: Not on file   Number of children: Not on file   Years of education: Not on file   Highest education level: Not on file  Occupational History   Not on file  Social Needs   Financial resource strain: Not on file   Food insecurity    Worry: Not on file      Inability: Not on file   Transportation needs    Medical: Not on file    Non-medical: Not on file  Tobacco Use   Smoking status: Former Smoker   Smokeless tobacco: Never Used  Substance and Sexual Activity   Alcohol use: Yes    Comment: seldom   Drug use: No   Sexual activity: Not on file  Lifestyle   Physical activity    Days per week: Not on file    Minutes per session: Not on file   Stress: Not on file  Relationships   Social connections    Talks on phone: Not on file    Gets together: Not on file    Attends religious service: Not on file    Active member of club or organization: Not on file    Attends meetings of clubs or organizations: Not on file    Relationship status: Not on file   Intimate partner violence    Fear of current or ex partner: Not on file    Emotionally abused: Not on file    Physically abused: Not on file    Forced sexual activity: Not on file  Other Topics Concern   Not on file  Social History Narrative   Not on file    Family History  Problem Relation Age of Onset   Heart attack Mother    Cancer Father     No Known Allergies   Review of Systems   Review of Systems: Negative Unless Checked Constitutional: [] Weight loss  [] Fever  [] Chills Cardiac: [] Chest pain   []  Atrial Fibrillation  [] Palpitations   [] Shortness of breath when laying flat   [] Shortness of breath with exertion. [] Shortness of breath at rest Vascular:  [] Pain in legs with walking   [x] Pain in legs with standing [x] Pain in legs when laying flat   [x] Claudication    [x] Pain in feet when laying flat    [] History of DVT   [] Phlebitis   [x] Swelling in legs   [] Varicose veins   [] Non-healing ulcers Pulmonary:   [] Uses home oxygen   [] Productive cough   [] Hemoptysis   [] Wheeze  [x] COPD   [] Asthma Neurologic:  [] Dizziness   [] Seizures  [] Blackouts [] History of stroke   [] History of TIA  [] Aphasia   [] Temporary Blindness   [] Weakness or numbness in arm   [] Weakness  or numbness in leg Musculoskeletal:   [] Joint swelling   [] Joint pain   [] Low back pain  []  History of Knee Replacement [] Arthritis [] back Surgeries  []  Spinal Stenosis    Hematologic:  [] Easy bruising  [] Easy bleeding   [] Hypercoagulable state   [] Anemic Gastrointestinal:  [] Diarrhea   [] Vomiting  [] Gastroesophageal reflux/heartburn   [] Difficulty swallowing. [] Abdominal pain Genitourinary:  [] Chronic kidney disease   [] Difficult urination  [] Anuric   [] Blood in urine [] Frequent urination  [] Burning with urination   [] Hematuria Skin:  [] Rashes   [] Ulcers [] Wounds Psychological:  [] History of anxiety   []  History of major  depression  []  Memory Difficulties      OBJECTIVE:   Physical Exam  BP 103/77 (BP Location: Right Arm)    Pulse 98    Resp 16    Wt 217 lb (98.4 kg)    BMI 33.99 kg/m   Gen: WD/WN, NAD Head: [place]/AT, No temporalis wasting.  Ear/Nose/Throat: Hearing grossly intact, nares w/o erythema or drainage Eyes: PER, EOMI, sclera nonicteric.  Neck: Supple, no masses.  No JVD.  Pulmonary:  Good air movement, no use of accessory muscles.  Cardiac: RRR Vascular:  Vessel Right Left  Radial Palpable Palpable  Dorsalis Pedis Not Palpable Not Palpable  Posterior Tibial Not Palpable Not Palpable   Gastrointestinal: soft, non-distended. No guarding/no peritoneal signs.  Musculoskeletal: M/S 5/5 throughout.  No deformity or atrophy.  Neurologic: Pain and light touch intact in extremities.  Symmetrical.  Speech is fluent. Motor exam as listed above. Psychiatric: Judgment intact, Mood & affect appropriate for pt's clinical situation. Dermatologic: No Venous rashes. No Ulcers Noted.  No changes consistent with cellulitis. Lymph : No Cervical lymphadenopathy, no lichenification or skin changes of chronic lymphedema.       ASSESSMENT AND PLAN:  1. PAD (peripheral artery disease) ([place]) While the patient's ABIs have increased from previous studies, the patient has continued pain in his  legs and hip which sound consistent with rest pain, despite the patient's stating that this pain has been ongoing for some time.  The patient previously had stents placed within his iliac arteries in 2014, however the the patient's last study was 07/10/2017 of his iliac arteries.  At that time there was a possible greater than 50% stenosis of his external iliac arteries.  We will have the patient return for an aortoiliac study to determine patency of his stents as well as to determine if intervention is needed to possibly relieve the pain he continues to have. - VAS US AORTA/IVC/ILIACS; Future  2. Hyperlipidemia, unspecified hyperlipidemia type Continue statin as ordered and reviewed, no changes at this time   3. Type 2 diabetes mellitus with other circulatory complication, without long-term current use of insulin (HCC) Continue hypoglycemic medications as already ordered, these medications have been reviewed and there are no changes at this time.  Hgb A1C to be monitored as already arranged by primary service   4. Bilateral carotid artery stenosis Recommend:  Given the patient's asymptomatic subcritical stenosis no further invasive testing or surgery at this time.  Velocities within the right internal carotid artery are consistent with a 40 to 59% stenosis.  The left carotid artery has no evidence of stenosis within the internal carotid artery.  Left subclavian artery flows disturbed with normal flow hemodynamics within the right subclavian artery.  This is consistent with previous studies on 05/03/2018.  Continue antiplatelet therapy as prescribed Continue management of CAD, HTN and Hyperlipidemia Healthy heart diet,  encouraged exercise at least 4 times per week Follow up in 12 months with duplex ultrasound and physical exam    Current Outpatient Medications on File Prior to Visit  Medication Sig Dispense Refill   acetaminophen (TYLENOL) 500 MG tablet Take 1,000-1,500 mg by mouth  every 4 (four) hours as needed for moderate pain or headache.      albuterol (PROVENTIL HFA;VENTOLIN HFA) 108 (90 Base) MCG/ACT inhaler Inhale 2 puffs into the lungs every 6 (six) hours as needed for wheezing or shortness of breath.     allopurinol (ZYLOPRIM) 100 MG tablet Take 100 mg by mouth daily.  5  aspirin EC 81 MG tablet Take 81 mg by mouth daily.     carvedilol (COREG) 6.25 MG tablet Take 6.25 mg by mouth 2 (two) times daily with a meal.     cyanocobalamin 1000 MCG tablet Take 1,000 mcg by mouth daily.     DULoxetine (CYMBALTA) 30 MG capsule Take 30 mg by mouth every evening.   1   furosemide (LASIX) 40 MG tablet Take 40 mg by mouth 2 (two) times daily.      glipiZIDE (GLUCOTROL XL) 10 MG 24 hr tablet Take 10 mg by mouth 2 (two) times daily.      HYDROcodone-acetaminophen (NORCO) 7.5-325 MG tablet Take 1 tablet by mouth at bedtime.      insulin NPH-regular Human (NOVOLIN 70/30) (70-30) 100 UNIT/ML injection Inject 30 Units into the skin 2 (two) times daily with a meal.      magnesium gluconate (MAGONATE) 500 MG tablet Take 500 mg by mouth daily.     metFORMIN (GLUCOPHAGE) 1000 MG tablet Take 1,000 mg by mouth 2 (two) times daily with a meal.     Pirfenidone (ESBRIET) 267 MG CAPS Take 801 mg by mouth 2 (two) times daily.      pravastatin (PRAVACHOL) 40 MG tablet Take 40 mg by mouth every evening.      rOPINIRole (REQUIP) 1 MG tablet Take 1 mg by mouth at bedtime.      tamsulosin (FLOMAX) 0.4 MG CAPS capsule Take 0.4 mg by mouth every evening.   3   tiotropium (SPIRIVA) 18 MCG inhalation capsule Place 18 mcg into inhaler and inhale daily.     VICTOZA 18 MG/3ML SOPN Inject 0.6 mg into the skin daily.      No current facility-administered medications on file prior to visit.     There are no Patient Instructions on file for this visit. No follow-ups on file.   Kris Hartmann, NP  This note was completed with Sales executive.  Any errors are purely unintentional.

## 2019-01-19 DIAGNOSIS — J449 Chronic obstructive pulmonary disease, unspecified: Secondary | ICD-10-CM | POA: Diagnosis not present

## 2019-01-21 ENCOUNTER — Other Ambulatory Visit (INDEPENDENT_AMBULATORY_CARE_PROVIDER_SITE_OTHER): Payer: Self-pay | Admitting: Nurse Practitioner

## 2019-01-21 DIAGNOSIS — I739 Peripheral vascular disease, unspecified: Secondary | ICD-10-CM

## 2019-01-23 ENCOUNTER — Encounter (INDEPENDENT_AMBULATORY_CARE_PROVIDER_SITE_OTHER): Payer: Self-pay | Admitting: Nurse Practitioner

## 2019-01-23 ENCOUNTER — Other Ambulatory Visit: Payer: Self-pay

## 2019-01-23 ENCOUNTER — Ambulatory Visit (INDEPENDENT_AMBULATORY_CARE_PROVIDER_SITE_OTHER): Payer: Medicare HMO

## 2019-01-23 ENCOUNTER — Ambulatory Visit (INDEPENDENT_AMBULATORY_CARE_PROVIDER_SITE_OTHER): Payer: Medicare HMO | Admitting: Nurse Practitioner

## 2019-01-23 VITALS — BP 106/74 | HR 90 | Resp 19 | Ht 67.0 in | Wt 219.0 lb

## 2019-01-23 DIAGNOSIS — I739 Peripheral vascular disease, unspecified: Secondary | ICD-10-CM

## 2019-01-23 DIAGNOSIS — E785 Hyperlipidemia, unspecified: Secondary | ICD-10-CM | POA: Diagnosis not present

## 2019-01-23 DIAGNOSIS — Z79899 Other long term (current) drug therapy: Secondary | ICD-10-CM

## 2019-01-23 DIAGNOSIS — Z7984 Long term (current) use of oral hypoglycemic drugs: Secondary | ICD-10-CM | POA: Diagnosis not present

## 2019-01-23 DIAGNOSIS — E1142 Type 2 diabetes mellitus with diabetic polyneuropathy: Secondary | ICD-10-CM

## 2019-01-23 DIAGNOSIS — Z87891 Personal history of nicotine dependence: Secondary | ICD-10-CM

## 2019-01-23 NOTE — Progress Notes (Signed)
SUBJECTIVE:  Patient ID: [name], male    DOB: 11-Oct-1960, 58 y.o.   MRN: [id number] Chief Complaint  Patient presents with  . Follow-up    HPI  [name] is a 58 y.o. male that returns today for noninvasive studies to review leg pain.  He notes interval shortening of his claudication distance and he is describing rest pain symptoms.  He states that the pain is mostly within his hips however he does have issues within his thighs as well.  The patient has had previous peripheral vascular intervention.  He has stents within his right common iliac and left external iliac.  He has also had common femoral artery angioplasties.  The patient denies any current wounds or ulcerations.  He denies any fever, chills, nausea, vomiting or diarrhea.  He denies any chest pain or shortness of breath.  Today the patient underwent an aortoiliac duplex which revealed monophasic flow with in the left common iliac and external iliac arteries.  The right external iliac artery had mostly monophasic flow with biphasic in the common iliac artery.  The patient also underwent bilateral lower extremity arterial duplexes which revealed a right SFA which is occluded just past the region.  He has collateral feet to the popliteal artery with monophasic flow distally.  His left SFA is included entirely also with collateral feet to the popliteal artery with monophasic flow distally.  Past Medical History:  Diagnosis Date  . Cancer ([place])    skin (scalp)  . CHF (congestive heart failure) ([place])   . COPD (chronic obstructive pulmonary disease) ([place])   . Coronary artery disease   . Diabetes mellitus without complication ([place])   . MI (myocardial infarction) [facility])     Past Surgical History:  Procedure Laterality Date  . APPLICATION OF WOUND VAC     chest wound s/p CABG  . CARDIAC CATHETERIZATION    . CORONARY ARTERY BYPASS GRAFT  2015   3 vessels  . ENDARTERECTOMY Left 09/08/2017   Procedure:  ENDARTERECTOMY CAROTID;  Surgeon: Katha Cabal, MD;  Location: [facility];  Service: Vascular;  Laterality: Left;  . PERIPHERAL VASCULAR CATHETERIZATION N/A 06/09/2015   Procedure: Abdominal Aortogram w/Lower Extremity;  Surgeon: Katha Cabal, MD;  Location: [place] [facility];  Service: Cardiovascular;  Laterality: N/A;  . PERIPHERAL VASCULAR CATHETERIZATION  06/09/2015   Procedure: Lower Extremity Intervention;  Surgeon: Katha Cabal, MD;  Location: [place] [facility];  Service: Cardiovascular;;  . stent in leg Left   . WOUND DEBRIDEMENT     debridement of chest wound x 2 s/p CABG     Social History   Socioeconomic History  . Marital status: Married    Spouse name: Not on file  . Number of children: Not on file  . Years of education: Not on file  . Highest education level: Not on file  Occupational History  . Not on file  Social Needs  . Financial resource strain: Not on file  . Food insecurity    Worry: Not on file    Inability: Not on file  . Transportation needs    Medical: Not on file    Non-medical: Not on file  Tobacco Use  . Smoking status: Former Research scientist (life sciences)  . Smokeless tobacco: Never Used  Substance and Sexual Activity  . Alcohol use: Yes    Comment: seldom  . Drug use: No  . Sexual activity: Not on file  Lifestyle  . Physical activity  Days per week: Not on file    Minutes per session: Not on file  . Stress: Not on file  Relationships  . Social Herbalist on phone: Not on file    Gets together: Not on file    Attends religious service: Not on file    Active member of club or organization: Not on file    Attends meetings of clubs or organizations: Not on file    Relationship status: Not on file  . Intimate partner violence    Fear of current or ex partner: Not on file    Emotionally abused: Not on file    Physically abused: Not on file    Forced sexual activity: Not on file  Other Topics Concern  . Not on file  Social  History Narrative  . Not on file    Family History  Problem Relation Age of Onset  . Heart attack Mother   . Cancer Father     No Known Allergies   Review of Systems   Review of Systems: Negative Unless Checked Constitutional: [] Weight loss  [] Fever  [] Chills Cardiac: [] Chest pain   []  Atrial Fibrillation  [] Palpitations   [] Shortness of breath when laying flat   [] Shortness of breath with exertion. [] Shortness of breath at rest Vascular:  [x] Pain in legs with walking   [] Pain in legs with standing [x] Pain in legs when laying flat   [x] Claudication    [] Pain in feet when laying flat    [] History of DVT   [] Phlebitis   [x] Swelling in legs   [] Varicose veins   [] Non-healing ulcers Pulmonary:   [] Uses home oxygen   [] Productive cough   [] Hemoptysis   [] Wheeze  [x] COPD   [] Asthma Neurologic:  [] Dizziness   [] Seizures  [] Blackouts [] History of stroke   [] History of TIA  [] Aphasia   [] Temporary Blindness   [] Weakness or numbness in arm   [] Weakness or numbness in leg Musculoskeletal:   [] Joint swelling   [] Joint pain   [] Low back pain  []  History of Knee Replacement [] Arthritis [] back Surgeries  []  Spinal Stenosis    Hematologic:  [] Easy bruising  [] Easy bleeding   [] Hypercoagulable state   [] Anemic Gastrointestinal:  [] Diarrhea   [] Vomiting  [] Gastroesophageal reflux/heartburn   [] Difficulty swallowing. [] Abdominal pain Genitourinary:  [] Chronic kidney disease   [] Difficult urination  [] Anuric   [] Blood in urine [] Frequent urination  [] Burning with urination   [] Hematuria Skin:  [] Rashes   [] Ulcers [] Wounds Psychological:  [] History of anxiety   []  History of major depression  []  Memory Difficulties      OBJECTIVE:   Physical Exam  BP 106/74 (BP Location: Right Arm)   Pulse 90   Resp 19   Ht 5\' 7"  (1.702 m)   Wt 219 lb (99.3 kg)   BMI 34.30 kg/m   Gen: WD/WN, NAD Head: [place]/AT, No temporalis wasting.  Ear/Nose/Throat: Hearing grossly intact, nares w/o erythema or drainage Eyes:  PER, EOMI, sclera nonicteric.  Neck: Supple, no masses.  No JVD.  Pulmonary:  Good air movement, no use of accessory muscles.  Cardiac: RRR Vascular:  Vessel Right Left  Radial Palpable Palpable  Dorsalis Pedis Not Palpable Not Palpable  Posterior Tibial Not Palpable Not Palpable   Gastrointestinal: soft, non-distended. No guarding/no peritoneal signs.  Musculoskeletal: M/S 5/5 throughout.  No deformity or atrophy.  Neurologic: Pain and light touch intact in extremities.  Symmetrical.  Speech is fluent. Motor exam as listed above. Psychiatric: Judgment intact, Mood &  affect appropriate for pt's clinical situation. Dermatologic: No Venous rashes. No Ulcers Noted.  No changes consistent with cellulitis. Lymph : No Cervical lymphadenopathy, no lichenification or skin changes of chronic lymphedema.       ASSESSMENT AND PLAN:  1. PAD (peripheral artery disease) ([place]) In reviewing previous op notes done on 06/09/2015 it was known that the patient's right SFA was occluded after multiple unsuccessful attempts to cross the lesion.  At that time the possibility of femoral endarterectomy as well as bypass surgery were discussed.  Since that time the patient has been able to tolerate the occlusion based on his collaterals however it seems that it this may no longer be the case.  Discussed with patient that in order to fully plan for the procedure an angiogram would be needed first.  Discussed that there may be intervention done or this may be purely diagnostic depending on what is found the patient understands and agrees.  Recommend:  The patient has evidence of severe atherosclerotic changes of both lower extremities with rest pain that is associated with preulcerative changes and impending tissue loss of the foot.  This represents a limb threatening ischemia and places the patient at the risk for limb loss.  Patient should undergo angiography of the lower extremities with the hope for  intervention for limb salvage.  The risks and benefits as well as the alternative therapies was discussed in detail with the patient.  All questions were answered.  Patient agrees to proceed with angiography.        2. Hyperlipidemia, unspecified hyperlipidemia type Continue statin as ordered and reviewed, no changes at this time   3. Type 2 diabetes mellitus with peripheral neuropathy (HCC) Continue hypoglycemic medications as already ordered, these medications have been reviewed and there are no changes at this time.  Hgb A1C to be monitored as already arranged by primary service    Current Outpatient Medications on File Prior to Visit  Medication Sig Dispense Refill  . acetaminophen (TYLENOL) 500 MG tablet Take 1,000-1,500 mg by mouth every 4 (four) hours as needed for moderate pain or headache.     . albuterol (PROVENTIL HFA;VENTOLIN HFA) 108 (90 Base) MCG/ACT inhaler Inhale 2 puffs into the lungs every 6 (six) hours as needed for wheezing or shortness of breath.    . allopurinol (ZYLOPRIM) 100 MG tablet Take 100 mg by mouth daily.  5  . aspirin EC 81 MG tablet Take 81 mg by mouth daily.    . carvedilol (COREG) 6.25 MG tablet Take 6.25 mg by mouth 2 (two) times daily with a meal.    . cyanocobalamin 1000 MCG tablet Take 1,000 mcg by mouth daily.    . DULoxetine (CYMBALTA) 30 MG capsule Take 30 mg by mouth every evening.   1  . furosemide (LASIX) 40 MG tablet Take 40 mg by mouth 2 (two) times daily.     Marland Kitchen glipiZIDE (GLUCOTROL XL) 10 MG 24 hr tablet Take 10 mg by mouth 2 (two) times daily.     Marland Kitchen HYDROcodone-acetaminophen (NORCO) 7.5-325 MG tablet Take 1 tablet by mouth at bedtime.     . insulin NPH-regular Human (NOVOLIN 70/30) (70-30) 100 UNIT/ML injection Inject 30 Units into the skin 2 (two) times daily with a meal.     . magnesium gluconate (MAGONATE) 500 MG tablet Take 500 mg by mouth daily.    . metFORMIN (GLUCOPHAGE) 1000 MG tablet Take 1,000 mg by mouth 2 (two) times daily  with a meal.    .  Pirfenidone (ESBRIET) 267 MG CAPS Take 801 mg by mouth 2 (two) times daily.     . pravastatin (PRAVACHOL) 40 MG tablet Take 40 mg by mouth every evening.     Marland Kitchen rOPINIRole (REQUIP) 1 MG tablet Take 1 mg by mouth at bedtime.     . tamsulosin (FLOMAX) 0.4 MG CAPS capsule Take 0.4 mg by mouth every evening.   3  . tiotropium (SPIRIVA) 18 MCG inhalation capsule Place 18 mcg into inhaler and inhale daily.    Marland Kitchen VICTOZA 18 MG/3ML SOPN Inject 0.6 mg into the skin daily.      No current facility-administered medications on file prior to visit.     There are no Patient Instructions on file for this visit. No follow-ups on file.   Kris Hartmann, NP  This note was completed with Sales executive.  Any errors are purely unintentional.

## 2019-01-24 ENCOUNTER — Other Ambulatory Visit (INDEPENDENT_AMBULATORY_CARE_PROVIDER_SITE_OTHER): Payer: Self-pay | Admitting: Nurse Practitioner

## 2019-01-25 ENCOUNTER — Other Ambulatory Visit: Payer: Self-pay

## 2019-01-25 ENCOUNTER — Other Ambulatory Visit
Admission: RE | Admit: 2019-01-25 | Discharge: 2019-01-25 | Disposition: A | Discharge status: Home / Self Care | Payer: Medicare HMO | Source: Ambulatory Visit | Attending: Vascular Surgery | Admitting: Vascular Surgery

## 2019-01-25 DIAGNOSIS — Z1159 Encounter for screening for other viral diseases: Secondary | ICD-10-CM | POA: Insufficient documentation

## 2019-01-25 DIAGNOSIS — Z01812 Encounter for preprocedural laboratory examination: Secondary | ICD-10-CM | POA: Insufficient documentation

## 2019-01-26 DIAGNOSIS — J9601 Acute respiratory failure with hypoxia: Secondary | ICD-10-CM | POA: Diagnosis not present

## 2019-01-26 DIAGNOSIS — J449 Chronic obstructive pulmonary disease, unspecified: Secondary | ICD-10-CM | POA: Diagnosis not present

## 2019-01-26 LAB — SARS CORONAVIRUS 2 (TAT 6-24 HRS): SARS Coronavirus 2: NEGATIVE

## 2019-01-28 ENCOUNTER — Encounter: Payer: Self-pay | Admitting: Psychiatry

## 2019-01-28 ENCOUNTER — Ambulatory Visit (INDEPENDENT_AMBULATORY_CARE_PROVIDER_SITE_OTHER): Payer: Medicare HMO | Admitting: Psychiatry

## 2019-01-28 ENCOUNTER — Other Ambulatory Visit: Payer: Self-pay

## 2019-01-28 DIAGNOSIS — G894 Chronic pain syndrome: Secondary | ICD-10-CM | POA: Insufficient documentation

## 2019-01-28 DIAGNOSIS — G4701 Insomnia due to medical condition: Secondary | ICD-10-CM | POA: Insufficient documentation

## 2019-01-28 DIAGNOSIS — Z008 Encounter for other general examination: Secondary | ICD-10-CM | POA: Diagnosis not present

## 2019-01-28 MED ORDER — CEFAZOLIN SODIUM-DEXTROSE 2-4 GM/100ML-% IV SOLN
2.0000 g | Freq: Once | INTRAVENOUS | Status: AC
  Administered 2019-01-29: 2 g via INTRAVENOUS
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
## 2019-01-29 ENCOUNTER — Ambulatory Visit
Admission: RE | Admit: 2019-01-29 | Discharge: 2019-01-29 | Disposition: A | Discharge status: Home / Self Care | Payer: Medicare HMO | Attending: Vascular Surgery | Admitting: Vascular Surgery

## 2019-01-29 ENCOUNTER — Telehealth (INDEPENDENT_AMBULATORY_CARE_PROVIDER_SITE_OTHER): Payer: Self-pay | Admitting: Vascular Surgery

## 2019-01-29 ENCOUNTER — Encounter: Payer: Self-pay | Admitting: *Deleted

## 2019-01-29 ENCOUNTER — Encounter
Admission: RE | Disposition: A | Discharge status: Home / Self Care | Payer: Self-pay | Source: Home / Self Care | Attending: Vascular Surgery

## 2019-01-29 ENCOUNTER — Other Ambulatory Visit (INDEPENDENT_AMBULATORY_CARE_PROVIDER_SITE_OTHER): Payer: Self-pay | Admitting: Vascular Surgery

## 2019-01-29 ENCOUNTER — Other Ambulatory Visit: Payer: Self-pay

## 2019-01-29 DIAGNOSIS — Z7982 Long term (current) use of aspirin: Secondary | ICD-10-CM | POA: Diagnosis not present

## 2019-01-29 DIAGNOSIS — Z794 Long term (current) use of insulin: Secondary | ICD-10-CM | POA: Diagnosis not present

## 2019-01-29 DIAGNOSIS — I70223 Atherosclerosis of native arteries of extremities with rest pain, bilateral legs: Secondary | ICD-10-CM | POA: Insufficient documentation

## 2019-01-29 DIAGNOSIS — Z87891 Personal history of nicotine dependence: Secondary | ICD-10-CM | POA: Insufficient documentation

## 2019-01-29 DIAGNOSIS — Z79899 Other long term (current) drug therapy: Secondary | ICD-10-CM | POA: Insufficient documentation

## 2019-01-29 DIAGNOSIS — I509 Heart failure, unspecified: Secondary | ICD-10-CM | POA: Insufficient documentation

## 2019-01-29 DIAGNOSIS — Z951 Presence of aortocoronary bypass graft: Secondary | ICD-10-CM | POA: Insufficient documentation

## 2019-01-29 DIAGNOSIS — I70229 Atherosclerosis of native arteries of extremities with rest pain, unspecified extremity: Secondary | ICD-10-CM

## 2019-01-29 DIAGNOSIS — J449 Chronic obstructive pulmonary disease, unspecified: Secondary | ICD-10-CM | POA: Diagnosis not present

## 2019-01-29 DIAGNOSIS — E1142 Type 2 diabetes mellitus with diabetic polyneuropathy: Secondary | ICD-10-CM | POA: Diagnosis not present

## 2019-01-29 DIAGNOSIS — E785 Hyperlipidemia, unspecified: Secondary | ICD-10-CM | POA: Insufficient documentation

## 2019-01-29 DIAGNOSIS — Z8249 Family history of ischemic heart disease and other diseases of the circulatory system: Secondary | ICD-10-CM | POA: Diagnosis not present

## 2019-01-29 DIAGNOSIS — I2510 Atherosclerotic heart disease of native coronary artery without angina pectoris: Secondary | ICD-10-CM | POA: Insufficient documentation

## 2019-01-29 DIAGNOSIS — I252 Old myocardial infarction: Secondary | ICD-10-CM | POA: Diagnosis not present

## 2019-01-29 HISTORY — DX: Pneumonia, unspecified organism: J18.9

## 2019-01-29 HISTORY — DX: Peripheral vascular disease, unspecified: I73.9

## 2019-01-29 HISTORY — DX: Fibromyalgia: M79.7

## 2019-01-29 HISTORY — DX: Dyspnea, unspecified: R06.00

## 2019-01-29 HISTORY — PX: LOWER EXTREMITY ANGIOGRAPHY: CATH118251

## 2019-01-29 HISTORY — DX: Personal history of urinary calculi: Z87.442

## 2019-01-29 LAB — CREATININE, SERUM
Creatinine, Ser: 1.01 mg/dL (ref 0.61–1.24)
GFR calc Af Amer: >60 mL/min (ref >60)
GFR calc non Af Amer: >60 mL/min (ref >60)

## 2019-01-29 LAB — GLUCOSE, CAPILLARY
Glucose-Capillary: 163 mg/dL — ABNORMAL HIGH (ref 70–99)
Glucose-Capillary: 229 mg/dL — ABNORMAL HIGH (ref 70–99)
Glucose-Capillary: 253 mg/dL — ABNORMAL HIGH (ref 70–99)

## 2019-01-29 LAB — BUN: BUN: 21 mg/dL — ABNORMAL HIGH (ref 6–20)

## 2019-01-29 SURGERY — LOWER EXTREMITY ANGIOGRAPHY
Anesthesia: Moderate Sedation | Site: Leg Lower | Laterality: Right

## 2019-01-29 MED ORDER — CLOPIDOGREL BISULFATE 300 MG PO TABS
300.0000 mg | ORAL_TABLET | ORAL | Status: AC
  Administered 2019-01-29: 12:00:00 300 mg via ORAL

## 2019-01-29 MED ORDER — SODIUM CHLORIDE 0.9 % IV SOLN: 250.0000 mL | INTRAVENOUS | Status: DC | PRN

## 2019-01-29 MED ORDER — DIPHENHYDRAMINE HCL 50 MG/ML IJ SOLN: 50.0000 mg | Freq: Once | INTRAMUSCULAR | Status: DC | PRN

## 2019-01-29 MED ORDER — MIDAZOLAM HCL 5 MG/5ML IJ SOLN
INTRAMUSCULAR | Status: AC
  Filled 2019-01-29: qty 5

## 2019-01-29 MED ORDER — HEPARIN SODIUM (PORCINE) 1000 UNIT/ML IJ SOLN
INTRAMUSCULAR | Status: AC
  Filled 2019-01-29: qty 1

## 2019-01-29 MED ORDER — IODIXANOL 320 MG/ML IV SOLN
INTRAVENOUS | Status: DC | PRN
  Administered 2019-01-29: 115 mL via INTRA_ARTERIAL

## 2019-01-29 MED ORDER — POTASSIUM CHLORIDE CRYS ER 20 MEQ PO TBCR
EXTENDED_RELEASE_TABLET | ORAL | Status: AC
  Administered 2019-01-29: 40 meq via ORAL
  Filled 2019-01-29: qty 2

## 2019-01-29 MED ORDER — FUROSEMIDE 10 MG/ML IJ SOLN
20.0000 mg | Freq: Once | INTRAMUSCULAR | Status: AC
  Administered 2019-01-29: 20 mg via INTRAVENOUS

## 2019-01-29 MED ORDER — LABETALOL HCL 5 MG/ML IV SOLN: 10.0000 mg | INTRAVENOUS | Status: DC | PRN

## 2019-01-29 MED ORDER — ONDANSETRON HCL 4 MG/2ML IJ SOLN: 4.0000 mg | Freq: Four times a day (QID) | INTRAMUSCULAR | Status: DC | PRN

## 2019-01-29 MED ORDER — IPRATROPIUM-ALBUTEROL 0.5-2.5 (3) MG/3ML IN SOLN
RESPIRATORY_TRACT | Status: AC
  Filled 2019-01-29: qty 3

## 2019-01-29 MED ORDER — FENTANYL CITRATE (PF) 100 MCG/2ML IJ SOLN
INTRAMUSCULAR | Status: AC
  Filled 2019-01-29: qty 2

## 2019-01-29 MED ORDER — MIDAZOLAM HCL 2 MG/2ML IJ SOLN
INTRAMUSCULAR | Status: DC | PRN
  Administered 2019-01-29: 0.5 mg via INTRAVENOUS
  Administered 2019-01-29: 1 mg via INTRAVENOUS
  Administered 2019-01-29: 2 mg via INTRAVENOUS

## 2019-01-29 MED ORDER — SODIUM CHLORIDE 0.9 % IV SOLN
INTRAVENOUS | Status: DC
  Administered 2019-01-29: 08:00:00 via INTRAVENOUS

## 2019-01-29 MED ORDER — MIDAZOLAM HCL 2 MG/ML PO SYRP: 8.0000 mg | ORAL_SOLUTION | Freq: Once | ORAL | Status: DC | PRN

## 2019-01-29 MED ORDER — FAMOTIDINE 20 MG PO TABS: 40.0000 mg | ORAL_TABLET | Freq: Once | ORAL | Status: DC | PRN

## 2019-01-29 MED ORDER — HYDRALAZINE HCL 20 MG/ML IJ SOLN: 5.0000 mg | INTRAMUSCULAR | Status: DC | PRN

## 2019-01-29 MED ORDER — OXYCODONE HCL 5 MG PO TABS
5.0000 mg | ORAL_TABLET | ORAL | Status: DC | PRN
  Administered 2019-01-29: 10 mg via ORAL

## 2019-01-29 MED ORDER — ACETAMINOPHEN 325 MG PO TABS: 650.0000 mg | ORAL_TABLET | ORAL | Status: DC | PRN

## 2019-01-29 MED ORDER — SODIUM CHLORIDE 0.9 % IV SOLN: INTRAVENOUS | Status: DC

## 2019-01-29 MED ORDER — LIDOCAINE HCL (PF) 1 % IJ SOLN
INTRAMUSCULAR | Status: AC
  Filled 2019-01-29: qty 30

## 2019-01-29 MED ORDER — HYDROMORPHONE HCL 1 MG/ML IJ SOLN: 1.0000 mg | Freq: Once | INTRAMUSCULAR | Status: DC | PRN

## 2019-01-29 MED ORDER — POTASSIUM CHLORIDE CRYS ER 20 MEQ PO TBCR
40.0000 meq | EXTENDED_RELEASE_TABLET | Freq: Two times a day (BID) | ORAL | Status: DC
  Administered 2019-01-29: 40 meq via ORAL

## 2019-01-29 MED ORDER — CLOPIDOGREL BISULFATE 75 MG PO TABS
ORAL_TABLET | ORAL | Status: AC
  Administered 2019-01-29: 300 mg via ORAL
  Filled 2019-01-29: qty 4

## 2019-01-29 MED ORDER — FENTANYL CITRATE (PF) 100 MCG/2ML IJ SOLN
INTRAMUSCULAR | Status: DC | PRN
  Administered 2019-01-29 (×2): 25 ug via INTRAVENOUS
  Administered 2019-01-29 (×2): 50 ug via INTRAVENOUS

## 2019-01-29 MED ORDER — SODIUM CHLORIDE 0.9% FLUSH: 3.0000 mL | Freq: Two times a day (BID) | INTRAVENOUS | Status: DC

## 2019-01-29 MED ORDER — MORPHINE SULFATE (PF) 4 MG/ML IV SOLN: 2.0000 mg | INTRAVENOUS | Status: DC | PRN

## 2019-01-29 MED ORDER — IPRATROPIUM-ALBUTEROL 0.5-2.5 (3) MG/3ML IN SOLN: 3.0000 mL | Freq: Four times a day (QID) | RESPIRATORY_TRACT | Status: DC

## 2019-01-29 MED ORDER — FUROSEMIDE 10 MG/ML IJ SOLN
INTRAMUSCULAR | Status: AC
  Administered 2019-01-29: 20 mg via INTRAVENOUS
  Filled 2019-01-29: qty 2

## 2019-01-29 MED ORDER — CLOPIDOGREL BISULFATE 75 MG PO TABS: 75.0000 mg | ORAL_TABLET | Freq: Every day | ORAL | 4 refills | Status: DC

## 2019-01-29 MED ORDER — HEPARIN SODIUM (PORCINE) 1000 UNIT/ML IJ SOLN
INTRAMUSCULAR | Status: DC | PRN
  Administered 2019-01-29: 5000 [IU] via INTRAVENOUS

## 2019-01-29 MED ORDER — OXYCODONE HCL 5 MG PO TABS
ORAL_TABLET | ORAL | Status: AC
  Filled 2019-01-29: qty 2

## 2019-01-29 MED ORDER — METHYLPREDNISOLONE SODIUM SUCC 125 MG IJ SOLR: 125.0000 mg | Freq: Once | INTRAMUSCULAR | Status: DC | PRN

## 2019-01-29 MED ORDER — SODIUM CHLORIDE 0.9% FLUSH: 3.0000 mL | INTRAVENOUS | Status: DC | PRN

## 2019-01-29 MED ORDER — HYDROCODONE-ACETAMINOPHEN 5-325 MG PO TABS: 1.0000 | ORAL_TABLET | Freq: Four times a day (QID) | ORAL | 0 refills | Status: DC | PRN

## 2019-01-29 SURGICAL SUPPLY — 41 items
BALLN DORADO 6X100X135 (BALLOONS) ×3
BALLN LUTONIX 5X220X130 (BALLOONS) ×6
BALLN LUTONIX DCB 6X40X130 (BALLOONS) ×6
BALLN LUTONIX DCB 7X60X130 (BALLOONS) ×3
BALLN ULTRASCORE 014 3X200X150 (BALLOONS) ×3
BALLN ULTRASCORE 014 4X200X150 (BALLOONS) ×3
BALLN ULTRASCORE 6X40X130 (BALLOONS) ×3
BALLOON DORADO 6X100X135 (BALLOONS) IMPLANT
BALLOON LUTONIX 5X220X130 (BALLOONS) IMPLANT
BALLOON LUTONIX DCB 6X40X130 (BALLOONS) IMPLANT
BALLOON LUTONIX DCB 7X60X130 (BALLOONS) IMPLANT
BALLOON ULTRASCORE 6X40X130 (BALLOONS) IMPLANT
BALLOON ULTRSCRE 014 3X200X150 (BALLOONS) IMPLANT
BALLOON ULTRSCRE 014 4X200X150 (BALLOONS) IMPLANT
CANNULA 5F STIFF (CANNULA) ×4 IMPLANT
CATH CROSSER 14S OTW 146CM (CATHETERS) ×2 IMPLANT
CATH PIG 70CM (CATHETERS) ×2 IMPLANT
CATH SKICK ANG 70CM (CATHETERS) ×2 IMPLANT
CATH VERT 5FR 125CM (CATHETERS) ×2 IMPLANT
DEVICE PRESTO INFLATION (MISCELLANEOUS) ×2 IMPLANT
DEVICE STARCLOSE SE CLOSURE (Vascular Products) ×2 IMPLANT
GLIDEWIRE ADV .035X260CM (WIRE) ×2 IMPLANT
GUIDEWIRE SUPER STIFF .035X180 (WIRE) ×2 IMPLANT
IV NS 250ML (IV SOLUTION) ×2
IV NS 250ML BAXH (IV SOLUTION) IMPLANT
KIT FLOWMATE PROCEDURAL (KITS) ×2 IMPLANT
LIFESTENT SOLO 6X200X135 (Permanent Stent) ×4 IMPLANT
NDL ENTRY 21GA 7CM ECHOTIP (NEEDLE) IMPLANT
NEEDLE ENTRY 21GA 7CM ECHOTIP (NEEDLE) ×3 IMPLANT
PACK ANGIOGRAPHY (CUSTOM PROCEDURE TRAY) ×3 IMPLANT
SET INTRO CAPELLA COAXIAL (SET/KITS/TRAYS/PACK) ×2 IMPLANT
SHEATH BRITE TIP 5FRX11 (SHEATH) ×2 IMPLANT
SHEATH FLEXOR ANSEL2 7FRX45 (SHEATH) ×2 IMPLANT
STENT LIFESTENT 5F 6X40X135 (Permanent Stent) ×2 IMPLANT
STENT VIABAHN 7X7.5X120 (Permanent Stent) ×2 IMPLANT
SYR MEDRAD MARK 7 150ML (SYRINGE) ×2 IMPLANT
TOWEL OR 17X26 4PK STRL BLUE (TOWEL DISPOSABLE) ×2 IMPLANT
TUBING CONTRAST HIGH PRESS 72 (TUBING) ×2 IMPLANT
WIRE J 3MM .035X145CM (WIRE) ×2 IMPLANT
WIRE MAGIC TORQUE 315CM (WIRE) ×2 IMPLANT
WIRE SPARTACORE .014X300CM (WIRE) ×2 IMPLANT

## 2019-01-29 NOTE — Telephone Encounter (Signed)
Per Dr. Delana Meyer- script for [facility] sent to [facility] on [address].  Patient wife verbalized understanding. AS, CMA

## 2019-01-29 NOTE — Discharge Instructions (Signed)

## 2019-01-29 NOTE — OR Nursing (Signed)
Pre procedure pt stated he planned to go to [place] this afternoon for friends [facility]. I told him that I didn't think Dr Delana Meyer would not advise this due to risk of bleeding. I offered his the opportunity to reschedule but pt said he wanted to proceed with procedure. He acknowledged information.

## 2019-01-29 NOTE — OR Nursing (Signed)
Dr Delana Meyer informed: Attempted to wean off oxygen but pt reported respiratory distress. Replaced non rebreather at 12 liters, lasix 20 mg given and 40 kdur po ns reduced to 20 ml/hr. condon cath placed.

## 2019-01-29 NOTE — OR Nursing (Signed)
Dr Delana Meyer informed of continued respiratory distress, duoneb ordered, bladder scan ordered (no urine outpt post lasix)

## 2019-01-29 NOTE — OR Nursing (Signed)
Pt remains on 3 liters [place], wife to bring portable oxygen for transfer home. Multiple attempts to wean to room air but saturations drop into upper 80"s with shortness of breath.

## 2019-01-29 NOTE — Progress Notes (Signed)
After pt transferred to stretcher, pt c/o difficulty breathing, Spo2 noted to be 77%, NRB applied.  Blood noted on pt's blanket, pressure held at femoral site by vascular lab staff.

## 2019-01-29 NOTE — H&P (Signed)
[facility] History & Physical Update  The patient was interviewed and re-examined.  The patient's previous History and Physical has been reviewed and is unchanged.  There is no change in the plan of care. We plan to proceed with the scheduled procedure.  Hortencia Pilar, MD  01/29/2019, 8:17 AM

## 2019-01-29 NOTE — OR Nursing (Signed)
Bladder scan lesss than 200 ml. Slight improvement of respiratory status post duoneb. Pt reports he will feel better after he can sit up. Dr Delana Meyer notified

## 2019-01-29 NOTE — Op Note (Signed)
[facility] Percutaneous Study/Intervention Procedural Note   Date of Surgery: 01/29/2019  Surgeon:  Katha Cabal, MD.  Pre-operative Diagnosis: Atherosclerotic occlusive disease bilateral lower extremities with rest pain bilaterally  Post-operative diagnosis: Same  Procedure(s) Performed: 1. Introduction catheter into right lower extremity 3rd order catheter placement  2. Contrast injection right lower extremity for distal runoff   3. Crosser atherectomy of the right SFA and popliteal arteries 4.  Percutaneous transluminal angioplasty and stent placement right superficial femoral artery and popliteal             5.    Percutaneous transluminal angioplasty of the right external iliac artery to 7 mm                      6.   Star close closure left common femoral arteriotomy                Anesthesia: Conscious sedation was administered by the radiology RN under my direct supervision. IV Versed plus fentanyl were utilized. Continuous ECG, pulse oximetry and blood pressure was monitored throughout the entire procedure. Conscious sedation was for a total of 140 minutes.  Sheath: 7 [place] Ansell left common femoral retrograde  Contrast: 115 cc  Fluoroscopy Time: 25 minutes  Indications: [name] presents with worsening rest pain of his lower extremities.  He is known atherosclerotic occlusive disease.  He is undergone several attempts in the past to treat his SFA occlusion on the right which were not successful.  Initially after those procedures he felt that he was doing reasonably well and chose to decline bypass surgery.  Lately his leg pain has increased substantially he is now describing rest pain as well.  He is therefore undergoing angiography with the hope for intervention but also as potential planning for surgical bypass.  The risks and benefits are reviewed all questions answered patient agrees  to proceed.  Procedure: ARHAN MCMANAMON is a 58 y.o. y.o. male who was identified and appropriate procedural time out was performed. The patient was then placed supine on the table and prepped and draped in the usual sterile fashion.   Ultrasound was placed in the sterile sleeve and the left groin was evaluated the left common femoral artery was echolucent and pulsatile indicating patency.  Image was recorded for the permanent record and under real-time visualization a microneedle was inserted into the common femoral artery microwire followed by a micro-sheath.  A J-wire was then advanced through the micro-sheath and a  5 [place] sheath was then inserted over a J-wire. J-wire was then advanced and a 5 French pigtail catheter was positioned at the level of T12. AP projection of the aorta was then obtained. Pigtail catheter was repositioned to above the bifurcation and a LAO view of the pelvis was obtained.  Subsequently a pigtail catheter with the advantage wire was used to cross the aortic bifurcation the catheter wire were advanced down into the right distal external iliac artery. Oblique view of the femoral bifurcation was then obtained and subsequently the wire was reintroduced and the pigtail catheter negotiated into the SFA representing third order catheter placement. Distal runoff was then performed.  Diagnostic interpretation: The abdominal aorta is opacified with a bolus injection of contrast.  No hemodynamically significant lesions are noted.  Bilateral single renal arteries are identified no evidence of hemodynamically significant renal artery stenosis are noted.  The aortic bifurcation is widely patent.  The right common iliac artery  and previously placed stent are widely patent no evidence of in-stent restenosis.  The proximal two thirds of the external iliac artery are widely patent.  The left common iliac artery demonstrates a greater than 70% stenosis diffusely throughout its course.   Previously placed external iliac artery stent is patent as is the distal left external iliac artery.  Distally there is a greater than 80% stenosis extending into the proximal common femoral.  The mid and distal common femoral are widely patent.  Origin of the profunda femoris is widely patent and the profunda femoris is free of hemodynamically significant stenosis.  It demonstrates extensive collateralization down to the geniculate's around the knee.  SFA occludes shortly after its origin remains occluded throughout its entire course as is the above-knee popliteal.  At the level of the tibial plateau the mid to distal popliteal is reconstituted and the trifurcation is patent with three-vessel runoff to the foot.  Posterior tibial appears to be the dominant runoff to the foot but there is filling of the dorsalis pedis directly from the anterior tibial as well.  Based on these findings I elected to proceed with intervention.  Initially, a 6 mm x 40 mm ultra score balloon was used to treat the distal external iliac lesion.  Inflation was to 12 atm for 2 minutes.  Later in the case a 7 mm x 60 mm Lutonix drug-eluting balloon was inflated across this lesion inflation was to 12 atm for 2 minutes.  Follow-up imaging demonstrated less than 20% residual stenosis  5000 units of heparin was then given and allowed to circulate and a 7 [place] Ansell sheath was advanced up and over the bifurcation and positioned in the femoral artery  The 14 S Crosser catheter was then prepped on the field and a angled side kick catheter was advanced into the cul-de-sac of the SFA under magnified imaging in the LAO projection. Using the Crosser catheter the occlusion of the SFA and popliteal was negotiated.  Injection of contrast through the Crosser side port confirmed intraluminal positioning.    0.014 [place] core wire was then advanced through the side-port of the Crosser down into the posterior tibial artery.  Crosser catheter was  removed.  A 3 mm x 20 cm ultra score balloon was then advanced and beginning at the level of the tibial plateau angioplasty was performed of the popliteal and SFA.  After 2 inflations to 10 atm a 4 mm x 20 cm ultra score balloon was advanced and again to angioplasty were performed each to 10 atm for 1 minute.  Beginning distally a 6 mm x 200 mm life stent was deployed.  A second 6 mm x 200 mm life stent was then utilized carrying the area treated more proximally.  I then balloon angioplastied the stents to fully expand them with a 5 mm x 220 mm Lutonix drug-eluting balloons inflated to 10 atm for 1 minute each.  Magnified RAO imaging of the common femoral was then obtained and a third life stent a 6 mm x 40 mm was deployed landing the proximal or leading edge right at the origin.  This was dilated with a 6 mm x 40 mm Lutonix drug-eluting balloon again inflated to 10 atm for approximately 1 minute.  Follow-up imaging now demonstrated an area of extravasation right at Hunter's canal.  Vertebral catheter was reintroduced and the 035 Magic torque wire was exchanged for a 0.014 [place] core wire.  A 7 mm x 7.5 cm via bond was  then deployed across this region and postdilated with a 6 mm [place] balloon inflated to 16 atm for 30 seconds.  Follow-up imaging demonstrated complete resolution of the extravasation and wide patency of the stent.  Distal runoff was then performed from the common femoral to the ankle and after review of these images I elected to terminate the case.  After review of these images the sheath is pulled into the left external iliac oblique of the common femoral is obtained and a Star close device deployed. There no immediate Complications.  Findings:  The abdominal aorta is opacified with a bolus injection of contrast.  No hemodynamically significant lesions are noted.  Bilateral single renal arteries are identified no evidence of hemodynamically significant renal artery stenosis are noted.   The aortic bifurcation is widely patent.  The right common iliac artery and previously placed stent are widely patent no evidence of in-stent restenosis.  The proximal two thirds of the external iliac artery are widely patent.  The left common iliac artery demonstrates a greater than 70% stenosis diffusely throughout its course.  Previously placed external iliac artery stent is patent as is the distal left external iliac artery.  Distally there is a greater than 80% stenosis extending into the proximal common femoral.  The mid and distal common femoral are widely patent.  Origin of the profunda femoris is widely patent and the profunda femoris is free of hemodynamically significant stenosis.  It demonstrates extensive collateralization down to the geniculate's around the knee.  SFA occludes shortly after its origin remains occluded throughout its entire course as is the above-knee popliteal.  At the level of the tibial plateau the mid to distal popliteal is reconstituted and the trifurcation is patent with three-vessel runoff to the foot.  Posterior tibial appears to be the dominant runoff to the foot but there is filling of the dorsalis pedis directly from the anterior tibial as well.  Crosser atherectomy of the SFA and popliteal is successful.  Balloon angioplasty and subsequent stent placement within the SFA and popliteal with postdilatation to 6 mm maximally demonstrates wide patency with less than 15% residual stenosis throughout the entire length of the SFA as well as the above-knee popliteal.  However there is an area at Hunter's canal of extravasation this is easily treated with a via bond stent posted to 6 mm as well.  There is now wide patency of the SFA and popliteal with preservation of three-vessel runoff to the ankle.  Disposition: Patient was taken to the recovery room in stable condition having tolerated the procedure well.  Belenda Cruise Wendall Isabell 01/29/2019,10:44 AM

## 2019-01-30 ENCOUNTER — Ambulatory Visit
Admission: RE | Admit: 2019-01-30 | Discharge: 2019-01-30 | Disposition: A | Discharge status: Home / Self Care | Payer: Medicare HMO | Source: Ambulatory Visit | Attending: Internal Medicine | Admitting: Internal Medicine

## 2019-01-30 ENCOUNTER — Other Ambulatory Visit: Payer: Self-pay | Admitting: Internal Medicine

## 2019-01-30 ENCOUNTER — Other Ambulatory Visit: Payer: Self-pay

## 2019-01-30 DIAGNOSIS — Z794 Long term (current) use of insulin: Secondary | ICD-10-CM | POA: Diagnosis not present

## 2019-01-30 DIAGNOSIS — R635 Abnormal weight gain: Secondary | ICD-10-CM | POA: Diagnosis not present

## 2019-01-30 DIAGNOSIS — J84112 Idiopathic pulmonary fibrosis: Secondary | ICD-10-CM | POA: Diagnosis not present

## 2019-01-30 DIAGNOSIS — R600 Localized edema: Secondary | ICD-10-CM | POA: Diagnosis not present

## 2019-01-30 DIAGNOSIS — I739 Peripheral vascular disease, unspecified: Secondary | ICD-10-CM

## 2019-01-30 DIAGNOSIS — I429 Cardiomyopathy, unspecified: Secondary | ICD-10-CM | POA: Diagnosis not present

## 2019-01-30 DIAGNOSIS — E1165 Type 2 diabetes mellitus with hyperglycemia: Secondary | ICD-10-CM | POA: Diagnosis not present

## 2019-01-30 DIAGNOSIS — M7989 Other specified soft tissue disorders: Secondary | ICD-10-CM | POA: Diagnosis not present

## 2019-01-30 DIAGNOSIS — J439 Emphysema, unspecified: Secondary | ICD-10-CM | POA: Diagnosis not present

## 2019-02-04 ENCOUNTER — Encounter: Payer: Self-pay | Admitting: Student in an Organized Health Care Education/Training Program

## 2019-02-05 ENCOUNTER — Ambulatory Visit
Payer: Medicare HMO | Attending: Student in an Organized Health Care Education/Training Program | Admitting: Student in an Organized Health Care Education/Training Program

## 2019-02-05 ENCOUNTER — Encounter: Payer: Self-pay | Admitting: Student in an Organized Health Care Education/Training Program

## 2019-02-05 ENCOUNTER — Other Ambulatory Visit: Payer: Self-pay

## 2019-02-05 DIAGNOSIS — I6523 Occlusion and stenosis of bilateral carotid arteries: Secondary | ICD-10-CM | POA: Diagnosis not present

## 2019-02-05 DIAGNOSIS — I25810 Atherosclerosis of coronary artery bypass graft(s) without angina pectoris: Secondary | ICD-10-CM | POA: Diagnosis not present

## 2019-02-05 DIAGNOSIS — I6522 Occlusion and stenosis of left carotid artery: Secondary | ICD-10-CM

## 2019-02-05 DIAGNOSIS — G8929 Other chronic pain: Secondary | ICD-10-CM

## 2019-02-05 DIAGNOSIS — E1142 Type 2 diabetes mellitus with diabetic polyneuropathy: Secondary | ICD-10-CM | POA: Diagnosis not present

## 2019-02-05 DIAGNOSIS — Z951 Presence of aortocoronary bypass graft: Secondary | ICD-10-CM | POA: Diagnosis not present

## 2019-02-05 DIAGNOSIS — I509 Heart failure, unspecified: Secondary | ICD-10-CM

## 2019-02-05 DIAGNOSIS — G894 Chronic pain syndrome: Secondary | ICD-10-CM | POA: Diagnosis not present

## 2019-02-05 DIAGNOSIS — M25511 Pain in right shoulder: Secondary | ICD-10-CM | POA: Diagnosis not present

## 2019-02-05 MED ORDER — HYDROCODONE-ACETAMINOPHEN 7.5-325 MG PO TABS: 1.0000 | ORAL_TABLET | Freq: Two times a day (BID) | ORAL | 0 refills | Status: DC | PRN

## 2019-02-05 NOTE — Progress Notes (Signed)
Patient's Name: [name]  MRN: [id number]  Referring Provider: Tracie Harrier, MD  DOB: 1960/09/03  PCP: Tracie Harrier, MD  DOS: 02/05/2019  Note by: Gillis Santa, MD  Service setting: Virtual Visit (Telephone)  Attending: Gillis Santa, MD  Location: Telephone Encounter  Specialty: Interventional Pain Management  Patient type: Established   Pain Management Virtual Encounter Note - Virtual Visit via Telephone Telehealth (real-time audio visits between healthcare provider and patient).   Patient's Phone No.:  [phone] (home); [phone] (mobile); (Preferred) [phone] [email or web address]  [id number] [place], [place] - [id number]: [phone]: [phone]   Pre-screening note:  Our staff contacted [name] and offered him an "in person", "face-to-face" appointment versus a telephone encounter. He indicated preferring the telephone encounter, at this time.   Primary Reason(s) for Virtual Visit: Encounter for evaluation before starting new chronic pain management plan of care (Level of risk: moderate) COVID-19*  Social distancing based on CDC ans AMA recommendations.    I contacted WClinton Sawyeron 02/05/2019 via telephone.      I clearly identified myself as BGillis Santa MD. I verified that I was speaking with the correct person using two identifiers (Name: [name] and date of birth: [id number].  Advanced Informed Consent I sought verbal advanced consent from [name] virtual visit interactions. I informed [name] of possible security and privacy concerns, risks, and limitations associated with providing "not-in-person" medical evaluation and management services. I also informed [name] of the availability of "in-person" appointments. Finally, I informed him that there would be a charge for the virtual visit and that he could be  personally, fully or partially, financially  responsible for it. Mr. WTollisonexpressed understanding and agreed to proceed.   Historic Elements   Mr. John MACKENis a 58y.o. year old, male patient evaluated today after his last encounter by our practice on 12/19/2018. [name] has a past medical history of Cancer ([facility], CHF (congestive heart failure) ([place], COPD (chronic obstructive pulmonary disease) ([place], Coronary artery disease, Diabetes mellitus without complication ([place], Dyspnea, Fibromyalgia, History of kidney stones, MI ([place]ocardial infarction) ([place], Peripheral vascular disease ([place], and Pneumonia. He also  has a past surgical history that includes Cardiac catheterization (N/A, 06/09/2015); Cardiac catheterization (06/09/2015); Coronary artery bypass graft (2015); Cardiac catheterization; Application if wound vac; Wound debridement; stent in leg (Left); Endarterectomy (Left, 09/08/2017); and Lower Extremity Angiography (Right, 01/29/2019). Mr. WCoinerhas a current medication list which includes the following prescription(s): acetaminophen, albuterol, aspirin ec, carvedilol, clopidogrel, furosemide, glipizide, lisinopril, metformin, esbriet, pravastatin, sodium chloride, tamsulosin, tiotropium, victoza, hydrocodone-acetaminophen, and hydrocodone-acetaminophen. He  reports that he quit smoking about 5 years ago. His smoking use included cigarettes. He has never used smokeless tobacco. He reports previous alcohol use. He reports that he does not use drugs. Mr. WOlazabalhas No Known Allergies.   HPI  He is being evaluated for review of studies ordered on initial visit and to consider treatment plan options. Today I went over the results of his tests. These were explained in "Layman's terms". During today's appointment I went over my diagnostic impression, as well as the proposed treatment plan.  Patient has completed urine screen which was unremarkable.  He is also been evaluated by Dr. EAP PEN with psychiatry.  He was deemed low risk for opioid  misuse abuse.  Given that the patient has CHF, ischemic heart disease, history of cardiac surgery  complicated by staph infection, I do not recommend the patient come to the hospital to sign an opioid pain contract with our clinic which is usually customary when we are taken on new patients.  I have verbally discussed details of pain contract with the patient and he agreed.  We will sign at the next visit.  Right lower extremity angiography with stent placement on 01/29/2019.  He states that it was painful and that recovery has been painful.  He has been taking hydrocodone prescribed by his cardiologist to help manage his post procedure pain.  Controlled Substance Pharmacotherapy Assessment REMS (Risk Evaluation and Mitigation Strategy)   01/29/2019  2   01/29/2019  Hydrocodone-Acetamin 5-325 MG  50.00 6 Gr Sch   [id number]   Nor (4575)   0  41.67 MME  Medicare      Monitoring:  PMP: PDMP not reviewed this encounter.       Not applicable at this point since we have not taken over the patient's medication management yet. List of other Serum/Urine Drug Screening Test(s):  Lab Results  Component Value Date   COCAINSCRNUR NONE DETECTED 12/20/2018   THCU NONE DETECTED 12/20/2018   List of all UDS test(s) done:  No results found for: TOXASSSELUR, SUMMARY Last UDS on record: No results found for: TOXASSSELUR, SUMMARY UDS interpretation: No unexpected findings.          Medication Assessment Form: Patient introduced to form today verbally over phone Treatment compliance: Treatment may start today if patient agrees with proposed plan. Evaluation of compliance is not applicable at this point Risk Assessment Profile: Aberrant behavior: See initial evaluations. None observed or detected today Comorbid factors increasing risk of overdose: See initial evaluation. No additional risks detected today Opioid risk tool (ORT):  Opioid Risk  12/18/2018  Alcohol 0  Illegal Drugs 0  Rx Drugs 0  Alcohol 0   Illegal Drugs 0  Rx Drugs 0  Age between 16-45 years  0  History of Preadolescent Sexual Abuse 0  Psychological Disease 2  ADD Negative  OCD Negative  Bipolar Negative  Depression 1  Opioid Risk Tool Scoring 3  Opioid Risk Interpretation Low Risk    ORT Scoring interpretation table:  Score <3 = Low Risk for SUD  Score between 4-7 = Moderate Risk for SUD  Score >8 = High Risk for Opioid Abuse   Risk of substance use disorder (SUD): Low  Risk Mitigation Strategies:  Patient opioid safety counseling: Covered. Patient-Prescriber Agreement (PPA): verbally completed  Controlled substance notification to other providers: Written and sent today.  Pharmacologic Plan: Today we may be taking over the patient's pharmacological regimen. See below.             Meds   Current Outpatient Medications:  .  acetaminophen (TYLENOL) 500 MG tablet, Take 1,000-1,500 mg by mouth every 4 (four) hours as needed for moderate pain or headache. , Disp: , Rfl:  .  albuterol (PROVENTIL HFA;VENTOLIN HFA) 108 (90 Base) MCG/ACT inhaler, Inhale 2 puffs into the lungs every 6 (six) hours as needed for wheezing or shortness of breath., Disp: , Rfl:  .  aspirin EC 81 MG tablet, Take 81 mg by mouth daily., Disp: , Rfl:  .  carvedilol (COREG) 6.25 MG tablet, Take 6.25 mg by mouth 2 (two) times daily with a meal., Disp: , Rfl:  .  clopidogrel (PLAVIX) 75 MG tablet, Take 1 tablet (75 mg total) by mouth daily., Disp: 30 tablet, Rfl: 4 .  furosemide (  LASIX) 40 MG tablet, Take 40-80 mg by mouth See admin instructions. Take 80 mg in the morning and 40 mg in the evening, Disp: , Rfl:  .  glipiZIDE (GLUCOTROL XL) 10 MG 24 hr tablet, Take 10 mg by mouth 2 (two) times daily. , Disp: , Rfl:  .  lisinopril (ZESTRIL) 2.5 MG tablet, Take 2.5 mg by mouth daily., Disp: , Rfl:  .  metFORMIN (GLUCOPHAGE) 1000 MG tablet, Take 1,000 mg by mouth 2 (two) times daily with a meal., Disp: , Rfl:  .  Pirfenidone (ESBRIET) 267 MG CAPS, Take  801 mg by mouth 2 (two) times daily. , Disp: , Rfl:  .  pravastatin (PRAVACHOL) 40 MG tablet, Take 40 mg by mouth every other day. , Disp: , Rfl:  .  sodium chloride (AYR) 0.65 % nasal spray, Place 1 spray into the nose as needed (dryness)., Disp: , Rfl:  .  tamsulosin (FLOMAX) 0.4 MG CAPS capsule, Take 0.4 mg by mouth every evening. , Disp: , Rfl: 3 .  tiotropium (SPIRIVA) 18 MCG inhalation capsule, Place 18 mcg into inhaler and inhale daily., Disp: , Rfl:  .  VICTOZA 18 MG/3ML SOPN, Inject 0.6 mg into the skin daily. , Disp: , Rfl:  .  HYDROcodone-acetaminophen (NORCO) 7.5-325 MG tablet, Take 1 tablet by mouth 2 (two) times daily as needed for severe pain. Must last 30 days., Disp: 60 tablet, Rfl: 0 .  [START ON 03/07/2019] HYDROcodone-acetaminophen (NORCO) 7.5-325 MG tablet, Take 1 tablet by mouth 2 (two) times daily as needed for severe pain. Must last 30 days., Disp: 60 tablet, Rfl: 0  Laboratory Chemistry   SAFETY SCREENING Profile Lab Results  Component Value Date   SARSCOV2NAA NEGATIVE 01/25/2019   STAPHAUREUS NEGATIVE 07/31/2017   MRSAPCR NEGATIVE 09/08/2017   HIV Non Reactive 07/21/2018   Inflammation Markers (CRP: Acute Phase) (ESR: Chronic Phase) Lab Results  Component Value Date   LATICACIDVEN 1.4 09/09/2017                         Rheumatology Markers No results found for: RF, ANA, LABURIC, URICUR, LYMEIGGIGMAB, LYMEABIGMQN, HLAB27                      Renal Function Markers Lab Results  Component Value Date   BUN 21 (H) 01/29/2019   CREATININE 1.01 01/29/2019   GFRAA >60 01/29/2019   GFRNONAA >60 01/29/2019                             Hepatic Function Markers Lab Results  Component Value Date   AST 21 08/17/2017   ALT 20 08/17/2017   ALBUMIN 3.7 08/17/2017   ALKPHOS 44 08/17/2017                        Electrolytes Lab Results  Component Value Date   NA 137 07/23/2018   K 4.2 07/23/2018   CL 102 07/23/2018   CALCIUM 8.5 (L) 07/23/2018   MG 2.0  07/23/2018   PHOS 4.4 09/10/2017                        Neuropathy Markers Lab Results  Component Value Date   HGBA1C 7.8 (H) 07/21/2018   HIV Non Reactive 07/21/2018  CNS Tests No results found for: COLORCSF, APPEARCSF, RBCCOUNTCSF, WBCCSF, POLYSCSF, LYMPHSCSF, EOSCSF, PROTEINCSF, GLUCCSF, JCVIRUS, CSFOLI, IGGCSF, LABACHR, ACETBL                      Bone Pathology Markers No results found for: VD25OH, [id number], [id number], BW4665LD3, 25OHVITD1, 25OHVITD2, 25OHVITD3, TESTOFREE, TESTOSTERONE                       Coagulation Parameters Lab Results  Component Value Date   INR 1.07 09/09/2017   LABPROT 13.8 09/09/2017   APTT 28 09/08/2017   PLT 160 07/21/2018                        Cardiovascular Markers Lab Results  Component Value Date   BNP 158.0 (H) 07/20/2018   CKTOTAL 118 10/20/2013   CKMB 6.1 (H) 10/20/2013   TROPONINI 0.03 (HH) 07/20/2018   HGB 11.9 (L) 07/21/2018   HCT 39.0 07/21/2018                         ID Test(s) Lab Results  Component Value Date   HIV Non Reactive 07/21/2018   [place] NEGATIVE 01/25/2019   STAPHAUREUS NEGATIVE 07/31/2017   MRSAPCR NEGATIVE 09/08/2017   MICROTEXT  10/20/2013       COMMENT                   NO GROWTH AEROBICALLY/ANAEROBICALLY IN 5 DAYS   ANTIBIOTIC                                                        CA Markers No results found for: CEA, CA125, LABCA2                      Endocrine Markers No results found for: TSH, FREET4, TESTOFREE, TESTOSTERONE, SHBG, ESTRADIOL, ESTRADIOLPCT, ESTRADIOLFRE, LABPREG, ACTH                      Note: Lab results reviewed.  Assessment  The primary encounter diagnosis was Chronic pain syndrome. Diagnoses of Bilateral carotid artery stenosis, Chronic right shoulder pain, Coronary artery disease involving other coronary artery bypass graft, angina presence unspecified, Carotid stenosis, symptomatic John/o infarct, left, Congestive heart failure,  unspecified HF chronicity, unspecified heart failure type ([place]), Type 2 diabetes mellitus with peripheral neuropathy ([place]), and S/P CABG (coronary artery bypass graft) were also pertinent to this visit.  Plan of Care  I have discontinued Allyson Sabal HYDROcodone-acetaminophen. I am also having him start on HYDROcodone-acetaminophen and HYDROcodone-acetaminophen. Additionally, I am having him maintain his metFORMIN, glipiZIDE, pravastatin, tamsulosin, furosemide, carvedilol, aspirin EC, acetaminophen, tiotropium, albuterol, Victoza, Esbriet, lisinopril, sodium chloride, and clopidogrel. Pharmacotherapy (Medications Ordered): Meds ordered this encounter  Medications  . HYDROcodone-acetaminophen (NORCO) 7.5-325 MG tablet    Sig: Take 1 tablet by mouth 2 (two) times daily as needed for severe pain. Must last 30 days.    Dispense:  60 tablet    Refill:  0    Chronic Pain. (STOP Act - Not applicable). Fill one day early if closed on scheduled refill date.  Marland Kitchen HYDROcodone-acetaminophen (NORCO) 7.5-325 MG tablet    Sig: Take 1 tablet by mouth 2 (two) times daily  as needed for severe pain. Must last 30 days.    Dispense:  60 tablet    Refill:  0    Chronic Pain. (STOP Act - Not applicable). Fill one day early if closed on scheduled refill date.    Pharmacological management options:  Opioid Analgesics: We'll take over management today. See above orders Membrane stabilizer: Options discussed, including a trial. Muscle relaxant: We have discussed the possibility of a trial NSAID: Will not be prescribed. On ASA/ PLAVIX, bleeding risk Other analgesic(s): To be determined at a later time       Total duration of non-face-to-face encounter: 88mnutes.  Follow-up plan:   Return in about 8 weeks (around 04/02/2019) for Medication Management.    Recent Visits Date Type Provider Dept  12/19/18 Office Visit LGillis Santa MD [facility]  Showing recent visits within past 90 days and  meeting all other requirements   Today's Visits Date Type Provider Dept  02/05/19 Office Visit LGillis Santa MD [facility]  Showing today's visits and meeting all other requirements   Future Appointments No visits were found meeting these conditions.  Showing future appointments within next 90 days and meeting all other requirements   Primary Care Physician: HTracie Harrier MD Location: Telephone Virtual Visit Note by: BGillis Santa MD Date: 02/05/2019; Time: 9:29 AM  Note: This dictation was prepared with Dragon dictation. Any transcriptional errors that may result from this process are unintentional.  Disclaimer:  * Given the special circumstances of the COVID-19 pandemic, the federal government has announced that the Office for Civil Rights (OCR) will exercise its enforcement discretion and will not impose penalties on physicians using telehealth in the event of noncompliance with regulatory requirements under the [place](HIPAA) in connection with the good faith provision of telehealth during the CLTJQZ-00national public health emergency. ([place]

## 2019-02-11 DIAGNOSIS — R0602 Shortness of breath: Secondary | ICD-10-CM | POA: Diagnosis not present

## 2019-02-11 DIAGNOSIS — J90 Pleural effusion, not elsewhere classified: Secondary | ICD-10-CM | POA: Diagnosis not present

## 2019-02-11 DIAGNOSIS — J8410 Pulmonary fibrosis, unspecified: Secondary | ICD-10-CM | POA: Diagnosis not present

## 2019-02-11 DIAGNOSIS — J449 Chronic obstructive pulmonary disease, unspecified: Secondary | ICD-10-CM | POA: Diagnosis not present

## 2019-02-18 DIAGNOSIS — J449 Chronic obstructive pulmonary disease, unspecified: Secondary | ICD-10-CM | POA: Diagnosis not present

## 2019-02-26 DIAGNOSIS — J449 Chronic obstructive pulmonary disease, unspecified: Secondary | ICD-10-CM | POA: Diagnosis not present

## 2019-02-26 DIAGNOSIS — J9601 Acute respiratory failure with hypoxia: Secondary | ICD-10-CM | POA: Diagnosis not present

## 2019-02-27 ENCOUNTER — Other Ambulatory Visit (INDEPENDENT_AMBULATORY_CARE_PROVIDER_SITE_OTHER): Payer: Self-pay | Admitting: Vascular Surgery

## 2019-02-27 DIAGNOSIS — I70223 Atherosclerosis of native arteries of extremities with rest pain, bilateral legs: Secondary | ICD-10-CM

## 2019-02-27 DIAGNOSIS — Z95820 Peripheral vascular angioplasty status with implants and grafts: Secondary | ICD-10-CM

## 2019-03-01 ENCOUNTER — Ambulatory Visit (INDEPENDENT_AMBULATORY_CARE_PROVIDER_SITE_OTHER): Payer: Medicare HMO | Admitting: Nurse Practitioner

## 2019-03-01 ENCOUNTER — Ambulatory Visit (INDEPENDENT_AMBULATORY_CARE_PROVIDER_SITE_OTHER): Payer: Medicare HMO

## 2019-03-01 ENCOUNTER — Encounter (INDEPENDENT_AMBULATORY_CARE_PROVIDER_SITE_OTHER): Payer: Self-pay | Admitting: Nurse Practitioner

## 2019-03-01 ENCOUNTER — Other Ambulatory Visit: Payer: Self-pay

## 2019-03-01 VITALS — BP 133/80 | HR 82 | Resp 18 | Wt 219.0 lb

## 2019-03-01 DIAGNOSIS — E1159 Type 2 diabetes mellitus with other circulatory complications: Secondary | ICD-10-CM | POA: Diagnosis not present

## 2019-03-01 DIAGNOSIS — Z95820 Peripheral vascular angioplasty status with implants and grafts: Secondary | ICD-10-CM | POA: Diagnosis not present

## 2019-03-01 DIAGNOSIS — I739 Peripheral vascular disease, unspecified: Secondary | ICD-10-CM

## 2019-03-01 DIAGNOSIS — M7989 Other specified soft tissue disorders: Secondary | ICD-10-CM | POA: Diagnosis not present

## 2019-03-01 DIAGNOSIS — M1991 Primary osteoarthritis, unspecified site: Secondary | ICD-10-CM | POA: Diagnosis not present

## 2019-03-01 DIAGNOSIS — I70223 Atherosclerosis of native arteries of extremities with rest pain, bilateral legs: Secondary | ICD-10-CM

## 2019-03-01 DIAGNOSIS — E785 Hyperlipidemia, unspecified: Secondary | ICD-10-CM | POA: Diagnosis not present

## 2019-03-10 ENCOUNTER — Encounter (INDEPENDENT_AMBULATORY_CARE_PROVIDER_SITE_OTHER): Payer: Self-pay | Admitting: Nurse Practitioner

## 2019-03-10 DIAGNOSIS — M7989 Other specified soft tissue disorders: Secondary | ICD-10-CM | POA: Insufficient documentation

## 2019-03-10 NOTE — Progress Notes (Signed)
SUBJECTIVE:  Patient ID: [name], male    DOB: 09/28/1960, 58 y.o.   MRN: [id number] Chief Complaint  Patient presents with  . Follow-up    [facility] 59month ABI    HPI  [name] is a 58 y.o. male The patient returns to the office for followup and review of the noninvasive studies. There have been no interval changes in lower extremity symptoms. No interval shortening of the patient's claudication distance or development of rest pain symptoms. No new ulcers or wounds have occurred since the last visit.  There have been no significant changes to the patient's overall health care.  The patient denies amaurosis fugax or recent TIA symptoms. There are no recent neurological changes noted. The patient denies history of DVT, PE or superficial thrombophlebitis. The patient denies recent episodes of angina or shortness of breath.   ABI Rt=1.24 and Lt=1.14  (previous ABI's Rt=1.03  and Lt=0.96) Duplex ultrasound of the right lower extremity has biphasic waveforms within the anterior tibial artery and triphasic within the posterior tibial artery.  The left lower extremity has biphasic waveforms within the anterior tibial artery and the posterior tibial has monophasic waveforms.  Despite improved noninvasive studies the patient still continues to complain of pain and throbbing and aching in his right lower extremity.  He also has some persistent reperfusion swelling.  Past Medical History:  Diagnosis Date  . Cancer ([place])    skin (scalp)  . CHF (congestive heart failure) ([place])   . COPD (chronic obstructive pulmonary disease) ([place])   . Coronary artery disease   . Diabetes mellitus without complication ([place])   . Dyspnea   . Fibromyalgia   . History of kidney stones    asymptomatic  . MI (myocardial infarction) ([place])   . Peripheral vascular disease ([place])   . Pneumonia     Past Surgical History:  Procedure Laterality Date  . APPLICATION OF WOUND VAC     chest wound s/p CABG  .  CARDIAC CATHETERIZATION    . CORONARY ARTERY BYPASS GRAFT  2015   3 vessels  . ENDARTERECTOMY Left 09/08/2017   Procedure: ENDARTERECTOMY CAROTID;  Surgeon: Katha Cabal, MD;  Location: [facility];  Service: Vascular;  Laterality: Left;  . LOWER EXTREMITY ANGIOGRAPHY Right 01/29/2019   Procedure: LOWER EXTREMITY ANGIOGRAPHY;  Surgeon: Katha Cabal, MD;  Location: [facility];  Service: Cardiovascular;  Laterality: Right;  . PERIPHERAL VASCULAR CATHETERIZATION N/A 06/09/2015   Procedure: Abdominal Aortogram w/Lower Extremity;  Surgeon: Katha Cabal, MD;  Location: [facility];  Service: Cardiovascular;  Laterality: N/A;  . PERIPHERAL VASCULAR CATHETERIZATION  06/09/2015   Procedure: Lower Extremity Intervention;  Surgeon: Katha Cabal, MD;  Location: [facility];  Service: Cardiovascular;;  . stent in leg Left   . WOUND DEBRIDEMENT     debridement of chest wound x 2 s/p CABG     Social History   Socioeconomic History  . Marital status: Married    Spouse name: jerri  . Number of children: 2  . Years of education: Not on file  . Highest education level: Some college, no degree  Occupational History  . Not on file  Social Needs  . Financial resource strain: Not hard at all  . Food insecurity    Worry: Never true    Inability: Never true  . Transportation needs    Medical: No    Non-medical: No  Tobacco Use  . Smoking status:  Former Smoker    Types: Cigarettes    Quit date: 10/20/2013    Years since quitting: 5.3  . Smokeless tobacco: Never Used  Substance and Sexual Activity  . Alcohol use: Not Currently    Comment: seldom  . Drug use: No  . Sexual activity: Not on file  Lifestyle  . Physical activity    Days per week: 5 days    Minutes per session: 30 min  . Stress: Very much  Relationships  . Social Herbalist on phone: Not on file    Gets together: Not on file    Attends religious service: Never    Active  member of club or organization: No    Attends meetings of clubs or organizations: Never    Relationship status: Married  . Intimate partner violence    Fear of current or ex partner: No    Emotionally abused: No    Physically abused: No    Forced sexual activity: No  Other Topics Concern  . Not on file  Social History Narrative  . Not on file    Family History  Problem Relation Age of Onset  . Heart attack Mother   . Cancer Father   . Mental illness Neg Hx     No Known Allergies   Review of Systems   Review of Systems: Negative Unless Checked Constitutional: [] Weight loss  [] Fever  [] Chills Cardiac: [] Chest pain   []  Atrial Fibrillation  [] Palpitations   [] Shortness of breath when laying flat   [] Shortness of breath with exertion. [] Shortness of breath at rest Vascular:  [] Pain in legs with walking   [] Pain in legs with standing [x] Pain in legs when laying flat   [] Claudication    [x] Pain in feet when laying flat    [] History of DVT   [] Phlebitis   [x] Swelling in legs   [] Varicose veins   [] Non-healing ulcers Pulmonary:   [] Uses home oxygen   [] Productive cough   [] Hemoptysis   [] Wheeze  [x] COPD   [] Asthma Neurologic:  [] Dizziness   [] Seizures  [] Blackouts [] History of stroke   [] History of TIA  [] Aphasia   [] Temporary Blindness   [] Weakness or numbness in arm   [] Weakness or numbness in leg Musculoskeletal:   [] Joint swelling   [] Joint pain   [] Low back pain  []  History of Knee Replacement [] Arthritis [] back Surgeries  []  Spinal Stenosis    Hematologic:  [] Easy bruising  [] Easy bleeding   [] Hypercoagulable state   [] Anemic Gastrointestinal:  [] Diarrhea   [] Vomiting  [] Gastroesophageal reflux/heartburn   [] Difficulty swallowing. [] Abdominal pain Genitourinary:  [] Chronic kidney disease   [] Difficult urination  [] Anuric   [] Blood in urine [] Frequent urination  [] Burning with urination   [] Hematuria Skin:  [] Rashes   [] Ulcers [] Wounds Psychological:  [] History of anxiety   []   History of major depression  []  Memory Difficulties      OBJECTIVE:   Physical Exam  BP 133/80 (BP Location: Right Arm)   Pulse 82   Resp 18   Wt 219 lb (99.3 kg)   BMI 33.79 kg/m   Gen: WD/WN, NAD Head: [place]/AT, No temporalis wasting.  Ear/Nose/Throat: Hearing grossly intact, nares w/o erythema or drainage Eyes: PER, EOMI, sclera nonicteric.  Neck: Supple, no masses.  No JVD.  Pulmonary:  Good air movement, no use of accessory muscles.  Cardiac: RRR Vascular: 2+  Edema rle Vessel Right Left  Radial Palpable Palpable  Dorsalis Pedis Palpable Palpable  Posterior Tibial Palpable Palpable  Gastrointestinal: soft, non-distended. No guarding/no peritoneal signs.  Musculoskeletal: M/S 5/5 throughout.  No deformity or atrophy.  Neurologic: Pain and light touch intact in extremities.  Symmetrical.  Speech is fluent. Motor exam as listed above. Psychiatric: Judgment intact, Mood & affect appropriate for pt's clinical situation. Dermatologic: No Venous rashes. No Ulcers Noted.  No changes consistent with cellulitis. Lymph : No Cervical lymphadenopathy, no lichenification or skin changes of chronic lymphedema.       ASSESSMENT AND PLAN:  1. PAD (peripheral artery disease) (HCC) Due to the patient's continued pain and swelling we will have the patient return in 6 weeks with noninvasive studies.  We will also have the patient return to check on the swelling in his right lower extremity. - VAS [place] ABI WITH/WO TBI; Future - VAS [place] LOWER EXTREMITY ARTERIAL DUPLEX; Future  2. Type 2 diabetes mellitus with other circulatory complication, without long-term current use of insulin (HCC) Continue hypoglycemic medications as already ordered, these medications have been reviewed and there are no changes at this time.  Hgb A1C to be monitored as already arranged by primary service   3. Hyperlipidemia, unspecified hyperlipidemia type Continue statin as ordered and reviewed, no changes at this  time  4. Right leg swelling Patient is advised to wear medical grade 1 compression stockings in order to reduce swelling.  We offered the patient follow-up today however at this time he does not wish to proceed with 1.  The patient should also elevate his lower extremities much as possible as well as exercise.  Current Outpatient Medications on File Prior to Visit  Medication Sig Dispense Refill  . acetaminophen (TYLENOL) 500 MG tablet Take 1,000-1,500 mg by mouth every 4 (four) hours as needed for moderate pain or headache.     . albuterol (PROVENTIL HFA;VENTOLIN HFA) 108 (90 Base) MCG/ACT inhaler Inhale 2 puffs into the lungs every 6 (six) hours as needed for wheezing or shortness of breath.    Marland Kitchen aspirin EC 81 MG tablet Take 81 mg by mouth daily.    . carvedilol (COREG) 6.25 MG tablet Take 6.25 mg by mouth 2 (two) times daily with a meal.    . clopidogrel (PLAVIX) 75 MG tablet Take 1 tablet (75 mg total) by mouth daily. 30 tablet 4  . furosemide (LASIX) 40 MG tablet Take 40-80 mg by mouth See admin instructions. Take 80 mg in the morning and 40 mg in the evening    . glipiZIDE (GLUCOTROL XL) 10 MG 24 hr tablet Take 10 mg by mouth 2 (two) times daily.     Marland Kitchen HYDROcodone-acetaminophen (NORCO) 7.5-325 MG tablet Take 1 tablet by mouth 2 (two) times daily as needed for severe pain. Must last 30 days. 60 tablet 0  . lisinopril (ZESTRIL) 2.5 MG tablet Take 2.5 mg by mouth daily.    . metFORMIN (GLUCOPHAGE) 1000 MG tablet Take 1,000 mg by mouth 2 (two) times daily with a meal.    . Pirfenidone (ESBRIET) 267 MG CAPS Take 801 mg by mouth 2 (two) times daily.     . pravastatin (PRAVACHOL) 40 MG tablet Take 40 mg by mouth every other day.     . sodium chloride (AYR) 0.65 % nasal spray Place 1 spray into the nose as needed (dryness).    . tamsulosin (FLOMAX) 0.4 MG CAPS capsule Take 0.4 mg by mouth every evening.   3  . tiotropium (SPIRIVA) 18 MCG inhalation capsule Place 18 mcg into inhaler and inhale  daily.    Marland Kitchen  VICTOZA 18 MG/3ML SOPN Inject 0.6 mg into the skin daily.      No current facility-administered medications on file prior to visit.     There are no Patient Instructions on file for this visit. No follow-ups on file.   Kris Hartmann, NP  This note was completed with Sales executive.  Any errors are purely unintentional.

## 2019-03-15 DIAGNOSIS — J449 Chronic obstructive pulmonary disease, unspecified: Secondary | ICD-10-CM | POA: Diagnosis not present

## 2019-03-21 DIAGNOSIS — J449 Chronic obstructive pulmonary disease, unspecified: Secondary | ICD-10-CM | POA: Diagnosis not present

## 2019-03-26 DIAGNOSIS — J439 Emphysema, unspecified: Secondary | ICD-10-CM | POA: Diagnosis not present

## 2019-03-26 DIAGNOSIS — I739 Peripheral vascular disease, unspecified: Secondary | ICD-10-CM | POA: Diagnosis not present

## 2019-03-26 DIAGNOSIS — M546 Pain in thoracic spine: Secondary | ICD-10-CM | POA: Diagnosis not present

## 2019-03-26 DIAGNOSIS — I429 Cardiomyopathy, unspecified: Secondary | ICD-10-CM | POA: Diagnosis not present

## 2019-03-26 DIAGNOSIS — I6522 Occlusion and stenosis of left carotid artery: Secondary | ICD-10-CM | POA: Diagnosis not present

## 2019-03-26 DIAGNOSIS — I519 Heart disease, unspecified: Secondary | ICD-10-CM | POA: Diagnosis not present

## 2019-03-26 DIAGNOSIS — I2510 Atherosclerotic heart disease of native coronary artery without angina pectoris: Secondary | ICD-10-CM | POA: Diagnosis not present

## 2019-03-28 ENCOUNTER — Encounter: Payer: Self-pay | Admitting: Student in an Organized Health Care Education/Training Program

## 2019-03-29 DIAGNOSIS — J9601 Acute respiratory failure with hypoxia: Secondary | ICD-10-CM | POA: Diagnosis not present

## 2019-03-29 DIAGNOSIS — J449 Chronic obstructive pulmonary disease, unspecified: Secondary | ICD-10-CM | POA: Diagnosis not present

## 2019-04-02 ENCOUNTER — Other Ambulatory Visit: Payer: Self-pay

## 2019-04-02 ENCOUNTER — Encounter: Payer: Self-pay | Admitting: Student in an Organized Health Care Education/Training Program

## 2019-04-02 ENCOUNTER — Ambulatory Visit
Payer: Medicare HMO | Attending: Student in an Organized Health Care Education/Training Program | Admitting: Student in an Organized Health Care Education/Training Program

## 2019-04-02 DIAGNOSIS — Z951 Presence of aortocoronary bypass graft: Secondary | ICD-10-CM

## 2019-04-02 DIAGNOSIS — I509 Heart failure, unspecified: Secondary | ICD-10-CM | POA: Diagnosis not present

## 2019-04-02 DIAGNOSIS — M25511 Pain in right shoulder: Secondary | ICD-10-CM

## 2019-04-02 DIAGNOSIS — G8929 Other chronic pain: Secondary | ICD-10-CM

## 2019-04-02 DIAGNOSIS — I25810 Atherosclerosis of coronary artery bypass graft(s) without angina pectoris: Secondary | ICD-10-CM

## 2019-04-02 DIAGNOSIS — E1142 Type 2 diabetes mellitus with diabetic polyneuropathy: Secondary | ICD-10-CM

## 2019-04-02 DIAGNOSIS — I6523 Occlusion and stenosis of bilateral carotid arteries: Secondary | ICD-10-CM

## 2019-04-02 DIAGNOSIS — G894 Chronic pain syndrome: Secondary | ICD-10-CM

## 2019-04-02 DIAGNOSIS — I6522 Occlusion and stenosis of left carotid artery: Secondary | ICD-10-CM | POA: Diagnosis not present

## 2019-04-02 DIAGNOSIS — M25512 Pain in left shoulder: Secondary | ICD-10-CM

## 2019-04-02 MED ORDER — HYDROCODONE-ACETAMINOPHEN 10-325 MG PO TABS: 1.0000 | ORAL_TABLET | Freq: Two times a day (BID) | ORAL | 0 refills | Status: DC | PRN

## 2019-04-02 NOTE — Progress Notes (Signed)
Pain Management Virtual Encounter Note - Virtual Visit via [place] (real-time audio visits between healthcare provider and patient).   Patient's Phone No. & Preferred Pharmacy:  [phone] (home); [phone] (mobile); (Preferred) [phone] [email or web address]  [id number] - [place], [place] - [address] [place] [address] Phone: [phone] Fax: [phone]    Pre-screening note:  Our staff contacted [name] and offered him an "in person", "face-to-face" appointment versus a telephone encounter. He indicated preferring the telephone encounter, at this time.   Reason for Virtual Visit: COVID-19*  Social distancing based on CDC and AMA recommendations.   I contacted [name] on 04/02/2019 via video conference.      I clearly identified myself as Gillis Santa, MD. I verified that I was speaking with the correct person using two identifiers (Name: [name], and date of birth: April 16, 1961).  Advanced Informed Consent I sought verbal advanced consent from [name] for virtual visit interactions. I informed [name] of possible security and privacy concerns, risks, and limitations associated with providing "not-in-person" medical evaluation and management services. I also informed [name] of the availability of "in-person" appointments. Finally, I informed him that there would be a charge for the virtual visit and that he could be  personally, fully or partially, financially responsible for it. [name] expressed understanding and agreed to proceed.   Historic Elements   [name] is a 58 y.o. year old, male patient evaluated today after his last encounter by our practice on 02/05/2019. [name]  has a past medical history of Cancer [facility]), CHF (congestive heart failure) ([place]), COPD (chronic obstructive pulmonary disease) ([place]), Coronary artery disease, Diabetes mellitus without complication ([place]),  Dyspnea, Fibromyalgia, History of kidney stones, MI (myocardial infarction) ([place]), Peripheral vascular disease ([place]), and Pneumonia. He also  has a past surgical history that includes Cardiac catheterization (N/A, 06/09/2015); Cardiac catheterization (06/09/2015); Coronary artery bypass graft (2015); Cardiac catheterization; Application if wound vac; Wound debridement; stent in leg (Left); Endarterectomy (Left, 09/08/2017); and Lower Extremity Angiography (Right, 01/29/2019). [name] has a current medication list which includes the following prescription(s): acetaminophen, albuterol, aspirin ec, carvedilol, diclofenac sodium, furosemide, glipizide, lisinopril, metformin, esbriet, pravastatin, sodium chloride, tamsulosin, tiotropium, victoza, hydrocodone-acetaminophen, and hydrocodone-acetaminophen. He  reports that he quit smoking about 5 years ago. His smoking use included cigarettes. He has never used smokeless tobacco. He reports previous alcohol use. He reports that he does not use drugs. [name] has No Known Allergies.   HPI  Today, he is being contacted for medication management.   Continue to have persistent bilateral knee and shoulder pain.  Patient is status post right lower extremity stent in his right leg for peripheral vascular disease.  He states that his cardiologist has taken him off the Plavix.  He states that he has an appointment with orthopedics tomorrow regarding his shoulder pain.  Given his persistent and worsening bilateral shoulder pain that is preventing him from sleeping at night, discussed temporary increase in hydrocodone from 7.5 mg twice daily as needed to 10 mg twice daily as needed.  I also informed the patient that we can offer him injections for his shoulder which could include intra-articular shoulder steroid injection versus a suprascapular nerve block.  It may be a good idea for the patient to see orthopedics however if injection therapies are recommended, I informed the  patient that he should have those performed at the pain clinic since he does have  an opioid contract and is currently a patient here.  Patient endorsed understanding.  Pharmacotherapy Assessment  Analgesic:  03/07/2019  2   02/05/2019  Hydrocodone-Acetamin 7.5-325  60.00 30 Bi Lat   [id number]   [place] ([id number])   0  15.00 MME  Medicare   [facility]    Monitoring: Pharmacotherapy: No side-effects or adverse reactions reported. [place] PMP: PDMP reviewed during this encounter.       Compliance: No problems identified. Effectiveness: Clinically acceptable. Plan: Refer to "POC".  UDS: No results found for: SUMMARY Laboratory Chemistry Profile (12 mo)  Renal: 01/29/2019: BUN 21; Creatinine, Ser 1.01  Lab Results  Component Value Date   GFRAA >60 01/29/2019   GFRNONAA >60 01/29/2019   Hepatic: No results found for requested labs within last 8760 hours. Lab Results  Component Value Date   AST 21 08/17/2017   ALT 20 08/17/2017   Other: No results found for requested labs within last 8760 hours. Note: Above Lab results reviewed.  Imaging  Last 90 days:  US Venous Img Lower Bilateral  Result Date: 01/30/2019 CLINICAL DATA:  Bilateral lower extremity pain and edema. Evaluate for DVT. EXAM: BILATERAL LOWER EXTREMITY VENOUS DOPPLER ULTRASOUND TECHNIQUE: Gray-scale sonography with graded compression, as well as color Doppler and duplex ultrasound were performed to evaluate the lower extremity deep venous systems from the level of the common femoral vein and including the common femoral, femoral, profunda femoral, popliteal and calf veins including the posterior tibial, peroneal and gastrocnemius veins when visible. The superficial great saphenous vein was also interrogated. Spectral Doppler was utilized to evaluate flow at rest and with distal augmentation maneuvers in the common femoral, femoral and popliteal veins. COMPARISON:  None. FINDINGS: Examination is degraded due to patient body habitus and poor sonographic  window. RIGHT LOWER EXTREMITY Common Femoral Vein: No evidence of thrombus. Normal compressibility, respiratory phasicity and response to augmentation. Saphenofemoral Junction: No evidence of thrombus. Normal compressibility and flow on color Doppler imaging. Profunda Femoral Vein: No evidence of thrombus. Normal compressibility and flow on color Doppler imaging. Femoral Vein: No evidence of thrombus. Normal compressibility, respiratory phasicity and response to augmentation. Popliteal Vein: No evidence of thrombus. Normal compressibility, respiratory phasicity and response to augmentation. Calf Veins: No evidence of thrombus. Normal compressibility and flow on color Doppler imaging. Superficial Great Saphenous Vein: No evidence of thrombus. Normal compressibility. Venous Reflux:  None. Other Findings:  None. LEFT LOWER EXTREMITY Common Femoral Vein: No evidence of thrombus. Normal compressibility, respiratory phasicity and response to augmentation. Saphenofemoral Junction: No evidence of thrombus. Normal compressibility and flow on color Doppler imaging. Profunda Femoral Vein: No evidence of thrombus. Normal compressibility and flow on color Doppler imaging. Femoral Vein: No evidence of thrombus. Normal compressibility, respiratory phasicity and response to augmentation. Popliteal Vein: No evidence of thrombus. Normal compressibility, respiratory phasicity and response to augmentation. Calf Veins: No evidence of thrombus. Normal compressibility and flow on color Doppler imaging. Superficial Great Saphenous Vein: No evidence of thrombus. Normal compressibility. Venous Reflux:  None. Other Findings: Note is made of an approximately 3.0 x 2.2 x 1.4 cm fluid collection with the left popliteal fossa compatible with a Baker's cyst. IMPRESSION: No evidence of DVT within either lower extremity. Electronically Signed   By: Sandi Mariscal M.D.   On: 01/30/2019 16:36   Vas [place] Burnard Bunting With/wo Tbi  Result Date: 03/07/2019 LOWER  EXTREMITY DOPPLER STUDY Indications: Peripheral artery disease. Other Factors: 01/29/2019 right atherectomy. sfa-pop stent and pta of eia.  Vascular Interventions: 05/14/13:  Right CIA & left EIA stents with right CFA                         PTAs;                         06/17/15: Right CFA PTA;. Performing Technologist: Concha Norway RVT  Examination Guidelines: A complete evaluation includes at minimum, Doppler waveform signals and systolic blood pressure reading at the level of bilateral brachial, anterior tibial, and posterior tibial arteries, when vessel segments are accessible. Bilateral testing is considered an integral part of a complete examination. Photoelectric Plethysmograph (PPG) waveforms and toe systolic pressure readings are included as required and additional duplex testing as needed. Limited examinations for reoccurring indications may be performed as noted.  ABI Findings: +--------+------------------+-----+---------+--------+ Right   Rt Pressure (mmHg)IndexWaveform Comment  +--------+------------------+-----+---------+--------+ Brachial100                                      +--------+------------------+-----+---------+--------+ ATA     124               1.24 biphasic          +--------+------------------+-----+---------+--------+ PTA     175               1.75 triphasic         +--------+------------------+-----+---------+--------+ +--------+------------------+-----+----------+-------+ Left    Lt Pressure (mmHg)IndexWaveform  Comment +--------+------------------+-----+----------+-------+ Brachial100                                      +--------+------------------+-----+----------+-------+ ATA     107               1.07 biphasic          +--------+------------------+-----+----------+-------+ PTA     114               1.14 monophasic        +--------+------------------+-----+----------+-------+ +-------+-----------+-----------+------------+------------+  ABI/TBIToday's ABIToday's TBIPrevious ABIPrevious TBI +-------+-----------+-----------+------------+------------+ Right  1.24                                           +-------+-----------+-----------+------------+------------+ Left   1.14                                           +-------+-----------+-----------+------------+------------+ Right ABIs appear increased compared to prior study on 01/29/2019. Right waveforms have improved suggestive of increased flow compared to previous study.  Summary: Right: Resting right ankle-brachial index is within normal range. No evidence of significant right lower extremity arterial disease. Left: Resting left ankle-brachial index is within normal range. No evidence of significant left lower extremity arterial disease.  *See table(s) above for measurements and observations.  Electronically signed by Hortencia Pilar MD on 03/07/2019 at 5:02:35 PM.    Final    Vas [place] Abi With/wo Tbi  Result Date: 01/03/2019 LOWER EXTREMITY DOPPLER STUDY Indications: Peripheral artery disease.  Vascular Interventions: 05/14/13: Right CIA & left EIA stents with right CFA  PTAs;                         06/17/15: Right CFA PTA;. Comparison Study: 05/03/2018 Performing Technologist: Concha Norway RVT  Examination Guidelines: A complete evaluation includes at minimum, Doppler waveform signals and systolic blood pressure reading at the level of bilateral brachial, anterior tibial, and posterior tibial arteries, when vessel segments are accessible. Bilateral testing is considered an integral part of a complete examination. Photoelectric Plethysmograph (PPG) waveforms and toe systolic pressure readings are included as required and additional duplex testing as needed. Limited examinations for reoccurring indications may be performed as noted.  ABI Findings: +---------+------------------+-----+----------+--------+ Right    Rt Pressure (mmHg)IndexWaveform   Comment  +---------+------------------+-----+----------+--------+ Brachial 106                                       +---------+------------------+-----+----------+--------+ ATA      92                0.80 monophasic         +---------+------------------+-----+----------+--------+ PTA      119               1.03 monophasic         +---------+------------------+-----+----------+--------+ Great Toe60                0.52 Normal             +---------+------------------+-----+----------+--------+ +---------+------------------+-----+----------+-------+ Left     Lt Pressure (mmHg)IndexWaveform  Comment +---------+------------------+-----+----------+-------+ Brachial 115                                      +---------+------------------+-----+----------+-------+ ATA      100               0.87 monophasic        +---------+------------------+-----+----------+-------+ PTA      110               0.96 biphasic          +---------+------------------+-----+----------+-------+ Great Toe69                0.60 Normal            +---------+------------------+-----+----------+-------+ +-------+-----------+-----------+------------+------------+ ABI/TBIToday's ABIToday's TBIPrevious ABIPrevious TBI +-------+-----------+-----------+------------+------------+ Right  1.03       .52        .74         .38          +-------+-----------+-----------+------------+------------+ Left   .96        .60        .84         .55          +-------+-----------+-----------+------------+------------+ Bilateral ABIs and TBIs appear increased compared to prior study on 05/03/2018.  Summary: Right: Resting right ankle-brachial index is within normal range. No evidence of significant right lower extremity arterial disease. The right toe-brachial index is abnormal. Left: Resting left ankle-brachial index is within normal range. No evidence of significant left lower extremity arterial  disease. The left toe-brachial index is abnormal.  *See table(s) above for measurements and observations.  Electronically signed by Hortencia Pilar MD on 01/03/2019 at 5:30:38 PM.    Final    Vas US Carotid  Result Date: 01/03/2019 Carotid Arterial Duplex Study Indications:  6 month f/u. Other Factors:     Left carotid endarterectomy on 09/08/17. Comparison Study:  05/03/2018 Performing Technologist: Concha Norway RVT  Examination Guidelines: A complete evaluation includes B-mode imaging, spectral Doppler, color Doppler, and power Doppler as needed of all accessible portions of each vessel. Bilateral testing is considered an integral part of a complete examination. Limited examinations for reoccurring indications may be performed as noted.  Right Carotid Findings: +----------+--------+--------+--------+----------------------+--------+           PSV cm/sEDV cm/sStenosisDescribe              Comments +----------+--------+--------+--------+----------------------+--------+ CCA Prox  64      19                                             +----------+--------+--------+--------+----------------------+--------+ CCA Mid   57      21                                             +----------+--------+--------+--------+----------------------+--------+ CCA Distal66      21                                             +----------+--------+--------+--------+----------------------+--------+ ICA Prox  138     41      40-59%  calcific and irregular         +----------+--------+--------+--------+----------------------+--------+ ICA Mid   136     41                                             +----------+--------+--------+--------+----------------------+--------+ ICA Distal90      37                                             +----------+--------+--------+--------+----------------------+--------+ ECA       227     5       >50%                                    +----------+--------+--------+--------+----------------------+--------+ +----------+--------+-------+----------------+-------------------+           PSV cm/sEDV cmsDescribe        Arm Pressure (mmHG) +----------+--------+-------+----------------+-------------------+ [id number]            Multiphasic, WNL                    +----------+--------+-------+----------------+-------------------+ +---------+--------+--+--------+---------+ VertebralPSV cm/s63EDV cm/sAntegrade +---------+--------+--+--------+---------+  Left Carotid Findings: +----------+--------+--------+--------+--------+--------+           PSV cm/sEDV cm/sStenosisDescribeComments +----------+--------+--------+--------+--------+--------+ CCA Prox  75      16                               +----------+--------+--------+--------+--------+--------+ CCA Mid   94      21                               +----------+--------+--------+--------+--------+--------+  CCA Distal71      21                               +----------+--------+--------+--------+--------+--------+ ICA Prox  53      12                               +----------+--------+--------+--------+--------+--------+ ICA Mid   77      22                               +----------+--------+--------+--------+--------+--------+ ICA Distal73      26                               +----------+--------+--------+--------+--------+--------+ ECA       231     8       >50%                     +----------+--------+--------+--------+--------+--------+ +----------+--------+--------+---------+-------------------+ SubclavianPSV cm/sEDV cm/sDescribe Arm Pressure (mmHG) +----------+--------+--------+---------+-------------------+           145             Turbulent                    +----------+--------+--------+---------+-------------------+ +---------+--------+--------+--------------+ VertebralPSV cm/sEDV cm/sNot identified  +---------+--------+--------+--------------+  Summary: Right Carotid: Velocities in the right ICA are consistent with a 40-59%                stenosis. The ECA appears >50% stenosed. Moderate heterogeneous                plaque in the bulb/proximal ICA/ECA. Left Carotid: There is no evidence of stenosis in the left ICA. The ECA appears               >50% stenosed. Patent ICA status post CEA with no evidence of               significant stenosis. Vertebrals:  Right vertebral artery demonstrates antegrade flow. Left vertebral              artery was not visualized. Subclavians: Left subclavian artery flow was disturbed. Normal flow hemodynamics              were seen in the right subclavian artery. *See table(s) above for measurements and observations.  Electronically signed by Hortencia Pilar MD on 01/03/2019 at 5:30:33 PM.    Final    Vas [place] Lower Extremity Arterial Duplex  Result Date: 01/28/2019 LOWER EXTREMITY ARTERIAL DUPLEX STUDY Indications: Peripheral artery disease.  Vascular Interventions: 05/14/13: Right CIA & left EIA stents with right CFA                         PTAs;                         06/17/15: Right CFA PTA;. Current ABI:            Rt = 1.03; Lt = .96 Comparison Study: none Performing Technologist: Concha Norway RVT  Examination Guidelines: A complete evaluation includes B-mode imaging, spectral Doppler, color Doppler, and power Doppler as needed of all accessible portions of each vessel. Bilateral testing is considered an integral  part of a complete examination. Limited examinations for reoccurring indications may be performed as noted.  +----------+--------+-----+--------+----------+--------+ RIGHT     PSV cm/sRatioStenosisWaveform  Comments +----------+--------+-----+--------+----------+--------+ CFA Mid   103                  biphasic           +----------+--------+-----+--------+----------+--------+ DFA       160                                      +----------+--------+-----+--------+----------+--------+ SFA Prox  32                   biphasic           +----------+--------+-----+--------+----------+--------+ SFA Mid                occluded                   +----------+--------+-----+--------+----------+--------+ SFA Distal             occluded                   +----------+--------+-----+--------+----------+--------+ POP Distal32                   monophasic         +----------+--------+-----+--------+----------+--------+ ATA Distal35                   monophasic         +----------+--------+-----+--------+----------+--------+ PTA Distal27                   monophasic         +----------+--------+-----+--------+----------+--------+  +----------+--------+-----+--------+----------+--------+ LEFT      PSV cm/sRatioStenosisWaveform  Comments +----------+--------+-----+--------+----------+--------+ CFA Mid   132                  biphasic           +----------+--------+-----+--------+----------+--------+ DFA       168                  monophasic         +----------+--------+-----+--------+----------+--------+ SFA Prox               occluded                   +----------+--------+-----+--------+----------+--------+ SFA Mid                occluded                   +----------+--------+-----+--------+----------+--------+ SFA Distal             occluded                   +----------+--------+-----+--------+----------+--------+ POP Distal111                  monophasic         +----------+--------+-----+--------+----------+--------+ ATA Distal24                   monophasic         +----------+--------+-----+--------+----------+--------+ PTA Distal42                   monophasic         +----------+--------+-----+--------+----------+--------+  Summary: Right: Right SFA occluded just past origin. Collateral feed to popliteal artery with monophasic flow distally.  Left:  Left SFA occluded entirely. Collateral feed to popliteal artery with monophasic flow distally.  See table(s) above for measurements and observations. Electronically signed by Hortencia Pilar MD on 01/28/2019 at 4:48:38 PM.    Final    Vas US Aorta/ivc/iliacs  Result Date: 01/28/2019 IVC/ILIAC STUDY Vascular Interventions: 05/14/13: Right CIA & left EIA stents with right CFA                         PTAs;                         06/17/15: Right CFA PTA;.  Performing Technologist: Concha Norway RVT  Examination Guidelines: A complete evaluation includes B-mode imaging, spectral Doppler, color Doppler, and power Doppler as needed of all accessible portions of each vessel. Bilateral testing is considered an integral part of a complete examination. Limited examinations for reoccurring indications may be performed as noted.  Abdominal Aorta Findings: +-------------+-------+----------+----------+----------+--------+--------+ Location     AP (cm)Trans (cm)PSV (cm/s)Waveform  ThrombusComments +-------------+-------+----------+----------+----------+--------+--------+ Proximal                      106       monophasic                 +-------------+-------+----------+----------+----------+--------+--------+ Mid                           66        monophasic                 +-------------+-------+----------+----------+----------+--------+--------+ Distal                        73        monophasic                 +-------------+-------+----------+----------+----------+--------+--------+ RT CIA Prox                   219       biphasic                   +-------------+-------+----------+----------+----------+--------+--------+ RT CIA Distal                 405       biphasic                   +-------------+-------+----------+----------+----------+--------+--------+ RT EIA Prox                   312       biphasic                    +-------------+-------+----------+----------+----------+--------+--------+ RT EIA Mid                    99        monophasic                 +-------------+-------+----------+----------+----------+--------+--------+ RT EIA Distal                 82        monophasic                 +-------------+-------+----------+----------+----------+--------+--------+ LT CIA Prox                   140       monophasic                 +-------------+-------+----------+----------+----------+--------+--------+  LT CIA Distal                 346       monophasic                 +-------------+-------+----------+----------+----------+--------+--------+ LT EIA Prox                   383       monophasic                 +-------------+-------+----------+----------+----------+--------+--------+ LT EIA Mid                    178       monophasic                 +-------------+-------+----------+----------+----------+--------+--------+ LT EIA Distal                 176       monophasic                 +-------------+-------+----------+----------+----------+--------+--------+  Summary: IVC/Iliac: Moderate atherosclerosis throughout the abdominal aorta and bilateral iliac vessels. Vessels appear patent throughout suggesting patent stents bilaterally in the CIA's. In the distal CIA's bilaterally there are significantly increased velocities suggesting moderate stenosis.  *See table(s) above for measurements and observations.  Electronically signed by Hortencia Pilar MD on 01/28/2019 at 4:48:41 PM.   Final     Assessment  The primary encounter diagnosis was Chronic pain syndrome. Diagnoses of Bilateral carotid artery stenosis, Chronic right shoulder pain, Coronary artery disease involving other coronary artery bypass graft, angina presence unspecified, Carotid stenosis, symptomatic w/o infarct, left, Congestive heart failure, unspecified HF chronicity, unspecified heart failure type  ([place]), Type 2 diabetes mellitus with peripheral neuropathy ([place]), S/P CABG (coronary artery bypass graft), and Chronic pain of both shoulders were also pertinent to this visit.  Plan of Care  I have discontinued Allyson Sabal clopidogrel and HYDROcodone-acetaminophen. I am also having him start on HYDROcodone-acetaminophen and HYDROcodone-acetaminophen. Additionally, I am having him maintain his metFORMIN, glipiZIDE, pravastatin, tamsulosin, furosemide, carvedilol, aspirin EC, acetaminophen, tiotropium, albuterol, Victoza, Esbriet, lisinopril, sodium chloride, and diclofenac sodium.  -Consider shoulder injections after shoulder x-rays tomorrow: IA steroid vs suprascapular NB +/- pRFA  Pharmacotherapy (Medications Ordered): Meds ordered this encounter  Medications  . HYDROcodone-acetaminophen (NORCO) 10-325 MG tablet    Sig: Take 1 tablet by mouth 2 (two) times daily as needed for severe pain. Must last 30 days.    Dispense:  60 tablet    Refill:  0    Chronic Pain. (STOP Act - Not applicable). Fill one day early if closed on scheduled refill date.  Marland Kitchen HYDROcodone-acetaminophen (NORCO) 10-325 MG tablet    Sig: Take 1 tablet by mouth 2 (two) times daily as needed for severe pain. Must last 30 days.    Dispense:  60 tablet    Refill:  0    Chronic Pain. (STOP Act - Not applicable). Fill one day early if closed on scheduled refill date.   Follow-up plan:   Return in about 8 weeks (around 05/28/2019) for Medication Management.   Recent Visits Date Type Provider Dept  02/05/19 Office Visit Gillis Santa, MD [facility]  Showing recent visits within past 90 days and meeting all other requirements   Today's Visits Date Type Provider Dept  04/02/19 Office Visit Gillis Santa, MD [facility]  Showing today's visits and meeting all other requirements   Future Appointments No visits  were found meeting these conditions.  Showing future appointments within next 90 days and  meeting all other requirements   I discussed the assessment and treatment plan with the patient. The patient was provided an opportunity to ask questions and all were answered. The patient agreed with the plan and demonstrated an understanding of the instructions.  Patient advised to call back or seek an in-person evaluation if the symptoms or condition worsens.  Total duration of non-face-to-face encounter: 23minutes.  Note by: Gillis Santa, MD Date: 04/02/2019; Time: 1:45 PM  Note: This dictation was prepared with Dragon dictation. Any transcriptional errors that may result from this process are unintentional.  Disclaimer:  * Given the special circumstances of the COVID-19 pandemic, the federal government has announced that the Office for Civil Rights (OCR) will exercise its enforcement discretion and will not impose penalties on physicians using telehealth in the event of noncompliance with regulatory requirements under the [place] (HIPAA) in connection with the good faith provision of telehealth during the XX123456 national public health emergency. ([place])

## 2019-04-02 NOTE — Patient Instructions (Signed)
1.  Increase hydrocodone from 7.5 to 10 mg twice daily as needed prescription for 2 months Consider shoulder injections after shoulder x-rays tomorrow

## 2019-04-03 ENCOUNTER — Ambulatory Visit (HOSPITAL_BASED_OUTPATIENT_CLINIC_OR_DEPARTMENT_OTHER): Payer: Medicare HMO | Admitting: Student in an Organized Health Care Education/Training Program

## 2019-04-03 ENCOUNTER — Encounter: Payer: Self-pay | Admitting: Student in an Organized Health Care Education/Training Program

## 2019-04-03 ENCOUNTER — Telehealth: Payer: Self-pay | Admitting: Student in an Organized Health Care Education/Training Program

## 2019-04-03 ENCOUNTER — Ambulatory Visit
Admission: RE | Admit: 2019-04-03 | Discharge: 2019-04-03 | Disposition: A | Discharge status: Home / Self Care | Payer: Medicare HMO | Source: Ambulatory Visit | Attending: Student in an Organized Health Care Education/Training Program | Admitting: Student in an Organized Health Care Education/Training Program

## 2019-04-03 ENCOUNTER — Other Ambulatory Visit: Payer: Self-pay

## 2019-04-03 VITALS — BP 142/100 | HR 77 | Temp 98.0°F | Resp 16 | Ht 67.0 in | Wt 205.0 lb

## 2019-04-03 DIAGNOSIS — M19011 Primary osteoarthritis, right shoulder: Secondary | ICD-10-CM | POA: Diagnosis not present

## 2019-04-03 DIAGNOSIS — G8929 Other chronic pain: Secondary | ICD-10-CM | POA: Diagnosis not present

## 2019-04-03 DIAGNOSIS — M25511 Pain in right shoulder: Secondary | ICD-10-CM | POA: Insufficient documentation

## 2019-04-03 MED ORDER — LIDOCAINE HCL 2 % IJ SOLN
INTRAMUSCULAR | Status: AC
  Filled 2019-04-03: qty 20

## 2019-04-03 MED ORDER — ROPIVACAINE HCL 2 MG/ML IJ SOLN
INTRAMUSCULAR | Status: AC
  Filled 2019-04-03: qty 10

## 2019-04-03 MED ORDER — DEXAMETHASONE SODIUM PHOSPHATE 10 MG/ML IJ SOLN
10.0000 mg | Freq: Once | INTRAMUSCULAR | Status: AC
  Administered 2019-04-03: 10:00:00 10 mg

## 2019-04-03 MED ORDER — LIDOCAINE HCL 2 % IJ SOLN
20.0000 mL | Freq: Once | INTRAMUSCULAR | Status: AC
  Administered 2019-04-03: 10:00:00 400 mg

## 2019-04-03 MED ORDER — IOHEXOL 180 MG/ML  SOLN: 10.0000 mL | Freq: Once | INTRAMUSCULAR | Status: DC

## 2019-04-03 MED ORDER — DEXAMETHASONE SODIUM PHOSPHATE 10 MG/ML IJ SOLN
INTRAMUSCULAR | Status: AC
  Filled 2019-04-03: qty 1

## 2019-04-03 MED ORDER — ROPIVACAINE HCL 2 MG/ML IJ SOLN: 1.0000 mL | Freq: Once | INTRAMUSCULAR | Status: DC

## 2019-04-03 NOTE — Telephone Encounter (Signed)
Patient came in to see if he could get shoulder injection and was approved to have one by dr Holley Raring

## 2019-04-03 NOTE — Patient Instructions (Signed)

## 2019-04-03 NOTE — Progress Notes (Signed)
Patient's Name: [name]  MRN: [id number]  Referring Provider: Tracie Harrier, MD  DOB: 01/12/1961  PCP: Tracie Harrier, MD  DOS: 04/03/2019  Note by: Gillis Santa, MD  Service setting: Ambulatory outpatient  Specialty: Interventional Pain Management  Patient type: Established  Location: [facility] (AMB) Pain Management Facility  Visit type: Interventional Procedure   Primary Reason for Visit: Interventional Pain Management Treatment. CC: Shoulder Pain (right)  Procedure:          Anesthesia, Analgesia, Anxiolysis:  Type: Diagnostic Glenohumeral Joint (shoulder) Injection #1  Primary Purpose: Diagnostic Region: Posterior Shoulder Area Level:  Shoulder Target Area: Glenohumeral Joint (shoulder) Approach: Posterior approach. Laterality: Right  Type: Local Anesthesia  Local Anesthetic: Lidocaine 1-2%  Position: Supine   Indications: 1. Primary osteoarthritis of right shoulder   2. Chronic right shoulder pain    Pain Score: Pre-procedure: 8 /10 Post-procedure: 6 /10   Pre-op Assessment:  [name] is a 58 y.o. (year old), male patient, seen today for interventional treatment. He  has a past surgical history that includes Cardiac catheterization (N/A, 06/09/2015); Cardiac catheterization (06/09/2015); Coronary artery bypass graft (2015); Cardiac catheterization; Application if wound vac; Wound debridement; stent in leg (Left); Endarterectomy (Left, 09/08/2017); and Lower Extremity Angiography (Right, 01/29/2019). [name] has a current medication list which includes the following prescription(s): acetaminophen, albuterol, aspirin ec, carvedilol, diclofenac sodium, furosemide, glipizide, hydrocodone-acetaminophen, hydrocodone-acetaminophen, lisinopril, metformin, esbriet, pravastatin, sodium chloride, tamsulosin, tiotropium, and victoza, and the following Facility-Administered Medications: iohexol and ropivacaine (pf) 2 mg/ml (0.2%). His primarily concern today is the Shoulder Pain (right)   Initial Vital Signs:  Pulse/HCG Rate: 77ECG Heart Rate: 94 Temp: 98 F (36.7 C) Resp: 16 BP: 118/73 SpO2: 99 %  BMI: Estimated body mass index is 32.11 kg/m as calculated from the following:   Height as of this encounter: 5\' 7"  (1.702 m).   Weight as of this encounter: 205 lb (93 kg).  Risk Assessment: Allergies: Reviewed. He has No Known Allergies.  Allergy Precautions: None required Coagulopathies: Reviewed. None identified.  Blood-thinner therapy: None at this time Active Infection(s): Reviewed. None identified. [name] is afebrile  Site Confirmation: [name] was asked to confirm the procedure and laterality before marking the site Procedure checklist: Completed Consent: Before the procedure and under the influence of no sedative(s), amnesic(s), or anxiolytics, the patient was informed of the treatment options, risks and possible complications. To fulfill our ethical and legal obligations, as recommended by the American Medical Association's Code of Ethics, I have informed the patient of my clinical impression; the nature and purpose of the treatment or procedure; the risks, benefits, and possible complications of the intervention; the alternatives, including doing nothing; the risk(s) and benefit(s) of the alternative treatment(s) or procedure(s); and the risk(s) and benefit(s) of doing nothing. The patient was provided information about the general risks and possible complications associated with the procedure. These may include, but are not limited to: failure to achieve desired goals, infection, bleeding, organ or nerve damage, allergic reactions, paralysis, and death. In addition, the patient was informed of those risks and complications associated to the procedure, such as failure to decrease pain; infection; bleeding; organ or nerve damage with subsequent damage to sensory, motor, and/or autonomic systems, resulting in permanent pain, numbness, and/or weakness of one or  several areas of the body; allergic reactions; (i.e.: anaphylactic reaction); and/or death. Furthermore, the patient was informed of those risks and complications associated with the medications. These include, but are not limited to: allergic reactions (i.e.:  anaphylactic or anaphylactoid reaction(s)); adrenal axis suppression; blood sugar elevation that in diabetics may result in ketoacidosis or comma; water retention that in patients with history of congestive heart failure may result in shortness of breath, pulmonary edema, and decompensation with resultant heart failure; weight gain; swelling or edema; medication-induced neural toxicity; particulate matter embolism and blood vessel occlusion with resultant organ, and/or nervous system infarction; and/or aseptic necrosis of one or more joints. Finally, the patient was informed that Medicine is not an exact science; therefore, there is also the possibility of unforeseen or unpredictable risks and/or possible complications that may result in a catastrophic outcome. The patient indicated having understood very clearly. We have given the patient no guarantees and we have made no promises. Enough time was given to the patient to ask questions, all of which were answered to the patient's satisfaction. [name] has indicated that he wanted to continue with the procedure. Attestation: I, the ordering provider, attest that I have discussed with the patient the benefits, risks, side-effects, alternatives, likelihood of achieving goals, and potential problems during recovery for the procedure that I have provided informed consent. Date  Time: 04/03/2019  9:46 AM  Pre-Procedure Preparation:  Monitoring: As per clinic protocol. Respiration, ETCO2, SpO2, BP, heart rate and rhythm monitor placed and checked for adequate function Safety Precautions: Patient was assessed for positional comfort and pressure points before starting the procedure. Time-out: I initiated and  conducted the "Time-out" before starting the procedure, as per protocol. The patient was asked to participate by confirming the accuracy of the "Time Out" information. Verification of the correct person, site, and procedure were performed and confirmed by me, the nursing staff, and the patient. "Time-out" conducted as per Joint Commission's Universal Protocol (UP.01.01.01). Time: 1008  Description of Procedure:          Area Prepped: Entire shoulder Area Prepping solution: DuraPrep (Iodine Povacrylex [0.7% available iodine] and Isopropyl Alcohol, 74% w/w) Safety Precautions: Aspiration looking for blood return was conducted prior to all injections. At no point did we inject any substances, as a needle was being advanced. No attempts were made at seeking any paresthesias. Safe injection practices and needle disposal techniques used. Medications properly checked for expiration dates. SDV (single dose vial) medications used. Description of the Procedure: Protocol guidelines were followed. The patient was placed in position over the procedure table. The target area was identified and the area prepped in the usual manner. Skin & deeper tissues infiltrated with local anesthetic. Appropriate amount of time allowed to pass for local anesthetics to take effect. The procedure needles were then advanced to the target area. Proper needle placement secured. Negative aspiration confirmed. Solution injected in intermittent fashion, asking for systemic symptoms every 0.5cc of injectate. The needles were then removed and the area cleansed, making sure to leave some of the prepping solution back to take advantage of its long term bactericidal properties.    Vitals:   04/03/19 0949 04/03/19 1011  BP: 118/73 (!) 142/100  Pulse: 77   Resp: 16 16  Temp: 98 F (36.7 C)   SpO2: 99% 100%  Weight: 205 lb (93 kg)   Height: 5\' 7"  (1.702 m)     Start Time: 1008 hrs. End Time: 1014 hrs. Materials:  Needle(s) Type: Spinal  Needle Gauge: 25G Length: 3.5-in Medication(s): Please see orders for medications and dosing details. 3 cc solution made of 2 cc of 0.2% Ropivacaine, 1 cc of Decadron 10mg /cc  Imaging Guidance (Non-Spinal):  Type of Imaging Technique: Fluoroscopy Guidance (Non-Spinal) Indication(s): Assistance in needle guidance and placement for procedures requiring needle placement in or near specific anatomical locations not easily accessible without such assistance. Exposure Time: Please see nurses notes. Contrast: Before injecting any contrast, we confirmed that the patient did not have an allergy to iodine, shellfish, or radiological contrast. Once satisfactory needle placement was completed at the desired level, radiological contrast was injected. Contrast injected under live fluoroscopy. No contrast complications. See chart for type and volume of contrast used. Fluoroscopic Guidance: I was personally present during the use of fluoroscopy. "Tunnel Vision Technique" used to obtain the best possible view of the target area. Parallax error corrected before commencing the procedure. "Direction-depth-direction" technique used to introduce the needle under continuous pulsed fluoroscopy. Once target was reached, antero-posterior, oblique, and lateral fluoroscopic projection used confirm needle placement in all planes. Images permanently stored in EMR. Interpretation: I personally interpreted the imaging intraoperatively. Adequate needle placement confirmed in multiple planes. Appropriate spread of contrast into desired area was observed. No evidence of afferent or efferent intravascular uptake. Permanent images saved into the patient's record.  Antibiotic Prophylaxis:   Anti-infectives (From admission, onward)   None     Indication(s): None identified  Post-operative Assessment:  Post-procedure Vital Signs:  Pulse/HCG Rate: 7794 Temp: 98 F (36.7 C) Resp: 16 BP: (!) 142/100 SpO2: 100 %  EBL:  None  Complications: No immediate post-treatment complications observed by team, or reported by patient.  Note: The patient tolerated the entire procedure well. A repeat set of vitals were taken after the procedure and the patient was kept under observation following institutional policy, for this type of procedure. Post-procedural neurological assessment was performed, showing return to baseline, prior to discharge. The patient was provided with post-procedure discharge instructions, including a section on how to identify potential problems. Should any problems arise concerning this procedure, the patient was given instructions to immediately contact us, at any time, without hesitation. In any case, we plan to contact the patient by telephone for a follow-up status report regarding this interventional procedure.  Comments:  No additional relevant information.  Plan of Care  Orders:  Orders Placed This Encounter  Procedures  . SHOULDER INJECTION    Scheduling Instructions:     Side: RIGHT     Sedation: Without Sedation.     Timeframe: Today    Order Specific Question:   Where will this procedure be performed?    Answer:   [facility]    Comments:   Dr Holley Raring  . [facility] C-ARM 1-60 MIN NO REPORT    Intraoperative interpretation by procedural physician at [place].    Standing Status:   Standing    Number of Occurrences:   1    Order Specific Question:   Reason for exam:    Answer:   Assistance in needle guidance and placement for procedures requiring needle placement in or near specific anatomical locations not easily accessible without such assistance.   Medications ordered for procedure: Meds ordered this encounter  Medications  . iohexol (OMNIPAQUE) 180 MG/ML injection 10 mL    Must be Myelogram-compatible. If not available, you may substitute with a water-soluble, non-ionic, hypoallergenic, myelogram-compatible radiological contrast medium.  Marland Kitchen lidocaine  (XYLOCAINE) 2 % (with pres) injection 400 mg  . dexamethasone (DECADRON) injection 10 mg  . ropivacaine (PF) 2 mg/mL (0.2%) (NAROPIN) injection 1 mL   Medications administered: We administered lidocaine and dexamethasone.  See the medical record  for exact dosing, route, and time of administration.  Follow-up plan:   Return in about 4 weeks (around 05/01/2019) for Post Procedure Evaluation, virtual.      s/p R GH shoulder joint injection 9/9   Recent Visits Date Type Provider Dept  04/02/19 Office Visit Gillis Santa, MD [facility]  02/05/19 Office Visit Gillis Santa, MD [facility]  Showing recent visits within past 90 days and meeting all other requirements   Today's Visits Date Type Provider Dept  04/03/19 Procedure visit Gillis Santa, MD [facility]  Showing today's visits and meeting all other requirements   Future Appointments Date Type Provider Dept  05/02/19 Appointment Gillis Santa, MD [facility]  05/28/19 Appointment Gillis Santa, MD [facility]  Showing future appointments within next 90 days and meeting all other requirements   Disposition: Discharge home  Discharge Date & Time: 04/03/2019; 1020 hrs.   Primary Care Physician: Tracie Harrier, MD Location: [facility] Note by: Gillis Santa, MD Date: 04/03/2019; Time: 10:28 AM  Disclaimer:  Medicine is not an exact science. The only guarantee in medicine is that nothing is guaranteed. It is important to note that the decision to proceed with this intervention was based on the information collected from the patient. The Data and conclusions were drawn from the patient's questionnaire, the interview, and the physical examination. Because the information was provided in large part by the patient, it cannot be guaranteed that it has not been purposely or unconsciously manipulated. Every effort has been made to obtain as much relevant data as  possible for this evaluation. It is important to note that the conclusions that lead to this procedure are derived in large part from the available data. Always take into account that the treatment will also be dependent on availability of resources and existing treatment guidelines, considered by other Pain Management Practitioners as being common knowledge and practice, at the time of the intervention. For Medico-Legal purposes, it is also important to point out that variation in procedural techniques and pharmacological choices are the acceptable norm. The indications, contraindications, technique, and results of the above procedure should only be interpreted and judged by a Board-Certified Interventional Pain Specialist with extensive familiarity and expertise in the same exact procedure and technique.

## 2019-04-03 NOTE — Progress Notes (Signed)
Safety precautions to be maintained throughout the outpatient stay will include: orient to surroundings, keep bed in low position, maintain call bell within reach at all times, provide assistance with transfer out of bed and ambulation.  

## 2019-04-04 ENCOUNTER — Telehealth: Payer: Self-pay | Admitting: *Deleted

## 2019-04-04 NOTE — Telephone Encounter (Signed)
Doing well today. No post procedure issues.

## 2019-04-11 ENCOUNTER — Encounter (INDEPENDENT_AMBULATORY_CARE_PROVIDER_SITE_OTHER): Payer: Medicare HMO

## 2019-04-11 ENCOUNTER — Ambulatory Visit (INDEPENDENT_AMBULATORY_CARE_PROVIDER_SITE_OTHER): Payer: Medicare HMO | Admitting: Vascular Surgery

## 2019-04-15 ENCOUNTER — Encounter (INDEPENDENT_AMBULATORY_CARE_PROVIDER_SITE_OTHER): Payer: Medicare HMO

## 2019-04-15 ENCOUNTER — Ambulatory Visit (INDEPENDENT_AMBULATORY_CARE_PROVIDER_SITE_OTHER): Payer: Medicare HMO | Admitting: Vascular Surgery

## 2019-04-21 DIAGNOSIS — J449 Chronic obstructive pulmonary disease, unspecified: Secondary | ICD-10-CM | POA: Diagnosis not present

## 2019-04-28 DIAGNOSIS — J449 Chronic obstructive pulmonary disease, unspecified: Secondary | ICD-10-CM | POA: Diagnosis not present

## 2019-04-28 DIAGNOSIS — J9601 Acute respiratory failure with hypoxia: Secondary | ICD-10-CM | POA: Diagnosis not present

## 2019-05-01 ENCOUNTER — Encounter: Payer: Self-pay | Admitting: Student in an Organized Health Care Education/Training Program

## 2019-05-02 ENCOUNTER — Ambulatory Visit
Payer: Medicare HMO | Attending: Student in an Organized Health Care Education/Training Program | Admitting: Student in an Organized Health Care Education/Training Program

## 2019-05-02 ENCOUNTER — Encounter (INDEPENDENT_AMBULATORY_CARE_PROVIDER_SITE_OTHER): Payer: Medicare HMO

## 2019-05-02 ENCOUNTER — Ambulatory Visit (INDEPENDENT_AMBULATORY_CARE_PROVIDER_SITE_OTHER): Payer: Medicare HMO | Admitting: Vascular Surgery

## 2019-05-02 ENCOUNTER — Other Ambulatory Visit: Payer: Self-pay

## 2019-05-02 ENCOUNTER — Encounter: Payer: Self-pay | Admitting: Student in an Organized Health Care Education/Training Program

## 2019-05-02 DIAGNOSIS — I509 Heart failure, unspecified: Secondary | ICD-10-CM

## 2019-05-02 DIAGNOSIS — I25810 Atherosclerosis of coronary artery bypass graft(s) without angina pectoris: Secondary | ICD-10-CM | POA: Diagnosis not present

## 2019-05-02 DIAGNOSIS — G894 Chronic pain syndrome: Secondary | ICD-10-CM | POA: Diagnosis not present

## 2019-05-02 DIAGNOSIS — M19011 Primary osteoarthritis, right shoulder: Secondary | ICD-10-CM

## 2019-05-02 DIAGNOSIS — M25512 Pain in left shoulder: Secondary | ICD-10-CM

## 2019-05-02 DIAGNOSIS — G8929 Other chronic pain: Secondary | ICD-10-CM

## 2019-05-02 DIAGNOSIS — M25511 Pain in right shoulder: Secondary | ICD-10-CM | POA: Diagnosis not present

## 2019-05-02 NOTE — Progress Notes (Signed)
Pain Management Virtual Encounter Note - Virtual Visit via [place] (real-time audio visits between healthcare provider and patient).   Patient's Phone No. & Preferred Pharmacy:  [phone] (home); [phone] (mobile); (Preferred) [phone] [email or web address]  [id number] - [place], [place] - [address] [place] [address] Phone: [phone] Fax: [phone]    Pre-screening note:  Our staff contacted [name] and offered him an "in person", "face-to-face" appointment versus a telephone encounter. He indicated preferring the telephone encounter, at this time.   Reason for Virtual Visit: COVID-19*  Social distancing based on CDC and AMA recommendations.   I contacted [name] on 05/02/2019 via video conference.      I clearly identified myself as Gillis Santa, MD. I verified that I was speaking with the correct person using two identifiers (Name: [name], and date of birth: [birth year]).  Advanced Informed Consent I sought verbal advanced consent from [name] for virtual visit interactions. I informed [name] of possible security and privacy concerns, risks, and limitations associated with providing "not-in-person" medical evaluation and management services. I also informed [name] of the availability of "in-person" appointments. Finally, I informed him that there would be a charge for the virtual visit and that he could be  personally, fully or partially, financially responsible for it. [name] expressed understanding and agreed to proceed.   Historic Elements   [name] is a 58 y.o. year old, male patient evaluated today after his last encounter by our practice on 04/04/2019. [name]  has a past medical history of Cancer [place]), CHF (congestive heart failure) ([place]), COPD (chronic obstructive pulmonary disease) ([place]), Coronary artery disease, Diabetes mellitus without complication ([place]),  Dyspnea, Fibromyalgia, History of kidney stones, MI (myocardial infarction) ([place]), Peripheral vascular disease ([place]), and Pneumonia. He also  has a past surgical history that includes Cardiac catheterization (N/A, 06/09/2015); Cardiac catheterization (06/09/2015); Coronary artery bypass graft (2015); Cardiac catheterization; Application if wound vac; Wound debridement; stent in leg (Left); Endarterectomy (Left, 09/08/2017); and Lower Extremity Angiography (Right, 01/29/2019). [name] has a current medication list which includes the following prescription(s): acetaminophen, albuterol, aspirin ec, carvedilol, furosemide, glipizide, hydrocodone-acetaminophen, hydrocodone-acetaminophen, lisinopril, metformin, esbriet, pravastatin, sodium chloride, tamsulosin, tiotropium, diclofenac sodium, and victoza. He  reports that he quit smoking about 5 years ago. His smoking use included cigarettes. He has never used smokeless tobacco. He reports previous alcohol use. He reports that he does not use drugs. [name] has No Known Allergies.   HPI  Today, he is being contacted for a post-procedure assessment.  Evaluation of last interventional procedure  04/03/2019 Procedure:  Type: Diagnostic Glenohumeral Joint (shoulder) Injection #1  Primary Purpose: Diagnostic Region: Posterior Shoulder Area Level:  Shoulder Target Area: Glenohumeral Joint (shoulder) Approach: Posterior approach. Laterality: Right  Sedation: Please see nurses note for DOS. When no sedatives are used, the analgesic levels obtained are directly associated to the effectiveness of the local anesthetics. However, when sedation is provided, the level of analgesia obtained during the initial 1 hour following the intervention, is believed to be the result of a combination of factors. These factors may include, but are not limited to: 1. The effectiveness of the local anesthetics used. 2. The effects of the analgesic(s) and/or anxiolytic(s) used. 3. The  degree of discomfort experienced by the patient at the time of the procedure. 4. The patients ability and reliability in recalling and recording the events. 5. The presence and influence of possible  secondary gains and/or psychosocial factors. Reported result: Relief experienced during the 1st hour after the procedure: 100%   (Ultra-Short Term Relief)            Interpretative annotation: Clinically appropriate result. Analgesia during this period is likely to be Local Anesthetic and/or IV Sedative (Analgesic/Anxiolytic) related.          Effects of local anesthetic: The analgesic effects attained during this period are directly associated to the localized infiltration of local anesthetics and therefore cary significant diagnostic value as to the etiological location, or anatomical origin, of the pain. Expected duration of relief is directly dependent on the pharmacodynamics of the local anesthetic used. Long-acting (4-6 hours) anesthetics used.  Reported result: Relief during the next 4 to 6 hour after the procedure: 100%   (Short-Term Relief)            Interpretative annotation: Clinically appropriate result. Analgesia during this period is likely to be Local Anesthetic-related.          Long-term benefit: Defined as the period of time past the expected duration of local anesthetics (1 hour for short-acting and 4-6 hours for long-acting). With the possible exception of prolonged sympathetic blockade from the local anesthetics, benefits during this period are typically attributed to, or associated with, other factors such as analgesic sensory neuropraxia, antiinflammatory effects, or beneficial biochemical changes provided by agents other than the local anesthetics.  Reported result: Extended relief following procedure: 100% for 24 hrs then return of pain but feels that he is still getting mild benefit   (Long-Term Relief)            Interpretative annotation: Clinically appropriate result. Good  relief. No permanent benefit expected. Inflammation plays a part in the etiology to the pain.          Laboratory Chemistry Profile (12 mo)  Renal: 01/29/2019: BUN 21; Creatinine, Ser 1.01  Lab Results  Component Value Date   GFRAA >60 01/29/2019   GFRNONAA >60 01/29/2019   Hepatic: No results found for requested labs within last 8760 hours. Lab Results  Component Value Date   AST 21 08/17/2017   ALT 20 08/17/2017   Other: No results found for requested labs within last 8760 hours. Note: Above Lab results reviewed.  Imaging  Last 90 days:  [facility] C-arm 1-60 Min No Report  Result Date: 04/03/2019 Fluoro was used, but no Radiologist interpretation will be provided. Please refer to "NOTES" tab for provider progress note.  Vas [place] Burnard Bunting With/wo Tbi  Result Date: 03/07/2019 LOWER EXTREMITY DOPPLER STUDY Indications: Peripheral artery disease. Other Factors: 01/29/2019 right atherectomy. sfa-pop stent and pta of eia.  Vascular Interventions: 05/14/13: Right CIA & left EIA stents with right CFA                         PTAs;                         06/17/15: Right CFA PTA;. Performing Technologist: Concha Norway RVT  Examination Guidelines: A complete evaluation includes at minimum, Doppler waveform signals and systolic blood pressure reading at the level of bilateral brachial, anterior tibial, and posterior tibial arteries, when vessel segments are accessible. Bilateral testing is considered an integral part of a complete examination. Photoelectric Plethysmograph (PPG) waveforms and toe systolic pressure readings are included as required and additional duplex testing as needed. Limited examinations for reoccurring indications may be performed as  noted.  ABI Findings: +--------+------------------+-----+---------+--------+ Right   Rt Pressure (mmHg)IndexWaveform Comment  +--------+------------------+-----+---------+--------+ Brachial100                                       +--------+------------------+-----+---------+--------+ ATA     124               1.24 biphasic          +--------+------------------+-----+---------+--------+ PTA     175               1.75 triphasic         +--------+------------------+-----+---------+--------+ +--------+------------------+-----+----------+-------+ Left    Lt Pressure (mmHg)IndexWaveform  Comment +--------+------------------+-----+----------+-------+ Brachial100                                      +--------+------------------+-----+----------+-------+ ATA     107               1.07 biphasic          +--------+------------------+-----+----------+-------+ PTA     114               1.14 monophasic        +--------+------------------+-----+----------+-------+ +-------+-----------+-----------+------------+------------+ ABI/TBIToday's ABIToday's TBIPrevious ABIPrevious TBI +-------+-----------+-----------+------------+------------+ Right  1.24                                           +-------+-----------+-----------+------------+------------+ Left   1.14                                           +-------+-----------+-----------+------------+------------+ Right ABIs appear increased compared to prior study on 01/29/2019. Right waveforms have improved suggestive of increased flow compared to previous study.  Summary: Right: Resting right ankle-brachial index is within normal range. No evidence of significant right lower extremity arterial disease. Left: Resting left ankle-brachial index is within normal range. No evidence of significant left lower extremity arterial disease.  *See table(s) above for measurements and observations.  Electronically signed by Hortencia Pilar MD on 03/07/2019 at 5:02:35 PM.    Final     Assessment  The primary encounter diagnosis was Primary osteoarthritis of right shoulder. Diagnoses of Chronic right shoulder pain, Chronic pain syndrome, Coronary artery disease involving  other coronary artery bypass graft, angina presence unspecified, Congestive heart failure, unspecified HF chronicity, unspecified heart failure type ([place]), and Chronic pain of both shoulders were also pertinent to this visit.  Plan of Care  I am having [name] maintain his metFORMIN, glipiZIDE, pravastatin, tamsulosin, furosemide, carvedilol, aspirin EC, acetaminophen, tiotropium, albuterol, Victoza, Esbriet, lisinopril, sodium chloride, diclofenac sodium, HYDROcodone-acetaminophen, and HYDROcodone-acetaminophen.  Orders:  Orders Placed This Encounter  Procedures  . SHOULDER INJECTION    For shoulder pain.    Standing Status:   Standing    Number of Occurrences:   1    Standing Expiration Date:   05/01/2020    Scheduling Instructions:     Side: RIGHT     Sedation: Patient's choice.     TIMEFRAME: PRN procedure. ([name] will call when needed.)    Order Specific Question:   Where will this procedure be performed?  Answer:   [facility]    Comments:   Sohaib Vereen   Follow-up plan:   Return for Keep sch. appt.     s/p R GH shoulder joint injection 9/9- repeat prn    Recent Visits Date Type Provider Dept  04/03/19 Procedure visit Gillis Santa, MD [facility]  04/02/19 Office Visit Gillis Santa, MD [facility]  02/05/19 Office Visit Gillis Santa, MD [facility]  Showing recent visits within past 90 days and meeting all other requirements   Today's Visits Date Type Provider Dept  05/02/19 Office Visit Gillis Santa, MD [facility]  Showing today's visits and meeting all other requirements   Future Appointments Date Type Provider Dept  05/28/19 Appointment Gillis Santa, MD [facility]  Showing future appointments within next 90 days and meeting all other requirements   I discussed the assessment and treatment plan with the patient. The patient was provided an opportunity to ask questions and all were answered. The  patient agreed with the plan and demonstrated an understanding of the instructions.  Patient advised to call back or seek an in-person evaluation if the symptoms or condition worsens.  Total duration of non-face-to-face encounter: 40minutes.  Note by: Gillis Santa, MD Date: 05/02/2019; Time: 9:44 AM  Note: This dictation was prepared with Dragon dictation. Any transcriptional errors that may result from this process are unintentional.  Disclaimer:  * Given the special circumstances of the COVID-19 pandemic, the federal government has announced that the Office for Civil Rights (OCR) will exercise its enforcement discretion and will not impose penalties on physicians using telehealth in the event of noncompliance with regulatory requirements under the [place] (HIPAA) in connection with the good faith provision of telehealth during the XX123456 national public health emergency. ([place])

## 2019-05-20 ENCOUNTER — Ambulatory Visit (INDEPENDENT_AMBULATORY_CARE_PROVIDER_SITE_OTHER): Payer: Medicare HMO

## 2019-05-20 ENCOUNTER — Ambulatory Visit (INDEPENDENT_AMBULATORY_CARE_PROVIDER_SITE_OTHER): Payer: Medicare HMO | Admitting: Vascular Surgery

## 2019-05-20 ENCOUNTER — Other Ambulatory Visit: Payer: Self-pay

## 2019-05-20 ENCOUNTER — Encounter (INDEPENDENT_AMBULATORY_CARE_PROVIDER_SITE_OTHER): Payer: Self-pay

## 2019-05-20 DIAGNOSIS — I25810 Atherosclerosis of coronary artery bypass graft(s) without angina pectoris: Secondary | ICD-10-CM

## 2019-05-20 DIAGNOSIS — I739 Peripheral vascular disease, unspecified: Secondary | ICD-10-CM | POA: Diagnosis not present

## 2019-05-20 DIAGNOSIS — E1159 Type 2 diabetes mellitus with other circulatory complications: Secondary | ICD-10-CM

## 2019-05-20 DIAGNOSIS — I6523 Occlusion and stenosis of bilateral carotid arteries: Secondary | ICD-10-CM

## 2019-05-20 DIAGNOSIS — J449 Chronic obstructive pulmonary disease, unspecified: Secondary | ICD-10-CM

## 2019-05-20 NOTE — Progress Notes (Signed)
    MRN : [id number]  [name] is a 58 y.o. (September 22, 1960) male who presents with chief complaint of No chief complaint on file. Marland Kitchen  History of Present Illness:   Patient left without being seen  No outpatient medications have been marked as taking for the 05/20/19 encounter (Appointment) with Delana Meyer, Dolores Lory, MD.    Past Medical History:  Diagnosis Date  . Cancer ([place])    skin (scalp)  . CHF (congestive heart failure) ([place])   . COPD (chronic obstructive pulmonary disease) ([place])   . Coronary artery disease   . Diabetes mellitus without complication ([place])   . Dyspnea   . Fibromyalgia   . History of kidney stones    asymptomatic  . MI (myocardial infarction) ([place])   . Peripheral vascular disease ([place])   . Pneumonia     Past Surgical History:  Procedure Laterality Date  . APPLICATION OF WOUND VAC     chest wound s/p CABG  . CARDIAC CATHETERIZATION    . CORONARY ARTERY BYPASS GRAFT  2015   3 vessels  . ENDARTERECTOMY Left 09/08/2017   Procedure: ENDARTERECTOMY CAROTID;  Surgeon: Katha Cabal, MD;  Location: [facility];  Service: Vascular;  Laterality: Left;  . LOWER EXTREMITY ANGIOGRAPHY Right 01/29/2019   Procedure: LOWER EXTREMITY ANGIOGRAPHY;  Surgeon: Katha Cabal, MD;  Location: [facility];  Service: Cardiovascular;  Laterality: Right;  . PERIPHERAL VASCULAR CATHETERIZATION N/A 06/09/2015   Procedure: Abdominal Aortogram w/Lower Extremity;  Surgeon: Katha Cabal, MD;  Location: [facility];  Service: Cardiovascular;  Laterality: N/A;  . PERIPHERAL VASCULAR CATHETERIZATION  06/09/2015   Procedure: Lower Extremity Intervention;  Surgeon: Katha Cabal, MD;  Location: [facility];  Service: Cardiovascular;;  . stent in leg Left   . WOUND DEBRIDEMENT     debridement of chest wound x 2 s/p CABG     Social History Social History   Tobacco Use  . Smoking status: Former Smoker    Types: Cigarettes    Quit date:  10/20/2013    Years since quitting: 5.5  . Smokeless tobacco: Never Used  Substance Use Topics  . Alcohol use: Not Currently    Comment: seldom  . Drug use: No    Family History Family History  Problem Relation Age of Onset  . Heart attack Mother   . Cancer Father   . Mental illness Neg Hx     No Known Allergies    Physical Examination Patient left without being seen  There were no vitals filed for this visit. There is no height or weight on file to calculate BMI.  Hortencia Pilar, MD  05/20/2019 3:22 PM

## 2019-05-21 DIAGNOSIS — J449 Chronic obstructive pulmonary disease, unspecified: Secondary | ICD-10-CM | POA: Diagnosis not present

## 2019-05-22 ENCOUNTER — Encounter (INDEPENDENT_AMBULATORY_CARE_PROVIDER_SITE_OTHER): Payer: Self-pay | Admitting: Vascular Surgery

## 2019-05-27 ENCOUNTER — Encounter: Payer: Self-pay | Admitting: Student in an Organized Health Care Education/Training Program

## 2019-05-28 ENCOUNTER — Other Ambulatory Visit: Payer: Self-pay

## 2019-05-28 ENCOUNTER — Encounter: Payer: Self-pay | Admitting: Student in an Organized Health Care Education/Training Program

## 2019-05-28 ENCOUNTER — Ambulatory Visit
Payer: Medicare HMO | Attending: Student in an Organized Health Care Education/Training Program | Admitting: Student in an Organized Health Care Education/Training Program

## 2019-05-28 DIAGNOSIS — M19011 Primary osteoarthritis, right shoulder: Secondary | ICD-10-CM

## 2019-05-28 DIAGNOSIS — Z951 Presence of aortocoronary bypass graft: Secondary | ICD-10-CM

## 2019-05-28 DIAGNOSIS — I6522 Occlusion and stenosis of left carotid artery: Secondary | ICD-10-CM | POA: Diagnosis not present

## 2019-05-28 DIAGNOSIS — E1142 Type 2 diabetes mellitus with diabetic polyneuropathy: Secondary | ICD-10-CM

## 2019-05-28 DIAGNOSIS — G894 Chronic pain syndrome: Secondary | ICD-10-CM | POA: Diagnosis not present

## 2019-05-28 DIAGNOSIS — I6523 Occlusion and stenosis of bilateral carotid arteries: Secondary | ICD-10-CM | POA: Diagnosis not present

## 2019-05-28 DIAGNOSIS — I509 Heart failure, unspecified: Secondary | ICD-10-CM | POA: Diagnosis not present

## 2019-05-28 DIAGNOSIS — I25810 Atherosclerosis of coronary artery bypass graft(s) without angina pectoris: Secondary | ICD-10-CM | POA: Diagnosis not present

## 2019-05-28 DIAGNOSIS — M25511 Pain in right shoulder: Secondary | ICD-10-CM | POA: Diagnosis not present

## 2019-05-28 DIAGNOSIS — G8929 Other chronic pain: Secondary | ICD-10-CM

## 2019-05-28 DIAGNOSIS — M25512 Pain in left shoulder: Secondary | ICD-10-CM

## 2019-05-28 MED ORDER — HYDROCODONE-ACETAMINOPHEN 10-325 MG PO TABS: 1.0000 | ORAL_TABLET | Freq: Two times a day (BID) | ORAL | 0 refills | Status: DC | PRN

## 2019-05-28 NOTE — Progress Notes (Signed)
Pain Management Virtual Encounter Note - Virtual Visit via [place] (real-time audio visits between healthcare provider and patient).   Patient's Phone No. & Preferred Pharmacy:  [phone] (home); [phone] (mobile); (Preferred) [phone] [email or web address]  [id number] - [place], [place] - [address] [place] [address] Phone: [phone] Fax: [phone]    Pre-screening note:  Our staff contacted [name] and offered him an "in person", "face-to-face" appointment versus a telephone encounter. He indicated preferring the telephone encounter, at this time.   Reason for Virtual Visit: COVID-19*  Social distancing based on CDC and AMA recommendations.   I contacted [name] on 05/28/2019 via video conference.      I clearly identified myself as Gillis Santa, MD. I verified that I was speaking with the correct person using two identifiers (Name: [name], and date of birth: [birth year]).  Advanced Informed Consent I sought verbal advanced consent from [name] for virtual visit interactions. I informed [name] of possible security and privacy concerns, risks, and limitations associated with providing "not-in-person" medical evaluation and management services. I also informed [name] of the availability of "in-person" appointments. Finally, I informed him that there would be a charge for the virtual visit and that he could be  personally, fully or partially, financially responsible for it. [name] expressed understanding and agreed to proceed.   Historic Elements   [name] is a 58 y.o. year old, male patient evaluated today after his last encounter by our practice on 05/02/2019. [name]  has a past medical history of Cancer [facility]), CHF (congestive heart failure) ([place]), COPD (chronic obstructive pulmonary disease) ([place]), Coronary artery disease, Diabetes mellitus without complication ([place]),  Dyspnea, Fibromyalgia, History of kidney stones, MI (myocardial infarction) ([place]), Peripheral vascular disease ([place]), and Pneumonia. He also  has a past surgical history that includes Cardiac catheterization (N/A, 06/09/2015); Cardiac catheterization (06/09/2015); Coronary artery bypass graft (2015); Cardiac catheterization; Application if wound vac; Wound debridement; stent in leg (Left); Endarterectomy (Left, 09/08/2017); and Lower Extremity Angiography (Right, 01/29/2019). [name] has a current medication list which includes the following prescription(s): acetaminophen, albuterol, aspirin ec, carvedilol, diclofenac sodium, furosemide, glipizide, hydrocodone-acetaminophen, hydrocodone-acetaminophen, hydrocodone-acetaminophen, lisinopril, metformin, metolazone, esbriet, pravastatin, sodium chloride, tamsulosin, tiotropium, and victoza. He  reports that he quit smoking about 5 years ago. His smoking use included cigarettes. He has never used smokeless tobacco. He reports previous alcohol use. He reports that he does not use drugs. [name] has No Known Allergies.   HPI  Today, he is being contacted for medication management.   No change in medical history since last visit.  Patient's pain is at baseline.  Patient continues multimodal pain regimen as prescribed.  States that it provides pain relief and improvement in functional status.  Continues to endorse right shoulder pain that is worse with right shoulder abduction.  Patient is status post right shoulder glenohumeral steroid injection in September.  He has a as needed order in place should he like to repeat that in the future.  Patient endorsed understanding.  Otherwise we will refill patient's hydrocodone as below.  Pharmacotherapy Assessment  Analgesic:  05/06/2019  2   04/02/2019  Hydrocodone-Acetamin 10-325 MG  60.00  30 Bi Lat   [id number]   Nor ([id number])   0  20.00 MME  Medicare   [place]     Monitoring: Pharmacotherapy: No side-effects or adverse  reactions reported. [place] PMP: PDMP reviewed during this encounter.  Compliance: No problems identified. Effectiveness: Clinically acceptable. Plan: Refer to "POC".  Status:  Final result Visible to patient:  Yes (MyChart) Next appt:  11/14/2019 at 02:00 PM in Vascular Surgery ([facility] VASC 1)  Ref Range & Units 60mo ago  Tricyclic, Ur Screen NONE DETECTED NONE DETECTED   Amphetamines, Ur Screen NONE DETECTED NONE DETECTED   MDMA (Ecstasy)Ur Screen NONE DETECTED NONE DETECTED   Cocaine Metabolite,Ur [place] NONE DETECTED NONE DETECTED   Opiate, Ur Screen NONE DETECTED NONE DETECTED   Phencyclidine (PCP) Ur S NONE DETECTED NONE DETECTED   Cannabinoid 50 Ng, Ur [place] NONE DETECTED NONE DETECTED   Barbiturates, Ur Screen NONE DETECTED NONE DETECTED   Benzodiazepine, Ur Scrn NONE DETECTED NONE DETECTED   Methadone Scn, Ur NONE DETECTED NONE DETECTED          Laboratory Chemistry Profile (12 mo)  Renal: 01/29/2019: BUN 21; Creatinine, Ser 1.01  Lab Results  Component Value Date   GFRAA >60 01/29/2019   GFRNONAA >60 01/29/2019   Hepatic: No results found for requested labs within last 8760 hours. Lab Results  Component Value Date   AST 21 08/17/2017   ALT 20 08/17/2017   Other: No results found for requested labs within last 8760 hours. Note: Above Lab results reviewed.  Imaging  Last 90 days:  Dg [facility] C-arm 1-60 Min No Report  Result Date: 04/03/2019 Fluoro was used, but no Radiologist interpretation will be provided. Please refer to "NOTES" tab for provider progress note.  Vas [place] Burnard Bunting With/wo Tbi  Result Date: 05/20/2019 LOWER EXTREMITY DOPPLER STUDY Indications: Peripheral artery disease. Other Factors: 01/29/2019 right atherectomy. sfa-pop stent and pta of eia.  Vascular Interventions: 05/14/13: Right CIA & left EIA stents with right CFA                         PTAs;                         06/17/15: Right CFA PTA;                         01/29/2019 PTA and stent right SFA and  popliteal artery.                         PTA right EIA. Comparison Study: 03/01/2019 Performing Technologist: Charlane Ferretti RT (R)(VS)  Examination Guidelines: A complete evaluation includes at minimum, Doppler waveform signals and systolic blood pressure reading at the level of bilateral brachial, anterior tibial, and posterior tibial arteries, when vessel segments are accessible. Bilateral testing is considered an integral part of a complete examination. Photoelectric Plethysmograph (PPG) waveforms and toe systolic pressure readings are included as required and additional duplex testing as needed. Limited examinations for reoccurring indications may be performed as noted.  ABI Findings: +---------+------------------+-----+---------+--------+ Right    Rt Pressure (mmHg)IndexWaveform Comment  +---------+------------------+-----+---------+--------+ Brachial 99                                       +---------+------------------+-----+---------+--------+ ATA      138               1.20 triphasic         +---------+------------------+-----+---------+--------+ PTA      179  1.56 triphasic         +---------+------------------+-----+---------+--------+ Great Toe119               1.03 Dampened          +---------+------------------+-----+---------+--------+ +---------+------------------+-----+----------+-------+ Left     Lt Pressure (mmHg)IndexWaveform  Comment +---------+------------------+-----+----------+-------+ Brachial 115                                      +---------+------------------+-----+----------+-------+ ATA      99                0.86 biphasic          +---------+------------------+-----+----------+-------+ PTA      101               0.88 monophasic        +---------+------------------+-----+----------+-------+ Great Toe85                0.74 Abnormal          +---------+------------------+-----+----------+-------+  +-------+-----------+-----------+------------+------------+ ABI/TBIToday's ABIToday's TBIPrevious ABIPrevious TBI +-------+-----------+-----------+------------+------------+ Right  1.56       1.03       1.24                     +-------+-----------+-----------+------------+------------+ Left   .88        .74        1.14                     +-------+-----------+-----------+------------+------------+ Right ABIs appear increased compared to prior study on 03/01/2019. Left ABIs appear decreased compared to prior study on 03/01/2019.  Summary: Right: Resting right ankle-brachial index indicates noncompressible right lower extremity arteries. The right toe-brachial index is normal. ABIs are unreliable. Although ankle brachial indices are within normal limits (0.95-1.29), arterial Doppler waveforms at the ankle suggest some component of arterial occlusive disease. Right ABI is non compressable possibly due to medial calcification. Left: Resting left ankle-brachial index indicates mild left lower extremity arterial disease. The left toe-brachial index is normal. Although ankle brachial indices are within normal limits (0.95-1.29), arterial Doppler waveforms at the ankle suggest some component of arterial occlusive disease.  *See table(s) above for measurements and observations.  Electronically signed by Hortencia Pilar MD on 05/20/2019 at 4:48:04 PM.    Final    Vas [place] Abi With/wo Tbi  Result Date: 03/07/2019 LOWER EXTREMITY DOPPLER STUDY Indications: Peripheral artery disease. Other Factors: 01/29/2019 right atherectomy. sfa-pop stent and pta of eia.  Vascular Interventions: 05/14/13: Right CIA & left EIA stents with right CFA                         PTAs;                         06/17/15: Right CFA PTA;. Performing Technologist: Concha Norway RVT  Examination Guidelines: A complete evaluation includes at minimum, Doppler waveform signals and systolic blood pressure reading at the level of bilateral  brachial, anterior tibial, and posterior tibial arteries, when vessel segments are accessible. Bilateral testing is considered an integral part of a complete examination. Photoelectric Plethysmograph (PPG) waveforms and toe systolic pressure readings are included as required and additional duplex testing as needed. Limited examinations for reoccurring indications may be performed as noted.  ABI Findings: +--------+------------------+-----+---------+--------+ Right   Rt Pressure (mmHg)IndexWaveform Comment  +--------+------------------+-----+---------+--------+  Brachial100                                      +--------+------------------+-----+---------+--------+ ATA     124               1.24 biphasic          +--------+------------------+-----+---------+--------+ PTA     175               1.75 triphasic         +--------+------------------+-----+---------+--------+ +--------+------------------+-----+----------+-------+ Left    Lt Pressure (mmHg)IndexWaveform  Comment +--------+------------------+-----+----------+-------+ Brachial100                                      +--------+------------------+-----+----------+-------+ ATA     107               1.07 biphasic          +--------+------------------+-----+----------+-------+ PTA     114               1.14 monophasic        +--------+------------------+-----+----------+-------+ +-------+-----------+-----------+------------+------------+ ABI/TBIToday's ABIToday's TBIPrevious ABIPrevious TBI +-------+-----------+-----------+------------+------------+ Right  1.24                                           +-------+-----------+-----------+------------+------------+ Left   1.14                                           +-------+-----------+-----------+------------+------------+ Right ABIs appear increased compared to prior study on 01/29/2019. Right waveforms have improved suggestive of increased flow  compared to previous study.  Summary: Right: Resting right ankle-brachial index is within normal range. No evidence of significant right lower extremity arterial disease. Left: Resting left ankle-brachial index is within normal range. No evidence of significant left lower extremity arterial disease.  *See table(s) above for measurements and observations.  Electronically signed by Hortencia Pilar MD on 03/07/2019 at 5:02:35 PM.    Final    Vas [place] Lower Extremity Arterial Duplex  Result Date: 05/20/2019 LOWER EXTREMITY ARTERIAL DUPLEX STUDY Indications: Peripheral artery disease.  Vascular Interventions: 05/14/13: Right CIA & left EIA stents with right CFA                         PTA;                         06/17/15: Right CFA PTA;                         01/29/19: Right SFA and popliteal artery stent with right                         EIA PTA;. Current ABI:            Right = 1.56 Lt = .88 Comparison Study: 03/01/2019 Performing Technologist: Charlane Ferretti RT (R)(VS)  Examination Guidelines: A complete evaluation includes B-mode imaging, spectral Doppler, color Doppler, and power Doppler as needed of all accessible portions of each vessel. Bilateral  testing is considered an integral part of a complete examination. Limited examinations for reoccurring indications may be performed as noted.  +-----------+--------+-----+---------------+---------+-------------------------+ RIGHT      PSV cm/sRatioStenosis       Waveform Comments                  +-----------+--------+-----+---------------+---------+-------------------------+ CFA Mid    215                         biphasic                           +-----------+--------+-----+---------------+---------+-------------------------+ DFA        214                         triphasic                          +-----------+--------+-----+---------------+---------+-------------------------+ SFA Prox   172                         biphasic Stent                      +-----------+--------+-----+---------------+---------+-------------------------+ SFA Mid    241          50-74% stenosisbiphasic Focal diameter change of                                                  stent from .60 to .18cm   +-----------+--------+-----+---------------+---------+-------------------------+ SFA Distal 121                         biphasic Stent                     +-----------+--------+-----+---------------+---------+-------------------------+ POP Prox   133                         biphasic Stent                     +-----------+--------+-----+---------------+---------+-------------------------+ POP Distal 103                         biphasic Stent                     +-----------+--------+-----+---------------+---------+-------------------------+ ATA Distal 104                                                            +-----------+--------+-----+---------------+---------+-------------------------+ PTA Distal 32                                                             +-----------+--------+-----+---------------+---------+-------------------------+ PERO Distal12                                                             +-----------+--------+-----+---------------+---------+-------------------------+  Right Stent(s): +--------------+---+---------------+--------+-----------------------+ Prox to Stent 215               biphasic                        +--------------+---+---------------+--------+-----------------------+ Proximal Stent172               biphasic                        +--------------+---+---------------+--------+-----------------------+ Mid Stent     24150-99% stenosisbiphasicDiametr change in stent +--------------+---+---------------+--------+-----------------------+ Distal Stent  121               biphasic                         +--------------+---+---------------+--------+-----------------------+  Summary: Right: 50-74% stenosis noted in the superficial femoral artery. Patent right stent with elevated velocity at the proximal/mid level due to diameter change in stent from .60cm to .18cm.  See table(s) above for measurements and observations. Electronically signed by Hortencia Pilar MD on 05/20/2019 at 4:48:07 PM.    Final     Assessment  The primary encounter diagnosis was Primary osteoarthritis of right shoulder. Diagnoses of Chronic right shoulder pain, Chronic pain syndrome, Coronary artery disease involving other coronary artery bypass graft, angina presence unspecified, Congestive heart failure, unspecified HF chronicity, unspecified heart failure type ([place]), Chronic pain of both shoulders, Carotid stenosis, symptomatic w/o infarct, left, Type 2 diabetes mellitus with peripheral neuropathy (HCC), S/P CABG (coronary artery bypass graft), and Bilateral carotid artery stenosis were also pertinent to this visit.  Plan of Care  I am having [name] start on HYDROcodone-acetaminophen and HYDROcodone-acetaminophen. I am also having him maintain his metFORMIN, glipiZIDE, pravastatin, tamsulosin, furosemide, carvedilol, aspirin EC, acetaminophen, tiotropium, albuterol, Victoza, Esbriet, lisinopril, sodium chloride, diclofenac sodium, metolazone, and HYDROcodone-acetaminophen.  Pharmacotherapy (Medications Ordered): Meds ordered this encounter  Medications  . HYDROcodone-acetaminophen (NORCO) 10-325 MG tablet    Sig: Take 1 tablet by mouth 2 (two) times daily as needed for severe pain. Must last 30 days.    Dispense:  60 tablet    Refill:  0    Chronic Pain. (STOP Act - Not applicable). Fill one day early if closed on scheduled refill date.  Marland Kitchen HYDROcodone-acetaminophen (NORCO) 10-325 MG tablet    Sig: Take 1 tablet by mouth 2 (two) times daily as needed for severe pain. Must last 30 days.    Dispense:  60 tablet     Refill:  0    Chronic Pain. (STOP Act - Not applicable). Fill one day early if closed on scheduled refill date.  Marland Kitchen HYDROcodone-acetaminophen (NORCO) 10-325 MG tablet    Sig: Take 1 tablet by mouth 2 (two) times daily as needed for severe pain. Must last 30 days.    Dispense:  60 tablet    Refill:  0    Chronic Pain. (STOP Act - Not applicable). Fill one day early if closed on scheduled refill date.  Follow-up plan:   Return in about 3 months (around 08/29/2019) for Medication Management, in person.     s/p R GH shoulder joint injection 9/9- repeat prn     Recent Visits Date Type Provider Dept  05/02/19 Office Visit Gillis Santa, MD [facility]  04/03/19 Procedure visit Gillis Santa, MD [facility]  04/02/19 Office Visit Gillis Santa, MD [facility]  Showing recent visits within past 90 days  and meeting all other requirements   Today's Visits Date Type Provider Dept  05/28/19 Office Visit Gillis Santa, MD [facility]  Showing today's visits and meeting all other requirements   Future Appointments No visits were found meeting these conditions.  Showing future appointments within next 90 days and meeting all other requirements   I discussed the assessment and treatment plan with the patient. The patient was provided an opportunity to ask questions and all were answered. The patient agreed with the plan and demonstrated an understanding of the instructions.  Patient advised to call back or seek an in-person evaluation if the symptoms or condition worsens.  Total duration of non-face-to-face encounter: 25 minutes.  Note by: Gillis Santa, MD Date: 05/28/2019; Time: 9:00 AM  Note: This dictation was prepared with Dragon dictation. Any transcriptional errors that may result from this process are unintentional.  Disclaimer:  * Given the special circumstances of the COVID-19 pandemic, the federal government has announced that the Office for Civil  Rights (OCR) will exercise its enforcement discretion and will not impose penalties on physicians using telehealth in the event of noncompliance with regulatory requirements under the [place] and [place] (HIPAA) in connection with the good faith provision of telehealth during the XX123456 national public health emergency. ([place])

## 2019-05-29 DIAGNOSIS — J449 Chronic obstructive pulmonary disease, unspecified: Secondary | ICD-10-CM | POA: Diagnosis not present

## 2019-05-29 DIAGNOSIS — J9601 Acute respiratory failure with hypoxia: Secondary | ICD-10-CM | POA: Diagnosis not present

## 2019-06-05 DIAGNOSIS — I5020 Unspecified systolic (congestive) heart failure: Secondary | ICD-10-CM | POA: Diagnosis not present

## 2019-06-05 DIAGNOSIS — D649 Anemia, unspecified: Secondary | ICD-10-CM | POA: Diagnosis not present

## 2019-06-05 DIAGNOSIS — E119 Type 2 diabetes mellitus without complications: Secondary | ICD-10-CM | POA: Diagnosis not present

## 2019-06-05 DIAGNOSIS — J9611 Chronic respiratory failure with hypoxia: Secondary | ICD-10-CM | POA: Diagnosis not present

## 2019-06-05 DIAGNOSIS — J449 Chronic obstructive pulmonary disease, unspecified: Secondary | ICD-10-CM | POA: Diagnosis not present

## 2019-06-05 DIAGNOSIS — E538 Deficiency of other specified B group vitamins: Secondary | ICD-10-CM | POA: Diagnosis not present

## 2019-06-05 DIAGNOSIS — R39198 Other difficulties with micturition: Secondary | ICD-10-CM | POA: Diagnosis not present

## 2019-06-05 DIAGNOSIS — Z794 Long term (current) use of insulin: Secondary | ICD-10-CM | POA: Diagnosis not present

## 2019-06-05 DIAGNOSIS — Z125 Encounter for screening for malignant neoplasm of prostate: Secondary | ICD-10-CM | POA: Diagnosis not present

## 2019-06-05 DIAGNOSIS — I739 Peripheral vascular disease, unspecified: Secondary | ICD-10-CM | POA: Diagnosis not present

## 2019-06-05 DIAGNOSIS — M19011 Primary osteoarthritis, right shoulder: Secondary | ICD-10-CM | POA: Diagnosis not present

## 2019-06-05 DIAGNOSIS — E1142 Type 2 diabetes mellitus with diabetic polyneuropathy: Secondary | ICD-10-CM | POA: Diagnosis not present

## 2019-06-05 DIAGNOSIS — I2510 Atherosclerotic heart disease of native coronary artery without angina pectoris: Secondary | ICD-10-CM | POA: Diagnosis not present

## 2019-06-05 DIAGNOSIS — J439 Emphysema, unspecified: Secondary | ICD-10-CM | POA: Diagnosis not present

## 2019-06-05 DIAGNOSIS — J84112 Idiopathic pulmonary fibrosis: Secondary | ICD-10-CM | POA: Diagnosis not present

## 2019-06-05 DIAGNOSIS — Z0000 Encounter for general adult medical examination without abnormal findings: Secondary | ICD-10-CM | POA: Diagnosis not present

## 2019-06-05 DIAGNOSIS — E1165 Type 2 diabetes mellitus with hyperglycemia: Secondary | ICD-10-CM | POA: Diagnosis not present

## 2019-06-05 DIAGNOSIS — J9610 Chronic respiratory failure, unspecified whether with hypoxia or hypercapnia: Secondary | ICD-10-CM | POA: Diagnosis not present

## 2019-06-05 DIAGNOSIS — Z23 Encounter for immunization: Secondary | ICD-10-CM | POA: Diagnosis not present

## 2019-06-13 ENCOUNTER — Other Ambulatory Visit
Admission: RE | Admit: 2019-06-13 | Discharge: 2019-06-13 | Disposition: A | Discharge status: Home / Self Care | Payer: Medicare HMO | Source: Ambulatory Visit | Attending: Specialist | Admitting: Specialist

## 2019-06-13 DIAGNOSIS — R0789 Other chest pain: Secondary | ICD-10-CM | POA: Insufficient documentation

## 2019-06-13 DIAGNOSIS — J849 Interstitial pulmonary disease, unspecified: Secondary | ICD-10-CM | POA: Diagnosis not present

## 2019-06-13 DIAGNOSIS — R0602 Shortness of breath: Secondary | ICD-10-CM | POA: Diagnosis not present

## 2019-06-13 DIAGNOSIS — Z9981 Dependence on supplemental oxygen: Secondary | ICD-10-CM | POA: Diagnosis not present

## 2019-06-13 LAB — FIBRIN DERIVATIVES D-DIMER (ARMC ONLY): Fibrin derivatives D-dimer (ARMC): 1260.52 ng/mL (FEU) — ABNORMAL HIGH (ref 0.00–499.00)

## 2019-06-14 ENCOUNTER — Other Ambulatory Visit: Payer: Self-pay

## 2019-06-14 ENCOUNTER — Ambulatory Visit
Admission: RE | Admit: 2019-06-14 | Discharge: 2019-06-14 | Disposition: A | Discharge status: Home / Self Care | Payer: Medicare HMO | Source: Ambulatory Visit | Attending: Specialist | Admitting: Specialist

## 2019-06-14 ENCOUNTER — Other Ambulatory Visit: Payer: Self-pay | Admitting: Specialist

## 2019-06-14 DIAGNOSIS — R0602 Shortness of breath: Secondary | ICD-10-CM

## 2019-06-14 DIAGNOSIS — R7989 Other specified abnormal findings of blood chemistry: Secondary | ICD-10-CM

## 2019-06-14 MED ORDER — IOHEXOL 350 MG/ML SOLN
75.0000 mL | Freq: Once | INTRAVENOUS | Status: AC | PRN
  Administered 2019-06-14: 15:00:00 75 mL via INTRAVENOUS

## 2019-06-21 DIAGNOSIS — J449 Chronic obstructive pulmonary disease, unspecified: Secondary | ICD-10-CM | POA: Diagnosis not present

## 2019-06-24 DIAGNOSIS — J449 Chronic obstructive pulmonary disease, unspecified: Secondary | ICD-10-CM | POA: Diagnosis not present

## 2019-06-25 DIAGNOSIS — I2510 Atherosclerotic heart disease of native coronary artery without angina pectoris: Secondary | ICD-10-CM | POA: Diagnosis not present

## 2019-06-25 DIAGNOSIS — I6522 Occlusion and stenosis of left carotid artery: Secondary | ICD-10-CM | POA: Diagnosis not present

## 2019-06-25 DIAGNOSIS — J439 Emphysema, unspecified: Secondary | ICD-10-CM | POA: Diagnosis not present

## 2019-06-25 DIAGNOSIS — I214 Non-ST elevation (NSTEMI) myocardial infarction: Secondary | ICD-10-CM | POA: Diagnosis not present

## 2019-06-25 DIAGNOSIS — E785 Hyperlipidemia, unspecified: Secondary | ICD-10-CM | POA: Diagnosis not present

## 2019-06-25 DIAGNOSIS — I5022 Chronic systolic (congestive) heart failure: Secondary | ICD-10-CM | POA: Diagnosis not present

## 2019-06-25 DIAGNOSIS — E1165 Type 2 diabetes mellitus with hyperglycemia: Secondary | ICD-10-CM | POA: Diagnosis not present

## 2019-06-25 DIAGNOSIS — I9789 Other postprocedural complications and disorders of the circulatory system, not elsewhere classified: Secondary | ICD-10-CM | POA: Diagnosis not present

## 2019-06-25 DIAGNOSIS — I739 Peripheral vascular disease, unspecified: Secondary | ICD-10-CM | POA: Diagnosis not present

## 2019-06-28 DIAGNOSIS — J449 Chronic obstructive pulmonary disease, unspecified: Secondary | ICD-10-CM | POA: Diagnosis not present

## 2019-06-28 DIAGNOSIS — J9601 Acute respiratory failure with hypoxia: Secondary | ICD-10-CM | POA: Diagnosis not present

## 2019-07-16 DIAGNOSIS — J84112 Idiopathic pulmonary fibrosis: Secondary | ICD-10-CM | POA: Diagnosis not present

## 2019-07-16 DIAGNOSIS — J9611 Chronic respiratory failure with hypoxia: Secondary | ICD-10-CM | POA: Diagnosis not present

## 2019-07-21 DIAGNOSIS — J449 Chronic obstructive pulmonary disease, unspecified: Secondary | ICD-10-CM | POA: Diagnosis not present

## 2019-07-29 DIAGNOSIS — J449 Chronic obstructive pulmonary disease, unspecified: Secondary | ICD-10-CM | POA: Diagnosis not present

## 2019-07-29 DIAGNOSIS — J9601 Acute respiratory failure with hypoxia: Secondary | ICD-10-CM | POA: Diagnosis not present

## 2019-08-21 DIAGNOSIS — J449 Chronic obstructive pulmonary disease, unspecified: Secondary | ICD-10-CM | POA: Diagnosis not present

## 2019-08-22 ENCOUNTER — Encounter: Payer: Self-pay | Admitting: Student in an Organized Health Care Education/Training Program

## 2019-08-22 ENCOUNTER — Telehealth: Payer: Self-pay | Admitting: *Deleted

## 2019-08-26 ENCOUNTER — Other Ambulatory Visit: Payer: Self-pay

## 2019-08-26 ENCOUNTER — Ambulatory Visit (HOSPITAL_BASED_OUTPATIENT_CLINIC_OR_DEPARTMENT_OTHER): Payer: Medicare HMO | Admitting: Student in an Organized Health Care Education/Training Program

## 2019-08-26 ENCOUNTER — Ambulatory Visit
Admission: RE | Admit: 2019-08-26 | Discharge: 2019-08-26 | Disposition: A | Discharge status: Home / Self Care | Payer: Medicare HMO | Source: Ambulatory Visit | Attending: Student in an Organized Health Care Education/Training Program | Admitting: Student in an Organized Health Care Education/Training Program

## 2019-08-26 ENCOUNTER — Encounter: Payer: Self-pay | Admitting: Student in an Organized Health Care Education/Training Program

## 2019-08-26 VITALS — BP 126/90 | HR 72 | Temp 97.2°F | Resp 16 | Ht 67.0 in | Wt 205.0 lb

## 2019-08-26 DIAGNOSIS — G894 Chronic pain syndrome: Secondary | ICD-10-CM | POA: Diagnosis not present

## 2019-08-26 DIAGNOSIS — M19011 Primary osteoarthritis, right shoulder: Secondary | ICD-10-CM

## 2019-08-26 DIAGNOSIS — M25511 Pain in right shoulder: Secondary | ICD-10-CM | POA: Insufficient documentation

## 2019-08-26 DIAGNOSIS — G8929 Other chronic pain: Secondary | ICD-10-CM

## 2019-08-26 MED ORDER — DEXAMETHASONE SODIUM PHOSPHATE 10 MG/ML IJ SOLN
INTRAMUSCULAR | Status: AC
  Filled 2019-08-26: qty 1

## 2019-08-26 MED ORDER — HYDROCODONE-ACETAMINOPHEN 10-325 MG PO TABS: 1.0000 | ORAL_TABLET | Freq: Two times a day (BID) | ORAL | 0 refills | Status: DC | PRN

## 2019-08-26 MED ORDER — ROPIVACAINE HCL 2 MG/ML IJ SOLN
INTRAMUSCULAR | Status: AC
  Filled 2019-08-26: qty 10

## 2019-08-26 MED ORDER — LIDOCAINE HCL 2 % IJ SOLN
20.0000 mL | Freq: Once | INTRAMUSCULAR | Status: AC
  Administered 2019-08-26: 09:00:00 400 mg

## 2019-08-26 MED ORDER — ROPIVACAINE HCL 2 MG/ML IJ SOLN
2.0000 mL | Freq: Once | INTRAMUSCULAR | Status: AC
  Administered 2019-08-26: 09:00:00 2 mL via EPIDURAL

## 2019-08-26 MED ORDER — LIDOCAINE HCL 2 % IJ SOLN
INTRAMUSCULAR | Status: AC
  Filled 2019-08-26: qty 20

## 2019-08-26 MED ORDER — DEXAMETHASONE SODIUM PHOSPHATE 10 MG/ML IJ SOLN
10.0000 mg | Freq: Once | INTRAMUSCULAR | Status: AC
  Administered 2019-08-26: 09:00:00 10 mg

## 2019-08-26 MED ORDER — IOHEXOL 180 MG/ML  SOLN
10.0000 mL | Freq: Once | INTRAMUSCULAR | Status: AC
  Administered 2019-08-26: 09:00:00 10 mL via EPIDURAL
  Filled 2019-08-26: qty 20

## 2019-08-26 NOTE — Progress Notes (Signed)
Safety precautions to be maintained throughout the outpatient stay will include: orient to surroundings, keep bed in low position, maintain call bell within reach at all times, provide assistance with transfer out of bed and ambulation.  

## 2019-08-26 NOTE — Patient Instructions (Signed)

## 2019-08-26 NOTE — Progress Notes (Signed)
PROVIDER NOTE: Information contained herein reflects review and annotations entered in association with encounter. Interpretation of such information and data should be left to medically-trained personnel. Information provided to patient can be located elsewhere in the medical record under "Patient Instructions". Document created using STT-dictation technology, any transcriptional errors that may result from process are unintentional.    Patient: [name]  Service Category: Procedure  Provider: Gillis Santa, MD  DOB: Jun 22, 1961  DOS: 08/26/2019  Location: [facility]  MRN: [id number]  Setting: Ambulatory - outpatient  Referring Provider: Tracie Harrier, MD  Type: Established Patient  Specialty: Interventional Pain Management  PCP: Tracie Harrier, MD   Primary Reason for Visit: Interventional Pain Management Treatment. CC: Shoulder Pain (right )  Procedure:          Anesthesia, Analgesia, Anxiolysis:  Type: Therapeutic Glenohumeral Joint (shoulder) Injection #2  Primary Purpose: Diagnostic/Therapeutric Region: Anterior Shoulder Area Level:  Shoulder Target Area: Glenohumeral Joint (shoulder) Approach: Anterior approach. Laterality: Right  Type: Local Anesthesia  Local Anesthetic: Lidocaine 1-2%  Position: Supine   Indications: 1. Primary osteoarthritis of right shoulder   2. Chronic right shoulder pain   3. Chronic pain syndrome    Pain Score: Pre-procedure: 6 /10 Post-procedure: 3 /10   Pre-op Assessment:  Mr. Kroeker is a 59 y.o. (year old), male patient, seen today for interventional treatment. He  has a past surgical history that includes Cardiac catheterization (N/A, 06/09/2015); Cardiac catheterization (06/09/2015); Coronary artery bypass graft (2015); Cardiac catheterization; Application if wound vac; Wound debridement; stent in leg (Left); Endarterectomy (Left, 09/08/2017); and Lower Extremity Angiography (Right, 01/29/2019). Mr. Chrystal has a current  medication list which includes the following prescription(s): acetaminophen, albuterol, aspirin ec, carvedilol, furosemide, [START ON 09/04/2019] hydrocodone-acetaminophen, [START ON 10/04/2019] hydrocodone-acetaminophen, [START ON 11/03/2019] hydrocodone-acetaminophen, lisinopril, metformin, metolazone, esbriet, pravastatin, sodium chloride, tamsulosin, diclofenac sodium, glipizide, tiotropium, and victoza. His primarily concern today is the Shoulder Pain (right )  Initial Vital Signs:  Pulse/HCG Rate: 79  Temp: (!) 97.2 F (36.2 C) Resp: 16 BP: 110/69 SpO2: 94 %  BMI: Estimated body mass index is 32.11 kg/m as calculated from the following:   Height as of this encounter: _0  (1.702 m).   Weight as of this encounter: 205 lb (93 kg).  Risk Assessment: Allergies: Reviewed. He has No Known Allergies.  Allergy Precautions: None required Coagulopathies: Reviewed. None identified.  Blood-thinner therapy: None at this time Active Infection(s): Reviewed. None identified. Mr. Hedgepath is afebrile  Site Confirmation: Mr. Busic was asked to confirm the procedure and laterality before marking the site Procedure checklist: Completed Consent: Before the procedure and under the influence of no sedative(s), amnesic(s), or anxiolytics, the patient was informed of the treatment options, risks and possible complications. To fulfill our ethical and legal obligations, as recommended by the American Medical Association's Code of Ethics, I have informed the patient of my clinical impression; the nature and purpose of the treatment or procedure; the risks, benefits, and possible complications of the intervention; the alternatives, including doing nothing; the risk(s) and benefit(s) of the alternative treatment(s) or procedure(s); and the risk(s) and benefit(s) of doing nothing. The patient was provided information about the general risks and possible complications associated with the procedure. These may include, but  are not limited to: failure to achieve desired goals, infection, bleeding, organ or nerve damage, allergic reactions, paralysis, and death. In addition, the patient was informed of those risks and complications associated to the procedure, such as failure to decrease pain;  infection; bleeding; organ or nerve damage with subsequent damage to sensory, motor, and/or autonomic systems, resulting in permanent pain, numbness, and/or weakness of one or several areas of the body; allergic reactions; (i.e.: anaphylactic reaction); and/or death. Furthermore, the patient was informed of those risks and complications associated with the medications. These include, but are not limited to: allergic reactions (i.e.: anaphylactic or anaphylactoid reaction(s)); adrenal axis suppression; blood sugar elevation that in diabetics may result in ketoacidosis or comma; water retention that in patients with history of congestive heart failure may result in shortness of breath, pulmonary edema, and decompensation with resultant heart failure; weight gain; swelling or edema; medication-induced neural toxicity; particulate matter embolism and blood vessel occlusion with resultant organ, and/or nervous system infarction; and/or aseptic necrosis of one or more joints. Finally, the patient was informed that Medicine is not an exact science; therefore, there is also the possibility of unforeseen or unpredictable risks and/or possible complications that may result in a catastrophic outcome. The patient indicated having understood very clearly. We have given the patient no guarantees and we have made no promises. Enough time was given to the patient to ask questions, all of which were answered to the patient's satisfaction. Mr. Seipp has indicated that he wanted to continue with the procedure. Attestation: I, the ordering provider, attest that I have discussed with the patient the benefits, risks, side-effects, alternatives, likelihood of  achieving goals, and potential problems during recovery for the procedure that I have provided informed consent. Date  Time: 08/26/2019  8:47 AM  Pre-Procedure Preparation:  Monitoring: As per clinic protocol. Respiration, ETCO2, SpO2, BP, heart rate and rhythm monitor placed and checked for adequate function Safety Precautions: Patient was assessed for positional comfort and pressure points before starting the procedure. Time-out: I initiated and conducted the "Time-out" before starting the procedure, as per protocol. The patient was asked to participate by confirming the accuracy of the "Time Out" information. Verification of the correct person, site, and procedure were performed and confirmed by me, the nursing staff, and the patient. "Time-out" conducted as per Joint Commission's Universal Protocol (UP.01.01.01). Time: 0915  Description of Procedure:          Area Prepped: Entire shoulder Area Prepping solution: DuraPrep (Iodine Povacrylex [0.7% available iodine] and Isopropyl Alcohol, 74% w/w) Safety Precautions: Aspiration looking for blood return was conducted prior to all injections. At no point did we inject any substances, as a needle was being advanced. No attempts were made at seeking any paresthesias. Safe injection practices and needle disposal techniques used. Medications properly checked for expiration dates. SDV (single dose vial) medications used. Description of the Procedure: Protocol guidelines were followed. The patient was placed in position over the procedure table. The target area was identified and the area prepped in the usual manner. Skin & deeper tissues infiltrated with local anesthetic. Appropriate amount of time allowed to pass for local anesthetics to take effect. The procedure needles were then advanced to the target area. Proper needle placement secured. Negative aspiration confirmed. Solution injected in intermittent fashion, asking for systemic symptoms every 0.5cc of  injectate. The needles were then removed and the area cleansed, making sure to leave some of the prepping solution back to take advantage of its long term bactericidal properties.    Vitals:   08/26/19 0851 08/26/19 0910 08/26/19 0920  BP: 110/69 127/78 126/90  Pulse: 79 75 72  Resp: _0 Temp: (!) 97.2 F (36.2 C)    TempSrc: Temporal  SpO2: 94% 100% 99%  Weight: 205 lb (93 kg)    Height: _0  (1.702 m)      Start Time: 0915 hrs. End Time: 0919 hrs. Materials:  Needle(s) Type: Spinal Needle Gauge: 22G Length: 3.5-in Medication(s): Please see orders for medications and dosing details. 6 cc solution made of 5 cc of 0.2% ropivacaine, 1 cc of Decadron 10 mg/cc.  This was injected into the right glenohumeral joint under fluoroscopy after contrast confirmation. Imaging Guidance (Non-Spinal):          Type of Imaging Technique: Fluoroscopy Guidance (Non-Spinal) Indication(s): Assistance in needle guidance and placement for procedures requiring needle placement in or near specific anatomical locations not easily accessible without such assistance. Exposure Time: Please see nurses notes. Contrast: Before injecting any contrast, we confirmed that the patient did not have an allergy to iodine, shellfish, or radiological contrast. Once satisfactory needle placement was completed at the desired level, radiological contrast was injected. Contrast injected under live fluoroscopy. No contrast complications. See chart for type and volume of contrast used. Fluoroscopic Guidance: I was personally present during the use of fluoroscopy. "Tunnel Vision Technique" used to obtain the best possible view of the target area. Parallax error corrected before commencing the procedure. "Direction-depth-direction" technique used to introduce the needle under continuous pulsed fluoroscopy. Once target was reached, antero-posterior, oblique, and lateral fluoroscopic projection used confirm needle placement in  all planes. Images permanently stored in EMR. Interpretation: I personally interpreted the imaging intraoperatively. Adequate needle placement confirmed in multiple planes. Appropriate spread of contrast into desired area was observed. No evidence of afferent or efferent intravascular uptake. Permanent images saved into the patient's record.  Antibiotic Prophylaxis:   Anti-infectives (From admission, onward)   None     Indication(s): None identified  Post-operative Assessment:  Post-procedure Vital Signs:  Pulse/HCG Rate: 72  Temp: (!) 97.2 F (36.2 C) Resp: 16 BP: 126/90 SpO2: 99 %  EBL: None  Complications: No immediate post-treatment complications observed by team, or reported by patient.  Note: The patient tolerated the entire procedure well. A repeat set of vitals were taken after the procedure and the patient was kept under observation following institutional policy, for this type of procedure. Post-procedural neurological assessment was performed, showing return to baseline, prior to discharge. The patient was provided with post-procedure discharge instructions, including a section on how to identify potential problems. Should any problems arise concerning this procedure, the patient was given instructions to immediately contact us, at any time, without hesitation. In any case, we plan to contact the patient by telephone for a follow-up status report regarding this interventional procedure.  Comments:  No additional relevant information.   Controlled Substance Pharmacotherapy Assessment REMS (Risk Evaluation and Mitigation Strategy)  Analgesic:   08/05/2019  2   05/28/2019  Hydrocodone-Acetamin 10-325 MG  60.00  30 Bi Lat   [id number]   Nor (4575)   0  20.00 MME  Medicare   [place]     Janett Billow, RN  08/26/2019  8:53 AM  Sign when Signing Visit Safety precautions to be maintained throughout the outpatient stay will include: orient to surroundings, keep bed in low  position, maintain call bell within reach at all times, provide assistance with transfer out of bed and ambulation.    Pharmacokinetics: Liberation and absorption (onset of action): WNL Distribution (time to peak effect): WNL Metabolism and excretion (duration of action): WNL         Pharmacodynamics: Desired effects: Analgesia: Mr. Tumbleson reports >  50% benefit. Functional ability: Patient reports that medication allows him to accomplish basic ADLs Clinically meaningful improvement in function (CMIF): Sustained CMIF goals met Perceived effectiveness: Described as relatively effective, allowing for increase in activities of daily living (ADL) Undesirable effects: Side-effects or Adverse reactions: None reported Monitoring: [place] PMP: Online review of the past 21-monthperiod conducted. Compliant with practice rules and regulations Last UDS on record:   Ref Range & Units 8 mo ago  Tricyclic, Ur Screen NONE DETECTED NONE DETECTED   Amphetamines, Ur Screen NONE DETECTED NONE DETECTED   MDMA (Ecstasy)Ur Screen NONE DETECTED NONE DETECTED   Cocaine Metabolite,Ur [place] NONE DETECTED NONE DETECTED   Opiate, Ur Screen NONE DETECTED NONE DETECTED   Phencyclidine (PCP) Ur S NONE DETECTED NONE DETECTED   Cannabinoid 50 Ng, Ur [place] NONE DETECTED NONE DETECTED   Barbiturates, Ur Screen NONE DETECTED NONE DETECTED   Benzodiazepine, Ur Scrn NONE DETECTED NONE DETECTED   Methadone Scn, Ur NONE DETECTED NONE DETECTED   Comment: (NOTE)  Tricyclics + metabolites, urine  Cutoff 1000 ng/mL  Amphetamines + metabolites, urine Cutoff 1000 ng/mL  MDMA (Ecstasy), urine       Cutoff 500 ng/mL  Cocaine Metabolite, urine     Cutoff 300 ng/mL  Opiate + metabolites, urine    Cutoff 300 ng/mL  Phencyclidine (PCP), urine     Cutoff 25 ng/mL  Cannabinoid, urine         Cutoff 50 ng/mL  Barbiturates + metabolites, urine Cutoff 200 ng/mL  Benzodiazepine, urine       Cutoff 200 ng/mL   Methadone, urine          Cutoff 300 ng/mL  The urine drug screen provides only a preliminary, unconfirmed  analytical test result and should not be used for non-medical  purposes. Clinical consideration and professional judgment should  be applied to any positive drug screen result due to possible  interfering substances. A more specific alternate chemical method  must be used in order to obtain a confirmed analytical result.  Gas chromatography / mass spectrometry (GC/MS) is the preferred  confirmatory method.  [facility] [address], [place] [address]     UDS interpretation: Compliant          Medication Assessment Form: Reviewed. Patient indicates being compliant with therapy Treatment compliance: Compliant Risk Assessment Profile: Aberrant behavior: See initial evaluations. None observed or detected today Comorbid factors increasing risk of overdose: See initial evaluation. No additional risks detected today Opioid risk tool (ORT):  Opioid Risk  12/18/2018  Alcohol 0  Illegal Drugs 0  Rx Drugs 0  Alcohol 0  Illegal Drugs 0  Rx Drugs 0  Age between 16-45 years  0  History of Preadolescent Sexual Abuse 0  Psychological Disease 2  ADD Negative  OCD Negative  Bipolar Negative  Depression 1  Opioid Risk Tool Scoring 3  Opioid Risk Interpretation Low Risk    ORT Scoring interpretation table:  Score <3 = Low Risk for SUD  Score between 4-7 = Moderate Risk for SUD  Score >8 = High Risk for Opioid Abuse   Risk of substance use disorder (SUD): Low  Risk Mitigation Strategies:  Patient Counseling: Covered Patient-Prescriber Agreement (PPA): Present and active  Notification to other healthcare providers: Done  Pharmacologic Plan: No change in therapy, at this time.            Plan of Care  Orders:  Orders Placed This Encounter  Procedures  . DG PAIN CLINIC C-ARM 1-60 MIN NO REPORT    Intraoperative interpretation by  procedural physician at [place].    Standing Status:   Standing    Number of Occurrences:   1    Order Specific Question:   Reason for exam:    Answer:   Assistance in needle guidance and placement for procedures requiring needle placement in or near specific anatomical locations not easily accessible without such assistance.   Chronic Opioid Analgesic: Norco 10 mg BID prn  Medications ordered for procedure: Meds ordered this encounter  Medications  . iohexol (OMNIPAQUE) 180 MG/ML injection 10 mL    Must be Myelogram-compatible. If not available, you may substitute with a water-soluble, non-ionic, hypoallergenic, myelogram-compatible radiological contrast medium.  Marland Kitchen lidocaine (XYLOCAINE) 2 % (with pres) injection 400 mg  . ropivacaine (PF) 2 mg/mL (0.2%) (NAROPIN) injection 2 mL  . dexamethasone (DECADRON) injection 10 mg  . HYDROcodone-acetaminophen (NORCO) 10-325 MG tablet    Sig: Take 1 tablet by mouth 2 (two) times daily as needed for severe pain. Must last 30 days.    Dispense:  60 tablet    Refill:  0    Chronic Pain. (STOP Act - Not applicable). Fill one day early if closed on scheduled refill date.  Marland Kitchen HYDROcodone-acetaminophen (NORCO) 10-325 MG tablet    Sig: Take 1 tablet by mouth 2 (two) times daily as needed for severe pain. Must last 30 days.    Dispense:  60 tablet    Refill:  0    Chronic Pain. (STOP Act - Not applicable). Fill one day early if closed on scheduled refill date.  Marland Kitchen HYDROcodone-acetaminophen (NORCO) 10-325 MG tablet    Sig: Take 1 tablet by mouth 2 (two) times daily as needed for severe pain. Must last 30 days.    Dispense:  60 tablet    Refill:  0    Chronic Pain. (STOP Act - Not applicable). Fill one day early if closed on scheduled refill date.   Medications administered: We administered iohexol, lidocaine, ropivacaine (PF) 2 mg/mL (0.2%), and dexamethasone.  See the medical record for exact dosing, route, and time of administration.   Follow-up plan:   Return in about 4 weeks (around 09/23/2019) for Post Procedure Evaluation.      s/p R GH shoulder joint injection 04/03/19, 08/26/19 (anterior approach)- repeat prn      Recent Visits Date Type Provider Dept  05/28/19 Office Visit Gillis Santa, MD [facility]  Showing recent visits within past 90 days and meeting all other requirements   Today's Visits Date Type Provider Dept  08/26/19 Procedure visit Gillis Santa, MD [facility]  Showing today's visits and meeting all other requirements   Future Appointments Date Type Provider Dept  09/23/19 Appointment Gillis Santa, MD [facility]  11/19/19 Appointment Gillis Santa, MD [facility]  Showing future appointments within next 90 days and meeting all other requirements   Disposition: Discharge home  Discharge (Date  Time): 08/26/2019; 0928 hrs.   Primary Care Physician: Tracie Harrier, MD Location: [facility] Note by: Gillis Santa, MD Date: 08/26/2019; Time: 11:17 AM  Disclaimer:  Medicine is not an exact science. The only guarantee in medicine is that nothing is guaranteed. It is important to note that the decision to proceed with this intervention was based on the information collected from the patient. The Data and conclusions were drawn from the patient's questionnaire, the interview, and  the physical examination. Because the information was provided in large part by the patient, it cannot be guaranteed that it has not been purposely or unconsciously manipulated. Every effort has been made to obtain as much relevant data as possible for this evaluation. It is important to note that the conclusions that lead to this procedure are derived in large part from the available data. Always take into account that the treatment will also be dependent on availability of resources and existing treatment guidelines, considered by other Pain Management Practitioners  as being common knowledge and practice, at the time of the intervention. For Medico-Legal purposes, it is also important to point out that variation in procedural techniques and pharmacological choices are the acceptable norm. The indications, contraindications, technique, and results of the above procedure should only be interpreted and judged by a Board-Certified Interventional Pain Specialist with extensive familiarity and expertise in the same exact procedure and technique.

## 2019-08-27 ENCOUNTER — Ambulatory Visit (HOSPITAL_BASED_OUTPATIENT_CLINIC_OR_DEPARTMENT_OTHER): Payer: Medicare HMO | Admitting: Student in an Organized Health Care Education/Training Program

## 2019-08-27 DIAGNOSIS — M19011 Primary osteoarthritis, right shoulder: Secondary | ICD-10-CM

## 2019-08-27 NOTE — Telephone Encounter (Signed)
Attempted to call patient. Mailbox not set up to take messages.

## 2019-08-29 DIAGNOSIS — J9601 Acute respiratory failure with hypoxia: Secondary | ICD-10-CM | POA: Diagnosis not present

## 2019-08-29 DIAGNOSIS — J449 Chronic obstructive pulmonary disease, unspecified: Secondary | ICD-10-CM | POA: Diagnosis not present

## 2019-09-19 ENCOUNTER — Encounter: Payer: Self-pay | Admitting: Student in an Organized Health Care Education/Training Program

## 2019-09-21 DIAGNOSIS — J449 Chronic obstructive pulmonary disease, unspecified: Secondary | ICD-10-CM | POA: Diagnosis not present

## 2019-09-23 ENCOUNTER — Other Ambulatory Visit: Payer: Self-pay

## 2019-09-23 ENCOUNTER — Ambulatory Visit
Payer: Medicare HMO | Attending: Student in an Organized Health Care Education/Training Program | Admitting: Student in an Organized Health Care Education/Training Program

## 2019-09-23 DIAGNOSIS — G8929 Other chronic pain: Secondary | ICD-10-CM

## 2019-09-23 DIAGNOSIS — M19011 Primary osteoarthritis, right shoulder: Secondary | ICD-10-CM | POA: Diagnosis not present

## 2019-09-23 NOTE — Progress Notes (Signed)
Patient: [name]  Service Category: E/M  Provider: Gillis Santa, MD  DOB: October 10, 1960  DOS: [name]  Location: Office  MRN: [id number]  Setting: Ambulatory outpatient  Referring Provider: Tracie Harrier, MD  Type: Established Patient  Specialty: Interventional Pain Management  PCP: John Harrier, MD  Location: Home  Delivery: TeleHealth     Virtual Encounter - Pain Management PROVIDER NOTE: Information contained herein reflects review and annotations entered in association with encounter. Interpretation of such information and data should be left to medically-trained personnel. Information provided to patient can be located elsewhere in the medical record under "Patient Instructions". Document created using STT-dictation technology, any transcriptional errors that may result from process are unintentional.    Contact & Pharmacy Preferred: [place]: [phone] (home) Mobile: [phone] (mobile) E-mail: [email or web address]  [id number] [place] [address]Phone: [phone]: [phone]  Pre-screening  [name] offered "in-person" vs "virtual" encounter. He indicated preferring virtual for this encounter.   Reason COVID-19*  Social distancing based on CDC and AMA recommendations.   I contacted [name] via telephone.      I clearly identified myself as BGillis Santa MD. I verified that I was speaking with the correct person using two identifiers (Name: [name] and date of birth: [id number].  This visit was completed via telephone due to the restrictions of the COVID-19 pandemic. All issues as above were discussed and addressed but no physical exam was performed. If it was felt that the patient should be evaluated in the office, they were directed there. The patient verbally consented to this visit. Patient was unable to complete an audio/visual visit due to  Technical difficulties and/or Lack of internet. Due to the catastrophic nature of the COVID-19 pandemic, this visit was done through audio contact only.  Location of the patient: home address (see Epic for details)  Location of the provider: office   Consent I sought verbal advanced consent from John Sawyerfor virtual visit interactions. I informed [name] of possible security and privacy concerns, risks, and limitations associated with providing "not-in-person" medical evaluation and management services. I also informed [name] of the availability of "in-person" appointments. Finally, I informed him that there would be a charge for the virtual visit and that he could be  personally, fully or partially, financially responsible for it. [name] understanding and agreed to proceed.   Historic Elements   [name] a 59y.o. year old, male patient evaluated today after his last contact with our practice on 08/26/2019. [name] has a past medical history of Cancer ([facility], CHF (congestive heart failure) ([place], COPD (chronic obstructive pulmonary disease) ([place], Coronary artery disease, Diabetes mellitus without complication ([place], Dyspnea, Fibromyalgia, History of kidney stones, MI (myocardial infarction) ([place], Peripheral vascular disease ([place], and Pneumonia. He also  has a past surgical history that includes Cardiac catheterization (N/A, 06/09/2015); Cardiac catheterization (06/09/2015); Coronary artery bypass graft (2015); Cardiac catheterization; Application if wound vac; Wound debridement; stent in leg (Left); Endarterectomy (Left, 09/08/2017); and Lower Extremity Angiography (Right, 01/29/2019). Mr. WFarringtonhas a current medication list which includes the following prescription(s): acetaminophen, albuterol, aspirin ec, carvedilol, furosemide, [START ON 10/04/2019] hydrocodone-acetaminophen, [START ON 11/03/2019] hydrocodone-acetaminophen, lisinopril, metformin, metolazone,  esbriet, pravastatin, sodium chloride, tamsulosin, diclofenac sodium, glipizide, hydrocodone-acetaminophen, tiotropium, and victoza. He  reports that he quit smoking about 5 years ago. His smoking use included cigarettes.  He has never used smokeless tobacco. He reports previous alcohol use. He reports that he does not use drugs. [name] has No Known Allergies.   HPI  Today, he is being contacted for a post-procedure assessment.  Postprocedural follow-up status post right glenohumeral shoulder steroid injection #2.  His previous injection was done utilizing a posterior approach.  Patient states that while his pain is approximately the same at baseline, he has less pain with shoulder movements and he feels that his range of motion has improved.  He states that the anterior approach while more uncomfortable in the posterior approach provided better benefit in regards to glenohumeral range of motion improvement.  We will place as needed order for repeat right glenohumeral shoulder steroid injection via the anterior approach under fluoroscopy.   Laboratory Chemistry Profile   Renal Lab Results  Component Value Date   BUN 21 (H) 01/29/2019   CREATININE 1.01 01/29/2019   GFRAA >60 01/29/2019   GFRNONAA >60 01/29/2019    Hepatic Lab Results  Component Value Date   AST 21 08/17/2017   ALT 20 08/17/2017   ALBUMIN 3.7 08/17/2017   ALKPHOS 44 08/17/2017    Electrolytes Lab Results  Component Value Date   NA 137 07/23/2018   K 4.2 07/23/2018   CL 102 07/23/2018   CALCIUM 8.5 (L) 07/23/2018   MG 2.0 07/23/2018   PHOS 4.4 09/10/2017    Bone No results found for: VD25OH, VD125OH2TOT, KT6256LS9, HT3428JG8, 25OHVITD1, 25OHVITD2, 25OHVITD3, TESTOFREE, TESTOSTERONE  Inflammation (CRP: Acute Phase) (ESR: Chronic Phase) Lab Results  Component Value Date   LATICACIDVEN 1.4 09/09/2017      Note: Above Lab results reviewed.  Imaging  DG PAIN CLINIC C-ARM 1-60 MIN NO REPORT Fluoro was used, but  no Radiologist interpretation will be provided.  Please refer to "NOTES" tab for provider progress note.  Assessment  The primary encounter diagnosis was Primary osteoarthritis of right shoulder. A diagnosis of Chronic right shoulder pain was also pertinent to this visit.  Plan of Care   [name] has a current medication list which includes the following long-term medication(s): albuterol, carvedilol, furosemide, metformin, metolazone, pravastatin, sodium chloride, glipizide, tiotropium, and victoza.  Orders:  Orders Placed This Encounter  Procedures  . SHOULDER INJECTION    For shoulder pain.    Standing Status:   Standing    Number of Occurrences:   1    Standing Expiration Date:   09/22/2020    Scheduling Instructions:     Side: RIGHT anterior approach     Sedation: Patient's choice.     TIMEFRAME: PRN procedure. ([name] will call when needed.)    Order Specific Question:   Where will this procedure be performed?    Answer:   [facility]    Comments:   [place] [place]   Follow-up plan:   Return for Keep sch. appt.     s/p R GH shoulder joint injection 04/03/19, 08/26/19 (anterior approach)- repeat prn       Recent Visits Date Type Provider Dept  08/26/19 Procedure visit Gillis Santa, MD [facility]  Showing recent visits within past 90 days and meeting all other requirements   Today's Visits Date Type Provider Dept  09/23/19 Office Visit Gillis Santa, MD [facility]  Showing today's visits and meeting all other requirements   Future Appointments Date Type Provider Dept  11/19/19 Appointment Gillis Santa, MD [facility]  Showing future appointments within next 90 days and  meeting all other requirements   I discussed the assessment and treatment plan with the patient. The patient was provided an opportunity to ask questions and all were answered. The patient agreed with the plan and demonstrated an understanding of the  instructions.  Patient advised to call back or seek an in-person evaluation if the symptoms or condition worsens.  Duration of encounter: 81mnutes.  Note by: BGillis Santa MD Date: [name]; Time: 2:23 PM

## 2019-09-26 DIAGNOSIS — J9601 Acute respiratory failure with hypoxia: Secondary | ICD-10-CM | POA: Diagnosis not present

## 2019-09-26 DIAGNOSIS — J449 Chronic obstructive pulmonary disease, unspecified: Secondary | ICD-10-CM | POA: Diagnosis not present

## 2019-10-06 ENCOUNTER — Other Ambulatory Visit (INDEPENDENT_AMBULATORY_CARE_PROVIDER_SITE_OTHER): Payer: Self-pay | Admitting: Vascular Surgery

## 2019-10-07 NOTE — Telephone Encounter (Signed)
Does this pts still need a refill on this med per Dr. Carlus Pavlov it may not be appropriate .

## 2019-10-10 NOTE — Telephone Encounter (Signed)
It doesn't appear to be on the patient's med list and we actually haven't seen the patient in office technically since august.  We can decline for now/

## 2019-10-19 DIAGNOSIS — J449 Chronic obstructive pulmonary disease, unspecified: Secondary | ICD-10-CM | POA: Diagnosis not present

## 2019-10-21 DIAGNOSIS — I739 Peripheral vascular disease, unspecified: Secondary | ICD-10-CM | POA: Diagnosis not present

## 2019-10-21 DIAGNOSIS — I9789 Other postprocedural complications and disorders of the circulatory system, not elsewhere classified: Secondary | ICD-10-CM | POA: Diagnosis not present

## 2019-10-21 DIAGNOSIS — I6522 Occlusion and stenosis of left carotid artery: Secondary | ICD-10-CM | POA: Diagnosis not present

## 2019-10-21 DIAGNOSIS — E1142 Type 2 diabetes mellitus with diabetic polyneuropathy: Secondary | ICD-10-CM | POA: Diagnosis not present

## 2019-10-21 DIAGNOSIS — I429 Cardiomyopathy, unspecified: Secondary | ICD-10-CM | POA: Diagnosis not present

## 2019-10-21 DIAGNOSIS — I2510 Atherosclerotic heart disease of native coronary artery without angina pectoris: Secondary | ICD-10-CM | POA: Diagnosis not present

## 2019-10-21 DIAGNOSIS — E1151 Type 2 diabetes mellitus with diabetic peripheral angiopathy without gangrene: Secondary | ICD-10-CM | POA: Diagnosis not present

## 2019-10-21 DIAGNOSIS — I5022 Chronic systolic (congestive) heart failure: Secondary | ICD-10-CM | POA: Diagnosis not present

## 2019-10-21 DIAGNOSIS — I214 Non-ST elevation (NSTEMI) myocardial infarction: Secondary | ICD-10-CM | POA: Diagnosis not present

## 2019-10-27 DIAGNOSIS — J9601 Acute respiratory failure with hypoxia: Secondary | ICD-10-CM | POA: Diagnosis not present

## 2019-10-27 DIAGNOSIS — J449 Chronic obstructive pulmonary disease, unspecified: Secondary | ICD-10-CM | POA: Diagnosis not present

## 2019-11-14 ENCOUNTER — Encounter (INDEPENDENT_AMBULATORY_CARE_PROVIDER_SITE_OTHER): Payer: Medicare HMO

## 2019-11-14 ENCOUNTER — Ambulatory Visit (INDEPENDENT_AMBULATORY_CARE_PROVIDER_SITE_OTHER): Payer: Medicare HMO | Admitting: Vascular Surgery

## 2019-11-18 ENCOUNTER — Encounter: Payer: Self-pay | Admitting: Student in an Organized Health Care Education/Training Program

## 2019-11-19 ENCOUNTER — Ambulatory Visit
Payer: Medicare HMO | Attending: Student in an Organized Health Care Education/Training Program | Admitting: Student in an Organized Health Care Education/Training Program

## 2019-11-19 ENCOUNTER — Encounter: Payer: Self-pay | Admitting: Student in an Organized Health Care Education/Training Program

## 2019-11-19 ENCOUNTER — Other Ambulatory Visit: Payer: Self-pay

## 2019-11-19 ENCOUNTER — Telehealth: Payer: Self-pay | Admitting: *Deleted

## 2019-11-19 DIAGNOSIS — I25810 Atherosclerosis of coronary artery bypass graft(s) without angina pectoris: Secondary | ICD-10-CM

## 2019-11-19 DIAGNOSIS — G894 Chronic pain syndrome: Secondary | ICD-10-CM

## 2019-11-19 DIAGNOSIS — E1142 Type 2 diabetes mellitus with diabetic polyneuropathy: Secondary | ICD-10-CM | POA: Diagnosis not present

## 2019-11-19 DIAGNOSIS — I6522 Occlusion and stenosis of left carotid artery: Secondary | ICD-10-CM | POA: Diagnosis not present

## 2019-11-19 DIAGNOSIS — M19011 Primary osteoarthritis, right shoulder: Secondary | ICD-10-CM

## 2019-11-19 DIAGNOSIS — I509 Heart failure, unspecified: Secondary | ICD-10-CM

## 2019-11-19 DIAGNOSIS — G8929 Other chronic pain: Secondary | ICD-10-CM

## 2019-11-19 DIAGNOSIS — M25511 Pain in right shoulder: Secondary | ICD-10-CM

## 2019-11-19 DIAGNOSIS — J449 Chronic obstructive pulmonary disease, unspecified: Secondary | ICD-10-CM | POA: Diagnosis not present

## 2019-11-19 MED ORDER — HYDROCODONE-ACETAMINOPHEN 10-325 MG PO TABS: 1.0000 | ORAL_TABLET | Freq: Two times a day (BID) | ORAL | 0 refills | Status: DC | PRN

## 2019-11-19 NOTE — Telephone Encounter (Signed)
Sw pt made him aware d/t slots to complete his UDS.Marland KitchenMarland KitchenTD

## 2019-11-19 NOTE — Progress Notes (Signed)
Patient: [name]  Service Category: E/M  Provider: Gillis Santa, MD  DOB: [birth year]  DOS: 11/19/2019  Location: Office  MRN: [id number]  Setting: Ambulatory outpatient  Referring Provider: Tracie Harrier, MD  Type: Established Patient  Specialty: Interventional Pain Management  PCP: Tracie Harrier, MD  Location: Home  Delivery: TeleHealth     Virtual Encounter - Pain Management PROVIDER NOTE: Information contained herein reflects review and annotations entered in association with encounter. Interpretation of such information and data should be left to medically-trained personnel. Information provided to patient can be located elsewhere in the medical record under "Patient Instructions". Document created using STT-dictation technology, any transcriptional errors that may result from process are unintentional.    Contact & Pharmacy Preferred: [place]: [phone] (home) Mobile: [phone] (mobile) E-mail: [email or web address]  [id number] [place] [address]Phone: [phone]: [phone]  Pre-screening  Mr. Sobiech offered "in-person" vs "virtual" encounter. He indicated preferring virtual for this encounter.   Reason COVID-19*  Social distancing based on CDC and AMA recommendations.   I contacted WClinton Sawyeron 11/19/2019 via video conference.      I clearly identified myself as BGillis Santa MD. I verified that I was speaking with the correct person using two identifiers (Name: WNIEVES BARBERI and date of birth: [birth year].  Consent I sought verbal advanced consent from [name] virtual visit interactions. I informed Mr. Mendiola of possible security and privacy concerns, risks, and limitations associated with providing "not-in-person" medical evaluation and management services. I also informed Mr. Koors of the availability of "in-person" appointments. Finally, I informed him that  there would be a charge for the virtual visit and that he could be  personally, fully or partially, financially responsible for it. Mr. WKassaexpressed understanding and agreed to proceed.   Historic Elements   Mr. WADONYS WILDESis a 59y.o. year old, male patient evaluated today after his last contact with our practice on 09/23/2019. Mr. WSanchez has a past medical history of Cancer ([facility], CHF (congestive heart failure) ([place], COPD (chronic obstructive pulmonary disease) ([place], Coronary artery disease, Diabetes mellitus without complication ([place], Dyspnea, Fibromyalgia, History of kidney stones, MI (myocardial infarction) ([place], Peripheral vascular disease ([place], and Pneumonia. He also  has a past surgical history that includes Cardiac catheterization (N/A, 06/09/2015); Cardiac catheterization (06/09/2015); Coronary artery bypass graft (2015); Cardiac catheterization; Application if wound vac; Wound debridement; stent in leg (Left); Endarterectomy (Left, 09/08/2017); and Lower Extremity Angiography (Right, 01/29/2019). Mr. WPoupardhas a current medication list which includes the following prescription(s): acetaminophen, albuterol, aspirin ec, carvedilol, diclofenac sodium, furosemide, glipizide, [START ON 12/02/2019] hydrocodone-acetaminophen, [START ON 01/01/2020] hydrocodone-acetaminophen, lisinopril, metformin, metolazone, esbriet, pravastatin, sodium chloride, spironolactone, tamsulosin, tiotropium, and victoza. He  reports that he quit smoking about 6 years ago. His smoking use included cigarettes. He has never used smokeless tobacco. He reports previous alcohol use. He reports that he does not use drugs. Mr. WKalbhas No Known Allergies.   HPI  Today, he is being contacted for medication management.  [place] landed on right shoulder. States that fall may have helped mobility but pain is still there. Doing well with medication states that they help manage his pain however on  painful days days he does have to take an extra half a tablet to a full tablet when he is having a pain flare in the evening. Would like  to hold off on repeating shoulder steroid injection at this time.  Pharmacotherapy Assessment  Analgesic: 11/05/2019  3   08/26/2019  Hydrocodone-Acetamin 10-325 MG  60.00  30 Bi Lat   [id number]   Nor (4575)   0  20.00 MME  Medicare   [place]    Monitoring: [place] PMP: PDMP reviewed during this encounter.       Pharmacotherapy: No side-effects or adverse reactions reported. Compliance: No problems identified. Effectiveness: Clinically acceptable. Plan: Refer to "POC".  UDS: No results found for: SUMMARY Laboratory Chemistry Profile   Renal Lab Results  Component Value Date   BUN 21 (H) 01/29/2019   CREATININE 1.01 01/29/2019   GFRAA >60 01/29/2019   GFRNONAA >60 01/29/2019     Hepatic Lab Results  Component Value Date   AST 21 08/17/2017   ALT 20 08/17/2017   ALBUMIN 3.7 08/17/2017   ALKPHOS 44 08/17/2017     Electrolytes Lab Results  Component Value Date   NA 137 07/23/2018   K 4.2 07/23/2018   CL 102 07/23/2018   CALCIUM 8.5 (L) 07/23/2018   MG 2.0 07/23/2018   PHOS 4.4 09/10/2017     Bone No results found for: VD25OH, VD125OH2TOT, AY0459XH7, SF4239RV2, 25OHVITD1, 25OHVITD2, 25OHVITD3, TESTOFREE, TESTOSTERONE   Inflammation (CRP: Acute Phase) (ESR: Chronic Phase) Lab Results  Component Value Date   LATICACIDVEN 1.4 09/09/2017       Note: Above Lab results reviewed.   Assessment  The primary encounter diagnosis was Primary osteoarthritis of right shoulder. Diagnoses of Chronic right shoulder pain, Coronary artery disease involving other coronary artery bypass graft, angina presence unspecified, Congestive heart failure, unspecified HF chronicity, unspecified heart failure type ([place]), Carotid stenosis, symptomatic w/o infarct, left, Type 2 diabetes mellitus with peripheral neuropathy ([place]), and Chronic pain syndrome were also pertinent  to this visit.  Plan of Care  Mr. GRYFFIN ALTICE has a current medication list which includes the following long-term medication(s): albuterol, carvedilol, furosemide, glipizide, metformin, metolazone, pravastatin, sodium chloride, spironolactone, tiotropium, and victoza.  Pharmacotherapy (Medications Ordered): Meds ordered this encounter  Medications  . HYDROcodone-acetaminophen (NORCO) 10-325 MG tablet    Sig: Take 1-2 tablets by mouth 2 (two) times daily as needed for severe pain. Must last 30 days.    Dispense:  75 tablet    Refill:  0    Chronic Pain. (STOP Act - Not applicable). Fill one day early if closed on scheduled refill date.  Marland Kitchen HYDROcodone-acetaminophen (NORCO) 10-325 MG tablet    Sig: Take 1-2 tablets by mouth 2 (two) times daily as needed for severe pain. Must last 30 days.    Dispense:  75 tablet    Refill:  0    Chronic Pain. (STOP Act - Not applicable). Fill one day early if closed on scheduled refill date.   Follow-up plan:   Return in about 2 months (around 01/23/2020) for Medication Management, in person, (UDS).     s/p R GH shoulder joint injection 04/03/19, 08/26/19 (anterior approach)- repeat prn        Recent Visits Date Type Provider Dept  09/23/19 Office Visit Gillis Santa, MD [facility]  08/26/19 Procedure visit Gillis Santa, MD [facility]  Showing recent visits within past 90 days and meeting all other requirements   Today's Visits Date Type Provider Dept  11/19/19 Telemedicine Gillis Santa, MD [facility]  Showing today's visits and meeting all other requirements   Future Appointments No visits were found meeting these conditions.  Showing future appointments within next 90 days and meeting all other requirements   I discussed the assessment and treatment plan with the patient. The patient was provided an opportunity to ask questions and all were answered. The patient agreed with the plan and demonstrated an  understanding of the instructions.  Patient advised to call back or seek an in-person evaluation if the symptoms or condition worsens.  Duration of encounter: 39mnutes.  Note by: BGillis Santa MD Date: 11/19/2019; Time: 9:06 AM

## 2019-11-20 ENCOUNTER — Other Ambulatory Visit (INDEPENDENT_AMBULATORY_CARE_PROVIDER_SITE_OTHER): Payer: Self-pay | Admitting: Vascular Surgery

## 2019-11-20 DIAGNOSIS — I739 Peripheral vascular disease, unspecified: Secondary | ICD-10-CM

## 2019-11-21 ENCOUNTER — Other Ambulatory Visit: Payer: Self-pay | Admitting: Student in an Organized Health Care Education/Training Program

## 2019-11-21 DIAGNOSIS — J439 Emphysema, unspecified: Secondary | ICD-10-CM | POA: Diagnosis not present

## 2019-11-21 DIAGNOSIS — M19011 Primary osteoarthritis, right shoulder: Secondary | ICD-10-CM | POA: Diagnosis not present

## 2019-11-21 DIAGNOSIS — I739 Peripheral vascular disease, unspecified: Secondary | ICD-10-CM | POA: Diagnosis not present

## 2019-11-21 DIAGNOSIS — G894 Chronic pain syndrome: Secondary | ICD-10-CM | POA: Diagnosis not present

## 2019-11-21 DIAGNOSIS — E1165 Type 2 diabetes mellitus with hyperglycemia: Secondary | ICD-10-CM | POA: Diagnosis not present

## 2019-11-21 DIAGNOSIS — E1142 Type 2 diabetes mellitus with diabetic polyneuropathy: Secondary | ICD-10-CM | POA: Diagnosis not present

## 2019-11-21 DIAGNOSIS — J84112 Idiopathic pulmonary fibrosis: Secondary | ICD-10-CM | POA: Diagnosis not present

## 2019-11-21 DIAGNOSIS — Z951 Presence of aortocoronary bypass graft: Secondary | ICD-10-CM | POA: Diagnosis not present

## 2019-11-21 DIAGNOSIS — Z794 Long term (current) use of insulin: Secondary | ICD-10-CM | POA: Diagnosis not present

## 2019-11-21 DIAGNOSIS — J9611 Chronic respiratory failure with hypoxia: Secondary | ICD-10-CM | POA: Diagnosis not present

## 2019-11-22 DIAGNOSIS — E1159 Type 2 diabetes mellitus with other circulatory complications: Secondary | ICD-10-CM | POA: Diagnosis not present

## 2019-11-22 DIAGNOSIS — Z794 Long term (current) use of insulin: Secondary | ICD-10-CM | POA: Diagnosis not present

## 2019-11-22 DIAGNOSIS — E1151 Type 2 diabetes mellitus with diabetic peripheral angiopathy without gangrene: Secondary | ICD-10-CM | POA: Diagnosis not present

## 2019-11-22 DIAGNOSIS — J84112 Idiopathic pulmonary fibrosis: Secondary | ICD-10-CM | POA: Diagnosis not present

## 2019-11-22 DIAGNOSIS — J9611 Chronic respiratory failure with hypoxia: Secondary | ICD-10-CM | POA: Diagnosis not present

## 2019-11-22 DIAGNOSIS — E1165 Type 2 diabetes mellitus with hyperglycemia: Secondary | ICD-10-CM | POA: Diagnosis not present

## 2019-11-22 DIAGNOSIS — E1142 Type 2 diabetes mellitus with diabetic polyneuropathy: Secondary | ICD-10-CM | POA: Diagnosis not present

## 2019-11-22 DIAGNOSIS — I2510 Atherosclerotic heart disease of native coronary artery without angina pectoris: Secondary | ICD-10-CM | POA: Diagnosis not present

## 2019-11-22 DIAGNOSIS — J439 Emphysema, unspecified: Secondary | ICD-10-CM | POA: Diagnosis not present

## 2019-11-25 LAB — COMPLIANCE DRUG ANALYSIS, UR

## 2019-11-26 ENCOUNTER — Encounter: Payer: Medicare HMO | Admitting: Student in an Organized Health Care Education/Training Program

## 2019-11-26 DIAGNOSIS — J449 Chronic obstructive pulmonary disease, unspecified: Secondary | ICD-10-CM | POA: Diagnosis not present

## 2019-11-26 DIAGNOSIS — J9601 Acute respiratory failure with hypoxia: Secondary | ICD-10-CM | POA: Diagnosis not present

## 2019-12-02 ENCOUNTER — Ambulatory Visit (INDEPENDENT_AMBULATORY_CARE_PROVIDER_SITE_OTHER): Payer: Medicare HMO

## 2019-12-02 ENCOUNTER — Encounter (INDEPENDENT_AMBULATORY_CARE_PROVIDER_SITE_OTHER): Payer: Self-pay | Admitting: Vascular Surgery

## 2019-12-02 ENCOUNTER — Other Ambulatory Visit: Payer: Self-pay

## 2019-12-02 ENCOUNTER — Ambulatory Visit (INDEPENDENT_AMBULATORY_CARE_PROVIDER_SITE_OTHER): Payer: Medicare HMO | Admitting: Vascular Surgery

## 2019-12-02 VITALS — BP 99/70 | HR 76 | Ht 67.0 in | Wt 207.0 lb

## 2019-12-02 DIAGNOSIS — E785 Hyperlipidemia, unspecified: Secondary | ICD-10-CM

## 2019-12-02 DIAGNOSIS — E1142 Type 2 diabetes mellitus with diabetic polyneuropathy: Secondary | ICD-10-CM

## 2019-12-02 DIAGNOSIS — I25810 Atherosclerosis of coronary artery bypass graft(s) without angina pectoris: Secondary | ICD-10-CM

## 2019-12-02 DIAGNOSIS — I6523 Occlusion and stenosis of bilateral carotid arteries: Secondary | ICD-10-CM | POA: Diagnosis not present

## 2019-12-02 DIAGNOSIS — J449 Chronic obstructive pulmonary disease, unspecified: Secondary | ICD-10-CM

## 2019-12-02 DIAGNOSIS — I739 Peripheral vascular disease, unspecified: Secondary | ICD-10-CM

## 2019-12-02 NOTE — Progress Notes (Signed)
MRN : [id number]  [name] is a 59 y.o. (October 25, 1960) male who presents with chief complaint of No chief complaint on file. Marland Kitchen  History of Present Illness:   The patient returns to the office for followup and review of the noninvasive studies. There have been no interval changes in lower extremity symptoms. No interval shortening of the patient's claudication distance or development of rest pain symptoms. No new ulcers or wounds have occurred since the last visit.  There have been no significant changes to the patient's overall health care.  The patient denies amaurosis fugax or recent TIA symptoms. There are no recent neurological changes noted. The patient denies history of DVT, PE or superficial thrombophlebitis. The patient denies recent episodes of angina or shortness of breath.   ABI Rt=1.28 and Lt=0.91  (previous ABI's Rt=1.56  and Lt=0.88) Duplex ultrasound of the right lower extremity has biphasic waveforms within the anterior tibial artery and triphasic within the posterior tibial artery.  It remains unchanged compared to the last study.  There is a moderate restenosis in the mid stent which is unchanged  No outpatient medications have been marked as taking for the 12/02/19 encounter (Appointment) with Delana Meyer, Dolores Lory, MD.    Past Medical History:  Diagnosis Date  . Cancer ([place])    skin (scalp)  . CHF (congestive heart failure) ([place])   . COPD (chronic obstructive pulmonary disease) ([place])   . Coronary artery disease   . Diabetes mellitus without complication ([place])   . Dyspnea   . Fibromyalgia   . History of kidney stones    asymptomatic  . MI (myocardial infarction) ([place])   . Peripheral vascular disease ([place])   . Pneumonia     Past Surgical History:  Procedure Laterality Date  . APPLICATION OF WOUND VAC     chest wound s/p CABG  . CARDIAC CATHETERIZATION    . CORONARY ARTERY BYPASS GRAFT  2015   3 vessels  . ENDARTERECTOMY Left 09/08/2017   Procedure: ENDARTERECTOMY CAROTID;  Surgeon: Katha Cabal, MD;  Location: [facility];  Service: Vascular;  Laterality: Left;  . LOWER EXTREMITY ANGIOGRAPHY Right 01/29/2019   Procedure: LOWER EXTREMITY ANGIOGRAPHY;  Surgeon: Katha Cabal, MD;  Location: [place] [facility];  Service: Cardiovascular;  Laterality: Right;  . PERIPHERAL VASCULAR CATHETERIZATION N/A 06/09/2015   Procedure: Abdominal Aortogram w/Lower Extremity;  Surgeon: Katha Cabal, MD;  Location: [facility];  Service: Cardiovascular;  Laterality: N/A;  . PERIPHERAL VASCULAR CATHETERIZATION  06/09/2015   Procedure: Lower Extremity Intervention;  Surgeon: Katha Cabal, MD;  Location: [place];  Service: Cardiovascular;;  . stent in leg Left   . WOUND DEBRIDEMENT     debridement of chest wound x 2 s/p CABG     Social History Social History   Tobacco Use  . Smoking status: Former Smoker    Types: Cigarettes    Quit date: 10/20/2013    Years since quitting: 6.1  . Smokeless tobacco: Never Used  Substance Use Topics  . Alcohol use: Not Currently    Comment: seldom  . Drug use: No    Family History Family History  Problem Relation Age of Onset  . Heart attack Mother   . Cancer Father   . Mental illness Neg Hx     No Known Allergies   REVIEW OF SYSTEMS (Negative unless checked)  Constitutional: [] Weight loss  [] Fever  [] Chills Cardiac: [] Chest pain   [] Chest pressure   []   Palpitations   [] Shortness of breath when laying flat   [] Shortness of breath with exertion. Vascular:  [x] Pain in legs with walking   [] Pain in legs at rest  [] History of DVT   [] Phlebitis   [] Swelling in legs   [] Varicose veins   [] Non-healing ulcers Pulmonary:   [] Uses home oxygen   [] Productive cough   [] Hemoptysis   [] Wheeze  [x] COPD   [] Asthma Neurologic:  [] Dizziness   [] Seizures   [] History of stroke   [] History of TIA  [] Aphasia   [] Vissual changes   [] Weakness or numbness in arm   [] Weakness or  numbness in leg Musculoskeletal:   [] Joint swelling   [x] Joint pain   [x] Low back pain Hematologic:  [] Easy bruising  [] Easy bleeding   [] Hypercoagulable state   [] Anemic Gastrointestinal:  [] Diarrhea   [] Vomiting  [] Gastroesophageal reflux/heartburn   [] Difficulty swallowing. Genitourinary:  [] Chronic kidney disease   [] Difficult urination  [] Frequent urination   [] Blood in urine Skin:  [] Rashes   [] Ulcers  Psychological:  [] History of anxiety   []  History of major depression.  Physical Examination  There were no vitals filed for this visit. There is no height or weight on file to calculate BMI. Gen: WD/WN, NAD Head: [place]/AT, No temporalis wasting.  Ear/Nose/Throat: Hearing grossly intact, nares w/o erythema or drainage Eyes: PER, EOMI, sclera nonicteric.  Neck: Supple, no large masses.   Pulmonary:  Good air movement, no audible wheezing bilaterally, no use of accessory muscles.  Cardiac: RRR, no JVD Vascular: soft left carotid bruit Vessel Right Left  Radial Palpable Palpable  Carotid Palpable Palpable  PT Trace Palpable Not Palpable  DP Trace Palpable Not Palpable  Gastrointestinal: Non-distended. No guarding/no peritoneal signs.  Musculoskeletal: M/S 5/5 throughout.  No deformity or atrophy.  Neurologic: CN 2-12 intact. Symmetrical.  Speech is fluent. Motor exam as listed above. Psychiatric: Judgment intact, Mood & affect appropriate for pt's clinical situation. Dermatologic: No rashes or ulcers noted.  No changes consistent with cellulitis.   CBC Lab Results  Component Value Date   WBC 7.6 07/21/2018   HGB 11.9 (L) 07/21/2018   HCT 39.0 07/21/2018   MCV 97.0 07/21/2018   PLT 160 07/21/2018    BMET    Component Value Date/Time   NA 137 07/23/2018 0823   NA 134 (L) 10/22/2013 0507   K 4.2 07/23/2018 0823   K 4.5 10/22/2013 0507   CL 102 07/23/2018 0823   CL 101 10/22/2013 0507   CO2 27 07/23/2018 0823   CO2 28 10/22/2013 0507   GLUCOSE 174 (H) 07/23/2018 0823    GLUCOSE 300 (H) 10/22/2013 0507   BUN 21 (H) 01/29/2019 0741   BUN 32 (H) 10/22/2013 0507   CREATININE 1.01 01/29/2019 0741   CREATININE 1.18 10/22/2013 0507   CALCIUM 8.5 (L) 07/23/2018 0823   CALCIUM 8.0 (L) 10/22/2013 0507   GFRNONAA >60 01/29/2019 0741   GFRNONAA >60 10/22/2013 0507   GFRAA >60 01/29/2019 0741   GFRAA >60 10/22/2013 0507   CrCl cannot be calculated (Patient's most recent lab result is older than the maximum 21 days allowed.).  COAG Lab Results  Component Value Date   INR 1.07 09/09/2017   INR 1.04 09/08/2017   INR 0.87 07/31/2017    Radiology No results found.    Assessment/Plan 1. PAD (peripheral artery disease) (HCC) Recommend:  The patient is status post successful angiogram with intervention.  The patient reports that the claudication symptoms and leg pain is essentially gone.  The patient denies lifestyle limiting changes at this point in time.  No further invasive studies, angiography or surgery at this time The patient should continue walking and begin a more formal exercise program.  The patient should continue antiplatelet therapy and aggressive treatment of the lipid abnormalities  Patient should undergo noninvasive studies as ordered. The patient will follow up with me after the studies.   - VAS [place] LOWER EXTREMITY ARTERIAL DUPLEX; Future - VAS [place] ABI WITH/WO TBI; Future  2. Bilateral carotid artery stenosis Recommend:  Given the patient's asymptomatic subcritical stenosis no further invasive testing or surgery at this time.  Duplex ultrasound shows <50% stenosis bilaterally.  Continue antiplatelet therapy as prescribed Continue management of CAD, HTN and Hyperlipidemia Healthy heart diet,  encouraged exercise at least 4 times per week Follow up in 12 months with duplex ultrasound and physical exam   3. Coronary artery disease involving other coronary artery bypass graft, angina presence unspecified Continue cardiac and  antihypertensive medications as already ordered and reviewed, no changes at this time.  Continue statin as ordered and reviewed, no changes at this time  Nitrates PRN for chest pain   4. Chronic obstructive pulmonary disease, unspecified COPD type ([place]) Continue pulmonary medications and aerosols as already ordered, these medications have been reviewed and there are no changes at this time.    5. Type 2 diabetes mellitus with peripheral neuropathy ([place]) Continue hypoglycemic medications as already ordered, these medications have been reviewed and there are no changes at this time.  Hgb A1C to be monitored as already arranged by primary service   6. Hyperlipidemia, unspecified hyperlipidemia type Continue statin as ordered and reviewed, no changes at this time     Hortencia Pilar, MD  12/02/2019 1:21 PM

## 2019-12-17 DIAGNOSIS — R0600 Dyspnea, unspecified: Secondary | ICD-10-CM | POA: Diagnosis not present

## 2019-12-17 DIAGNOSIS — J84112 Idiopathic pulmonary fibrosis: Secondary | ICD-10-CM | POA: Diagnosis not present

## 2019-12-19 DIAGNOSIS — J449 Chronic obstructive pulmonary disease, unspecified: Secondary | ICD-10-CM | POA: Diagnosis not present

## 2019-12-27 DIAGNOSIS — J9601 Acute respiratory failure with hypoxia: Secondary | ICD-10-CM | POA: Diagnosis not present

## 2019-12-27 DIAGNOSIS — J449 Chronic obstructive pulmonary disease, unspecified: Secondary | ICD-10-CM | POA: Diagnosis not present

## 2020-01-01 ENCOUNTER — Other Ambulatory Visit (INDEPENDENT_AMBULATORY_CARE_PROVIDER_SITE_OTHER): Payer: Self-pay | Admitting: Vascular Surgery

## 2020-01-01 IMAGING — DX DG CHEST 1V PORT
1 series · 1 of 1 positions shown · non-contrast
Comparison: Chest CT 01/11/2017

CLINICAL DATA: Postoperative hypoxia.

EXAM:
PORTABLE CHEST 1 VIEW

[chest ap]
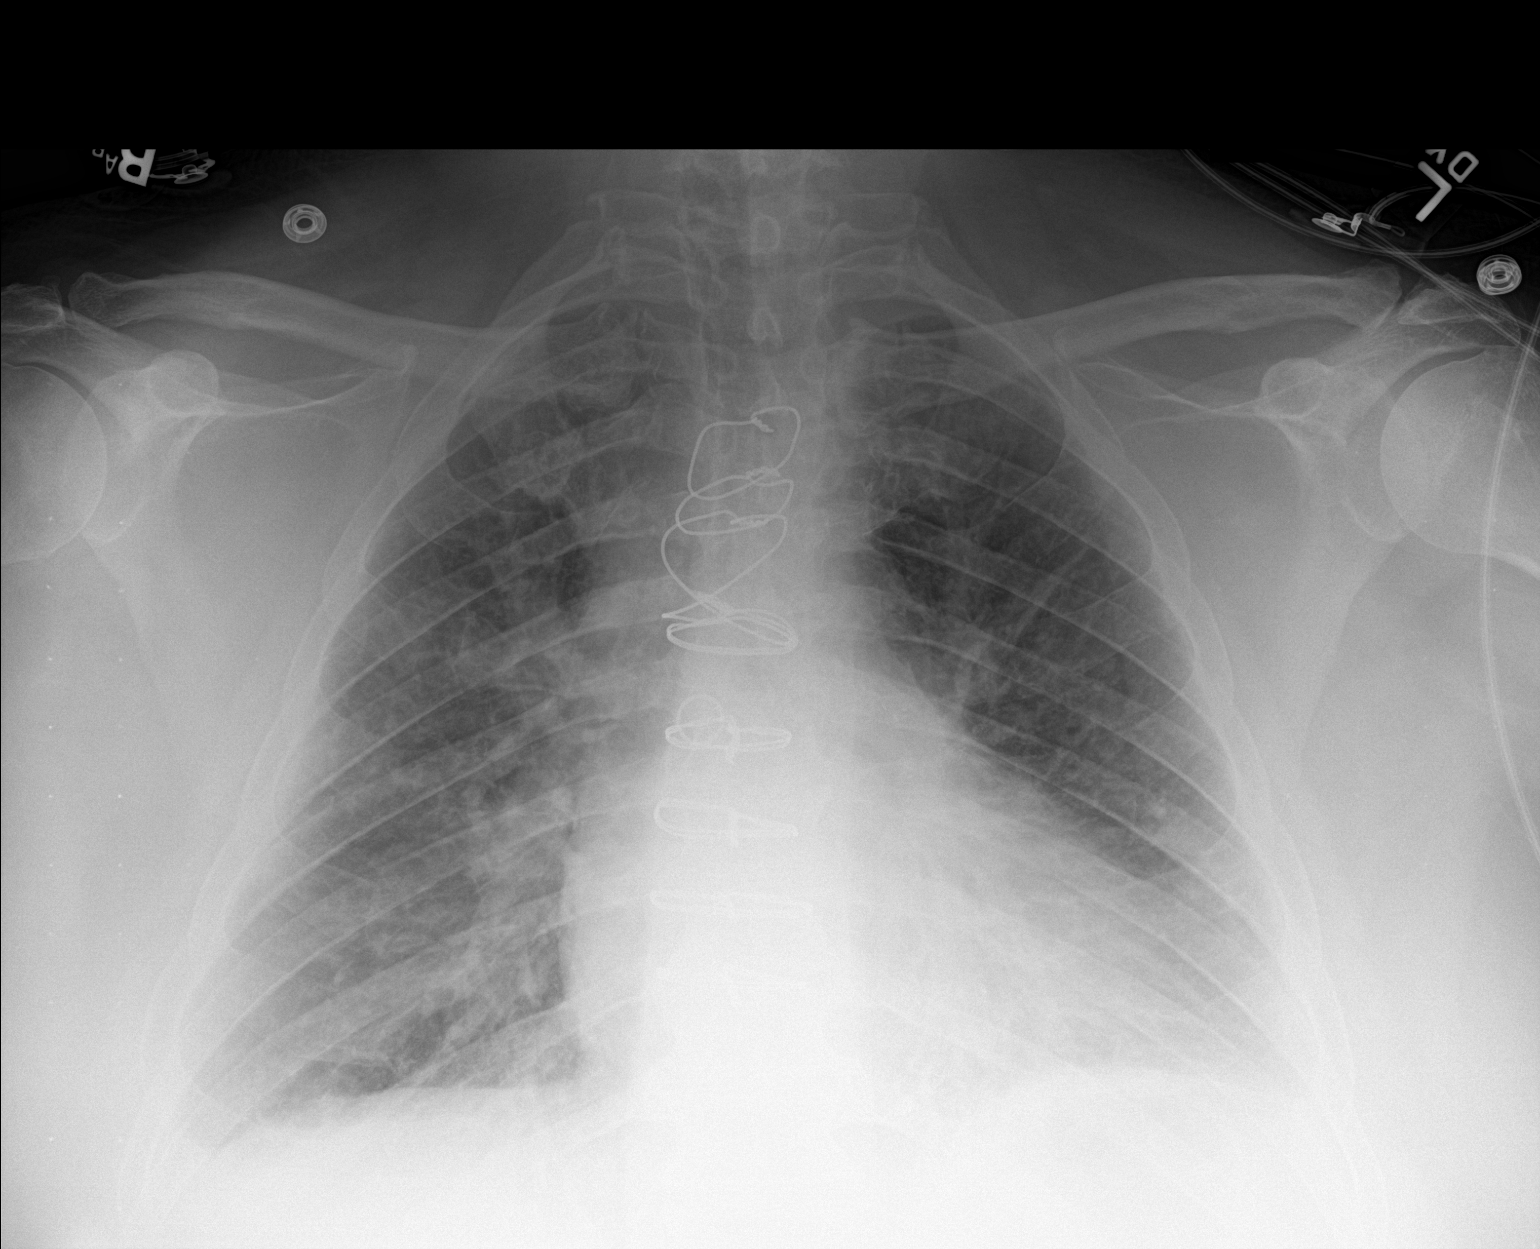

[1 of 1 positions shown; findings below may reference images not displayed]

FINDINGS: The heart is mildly enlarged but stable. The mediastinal and hilar
contours are within normal limits and unchanged. Chronic bronchitic
type interstitial changes from interstitial lung disease. No
definite acute overlying pulmonary process. Could not exclude small
pleural effusions.
IMPRESSION: Chronic lung disease without obvious acute overlying pulmonary
process except for possible small bilateral pleural effusions.

## 2020-01-19 DIAGNOSIS — J449 Chronic obstructive pulmonary disease, unspecified: Secondary | ICD-10-CM | POA: Diagnosis not present

## 2020-01-21 DIAGNOSIS — J439 Emphysema, unspecified: Secondary | ICD-10-CM | POA: Diagnosis not present

## 2020-01-21 DIAGNOSIS — I739 Peripheral vascular disease, unspecified: Secondary | ICD-10-CM | POA: Diagnosis not present

## 2020-01-21 DIAGNOSIS — I6522 Occlusion and stenosis of left carotid artery: Secondary | ICD-10-CM | POA: Diagnosis not present

## 2020-01-21 DIAGNOSIS — E1142 Type 2 diabetes mellitus with diabetic polyneuropathy: Secondary | ICD-10-CM | POA: Diagnosis not present

## 2020-01-21 DIAGNOSIS — I429 Cardiomyopathy, unspecified: Secondary | ICD-10-CM | POA: Diagnosis not present

## 2020-01-21 DIAGNOSIS — I2510 Atherosclerotic heart disease of native coronary artery without angina pectoris: Secondary | ICD-10-CM | POA: Diagnosis not present

## 2020-01-21 DIAGNOSIS — I5020 Unspecified systolic (congestive) heart failure: Secondary | ICD-10-CM | POA: Diagnosis not present

## 2020-01-21 DIAGNOSIS — I214 Non-ST elevation (NSTEMI) myocardial infarction: Secondary | ICD-10-CM | POA: Diagnosis not present

## 2020-01-21 DIAGNOSIS — I9789 Other postprocedural complications and disorders of the circulatory system, not elsewhere classified: Secondary | ICD-10-CM | POA: Diagnosis not present

## 2020-01-23 ENCOUNTER — Other Ambulatory Visit: Payer: Self-pay

## 2020-01-23 ENCOUNTER — Ambulatory Visit
Payer: Medicare HMO | Attending: Student in an Organized Health Care Education/Training Program | Admitting: Student in an Organized Health Care Education/Training Program

## 2020-01-23 ENCOUNTER — Encounter: Payer: Self-pay | Admitting: Student in an Organized Health Care Education/Training Program

## 2020-01-23 VITALS — BP 96/52 | HR 80 | Temp 97.9°F | Resp 16 | Ht 67.0 in | Wt 184.0 lb

## 2020-01-23 DIAGNOSIS — M19011 Primary osteoarthritis, right shoulder: Secondary | ICD-10-CM | POA: Diagnosis not present

## 2020-01-23 DIAGNOSIS — M25512 Pain in left shoulder: Secondary | ICD-10-CM | POA: Insufficient documentation

## 2020-01-23 DIAGNOSIS — I6522 Occlusion and stenosis of left carotid artery: Secondary | ICD-10-CM | POA: Insufficient documentation

## 2020-01-23 DIAGNOSIS — G894 Chronic pain syndrome: Secondary | ICD-10-CM | POA: Diagnosis not present

## 2020-01-23 DIAGNOSIS — I25810 Atherosclerosis of coronary artery bypass graft(s) without angina pectoris: Secondary | ICD-10-CM | POA: Insufficient documentation

## 2020-01-23 DIAGNOSIS — I739 Peripheral vascular disease, unspecified: Secondary | ICD-10-CM | POA: Insufficient documentation

## 2020-01-23 DIAGNOSIS — M25511 Pain in right shoulder: Secondary | ICD-10-CM | POA: Insufficient documentation

## 2020-01-23 DIAGNOSIS — G8929 Other chronic pain: Secondary | ICD-10-CM | POA: Diagnosis not present

## 2020-01-23 DIAGNOSIS — I6523 Occlusion and stenosis of bilateral carotid arteries: Secondary | ICD-10-CM | POA: Diagnosis not present

## 2020-01-23 DIAGNOSIS — I509 Heart failure, unspecified: Secondary | ICD-10-CM | POA: Insufficient documentation

## 2020-01-23 MED ORDER — HYDROCODONE-ACETAMINOPHEN 10-325 MG PO TABS: 1.0000 | ORAL_TABLET | Freq: Two times a day (BID) | ORAL | 0 refills | Status: DC | PRN

## 2020-01-23 NOTE — Progress Notes (Signed)
Nursing Pain Medication Assessment:  Safety precautions to be maintained throughout the outpatient stay will include: orient to surroundings, keep bed in low position, maintain call bell within reach at all times, provide assistance with transfer out of bed and ambulation.  Medication Inspection Compliance: Pill count conducted under aseptic conditions, in front of the patient. Neither the pills nor the bottle was removed from the patient's sight at any time. Once count was completed pills were immediately returned to the patient in their original bottle.  Medication: Hydrocodone/APAP Pill/Patch Count: 21.5 of 75 pills remain Pill/Patch Appearance: Markings consistent with prescribed medication Bottle Appearance: Standard pharmacy container. Clearly labeled. Filled Date: 12/02/2019 Last Medication intake:  [place] Mylar

## 2020-01-23 NOTE — Progress Notes (Signed)
PROVIDER NOTE: Information contained herein reflects review and annotations entered in association with encounter. Interpretation of such information and data should be left to medically-trained personnel. Information provided to patient can be located elsewhere in the medical record under "Patient Instructions". Document created using STT-dictation technology, any transcriptional errors that may result from process are unintentional.    Patient: [name]  Service Category: E/M  Provider: Gillis Santa, MD  DOB: [birth year]  DOS: 01/23/2020  Specialty: Interventional Pain Management  MRN: [id number]  Setting: Ambulatory outpatient  PCP: Tracie Harrier, MD  Type: Established Patient    Referring Provider: Tracie Harrier, MD  Location: Office  Delivery: Face-to-face     HPI  Reason for encounter: [name], a 59 y.o. year old male, is here today for evaluation and management of his Chronic right shoulder pain [M25.511, G89.29]. [name] primary complain today is Shoulder Pain (right) and Knee Pain (bilateral) Last encounter: Practice (11/19/2019). My last encounter with him was on 09/23/2019. Pertinent problems: [name] has PAD (peripheral artery disease) ([place]); Carotid stenosis; Carotid stenosis, symptomatic w/o infarct, left; CAD (coronary artery disease); Chronic pain of both shoulders; S/P CABG (coronary artery bypass graft); DM (diabetes mellitus) ([place]); and Chronic pain syndrome on their pertinent problem list. Pain Assessment: Severity of Chronic pain is reported as a 4 /10. Location: Shoulder Left/denies. Onset: More than a month ago. Quality: Aching, Throbbing, Shooting. Timing: Constant. Modifying factor(s): heat, Hydrocodone. Vitals:  height is '5\' 7"'  (1.702 m) and weight is 184 lb (83.5 kg). His temporal temperature is 97.9 F (36.6 C). His blood pressure is 96/52 (abnormal) and his pulse is 80. His respiration is 16 and oxygen saturation is 97%.    Patient presents today  for medication management.  Overall he is doing well.  He continues to endorse bilateral shoulder pain, right greater than left.  He is planning on going hunting in September and would like to have a repeat right steroid injection done prior to that.  He has had to stop glenohumeral steroid joint injections in the past, posterior approach as well as anterior approach and patient states that he got better benefit with the anterior approach so could consider repeating the anterior injection to the glenohumeral joint.  Otherwise patient continues his medications as prescribed.  No side effects.  Denies any constipation, will refill as below.  Pharmacotherapy Assessment   01/01/2020  2   11/19/2019  Hydrocodone-Acetamin 10-325 MG  75.00  30 Bi Lat   [id number]   Nor (4575)   0  25.00 MME  Medicare   [place]      01/01/2020  2   11/19/2019  Hydrocodone-Acetamin 10-325 MG  75.00  30 Bi Lat   [id number]   [place] (4575)   0  25.00 MME  Medicare   [place]     Monitoring: [place] PMP: PDMP reviewed during this encounter.       Pharmacotherapy: No side-effects or adverse reactions reported. Compliance: No problems identified. Effectiveness: Clinically acceptable.  UDS:  Summary  Date Value Ref Range Status  11/21/2019 Note  Final    Comment:    ==================================================================== Compliance Drug Analysis, Ur ==================================================================== Test                             Result       Flag       Units Drug Present and Declared for Prescription Verification   Hydrocodone  993          EXPECTED   ng/mg creat   Hydromorphone                  507          EXPECTED   ng/mg creat   Norhydrocodone                 2376         EXPECTED   ng/mg creat    Sources of hydrocodone include scheduled prescription medications.    Hydromorphone and norhydrocodone are expected metabolites of    hydrocodone. Hydromorphone is also available as a  scheduled    prescription medication.   Acetaminophen                  PRESENT      EXPECTED Drug Present not Declared for Prescription Verification   Naproxen                       PRESENT      UNEXPECTED Drug Absent but Declared for Prescription Verification   Diclofenac                     Not Detected UNEXPECTED    Topical diclofenac, as indicated in the declared medication list, is    not always detected even when used as directed.   Salicylate                     Not Detected UNEXPECTED    Aspirin, as indicated in the declared medication list, is not always    detected even when used as directed. ==================================================================== Test                      Result    Flag   Units      Ref Range   Creatinine              42               mg/dL      >=20 ==================================================================== Declared Medications:  The flagging and interpretation on this report are based on the  following declared medications.  Unexpected results may arise from  inaccuracies in the declared medications.  **Note: The testing scope of this panel includes these medications:  Hydrocodone  **Note: The testing scope of this panel does not include small to  moderate amounts of these reported medications:  Acetaminophen  Aspirin  Topical Diclofenac  **Note: The testing scope of this panel does not include the  following reported medications:  Albuterol  Carvedilol (Coreg)  Furosemide  Glipizide  Liraglutide (Victoza)  Lisinopril  Metformin  Metolazone  Pirfenidone (Esbriet)  Pravastatin  Sodium Chloride  Spironolactone  Tamsulosin  Tiotropium (Spiriva) ==================================================================== For clinical consultation, please call [phone]. ====================================================================       ROS  Constitutional: Denies any fever or chills Gastrointestinal: No reported  hemesis, hematochezia, vomiting, or acute GI distress Musculoskeletal: Denies any acute onset joint swelling, redness, loss of ROM, or weakness Neurological: No reported episodes of acute onset apraxia, aphasia, dysarthria, agnosia, amnesia, paralysis, loss of coordination, or loss of consciousness  Medication Review  HYDROcodone-acetaminophen, Pirfenidone, acetaminophen, albuterol, aspirin EC, carvedilol, furosemide, liraglutide, lisinopril, metFORMIN, metolazone, pravastatin, sodium chloride, spironolactone, tamsulosin, and tiotropium  History Review  Allergy: Mr. Paci has No Known Allergies. Drug: Mr. Prevette  reports no history of drug use. Alcohol:  reports previous alcohol use. Tobacco:  reports that he quit smoking about 6 years ago. His smoking use included cigarettes. He has never used smokeless tobacco. Social: Mr. Nadeem  reports that he quit smoking about 6 years ago. His smoking use included cigarettes. He has never used smokeless tobacco. He reports previous alcohol use. He reports that he does not use drugs. Medical:  has a past medical history of Cancer ([name]), CHF (congestive heart failure) ([place]), COPD (chronic obstructive pulmonary disease) ([place]), Coronary artery disease, Diabetes mellitus without complication ([place]), Dyspnea, Fibromyalgia, History of kidney stones, MI (myocardial infarction) ([place]), Peripheral vascular disease ([place]), and Pneumonia. Surgical: Mr. Palazzi  has a past surgical history that includes Cardiac catheterization (N/A, 06/09/2015); Cardiac catheterization (06/09/2015); Coronary artery bypass graft (2015); Cardiac catheterization; Application if wound vac; Wound debridement; stent in leg (Left); Endarterectomy (Left, 09/08/2017); and Lower Extremity Angiography (Right, 01/29/2019). Family: family history includes Cancer in his father; Heart attack in his mother.  Laboratory Chemistry Profile   Renal Lab Results  Component Value Date   BUN 21 (H) 01/29/2019    CREATININE 1.01 01/29/2019   GFRAA >60 01/29/2019   GFRNONAA >60 01/29/2019     Hepatic Lab Results  Component Value Date   AST 21 08/17/2017   ALT 20 08/17/2017   ALBUMIN 3.7 08/17/2017   ALKPHOS 44 08/17/2017     Electrolytes Lab Results  Component Value Date   NA 137 07/23/2018   K 4.2 07/23/2018   CL 102 07/23/2018   CALCIUM 8.5 (L) 07/23/2018   MG 2.0 07/23/2018   PHOS 4.4 09/10/2017     Bone No results found for: VD25OH, VD125OH2TOT, [id number], MC9470JG2, 25OHVITD1, 25OHVITD2, 25OHVITD3, TESTOFREE, TESTOSTERONE   Inflammation (CRP: Acute Phase) (ESR: Chronic Phase) Lab Results  Component Value Date   LATICACIDVEN 1.4 09/09/2017       Note: Above Lab results reviewed.  Recent Imaging Review  VAS [place] LOWER EXTREMITY ARTERIAL DUPLEX LOWER EXTREMITY ARTERIAL DUPLEX STUDY   Current ABI: Right = 1.28 Left = .91  Comparison Study: 05/20/2019  Performing Technologist: Charlane Ferretti RT (R)(VS)    Examination Guidelines: A complete evaluation includes B-mode imaging, spectral Doppler, color Doppler, and power Doppler as needed of all accessible portions of each vessel. Bilateral testing is considered an integral part of a complete examination. Limited examinations for reoccurring indications may be performed as noted.    +-----------+--------+-----+--------+----------+-----------------------+ RIGHT      PSV cm/sRatioStenosisWaveform  Comments                +-----------+--------+-----+--------+----------+-----------------------+ CFA Distal 240                  triphasic                         +-----------+--------+-----+--------+----------+-----------------------+ DFA        241                  triphasic                         +-----------+--------+-----+--------+----------+-----------------------+ SFA Prox   172                  triphasic Stent                    +-----------+--------+-----+--------+----------+-----------------------+ SFA Mid    241  triphasic Stent / Diameter change +-----------+--------+-----+--------+----------+-----------------------+ SFA Distal 122                  biphasic  Stent                   +-----------+--------+-----+--------+----------+-----------------------+ POP Prox   137                  biphasic  Stent                   +-----------+--------+-----+--------+----------+-----------------------+ POP Distal 98                   biphasic                          +-----------+--------+-----+--------+----------+-----------------------+ ATA Distal 65                   biphasic                          +-----------+--------+-----+--------+----------+-----------------------+ PTA Distal 45                   biphasic                          +-----------+--------+-----+--------+----------+-----------------------+ PERO Distal9                    monophasic                        +-----------+--------+-----+--------+----------+-----------------------+    Right Stent(s): +--------------+---+++---------------------+ Proximal Stent172                      +--------------+---+++---------------------+ Mid Stent     241Change in diameter137 +--------------+---+++---------------------+ Distal Stent  121                      +--------------+---+++---------------------+    Summary: Right: 50-74% stenosis noted in the superficial femoral artery. Patent right SFA and popliteal stent with elevated velocities possibly due to change in diameter in the mid SFA. No change from the previous exam on 05/20/2019.    See table(s) above for measurements and observations.  Electronically signed by Hortencia Pilar MD on 12/02/2019 at 5:17:36 PM.       Final   VAS [place] ABI WITH/WO TBI LOWER EXTREMITY DOPPLER STUDY  Indications: Peripheral artery  disease.  Other Factors: 01/29/2019 right atherectomy. sfa-pop stent and pta of eia.  Vascular Interventions: 05/14/13: Right CIA & left EIA stents with right CFA                         PTAs;                         06/17/15: Right CFA PTA;                         01/29/2019 PTA and stent right SFA and popliteal artery.                         PTA right EIA.  Comparison Study: 05/20/2019  Performing Technologist: Charlane Ferretti RT (R)(VS)    Examination Guidelines: A complete evaluation includes at minimum, Doppler waveform signals and systolic blood  pressure reading at the level of bilateral brachial, anterior tibial, and posterior tibial arteries, when vessel segments are accessible. Bilateral testing is considered an integral part of a complete examination. Photoelectric Plethysmograph (PPG) waveforms and toe systolic pressure readings are included as required and additional duplex testing as needed. Limited examinations for reoccurring indications may be performed as noted.    ABI Findings: +---------+------------------+-----+--------+--------+ Right    Rt Pressure (mmHg)IndexWaveformComment  +---------+------------------+-----+--------+--------+ Brachial 101                                     +---------+------------------+-----+--------+--------+ ATA      136               1.07 biphasic         +---------+------------------+-----+--------+--------+ PTA      162               1.28 biphasic         +---------+------------------+-----+--------+--------+ Great Toe117               0.92 Dampened         +---------+------------------+-----+--------+--------+  +---------+------------------+-----+----------+-------+ Left     Lt Pressure (mmHg)IndexWaveform  Comment +---------+------------------+-----+----------+-------+ Brachial 127                                      +---------+------------------+-----+----------+-------+ ATA       104               0.82 monophasic        +---------+------------------+-----+----------+-------+ PTA      115               0.91 monophasic        +---------+------------------+-----+----------+-------+ Great Toe90                0.71 Abnormal          +---------+------------------+-----+----------+-------+  +-------+-----------+-----------+------------+------------+ ABI/TBIToday's ABIToday's TBIPrevious ABIPrevious TBI +-------+-----------+-----------+------------+------------+ Right  1.28       .92        1.56        1.03         +-------+-----------+-----------+------------+------------+ Left   .91        .71        .88         .74          +-------+-----------+-----------+------------+------------+ Bilateral ABIs appear essentially unchanged compared to prior study on 05/20/2019. Bilateral TBIs appear essentially unchanged compared to prior study on 05/20/2019. Summary: Right: Resting right ankle-brachial index is within normal range. No evidence of significant right lower extremity arterial disease. The right toe-brachial index is normal.  Left: Resting left ankle-brachial index indicates mild left lower extremity arterial disease. The left toe-brachial index is normal.  *See table(s) above for measurements and observations.    Electronically signed by Hortencia Pilar MD on 12/02/2019 at 5:17:31 PM.    Final   Note: Reviewed        Physical Exam  General appearance: Well nourished, well developed, and well hydrated. In no apparent acute distress Mental status: Alert, oriented x 3 (person, place, & time)       Respiratory: No evidence of acute respiratory distress Eyes: PERLA Vitals: BP (!) 96/52   Pulse 80   Temp 97.9 F (36.6 C) (Temporal)   Resp 16   Ht 5'  7" (1.702 m)   Wt 184 lb (83.5 kg)   SpO2 97%   BMI 28.82 kg/m  BMI: Estimated body mass index is 28.82 kg/m as calculated from the following:   Height as of this encounter: 5'  7" (1.702 m).   Weight as of this encounter: 184 lb (83.5 kg). Ideal: Ideal body weight: 66.1 kg (145 lb 11.6 oz) Adjusted ideal body weight: 73 kg (161 lb 0.6 oz)  Cervical Spine Area Exam  Skin & Axial Inspection: No masses, redness, edema, swelling, or associated skin lesions Alignment: Symmetrical Functional ROM: Unrestricted ROM      Stability: No instability detected Muscle Tone/Strength: Functionally intact. No obvious neuro-muscular anomalies detected. Sensory (Neurological): Unimpaired   Upper Extremity (UE) Exam    Side: Right upper extremity  Side: Left upper extremity  Skin & Extremity Inspection: Skin color, temperature, and hair growth are WNL. No peripheral edema or cyanosis. No masses, redness, swelling, asymmetry, or associated skin lesions. No contractures.  Skin & Extremity Inspection: Skin color, temperature, and hair growth are WNL. No peripheral edema or cyanosis. No masses, redness, swelling, asymmetry, or associated skin lesions. No contractures.  Functional ROM: Pain restricted ROM for shoulder  Functional ROM: Unrestricted ROM          Muscle Tone/Strength: Functionally intact. No obvious neuro-muscular anomalies detected.  Muscle Tone/Strength: Functionally intact. No obvious neuro-muscular anomalies detected.  Sensory (Neurological): Arthropathic arthralgia          Sensory (Neurological): Unimpaired          Palpation: No palpable anomalies              Palpation: No palpable anomalies              Provocative Test(s):  Phalen's test: deferred Tinel's test: deferred Apley's scratch test (touch opposite shoulder):  Action 1 (Across chest): Decreased ROM Action 2 (Overhead): Decreased ROM Action 3 (LB reach): Decreased ROM   Provocative Test(s):  Phalen's test: deferred Tinel's test: deferred Apley's scratch test (touch opposite shoulder):  Action 1 (Across chest): deferred Action 2 (Overhead): deferred Action 3 (LB reach): deferred     Assessment    Status Diagnosis  Persistent Controlled Controlled 1. Chronic right shoulder pain   2. Chronic pain syndrome   3. Primary osteoarthritis of right shoulder   4. Chronic pain of both shoulders   5. Congestive heart failure, unspecified HF chronicity, unspecified heart failure type ([place])   6. Carotid stenosis, symptomatic w/o infarct, left   7. Bilateral carotid artery stenosis   8. PAD (peripheral artery disease) ([place])   9. Coronary artery disease involving other coronary artery bypass graft, angina presence unspecified      Updated Problems: Problem  Chronic Pain Syndrome  Chronic Pain of Both Shoulders  Carotid Stenosis, Symptomatic W/O Infarct, Left  Carotid Stenosis  Pad (Peripheral Artery Disease) ([facility])  S/P Cabg (Coronary Artery Bypass Graft)   Overview:  CABG x 3 5/18   Cad (Coronary Artery Disease)  DM (Diabetes Mellitus) ([facility])  Primary Osteoarthritis of Right Shoulder  Congestive Heart Failure ([facility])    Plan of Care  Mr. LOWELL MAKARA has a current medication list which includes the following long-term medication(s): albuterol, furosemide, metformin, metolazone, pravastatin, sodium chloride, carvedilol, spironolactone, tiotropium, and victoza.  1.  Refill hydrocodone as below 2.  Schedule for right glenohumeral steroid joint injection via anterior approach under fluoroscopy 3.  We had extensive discussion regarding Sprint axillary nerve peripheral nerve stimulation for right shoulder  pain.  Patient was provided resources regarding this therapy.  He will think about this further and let me know if he would like to proceed.  Pharmacotherapy (Medications Ordered): Meds ordered this encounter  Medications  . HYDROcodone-acetaminophen (NORCO) 10-325 MG tablet    Sig: Take 1-2 tablets by mouth 2 (two) times daily as needed for severe pain. Must last 30 days.    Dispense:  75 tablet    Refill:  0    Chronic Pain. (STOP Act - Not applicable). Fill one day early if  closed on scheduled refill date.  Marland Kitchen HYDROcodone-acetaminophen (NORCO) 10-325 MG tablet    Sig: Take 1-2 tablets by mouth 2 (two) times daily as needed for severe pain. Must last 30 days.    Dispense:  75 tablet    Refill:  0    Chronic Pain. (STOP Act - Not applicable). Fill one day early if closed on scheduled refill date.  Marland Kitchen HYDROcodone-acetaminophen (NORCO) 10-325 MG tablet    Sig: Take 1-2 tablets by mouth 2 (two) times daily as needed for severe pain. Must last 30 days.    Dispense:  75 tablet    Refill:  0    Chronic Pain. (STOP Act - Not applicable). Fill one day early if closed on scheduled refill date.   Orders:  Orders Placed This Encounter  Procedures  . SHOULDER INJECTION    Standing Status:   Future    Standing Expiration Date:   04/24/2020    Scheduling Instructions:     Side: RIGHT     Sedation: Patient's choice.     Timeframe: As soon as schedule allows    Order Specific Question:   Where will this procedure be performed?    Answer:   [facility]    Comments:   Jericka Kadar   Follow-up plan:   Return in about 3 months (around 04/24/2020) for Medication Management, in person.     s/p R GH shoulder joint injection 04/03/19, 08/26/19 (anterior approach)- repeat prn         Recent Visits Date Type Provider Dept  11/19/19 Telemedicine Gillis Santa, MD [facility]  Showing recent visits within past 90 days and meeting all other requirements Today's Visits Date Type Provider Dept  01/23/20 Office Visit Gillis Santa, MD [facility]  Showing today's visits and meeting all other requirements Future Appointments Date Type Provider Dept  03/23/20 Appointment Gillis Santa, MD [facility]  Showing future appointments within next 90 days and meeting all other requirements  I discussed the assessment and treatment plan with the patient. The patient was provided an opportunity to ask questions and all were answered. The patient agreed with the  plan and demonstrated an understanding of the instructions.  Patient advised to call back or seek an in-person evaluation if the symptoms or condition worsens.  Duration of encounter:6mnutes.  Note by: BGillis Santa MD Date: 01/23/2020; Time: 12:44 PM

## 2020-01-23 NOTE — Patient Instructions (Addendum)
____________________________________________________________________________________________  Preparing for your procedure (without sedation)  Procedure appointments are limited to planned procedures: . No Prescription Refills. . No disability issues will be discussed. . No medication changes will be discussed.  Instructions: . Oral Intake: Do not eat or drink anything for at least 6 hours prior to your procedure. (Exception: Blood Pressure Medication. See below.) . Transportation: Unless otherwise stated by your physician, you may drive yourself after the procedure. . Blood Pressure Medicine: Do not forget to take your blood pressure medicine with a sip of water the morning of the procedure. If your Diastolic (lower reading)is above 100 mmHg, elective cases will be cancelled/rescheduled. . Blood thinners: These will need to be stopped for procedures. Notify our staff if you are taking any blood thinners. Depending on which one you take, there will be specific instructions on how and when to stop it. . Diabetics on insulin: Notify the staff so that you can be scheduled 1st case in the morning. If your diabetes requires high dose insulin, take only  of your normal insulin dose the morning of the procedure and notify the staff that you have done so. . Preventing infections: Shower with an antibacterial soap the morning of your procedure.  . Build-up your immune system: Take 1000 mg of Vitamin C with every meal (3 times a day) the day prior to your procedure. . Antibiotics: Inform the staff if you have a condition or reason that requires you to take antibiotics before dental procedures. . Pregnancy: If you are pregnant, call and cancel the procedure. . Sickness: If you have a cold, fever, or any active infections, call and cancel the procedure. . Arrival: You must be in the facility at least 30 minutes prior to your scheduled procedure. . Children: Do not bring any children with you. . Dress  appropriately: Bring dark clothing that you would not mind if they get stained. . Valuables: Do not bring any jewelry or valuables.  Reasons to call and reschedule or cancel your procedure: (Following these recommendations will minimize the risk of a serious complication.) . Surgeries: Avoid having procedures within 2 weeks of any surgery. (Avoid for 2 weeks before or after any surgery). . Flu Shots: Avoid having procedures within 2 weeks of a flu shots or . (Avoid for 2 weeks before or after immunizations). . Barium: Avoid having a procedure within 7-10 days after having had a radiological study involving the use of radiological contrast. (Myelograms, Barium swallow or enema study). . Heart attacks: Avoid any elective procedures or surgeries for the initial 6 months after a "Myocardial Infarction" (Heart Attack). . Blood thinners: It is imperative that you stop these medications before procedures. Let us know if you if you take any blood thinner.  . Infection: Avoid procedures during or within two weeks of an infection (including chest colds or gastrointestinal problems). Symptoms associated with infections include: Localized redness, fever, chills, night sweats or profuse sweating, burning sensation when voiding, cough, congestion, stuffiness, runny nose, sore throat, diarrhea, nausea, vomiting, cold or Flu symptoms, recent or current infections. It is specially important if the infection is over the area that we intend to treat. . Heart and lung problems: Symptoms that may suggest an active cardiopulmonary problem include: cough, chest pain, breathing difficulties or shortness of breath, dizziness, ankle swelling, uncontrolled high or unusually low blood pressure, and/or palpitations. If you are experiencing any of these symptoms, cancel your procedure and contact your primary care physician for an evaluation.  Remember:  Regular   Business hours are:  Monday to Thursday 8:00 AM to 4:00  PM  Provider's Schedule: Milinda Pointer, MD:  Procedure days: Tuesday and Thursday 7:30 AM to 4:00 PM  Gillis Santa, MD:  Procedure days: Monday and Wednesday 7:30 AM to 4:00 PM ____________________________________________________________________________________________ Three prescriptions for Hydrocodone have been sent to your pharmacy.

## 2020-01-26 DIAGNOSIS — J449 Chronic obstructive pulmonary disease, unspecified: Secondary | ICD-10-CM | POA: Diagnosis not present

## 2020-01-26 DIAGNOSIS — J9601 Acute respiratory failure with hypoxia: Secondary | ICD-10-CM | POA: Diagnosis not present

## 2020-02-26 DIAGNOSIS — J449 Chronic obstructive pulmonary disease, unspecified: Secondary | ICD-10-CM | POA: Diagnosis not present

## 2020-02-26 DIAGNOSIS — J9601 Acute respiratory failure with hypoxia: Secondary | ICD-10-CM | POA: Diagnosis not present

## 2020-03-17 DIAGNOSIS — J439 Emphysema, unspecified: Secondary | ICD-10-CM | POA: Diagnosis not present

## 2020-03-17 DIAGNOSIS — Z951 Presence of aortocoronary bypass graft: Secondary | ICD-10-CM | POA: Diagnosis not present

## 2020-03-17 DIAGNOSIS — J9611 Chronic respiratory failure with hypoxia: Secondary | ICD-10-CM | POA: Diagnosis not present

## 2020-03-17 DIAGNOSIS — K219 Gastro-esophageal reflux disease without esophagitis: Secondary | ICD-10-CM | POA: Diagnosis not present

## 2020-03-17 DIAGNOSIS — E1142 Type 2 diabetes mellitus with diabetic polyneuropathy: Secondary | ICD-10-CM | POA: Diagnosis not present

## 2020-03-17 DIAGNOSIS — I2510 Atherosclerotic heart disease of native coronary artery without angina pectoris: Secondary | ICD-10-CM | POA: Diagnosis not present

## 2020-03-17 DIAGNOSIS — E1165 Type 2 diabetes mellitus with hyperglycemia: Secondary | ICD-10-CM | POA: Diagnosis not present

## 2020-03-17 DIAGNOSIS — J84112 Idiopathic pulmonary fibrosis: Secondary | ICD-10-CM | POA: Diagnosis not present

## 2020-03-17 DIAGNOSIS — I739 Peripheral vascular disease, unspecified: Secondary | ICD-10-CM | POA: Diagnosis not present

## 2020-03-23 ENCOUNTER — Ambulatory Visit
Admission: RE | Admit: 2020-03-23 | Discharge: 2020-03-23 | Disposition: A | Discharge status: Home / Self Care | Payer: Medicare HMO | Source: Ambulatory Visit | Attending: Student in an Organized Health Care Education/Training Program | Admitting: Student in an Organized Health Care Education/Training Program

## 2020-03-23 ENCOUNTER — Other Ambulatory Visit: Payer: Self-pay

## 2020-03-23 ENCOUNTER — Encounter: Payer: Self-pay | Admitting: Student in an Organized Health Care Education/Training Program

## 2020-03-23 ENCOUNTER — Ambulatory Visit (HOSPITAL_BASED_OUTPATIENT_CLINIC_OR_DEPARTMENT_OTHER): Payer: Medicare HMO | Admitting: Student in an Organized Health Care Education/Training Program

## 2020-03-23 VITALS — BP 127/89 | HR 83 | Temp 97.2°F | Resp 20 | Ht 67.0 in | Wt 184.0 lb

## 2020-03-23 DIAGNOSIS — G8929 Other chronic pain: Secondary | ICD-10-CM | POA: Diagnosis not present

## 2020-03-23 DIAGNOSIS — G894 Chronic pain syndrome: Secondary | ICD-10-CM | POA: Diagnosis not present

## 2020-03-23 DIAGNOSIS — M19011 Primary osteoarthritis, right shoulder: Secondary | ICD-10-CM | POA: Insufficient documentation

## 2020-03-23 DIAGNOSIS — M25511 Pain in right shoulder: Secondary | ICD-10-CM

## 2020-03-23 MED ORDER — IOHEXOL 180 MG/ML  SOLN
10.0000 mL | Freq: Once | INTRAMUSCULAR | Status: AC
  Administered 2020-03-23: 10 mL via INTRA_ARTICULAR

## 2020-03-23 MED ORDER — LIDOCAINE HCL 2 % IJ SOLN
INTRAMUSCULAR | Status: AC
  Filled 2020-03-23: qty 10

## 2020-03-23 MED ORDER — LIDOCAINE HCL 2 % IJ SOLN
20.0000 mL | Freq: Once | INTRAMUSCULAR | Status: AC
  Administered 2020-03-23: 200 mg

## 2020-03-23 MED ORDER — METHYLPREDNISOLONE ACETATE 40 MG/ML IJ SUSP
40.0000 mg | Freq: Once | INTRAMUSCULAR | Status: AC
  Administered 2020-03-23: 40 mg via INTRA_ARTICULAR

## 2020-03-23 MED ORDER — METHYLPREDNISOLONE ACETATE 40 MG/ML IJ SUSP
INTRAMUSCULAR | Status: AC
  Filled 2020-03-23: qty 1

## 2020-03-23 MED ORDER — IOHEXOL 180 MG/ML  SOLN
INTRAMUSCULAR | Status: AC
  Filled 2020-03-23: qty 20

## 2020-03-23 MED ORDER — ROPIVACAINE HCL 2 MG/ML IJ SOLN
4.0000 mL | Freq: Once | INTRAMUSCULAR | Status: AC
  Administered 2020-03-23: 10 mL via INTRA_ARTICULAR

## 2020-03-23 MED ORDER — ROPIVACAINE HCL 2 MG/ML IJ SOLN
INTRAMUSCULAR | Status: AC
  Filled 2020-03-23: qty 10

## 2020-03-23 NOTE — Progress Notes (Signed)
PROVIDER NOTE: Information contained herein reflects review and annotations entered in association with encounter. Interpretation of such information and data should be left to medically-trained personnel. Information provided to patient can be located elsewhere in the medical record under "Patient Instructions". Document created using STT-dictation technology, any transcriptional errors that may result from process are unintentional.    Patient: [name]  Service Category: Procedure  Provider: Gillis Santa, MD  DOB: Mar 06, 1961  DOS: 03/23/2020  Location: [facility]  MRN: [id number]  Setting: Ambulatory - outpatient  Referring Provider: Tracie Harrier, MD  Type: Established Patient  Specialty: Interventional Pain Management  PCP: Tracie Harrier, MD   Primary Reason for Visit: Interventional Pain Management Treatment. CC: Shoulder Pain (right )  Procedure:          Anesthesia, Analgesia, Anxiolysis:  Type: Therapeutic Glenohumeral Joint (shoulder) Injection #3  Primary Purpose: Diagnostic/Therapeutric Region: Anterior Shoulder Area Level:  Shoulder Target Area: Glenohumeral Joint (shoulder) Approach: Anterior approach. Laterality: Right  Type: Local Anesthesia  Local Anesthetic: Lidocaine 1-2%  Position: Supine   Indications: 1. Chronic right shoulder pain   2. Primary osteoarthritis of right shoulder   3. Chronic pain syndrome    Pain Score: Pre-procedure: 5 /10 Post-procedure: 3 /10   Pre-op Assessment:  [name] is a 59 y.o. (year old), male patient, seen today for interventional treatment. He  has a past surgical history that includes Cardiac catheterization (N/A, 06/09/2015); Cardiac catheterization (06/09/2015); Coronary artery bypass graft (2015); Cardiac catheterization; Application if wound vac; Wound debridement; stent in leg (Left); Endarterectomy (Left, 09/08/2017); and Lower Extremity Angiography (Right, 01/29/2019). [name] has a current  medication list which includes the following prescription(s): acetaminophen, albuterol, aspirin ec, carvedilol, furosemide, hydrocodone-acetaminophen, [START ON 03/30/2020] hydrocodone-acetaminophen, metformin, metolazone, esbriet, pravastatin, sodium chloride, tamsulosin, lisinopril, spironolactone, tiotropium, and victoza. His primarily concern today is the Shoulder Pain (right )  Initial Vital Signs:  Pulse/HCG Rate: 83ECG Heart Rate: 82 (nsr) Temp: (!) 97.2 F (36.2 C) Resp: 16 BP: 114/67 SpO2: 98 %  BMI: Estimated body mass index is 28.82 kg/m as calculated from the following:   Height as of this encounter: 5\' 7"  (1.702 m).   Weight as of this encounter: 184 lb (83.5 kg).  Risk Assessment: Allergies: Reviewed. He has No Known Allergies.  Allergy Precautions: None required Coagulopathies: Reviewed. None identified.  Blood-thinner therapy: None at this time Active Infection(s): Reviewed. None identified. [name] is afebrile  Site Confirmation: [name] was asked to confirm the procedure and laterality before marking the site Procedure checklist: Completed Consent: Before the procedure and under the influence of no sedative(s), amnesic(s), or anxiolytics, the patient was informed of the treatment options, risks and possible complications. To fulfill our ethical and legal obligations, as recommended by the American Medical Association's Code of Ethics, I have informed the patient of my clinical impression; the nature and purpose of the treatment or procedure; the risks, benefits, and possible complications of the intervention; the alternatives, including doing nothing; the risk(s) and benefit(s) of the alternative treatment(s) or procedure(s); and the risk(s) and benefit(s) of doing nothing. The patient was provided information about the general risks and possible complications associated with the procedure. These may include, but are not limited to: failure to achieve desired goals,  infection, bleeding, organ or nerve damage, allergic reactions, paralysis, and death. In addition, the patient was informed of those risks and complications associated to the procedure, such as failure to decrease pain; infection; bleeding; organ or nerve damage  with subsequent damage to sensory, motor, and/or autonomic systems, resulting in permanent pain, numbness, and/or weakness of one or several areas of the body; allergic reactions; (i.e.: anaphylactic reaction); and/or death. Furthermore, the patient was informed of those risks and complications associated with the medications. These include, but are not limited to: allergic reactions (i.e.: anaphylactic or anaphylactoid reaction(s)); adrenal axis suppression; blood sugar elevation that in diabetics may result in ketoacidosis or comma; water retention that in patients with history of congestive heart failure may result in shortness of breath, pulmonary edema, and decompensation with resultant heart failure; weight gain; swelling or edema; medication-induced neural toxicity; particulate matter embolism and blood vessel occlusion with resultant organ, and/or nervous system infarction; and/or aseptic necrosis of one or more joints. Finally, the patient was informed that Medicine is not an exact science; therefore, there is also the possibility of unforeseen or unpredictable risks and/or possible complications that may result in a catastrophic outcome. The patient indicated having understood very clearly. We have given the patient no guarantees and we have made no promises. Enough time was given to the patient to ask questions, all of which were answered to the patient's satisfaction. [name] has indicated that he wanted to continue with the procedure. Attestation: I, the ordering provider, attest that I have discussed with the patient the benefits, risks, side-effects, alternatives, likelihood of achieving goals, and potential problems during recovery for  the procedure that I have provided informed consent. Date  Time: 03/23/2020  8:54 AM  Pre-Procedure Preparation:  Monitoring: As per clinic protocol. Respiration, ETCO2, SpO2, BP, heart rate and rhythm monitor placed and checked for adequate function Safety Precautions: Patient was assessed for positional comfort and pressure points before starting the procedure. Time-out: I initiated and conducted the "Time-out" before starting the procedure, as per protocol. The patient was asked to participate by confirming the accuracy of the "Time Out" information. Verification of the correct person, site, and procedure were performed and confirmed by me, the nursing staff, and the patient. "Time-out" conducted as per Joint Commission's Universal Protocol (UP.01.01.01). Time: 0937  Description of Procedure:          Area Prepped: Entire shoulder Area Prepping solution: DuraPrep (Iodine Povacrylex [0.7% available iodine] and Isopropyl Alcohol, 74% w/w) Safety Precautions: Aspiration looking for blood return was conducted prior to all injections. At no point did we inject any substances, as a needle was being advanced. No attempts were made at seeking any paresthesias. Safe injection practices and needle disposal techniques used. Medications properly checked for expiration dates. SDV (single dose vial) medications used. Description of the Procedure: Protocol guidelines were followed. The patient was placed in position over the procedure table. The target area was identified and the area prepped in the usual manner. Skin & deeper tissues infiltrated with local anesthetic. Appropriate amount of time allowed to pass for local anesthetics to take effect. The procedure needles were then advanced to the target area. Proper needle placement secured. Negative aspiration confirmed. Solution injected in intermittent fashion, asking for systemic symptoms every 0.5cc of injectate. The needles were then removed and the area  cleansed, making sure to leave some of the prepping solution back to take advantage of its long term bactericidal properties.    Vitals:   03/23/20 0858 03/23/20 0940 03/23/20 0943  BP: 114/67 110/87 127/89  Pulse: 83    Resp: 16 20 20   Temp: (!) 97.2 F (36.2 C)    TempSrc: Temporal    SpO2: 98% 97% 99%  Weight: 184 lb (83.5 kg)    Height: 5\' 7"  (1.702 m)      Start Time: 0938 hrs. End Time: 0942 hrs. Materials:  Needle(s) Type: Spinal Needle Gauge: 22G Length: 3.5-in Medication(s): Please see orders for medications and dosing details. 6 cc solution made of 5 cc of 0.2% ropivacaine, 1 cc of Decadron 10 mg/cc.  This was injected into the right glenohumeral joint under fluoroscopy after contrast confirmation. Imaging Guidance (Non-Spinal):          Type of Imaging Technique: Fluoroscopy Guidance (Non-Spinal) Indication(s): Assistance in needle guidance and placement for procedures requiring needle placement in or near specific anatomical locations not easily accessible without such assistance. Exposure Time: Please see nurses notes. Contrast: Before injecting any contrast, we confirmed that the patient did not have an allergy to iodine, shellfish, or radiological contrast. Once satisfactory needle placement was completed at the desired level, radiological contrast was injected. Contrast injected under live fluoroscopy. No contrast complications. See chart for type and volume of contrast used. Fluoroscopic Guidance: I was personally present during the use of fluoroscopy. "Tunnel Vision Technique" used to obtain the best possible view of the target area. Parallax error corrected before commencing the procedure. "Direction-depth-direction" technique used to introduce the needle under continuous pulsed fluoroscopy. Once target was reached, antero-posterior, oblique, and lateral fluoroscopic projection used confirm needle placement in all planes. Images permanently stored in  EMR. Interpretation: I personally interpreted the imaging intraoperatively. Adequate needle placement confirmed in multiple planes. Appropriate spread of contrast into desired area was observed. No evidence of afferent or efferent intravascular uptake. Permanent images saved into the patient's record.  Antibiotic Prophylaxis:   Anti-infectives (From admission, onward)   None     Indication(s): None identified  Post-operative Assessment:  Post-procedure Vital Signs:  Pulse/HCG Rate: 8380 (nsr) Temp: (!) 97.2 F (36.2 C) Resp: 20 BP: 127/89 SpO2: 99 %  EBL: None  Complications: No immediate post-treatment complications observed by team, or reported by patient.  Note: The patient tolerated the entire procedure well. A repeat set of vitals were taken after the procedure and the patient was kept under observation following institutional policy, for this type of procedure. Post-procedural neurological assessment was performed, showing return to baseline, prior to discharge. The patient was provided with post-procedure discharge instructions, including a section on how to identify potential problems. Should any problems arise concerning this procedure, the patient was given instructions to immediately contact us, at any time, without hesitation. In any case, we plan to contact the patient by telephone for a follow-up status report regarding this interventional procedure.  Comments:  No additional relevant information.  Plan: Patient is status post 3 right glenohumeral steroid injections.  He is found more benefit with the anterior approach.  Today was his second anterior guided glenohumeral steroid joint injection.  We have also discussed Sprint peripheral nerve stimulation of right axillary nerve for persistent shoulder pain and rotator cuff dysfunction.  Again this procedure was discussed in great detail.  Patient would like to proceed with right Sprint PNS axillary nerve therapy.  We will  place order and hopefully do this in about 4 weeks.  Medications ordered for procedure: Meds ordered this encounter  Medications  . iohexol (OMNIPAQUE) 180 MG/ML injection 10 mL    Must be Myelogram-compatible. If not available, you may substitute with a water-soluble, non-ionic, hypoallergenic, myelogram-compatible radiological contrast medium.  Marland Kitchen lidocaine (XYLOCAINE) 2 % (with pres) injection 400 mg  . ropivacaine (PF) 2 mg/mL (0.2%) (NAROPIN)  injection 4 mL  . methylPREDNISolone acetate (DEPO-MEDROL) injection 40 mg   Medications administered: We administered iohexol, lidocaine, ropivacaine (PF) 2 mg/mL (0.2%), and methylPREDNISolone acetate.  See the medical record for exact dosing, route, and time of administration.  Follow-up plan:   Return in about 4 weeks (around 04/20/2020) for Right Axillary PNS (40 mins) PO Valium.      s/p R GH shoulder joint injection 04/03/19, 08/26/19 (anterior approach), 03/23/2020- repeat prn; right axillary nerve PNS     Recent Visits Date Type Provider Dept  01/23/20 Office Visit Gillis Santa, MD [facility]  Showing recent visits within past 90 days and meeting all other requirements Today's Visits Date Type Provider Dept  03/23/20 Procedure visit Gillis Santa, MD [facility]  Showing today's visits and meeting all other requirements Future Appointments Date Type Provider Dept  04/23/20 Appointment Gillis Santa, MD [facility]  Showing future appointments within next 90 days and meeting all other requirements  Disposition: Discharge home  Discharge (Date  Time): 03/23/2020; 0950 hrs.   Primary Care Physician: Tracie Harrier, MD Location: [facility] Note by: Gillis Santa, MD Date: 03/23/2020; Time: 12:13 PM  Disclaimer:  Medicine is not an exact science. The only guarantee in medicine is that nothing is guaranteed. It is important to note that the decision to proceed with this  intervention was based on the information collected from the patient. The Data and conclusions were drawn from the patient's questionnaire, the interview, and the physical examination. Because the information was provided in large part by the patient, it cannot be guaranteed that it has not been purposely or unconsciously manipulated. Every effort has been made to obtain as much relevant data as possible for this evaluation. It is important to note that the conclusions that lead to this procedure are derived in large part from the available data. Always take into account that the treatment will also be dependent on availability of resources and existing treatment guidelines, considered by other Pain Management Practitioners as being common knowledge and practice, at the time of the intervention. For Medico-Legal purposes, it is also important to point out that variation in procedural techniques and pharmacological choices are the acceptable norm. The indications, contraindications, technique, and results of the above procedure should only be interpreted and judged by a Board-Certified Interventional Pain Specialist with extensive familiarity and expertise in the same exact procedure and technique.

## 2020-03-23 NOTE — Progress Notes (Signed)
Safety precautions to be maintained throughout the outpatient stay will include: orient to surroundings, keep bed in low position, maintain call bell within reach at all times, provide assistance with transfer out of bed and ambulation.  

## 2020-03-23 NOTE — Patient Instructions (Signed)

## 2020-03-24 ENCOUNTER — Telehealth: Payer: Self-pay

## 2020-03-24 DIAGNOSIS — J449 Chronic obstructive pulmonary disease, unspecified: Secondary | ICD-10-CM | POA: Diagnosis not present

## 2020-03-24 DIAGNOSIS — Z794 Long term (current) use of insulin: Secondary | ICD-10-CM | POA: Diagnosis not present

## 2020-03-24 DIAGNOSIS — Z0000 Encounter for general adult medical examination without abnormal findings: Secondary | ICD-10-CM | POA: Diagnosis not present

## 2020-03-24 DIAGNOSIS — J9610 Chronic respiratory failure, unspecified whether with hypoxia or hypercapnia: Secondary | ICD-10-CM | POA: Diagnosis not present

## 2020-03-24 DIAGNOSIS — I5020 Unspecified systolic (congestive) heart failure: Secondary | ICD-10-CM | POA: Diagnosis not present

## 2020-03-24 DIAGNOSIS — I2510 Atherosclerotic heart disease of native coronary artery without angina pectoris: Secondary | ICD-10-CM | POA: Diagnosis not present

## 2020-03-24 DIAGNOSIS — E1165 Type 2 diabetes mellitus with hyperglycemia: Secondary | ICD-10-CM | POA: Diagnosis not present

## 2020-03-24 DIAGNOSIS — E1140 Type 2 diabetes mellitus with diabetic neuropathy, unspecified: Secondary | ICD-10-CM | POA: Diagnosis not present

## 2020-03-24 DIAGNOSIS — J84112 Idiopathic pulmonary fibrosis: Secondary | ICD-10-CM | POA: Diagnosis not present

## 2020-03-24 DIAGNOSIS — E1151 Type 2 diabetes mellitus with diabetic peripheral angiopathy without gangrene: Secondary | ICD-10-CM | POA: Diagnosis not present

## 2020-03-24 DIAGNOSIS — Z79899 Other long term (current) drug therapy: Secondary | ICD-10-CM | POA: Diagnosis not present

## 2020-03-24 DIAGNOSIS — D649 Anemia, unspecified: Secondary | ICD-10-CM | POA: Diagnosis not present

## 2020-03-24 NOTE — Telephone Encounter (Signed)
Post procedure phone call.  Started speaking to patiient then lost the call. Attempted to call back, but no answer.

## 2020-03-28 DIAGNOSIS — J9601 Acute respiratory failure with hypoxia: Secondary | ICD-10-CM | POA: Diagnosis not present

## 2020-03-28 DIAGNOSIS — J449 Chronic obstructive pulmonary disease, unspecified: Secondary | ICD-10-CM | POA: Diagnosis not present

## 2020-04-20 ENCOUNTER — Ambulatory Visit
Payer: Medicare HMO | Attending: Student in an Organized Health Care Education/Training Program | Admitting: Student in an Organized Health Care Education/Training Program

## 2020-04-20 ENCOUNTER — Encounter: Payer: Self-pay | Admitting: Student in an Organized Health Care Education/Training Program

## 2020-04-20 ENCOUNTER — Other Ambulatory Visit: Payer: Self-pay

## 2020-04-20 ENCOUNTER — Ambulatory Visit: Admission: RE | Admit: 2020-04-20 | Payer: Medicare HMO | Source: Ambulatory Visit

## 2020-04-20 DIAGNOSIS — M19011 Primary osteoarthritis, right shoulder: Secondary | ICD-10-CM

## 2020-04-20 DIAGNOSIS — G894 Chronic pain syndrome: Secondary | ICD-10-CM

## 2020-04-20 DIAGNOSIS — G8929 Other chronic pain: Secondary | ICD-10-CM | POA: Diagnosis not present

## 2020-04-20 DIAGNOSIS — M25511 Pain in right shoulder: Secondary | ICD-10-CM | POA: Diagnosis not present

## 2020-04-20 MED ORDER — LIDOCAINE HCL 2 % IJ SOLN
20.0000 mL | Freq: Once | INTRAMUSCULAR | Status: AC
  Administered 2020-04-20: 400 mg

## 2020-04-20 MED ORDER — HYDROCODONE-ACETAMINOPHEN 10-325 MG PO TABS: 1.0000 | ORAL_TABLET | Freq: Two times a day (BID) | ORAL | 0 refills | Status: DC | PRN

## 2020-04-20 MED ORDER — DIAZEPAM 5 MG PO TABS
5.0000 mg | ORAL_TABLET | Freq: Once | ORAL | Status: AC
  Administered 2020-04-20: 5 mg via ORAL
  Filled 2020-04-20: qty 1

## 2020-04-20 MED ORDER — LIDOCAINE HCL 2 % IJ SOLN
INTRAMUSCULAR | Status: AC
  Filled 2020-04-20: qty 20

## 2020-04-20 NOTE — Progress Notes (Signed)
Nursing Pain Medication Assessment:  Safety precautions to be maintained throughout the outpatient stay will include: orient to surroundings, keep bed in low position, maintain call bell within reach at all times, provide assistance with transfer out of bed and ambulation.  Medication Inspection Compliance: [name] did not comply with our request to bring his pills to be counted. He was reminded that bringing the medication bottles, even when empty, is a requirement.  Medication: None brought in. Pill/Patch Count: None available to be counted. Bottle Appearance: No container available. Did not bring bottle(s) to appointment. Filled Date: N/A Last Medication intake:  Today   Pt had a urine drug test on 11/25/19

## 2020-04-20 NOTE — Progress Notes (Signed)
PROVIDER NOTE: Information contained herein reflects review and annotations entered in association with encounter. Interpretation of such information and data should be left to medically-trained personnel. Information provided to patient can be located elsewhere in the medical record under "Patient Instructions". Document created using STT-dictation technology, any transcriptional errors that may result from process are unintentional.    Patient: [name]  Service Category: Procedure  Provider: Gillis Santa, MD  DOB: 06/02/1961  DOS: 04/20/2020  Location: [facility]  MRN: [id number]  Setting: Ambulatory - outpatient  Referring Provider: Gillis Santa, MD  Type: Established Patient  Specialty: Interventional Pain Management  PCP: Tracie Harrier, MD   Primary Reason for Visit: Interventional Pain Management Treatment. CC: Shoulder Pain (right)  Procedure:          Anesthesia, Analgesia, Anxiolysis:  Sprint right axillary nerve peripheral nerve stimulation, for right shoulder pain related to osteoarthritis.  Type: Local Anesthesia with p.o. Valium, 5 mg  Local Anesthetic: Lidocaine 1-2%  Position: Sitting   Indications: 1. Chronic right shoulder pain   2. Primary osteoarthritis of right shoulder   3. Chronic pain syndrome    Pain Score: Pre-procedure: 5 /10 Post-procedure: 2 /10   Pre-op Assessment:  [name] is a 59 y.o. (year old), male patient, seen today for interventional treatment. He  has a past surgical history that includes Cardiac catheterization (N/A, 06/09/2015); Cardiac catheterization (06/09/2015); Coronary artery bypass graft (2015); Cardiac catheterization; Application if wound vac; Wound debridement; stent in leg (Left); Endarterectomy (Left, 09/08/2017); and Lower Extremity Angiography (Right, 01/29/2019). [name] has a current medication list which includes the following prescription(s): acetaminophen, albuterol, aspirin ec, carvedilol, furosemide,  [START ON 04/29/2020] hydrocodone-acetaminophen, [START ON 05/29/2020] hydrocodone-acetaminophen, [START ON 06/28/2020] hydrocodone-acetaminophen, metformin, esbriet, pravastatin, sodium chloride, tamsulosin, and metolazone. His primarily concern today is the Shoulder Pain (right)  Initial Vital Signs:  Pulse/HCG Rate: 86ECG Heart Rate: 82 Temp: (!) 97.2 F (36.2 C) Resp: 16 BP: 112/74 SpO2: 100 %  BMI: Estimated body mass index is 29.13 kg/m as calculated from the following:   Height as of this encounter: 5\' 7"  (1.702 m).   Weight as of this encounter: 186 lb (84.4 kg).  Risk Assessment: Allergies: Reviewed. He has No Known Allergies.  Allergy Precautions: None required Coagulopathies: Reviewed. None identified.  Blood-thinner therapy: None at this time Active Infection(s): Reviewed. None identified. [name] is afebrile  Site Confirmation: [name] was asked to confirm the procedure and laterality before marking the site Procedure checklist: Completed Consent: Before the procedure and under the influence of no sedative(s), amnesic(s), or anxiolytics, the patient was informed of the treatment options, risks and possible complications. To fulfill our ethical and legal obligations, as recommended by the American Medical Association's Code of Ethics, I have informed the patient of my clinical impression; the nature and purpose of the treatment or procedure; the risks, benefits, and possible complications of the intervention; the alternatives, including doing nothing; the risk(s) and benefit(s) of the alternative treatment(s) or procedure(s); and the risk(s) and benefit(s) of doing nothing. The patient was provided information about the general risks and possible complications associated with the procedure. These may include, but are not limited to: failure to achieve desired goals, infection, bleeding, organ or nerve damage, allergic reactions, paralysis, and death. In addition, the patient  was informed of those risks and complications associated to the procedure, such as failure to decrease pain; infection; bleeding; organ or nerve damage with subsequent damage to sensory, motor, and/or autonomic systems,  resulting in permanent pain, numbness, and/or weakness of one or several areas of the body; allergic reactions; (i.e.: anaphylactic reaction); and/or death. Furthermore, the patient was informed of those risks and complications associated with the medications. These include, but are not limited to: allergic reactions (i.e.: anaphylactic or anaphylactoid reaction(s)); adrenal axis suppression; blood sugar elevation that in diabetics may result in ketoacidosis or comma; water retention that in patients with history of congestive heart failure may result in shortness of breath, pulmonary edema, and decompensation with resultant heart failure; weight gain; swelling or edema; medication-induced neural toxicity; particulate matter embolism and blood vessel occlusion with resultant organ, and/or nervous system infarction; and/or aseptic necrosis of one or more joints. Finally, the patient was informed that Medicine is not an exact science; therefore, there is also the possibility of unforeseen or unpredictable risks and/or possible complications that may result in a catastrophic outcome. The patient indicated having understood very clearly. We have given the patient no guarantees and we have made no promises. Enough time was given to the patient to ask questions, all of which were answered to the patient's satisfaction. [name] has indicated that he wanted to continue with the procedure. Attestation: I, the ordering provider, attest that I have discussed with the patient the benefits, risks, side-effects, alternatives, likelihood of achieving goals, and potential problems during recovery for the procedure that I have provided informed consent. Date  Time: 04/20/2020  8:09 AM  Pre-Procedure  Preparation:  Monitoring: As per clinic protocol. Respiration, ETCO2, SpO2, BP, heart rate and rhythm monitor placed and checked for adequate function Safety Precautions: Patient was assessed for positional comfort and pressure points before starting the procedure. Time-out: I initiated and conducted the "Time-out" before starting the procedure, as per protocol. The patient was asked to participate by confirming the accuracy of the "Time Out" information. Verification of the correct person, site, and procedure were performed and confirmed by me, the nursing staff, and the patient. "Time-out" conducted as per Joint Commission's Universal Protocol (UP.01.01.01). Time: 0834  Description of Procedure:          Area Prepped: Entire shoulder Area: Sprint peripheral nerve stimulation of right axillary nerve under ultrasound guidance. DuraPrep (Iodine Povacrylex [0.7% available iodine] and Isopropyl Alcohol, 74% w/w)  After the risks, benefits and alternatives were discussed  with the patient and informed consent was obtained, patient  was placed in the sitting position with arm relaxed at side.  Prior to delivery of anesthetics, the painful region was carefully  outlined with a marker. Appropriate skin and bony landmarks  were identified, and pertinent vascular structures were located.   The skin overlying the shoulder area was prepped and  draped in sterile fashion.  Ultrasound and landmark technique was used to identify the deltoid muscle.  An introducer needle and stimulating probe were  assembled, inserted and advanced 6 cm below the acromion within the middle third of the deltoid muscle bounded on the bottom by the insertion of the deltoid muscle into the fold of the axilla.  Insertion was perpendicular to the skin.  Multiple stimulation parameters were used to deliver stimulation to the axillary nerve which was confirmed via ultrasound by observing the deltoid muscle holding tension.  The  stimulating probe was removed from the introducer and a percutaneous lead was guided through the needle and delivered to a location in similar proximity to the nerve.   Final location was verified with electrical stimulation and documented with ultrasound.   The introducer needle was  removed, and the exposed end of the percutaneous lead was attached to an external stimulator unit.   Various electrical parameter combinations were again tested until the patient indicated paresthesia or muscle tension  overlapping the distribution of the patient's typical region of pain.   After confirming that lead impedance was in the normal range, the external unit was detached, the needle was removed, and the lead was anchored at the skin.  The lead was threaded into the connector block and electrical continuity and desired patient response was confirmed. The connector block was attached to the external stimulator unit.The patient was observed for stability of vital signs and comfort.   .         Vitals:   04/20/20 0840 04/20/20 0845 04/20/20 0850 04/20/20 0940  BP: 132/86 131/82 118/71 (!) 89/73  Pulse: 90 89 90   Resp: 17 18 16 17   Temp:      TempSrc:      SpO2: 100% 100% 100% 100%  Weight:      Height:        Start Time: 0834 hrs. End Time: 0849 hrs.  Imaging Guidance (Non-Spinal):          Type of Imaging Technique: Ultrasound guidance to monitor deltoid contraction Indication(s): Assistance in needle guidance and placement for procedures requiring needle placement in or near specific anatomical locations not easily accessible without such assistance. Interpretation: I personally interpreted the images and confirm contraction of the deltoid muscle after placement of axillary nerve catheter  Antibiotic Prophylaxis:   Anti-infectives (From admission, onward)   None     Indication(s): None identified  Post-operative Assessment:  Post-procedure Vital Signs:  Pulse/HCG Rate: 9082 Temp:  (!) 97.2 F (36.2 C) Resp: 17 BP: (!) 89/73 SpO2: 100 %  EBL: None  Complications: No immediate post-treatment complications observed by team, or reported by patient.  Note: The patient tolerated the entire procedure well. A repeat set of vitals were taken after the procedure and the patient was kept under observation following institutional policy, for this type of procedure. Post-procedural neurological assessment was performed, showing return to baseline, prior to discharge. The patient was provided with post-procedure discharge instructions, including a section on how to identify potential problems. Should any problems arise concerning this procedure, the patient was given instructions to immediately contact us, at any time, without hesitation. In any case, we plan to contact the patient by telephone for a follow-up status report regarding this interventional procedure.  Comments:  No additional relevant information.   Pharmacotherapy Assessment   Also refill patient's hydrocodone as below.  No change in dose.  UDS up-to-date and appropriate.  Analgesic: Norco 10 mg 3 times daily as needed, quantity 75/month MME equals 25   Monitoring: [name]: PDMP reviewed during this encounter.       Pharmacotherapy: No side-effects or adverse reactions reported. Compliance: No problems identified. Effectiveness: Clinically acceptable.  UDS:  Summary  Date Value Ref Range Status  11/21/2019 Note  Final    Comment:    ==================================================================== Compliance Drug Analysis, Ur ==================================================================== Test                             Result       Flag       Units Drug Present and Declared for Prescription Verification   Hydrocodone                    993  EXPECTED   ng/mg creat   Hydromorphone                  507          EXPECTED   ng/mg creat   Norhydrocodone                 2376         EXPECTED    ng/mg creat    Sources of hydrocodone include scheduled prescription medications.    Hydromorphone and norhydrocodone are expected metabolites of    hydrocodone. Hydromorphone is also available as a scheduled    prescription medication.   Acetaminophen                  PRESENT      EXPECTED Drug Present not Declared for Prescription Verification   Naproxen                       PRESENT      UNEXPECTED Drug Absent but Declared for Prescription Verification   Diclofenac                     Not Detected UNEXPECTED    Topical diclofenac, as indicated in the declared medication list, is    not always detected even when used as directed.   Salicylate                     Not Detected UNEXPECTED    Aspirin, as indicated in the declared medication list, is not always    detected even when used as directed. ==================================================================== Test                      Result    Flag   Units      Ref Range   Creatinine              42               mg/dL      >=20 ==================================================================== Declared Medications:  The flagging and interpretation on this report are based on the  following declared medications.  Unexpected results may arise from  inaccuracies in the declared medications.  **Note: The testing scope of this panel includes these medications:  Hydrocodone  **Note: The testing scope of this panel does not include small to  moderate amounts of these reported medications:  Acetaminophen  Aspirin  Topical Diclofenac  **Note: The testing scope of this panel does not include the  following reported medications:  Albuterol  Carvedilol (Coreg)  Furosemide  Glipizide  Liraglutide (Victoza)  Lisinopril  Metformin  Metolazone  Pirfenidone (Esbriet)  Pravastatin  Sodium Chloride  Spironolactone  Tamsulosin  Tiotropium (Spiriva) ==================================================================== For clinical  consultation, please call [phone]. ====================================================================        Plan of Care   Medications ordered for procedure: Meds ordered this encounter  Medications  . lidocaine (XYLOCAINE) 2 % (with pres) injection 400 mg  . diazepam (VALIUM) tablet 5 mg  . HYDROcodone-acetaminophen (NORCO) 10-325 MG tablet    Sig: Take 1-2 tablets by mouth 2 (two) times daily as needed for severe pain. Must last 30 days.    Dispense:  75 tablet    Refill:  0    Chronic Pain. (STOP Act - Not applicable). Fill one day early if closed on scheduled refill date.  Marland Kitchen HYDROcodone-acetaminophen (NORCO) 10-325 MG tablet  Sig: Take 1-2 tablets by mouth 2 (two) times daily as needed for severe pain. Must last 30 days.    Dispense:  75 tablet    Refill:  0    Chronic Pain. (STOP Act - Not applicable). Fill one day early if closed on scheduled refill date.  Marland Kitchen HYDROcodone-acetaminophen (NORCO) 10-325 MG tablet    Sig: Take 1-2 tablets by mouth 2 (two) times daily as needed for severe pain. Must last 30 days.    Dispense:  75 tablet    Refill:  0    Chronic Pain. (STOP Act - Not applicable). Fill one day early if closed on scheduled refill date.    Follow-up plan:   Return in about 8 weeks (around 06/15/2020) for PNS Lead pull.      s/p R GH shoulder joint injection 04/03/19, 08/26/19 (anterior approach), 03/23/2020- repeat prn; right axillary nerve PNS      Recent Visits Date Type Provider Dept  03/23/20 Procedure visit Gillis Santa, MD [facility]  01/23/20 Office Visit Gillis Santa, MD [facility]  Showing recent visits within past 90 days and meeting all other requirements Today's Visits Date Type Provider Dept  04/20/20 Procedure visit Gillis Santa, MD [facility]  Showing today's visits and meeting all other requirements Future Appointments Date Type Provider Dept  06/22/20 Appointment Gillis Santa, MD [facility]  07/14/20 Appointment Gillis Santa, MD [facility]  Showing future appointments within next 90 days and meeting all other requirements  Disposition: Discharge home  Discharge (Date  Time): 04/20/2020; 0943 hrs.   Primary Care Physician: Tracie Harrier, MD Location: [facility] Note by: Gillis Santa, MD Date: 04/20/2020; Time: 10:37 AM  Disclaimer:  Medicine is not an exact science. The only guarantee in medicine is that nothing is guaranteed. It is important to note that the decision to proceed with this intervention was based on the information collected from the patient. The Data and conclusions were drawn from the patient's questionnaire, the interview, and the physical examination. Because the information was provided in large part by the patient, it cannot be guaranteed that it has not been purposely or unconsciously manipulated. Every effort has been made to obtain as much relevant data as possible for this evaluation. It is important to note that the conclusions that lead to this procedure are derived in large part from the available data. Always take into account that the treatment will also be dependent on availability of resources and existing treatment guidelines, considered by other Pain Management Practitioners as being common knowledge and practice, at the time of the intervention. For Medico-Legal purposes, it is also important to point out that variation in procedural techniques and pharmacological choices are the acceptable norm. The indications, contraindications, technique, and results of the above procedure should only be interpreted and judged by a Board-Certified Interventional Pain Specialist with extensive familiarity and expertise in the same exact procedure and technique.

## 2020-04-20 NOTE — Progress Notes (Signed)
Safety precautions to be maintained throughout the outpatient stay will include: orient to surroundings, keep bed in low position, maintain call bell within reach at all times, provide assistance with transfer out of bed and ambulation.  

## 2020-04-21 ENCOUNTER — Telehealth: Payer: Self-pay | Admitting: *Deleted

## 2020-04-21 DIAGNOSIS — L298 Other pruritus: Secondary | ICD-10-CM | POA: Diagnosis not present

## 2020-04-21 DIAGNOSIS — I5020 Unspecified systolic (congestive) heart failure: Secondary | ICD-10-CM | POA: Diagnosis not present

## 2020-04-21 DIAGNOSIS — E1151 Type 2 diabetes mellitus with diabetic peripheral angiopathy without gangrene: Secondary | ICD-10-CM | POA: Diagnosis not present

## 2020-04-21 DIAGNOSIS — J84112 Idiopathic pulmonary fibrosis: Secondary | ICD-10-CM | POA: Diagnosis not present

## 2020-04-21 DIAGNOSIS — J9610 Chronic respiratory failure, unspecified whether with hypoxia or hypercapnia: Secondary | ICD-10-CM | POA: Diagnosis not present

## 2020-04-21 DIAGNOSIS — J449 Chronic obstructive pulmonary disease, unspecified: Secondary | ICD-10-CM | POA: Diagnosis not present

## 2020-04-21 DIAGNOSIS — Z23 Encounter for immunization: Secondary | ICD-10-CM | POA: Diagnosis not present

## 2020-04-21 NOTE — Telephone Encounter (Signed)
Spoke with patient's wife, she reports no problems post procedure. °

## 2020-04-23 ENCOUNTER — Encounter: Payer: Medicare HMO | Admitting: Student in an Organized Health Care Education/Training Program

## 2020-04-27 DIAGNOSIS — J9601 Acute respiratory failure with hypoxia: Secondary | ICD-10-CM | POA: Diagnosis not present

## 2020-04-27 DIAGNOSIS — J449 Chronic obstructive pulmonary disease, unspecified: Secondary | ICD-10-CM | POA: Diagnosis not present

## 2020-04-29 DIAGNOSIS — I5020 Unspecified systolic (congestive) heart failure: Secondary | ICD-10-CM | POA: Diagnosis not present

## 2020-04-29 DIAGNOSIS — I6522 Occlusion and stenosis of left carotid artery: Secondary | ICD-10-CM | POA: Diagnosis not present

## 2020-04-29 DIAGNOSIS — J439 Emphysema, unspecified: Secondary | ICD-10-CM | POA: Diagnosis not present

## 2020-04-29 DIAGNOSIS — E785 Hyperlipidemia, unspecified: Secondary | ICD-10-CM | POA: Diagnosis not present

## 2020-04-29 DIAGNOSIS — I214 Non-ST elevation (NSTEMI) myocardial infarction: Secondary | ICD-10-CM | POA: Diagnosis not present

## 2020-04-29 DIAGNOSIS — I739 Peripheral vascular disease, unspecified: Secondary | ICD-10-CM | POA: Diagnosis not present

## 2020-04-29 DIAGNOSIS — I9789 Other postprocedural complications and disorders of the circulatory system, not elsewhere classified: Secondary | ICD-10-CM | POA: Diagnosis not present

## 2020-04-29 DIAGNOSIS — I2510 Atherosclerotic heart disease of native coronary artery without angina pectoris: Secondary | ICD-10-CM | POA: Diagnosis not present

## 2020-04-29 DIAGNOSIS — Z951 Presence of aortocoronary bypass graft: Secondary | ICD-10-CM | POA: Diagnosis not present

## 2020-05-28 DIAGNOSIS — J449 Chronic obstructive pulmonary disease, unspecified: Secondary | ICD-10-CM | POA: Diagnosis not present

## 2020-05-28 DIAGNOSIS — J9601 Acute respiratory failure with hypoxia: Secondary | ICD-10-CM | POA: Diagnosis not present

## 2020-05-28 NOTE — Progress Notes (Signed)
Cancelled visit

## 2020-06-01 ENCOUNTER — Ambulatory Visit (INDEPENDENT_AMBULATORY_CARE_PROVIDER_SITE_OTHER): Payer: Medicare HMO | Admitting: Nurse Practitioner

## 2020-06-01 ENCOUNTER — Other Ambulatory Visit: Payer: Self-pay

## 2020-06-01 ENCOUNTER — Ambulatory Visit (INDEPENDENT_AMBULATORY_CARE_PROVIDER_SITE_OTHER): Payer: Medicare HMO

## 2020-06-01 ENCOUNTER — Encounter (INDEPENDENT_AMBULATORY_CARE_PROVIDER_SITE_OTHER): Payer: Self-pay | Admitting: Nurse Practitioner

## 2020-06-01 VITALS — BP 125/78 | HR 86 | Resp 16 | Wt 195.0 lb

## 2020-06-01 DIAGNOSIS — E1142 Type 2 diabetes mellitus with diabetic polyneuropathy: Secondary | ICD-10-CM

## 2020-06-01 DIAGNOSIS — I739 Peripheral vascular disease, unspecified: Secondary | ICD-10-CM | POA: Diagnosis not present

## 2020-06-01 DIAGNOSIS — E785 Hyperlipidemia, unspecified: Secondary | ICD-10-CM | POA: Diagnosis not present

## 2020-06-07 ENCOUNTER — Encounter (INDEPENDENT_AMBULATORY_CARE_PROVIDER_SITE_OTHER): Payer: Self-pay | Admitting: Nurse Practitioner

## 2020-06-07 NOTE — Progress Notes (Signed)
Subjective:    Patient ID: [name], male    DOB: [birth year], 59 y.o.   MRN: [id number] Chief Complaint  Patient presents with  . Follow-up    66month ultrasound followup    The patient returns to the office for followup and review of the noninvasive studies. There have been no interval changes in lower extremity symptoms. No interval shortening of the patient's claudication distance or development of rest pain symptoms. No new ulcers or wounds have occurred since the last visit.  There have been no significant changes to the patient's overall health care.  The patient denies amaurosis fugax or recent TIA symptoms. There are no recent neurological changes noted. The patient denies history of DVT, PE or superficial thrombophlebitis. The patient denies recent episodes of angina or shortness of breath.   ABI Rt=1.59 and Lt=1.01  (previous ABI's Rt=1.28 and Lt=0.91) Duplex ultrasound of the right tibial arteries reveals triphasic waveforms with monophasic waveforms in the left lower extremity.  Duplex of the right lower extremity reveals a 50 to 74% stenosis noted within the deep femoral artery as well as in the superficial femoral artery.  The mid SFA velocities may be due to a change in diameter from 0.53cm to 0.23 cm seen within the right stent.   Review of Systems  Neurological: Positive for numbness.  All other systems reviewed and are negative.      Objective:   Physical Exam Vitals reviewed.  HENT:     Head: Normocephalic.  Cardiovascular:     Rate and Rhythm: Normal rate.  Pulmonary:     Effort: Pulmonary effort is normal.  Skin:    General: Skin is warm and dry.  Neurological:     Mental Status: He is alert and oriented to person, place, and time.  Psychiatric:        Mood and Affect: Mood normal.        Behavior: Behavior normal.        Thought Content: Thought content normal.        Judgment: Judgment normal.     BP 125/78 (BP Location: Left Arm)    Pulse 86   Resp 16   Wt 195 lb (88.5 kg)   BMI 30.54 kg/m   Past Medical History:  Diagnosis Date  . Cancer ([place])    skin (scalp)  . CHF (congestive heart failure) ([place])   . COPD (chronic obstructive pulmonary disease) ([place])   . Coronary artery disease   . Diabetes mellitus without complication ([place])   . Dyspnea   . Fibromyalgia   . History of kidney stones    asymptomatic  . MI (myocardial infarction) ([place])   . Peripheral vascular disease ([place])   . Pneumonia     Social History   Socioeconomic History  . Marital status: Married    Spouse name: jerri  . Number of children: 2  . Years of education: Not on file  . Highest education level: Some college, no degree  Occupational History  . Not on file  Tobacco Use  . Smoking status: Former Smoker    Types: Cigarettes    Quit date: 10/20/2013    Years since quitting: 6.6  . Smokeless tobacco: Never Used  Vaping Use  . Vaping Use: Never used  Substance and Sexual Activity  . Alcohol use: Not Currently    Comment: seldom  . Drug use: No  . Sexual activity: Not on file  Other Topics Concern  . Not on file  Social History Narrative  . Not on file   Social Determinants of Health   Financial Resource Strain:   . Difficulty of Paying Living Expenses: Not on file  Food Insecurity:   . Worried About Charity fundraiser in the Last Year: Not on file  . Ran Out of Food in the Last Year: Not on file  Transportation Needs:   . Lack of Transportation (Medical): Not on file  . Lack of Transportation (Non-Medical): Not on file  Physical Activity:   . Days of Exercise per Week: Not on file  . Minutes of Exercise per Session: Not on file  Stress:   . Feeling of Stress : Not on file  Social Connections:   . Frequency of Communication with Friends and Family: Not on file  . Frequency of Social Gatherings with Friends and Family: Not on file  . Attends Religious Services: Not on file  . Active Member of Clubs or  Organizations: Not on file  . Attends Archivist Meetings: Not on file  . Marital Status: Not on file  Intimate Partner Violence:   . Fear of Current or Ex-Partner: Not on file  . Emotionally Abused: Not on file  . Physically Abused: Not on file  . Sexually Abused: Not on file    Past Surgical History:  Procedure Laterality Date  . APPLICATION OF WOUND VAC     chest wound s/p CABG  . CARDIAC CATHETERIZATION    . CORONARY ARTERY BYPASS GRAFT  2015   3 vessels  . ENDARTERECTOMY Left 09/08/2017   Procedure: ENDARTERECTOMY CAROTID;  Surgeon: Katha Cabal, MD;  Location: [facility];  Service: Vascular;  Laterality: Left;  . LOWER EXTREMITY ANGIOGRAPHY Right 01/29/2019   Procedure: LOWER EXTREMITY ANGIOGRAPHY;  Surgeon: Katha Cabal, MD;  Location: [place] [facility];  Service: Cardiovascular;  Laterality: Right;  . PERIPHERAL VASCULAR CATHETERIZATION N/A 06/09/2015   Procedure: Abdominal Aortogram w/Lower Extremity;  Surgeon: Katha Cabal, MD;  Location: [place];  Service: Cardiovascular;  Laterality: N/A;  . PERIPHERAL VASCULAR CATHETERIZATION  06/09/2015   Procedure: Lower Extremity Intervention;  Surgeon: Katha Cabal, MD;  Location: [place] [facility];  Service: Cardiovascular;;  . stent in leg Left   . WOUND DEBRIDEMENT     debridement of chest wound x 2 s/p CABG     Family History  Problem Relation Age of Onset  . Heart attack Mother   . Cancer Father   . Mental illness Neg Hx     No Known Allergies  CBC Latest Ref Rng & Units 07/21/2018 07/20/2018 09/09/2017  WBC 4.0 - 10.5 K/uL 7.6 8.0 10.3  Hemoglobin 13.0 - 17.0 g/dL 11.9(L) 12.2(L) 11.1(L)  Hematocrit 39 - 52 % 39.0 39.4 34.8(L)  Platelets 150 - 400 K/uL 160 149(L) 150      CMP     Component Value Date/Time   NA 137 07/23/2018 0823   NA 134 (L) 10/22/2013 0507   K 4.2 07/23/2018 0823   K 4.5 10/22/2013 0507   CL 102 07/23/2018 0823   CL 101 10/22/2013 0507   CO2  27 07/23/2018 0823   CO2 28 10/22/2013 0507   GLUCOSE 174 (H) 07/23/2018 0823   GLUCOSE 300 (H) 10/22/2013 0507   BUN 21 (H) 01/29/2019 0741   BUN 32 (H) 10/22/2013 0507   CREATININE 1.01 01/29/2019 0741   CREATININE 1.18 10/22/2013 0507   CALCIUM 8.5 (L) 07/23/2018 0823   CALCIUM 8.0 (  L) 10/22/2013 0507   PROT 7.2 08/17/2017 0926   PROT 8.0 10/20/2013 0319   ALBUMIN 3.7 08/17/2017 0926   ALBUMIN 3.5 10/20/2013 0319   AST 21 08/17/2017 0926   AST 19 10/20/2013 0319   ALT 20 08/17/2017 0926   ALT 27 10/20/2013 0319   ALKPHOS 44 08/17/2017 0926   ALKPHOS 52 10/20/2013 0319   BILITOT 0.6 08/17/2017 0926   BILITOT 0.5 10/20/2013 0319   GFRNONAA >60 01/29/2019 0741   GFRNONAA >60 10/22/2013 0507   GFRAA >60 01/29/2019 0741   GFRAA >60 10/22/2013 0507     VAS [place] ABI WITH/WO TBI  Result Date: 06/04/2020 LOWER EXTREMITY DOPPLER STUDY Indications: Peripheral artery disease. Other Factors: 01/29/2019 right atherectomy. sfa-pop stent and pta of eia.  Vascular Interventions: 05/14/13: Right CIA & left EIA stents with right CFA                         PTAs;                         06/17/15: Right CFA PTA;                         01/29/2019 PTA and stent right SFA and popliteal artery.                         PTA right EIA. Comparison Study: 12/02/2019 Performing Technologist: Charlane Ferretti RT (R)(VS)  Examination Guidelines: A complete evaluation includes at minimum, Doppler waveform signals and systolic blood pressure reading at the level of bilateral brachial, anterior tibial, and posterior tibial arteries, when vessel segments are accessible. Bilateral testing is considered an integral part of a complete examination. Photoelectric Plethysmograph (PPG) waveforms and toe systolic pressure readings are included as required and additional duplex testing as needed. Limited examinations for reoccurring indications may be performed as noted.  ABI Findings:  +---------+------------------+-----+---------+----------------------+ Right    Rt Pressure (mmHg)IndexWaveform Comment                +---------+------------------+-----+---------+----------------------+ Brachial                                 stimulaor patch on arm +---------+------------------+-----+---------+----------------------+ ATA      137               1.29 triphasic                       +---------+------------------+-----+---------+----------------------+ PTA      172               1.62 triphasic                       +---------+------------------+-----+---------+----------------------+ Great Toe125               1.18 Normal                          +---------+------------------+-----+---------+----------------------+ +---------+------------------+-----+----------+-------+ Left     Lt Pressure (mmHg)IndexWaveform  Comment +---------+------------------+-----+----------+-------+ Brachial 106                                      +---------+------------------+-----+----------+-------+ ATA  94                0.89 monophasic        +---------+------------------+-----+----------+-------+ PTA      109               1.03 monophasic        +---------+------------------+-----+----------+-------+ Great Toe109               1.03                   +---------+------------------+-----+----------+-------+ +-------+-----------+-----------+------------+------------+ ABI/TBIToday's ABIToday's TBIPrevious ABIPrevious TBI +-------+-----------+-----------+------------+------------+ Right  [place]         1.16       1.28        .92          +-------+-----------+-----------+------------+------------+ Left   1.01       1.01       .91         .71          +-------+-----------+-----------+------------+------------+ Bilateral ABIs appear essentially unchanged compared to prior study on 12/02/2019. Bilateral TBIs appear essentially unchanged compared to prior  study on 12/02/2019.  Summary: Right: Resting right ankle-brachial index indicates noncompressible right lower extremity arteries. The right toe-brachial index is normal. Left: Resting left ankle-brachial index is within normal range. No evidence of significant left lower extremity arterial disease. The left toe-brachial index is normal. *See table(s) above for measurements and observations.  Electronically signed by Hortencia Pilar MD on 06/04/2020 at 3:43:06 PM.    Final        Assessment & Plan:   1. PAD (peripheral artery disease) (HCC)  Recommend:  The patient has evidence of atherosclerosis of the lower extremities with claudication.  The patient does not voice lifestyle limiting changes at this point in time.  Noninvasive studies do not suggest clinically significant change.  No invasive studies, angiography or surgery at this time The patient should continue walking and begin a more formal exercise program.  The patient should continue antiplatelet therapy and aggressive treatment of the lipid abnormalities  No changes in the patient's medications at this time  The patient should continue wearing graduated compression socks 10-15 mmHg strength to control the mild edema.    2. Hyperlipidemia, unspecified hyperlipidemia type Continue statin as ordered and reviewed, no changes at this time   3. Type 2 diabetes mellitus with peripheral neuropathy (HCC) Continue hypoglycemic medications as already ordered, these medications have been reviewed and there are no changes at this time.  Hgb A1C to be monitored as already arranged by primary service    Current Outpatient Medications on File Prior to Visit  Medication Sig Dispense Refill  . acetaminophen (TYLENOL) 500 MG tablet Take 1,000-1,500 mg by mouth every 4 (four) hours as needed for moderate pain or headache.     . albuterol (PROVENTIL HFA;VENTOLIN HFA) 108 (90 Base) MCG/ACT inhaler Inhale 2 puffs into the lungs every 6 (six)  hours as needed for wheezing or shortness of breath.    Marland Kitchen aspirin EC 81 MG tablet Take 81 mg by mouth daily.    . carvedilol (COREG) 3.125 MG tablet Take by mouth.    . furosemide (LASIX) 40 MG tablet Take 40-80 mg by mouth See admin instructions. Take 80 mg in the morning and 40 mg in the evening    . HYDROcodone-acetaminophen (NORCO) 10-325 MG tablet Take 1-2 tablets by mouth 2 (two) times daily as needed for severe pain. Must last 30 days. 75 tablet  0  . [START ON 06/28/2020] HYDROcodone-acetaminophen (NORCO) 10-325 MG tablet Take 1-2 tablets by mouth 2 (two) times daily as needed for severe pain. Must last 30 days. 75 tablet 0  . metFORMIN (GLUCOPHAGE) 1000 MG tablet Take 1,000 mg by mouth 2 (two) times daily with a meal.    . Pirfenidone (ESBRIET) 267 MG CAPS Take 801 mg by mouth 2 (two) times daily.     . pravastatin (PRAVACHOL) 40 MG tablet Take 40 mg by mouth every other day.     . sodium chloride (AYR) 0.65 % nasal spray Place 1 spray into the nose as needed (dryness).    . tamsulosin (FLOMAX) 0.4 MG CAPS capsule Take 0.4 mg by mouth every evening.   3  . metolazone (ZAROXOLYN) 5 MG tablet Take 5 mg by mouth daily as needed.  (Patient not taking: Reported on 04/20/2020)     No current facility-administered medications on file prior to visit.    There are no Patient Instructions on file for this visit. No follow-ups on file.   Kris Hartmann, NP

## 2020-06-22 ENCOUNTER — Ambulatory Visit
Payer: Medicare HMO | Attending: Student in an Organized Health Care Education/Training Program | Admitting: Student in an Organized Health Care Education/Training Program

## 2020-06-22 ENCOUNTER — Encounter: Payer: Self-pay | Admitting: Student in an Organized Health Care Education/Training Program

## 2020-06-22 ENCOUNTER — Other Ambulatory Visit: Payer: Self-pay

## 2020-06-22 VITALS — BP 97/66 | HR 80 | Temp 97.3°F | Resp 16 | Ht 67.0 in | Wt 190.0 lb

## 2020-06-22 DIAGNOSIS — M25511 Pain in right shoulder: Secondary | ICD-10-CM | POA: Diagnosis not present

## 2020-06-22 DIAGNOSIS — I509 Heart failure, unspecified: Secondary | ICD-10-CM | POA: Diagnosis not present

## 2020-06-22 DIAGNOSIS — G894 Chronic pain syndrome: Secondary | ICD-10-CM | POA: Diagnosis not present

## 2020-06-22 DIAGNOSIS — M19011 Primary osteoarthritis, right shoulder: Secondary | ICD-10-CM | POA: Diagnosis not present

## 2020-06-22 DIAGNOSIS — I6522 Occlusion and stenosis of left carotid artery: Secondary | ICD-10-CM | POA: Diagnosis not present

## 2020-06-22 DIAGNOSIS — G8929 Other chronic pain: Secondary | ICD-10-CM | POA: Insufficient documentation

## 2020-06-22 DIAGNOSIS — M25512 Pain in left shoulder: Secondary | ICD-10-CM | POA: Diagnosis not present

## 2020-06-22 NOTE — Progress Notes (Signed)
Safety precautions to be maintained throughout the outpatient stay will include: orient to surroundings, keep bed in low position, maintain call bell within reach at all times, provide assistance with transfer out of bed and ambulation.  

## 2020-06-22 NOTE — Progress Notes (Signed)
PROVIDER NOTE: Information contained herein reflects review and annotations entered in association with encounter. Interpretation of such information and data should be left to medically-trained personnel. Information provided to patient can be located elsewhere in the medical record under "Patient Instructions". Document created using STT-dictation technology, any transcriptional errors that may result from process are unintentional.    Patient: [name]  Service Category: E/M  Provider: Gillis Santa, MD  DOB: 07-04-1961  DOS: 06/22/2020  Specialty: Interventional Pain Management  MRN: [id number]  Setting: Ambulatory outpatient  PCP: Tracie Harrier, MD  Type: Established Patient    Referring Provider: Tracie Harrier, MD  Location: Office  Delivery: Face-to-face     HPI  Mr. IRENE MITCHAM, a 59 y.o. year old male, is here today because of his Chronic right shoulder pain [M25.511, G89.29]. Mr. Zimbelman primary complain today is Shoulder Pain (right ) Last encounter: My last encounter with him was on 04/20/2020. Pertinent problems: Mr. Nyborg has PAD (peripheral artery disease) ([place]); Carotid stenosis; Carotid stenosis, symptomatic w/o infarct, left; CAD (coronary artery disease); Chronic right shoulder pain; S/P CABG (coronary artery bypass graft); DM (diabetes mellitus) ([place]); and Chronic pain syndrome on their pertinent problem list. Pain Assessment: Severity of Chronic pain is reported as a 6 /10. Location: Shoulder Right/denies. Onset: More than a month ago. Quality: Discomfort, Constant, Sharp (bone against bone pain). Timing: Constant. Modifying factor(s): PNS has helped the muscle pain but not the joint. Vitals:  height is '5\' 7"'  (1.702 m) and weight is 190 lb (86.2 kg). His temporal temperature is 97.3 F (36.3 C) (abnormal). His blood pressure is 97/66 and his pulse is 80. His respiration is 16 and oxygen saturation is 98%.   Reason for encounter: post-procedure assessment.     Patient presents today for right axillary nerve Sprint peripheral nerve stimulation lead pull.  He has had this in place for the last 60 days.  He states that it has helped with his muscular and tendon pain but not with his deep arthritic pain.  He does endorse some improvement in range of motion specifically with shoulder abduction.  Pharmacotherapy Assessment   Analgesic: Norco 10 mg 3 times daily as needed, quantity 75/month MME equals 25   Monitoring: [place] PMP: PDMP not reviewed this encounter.       Pharmacotherapy: No side-effects or adverse reactions reported. Compliance: No problems identified. Effectiveness: Clinically acceptable.  Janett Billow, RN  06/22/2020  8:36 AM  Sign when Signing Visit Safety precautions to be maintained throughout the outpatient stay will include: orient to surroundings, keep bed in low position, maintain call bell within reach at all times, provide assistance with transfer out of bed and ambulation.     UDS:  Summary  Date Value Ref Range Status  11/21/2019 Note  Final    Comment:    ==================================================================== Compliance Drug Analysis, Ur ==================================================================== Test                             Result       Flag       Units Drug Present and Declared for Prescription Verification   Hydrocodone                    993          EXPECTED   ng/mg creat   Hydromorphone  507          EXPECTED   ng/mg creat   Norhydrocodone                 2376         EXPECTED   ng/mg creat    Sources of hydrocodone include scheduled prescription medications.    Hydromorphone and norhydrocodone are expected metabolites of    hydrocodone. Hydromorphone is also available as a scheduled    prescription medication.   Acetaminophen                  PRESENT      EXPECTED Drug Present not Declared for Prescription Verification   Naproxen                        PRESENT      UNEXPECTED Drug Absent but Declared for Prescription Verification   Diclofenac                     Not Detected UNEXPECTED    Topical diclofenac, as indicated in the declared medication list, is    not always detected even when used as directed.   Salicylate                     Not Detected UNEXPECTED    Aspirin, as indicated in the declared medication list, is not always    detected even when used as directed. ==================================================================== Test                      Result    Flag   Units      Ref Range   Creatinine              42               mg/dL      >=20 ==================================================================== Declared Medications:  The flagging and interpretation on this report are based on the  following declared medications.  Unexpected results may arise from  inaccuracies in the declared medications.  **Note: The testing scope of this panel includes these medications:  Hydrocodone  **Note: The testing scope of this panel does not include small to  moderate amounts of these reported medications:  Acetaminophen  Aspirin  Topical Diclofenac  **Note: The testing scope of this panel does not include the  following reported medications:  Albuterol  Carvedilol (Coreg)  Furosemide  Glipizide  Liraglutide (Victoza)  Lisinopril  Metformin  Metolazone  Pirfenidone (Esbriet)  Pravastatin  Sodium Chloride  Spironolactone  Tamsulosin  Tiotropium (Spiriva) ==================================================================== For clinical consultation, please call [phone]. ====================================================================      ROS  Constitutional: Denies any fever or chills Gastrointestinal: No reported hemesis, hematochezia, vomiting, or acute GI distress Musculoskeletal: Right shoulder pain Neurological: No reported episodes of acute onset apraxia, aphasia, dysarthria, agnosia,  amnesia, paralysis, loss of coordination, or loss of consciousness  Medication Review  HYDROcodone-acetaminophen, Pirfenidone, acetaminophen, albuterol, aspirin EC, carvedilol, furosemide, metFORMIN, metolazone, pravastatin, sodium chloride, and tamsulosin  History Review  Allergy: Mr. Burkett has No Known Allergies. Drug: Mr. Space  reports no history of drug use. Alcohol:  reports previous alcohol use. Tobacco:  reports that he quit smoking about 6 years ago. His smoking use included cigarettes. He has never used smokeless tobacco. Social: Mr. Bunte  reports that he quit smoking about 6 years ago. His smoking use included cigarettes. He has never used  smokeless tobacco. He reports previous alcohol use. He reports that he does not use drugs. Medical:  has a past medical history of Cancer ([place]), CHF (congestive heart failure) ([place]), COPD (chronic obstructive pulmonary disease) ([place]), Coronary artery disease, Diabetes mellitus without complication ([place]), Dyspnea, Fibromyalgia, History of kidney stones, MI (myocardial infarction) ([place]), Peripheral vascular disease ([place]), and Pneumonia. Surgical: Mr. Dack  has a past surgical history that includes Cardiac catheterization (N/A, 06/09/2015); Cardiac catheterization (06/09/2015); Coronary artery bypass graft (2015); Cardiac catheterization; Application if wound vac; Wound debridement; stent in leg (Left); Endarterectomy (Left, 09/08/2017); and Lower Extremity Angiography (Right, 01/29/2019). Family: family history includes Cancer in his father; Heart attack in his mother.  Laboratory Chemistry Profile   Renal Lab Results  Component Value Date   BUN 21 (H) 01/29/2019   CREATININE 1.01 01/29/2019   GFRAA >60 01/29/2019   GFRNONAA >60 01/29/2019     Hepatic Lab Results  Component Value Date   AST 21 08/17/2017   ALT 20 08/17/2017   ALBUMIN 3.7 08/17/2017   ALKPHOS 44 08/17/2017     Electrolytes Lab Results  Component Value Date   NA 137  07/23/2018   K 4.2 07/23/2018   CL 102 07/23/2018   CALCIUM 8.5 (L) 07/23/2018   MG 2.0 07/23/2018   PHOS 4.4 09/10/2017     Bone No results found for: VD25OH, VD125OH2TOT, CV8938BO1, BP1025EN2, 25OHVITD1, 25OHVITD2, 25OHVITD3, TESTOFREE, TESTOSTERONE   Inflammation (CRP: Acute Phase) (ESR: Chronic Phase) Lab Results  Component Value Date   LATICACIDVEN 1.4 09/09/2017       Note: Above Lab results reviewed.  Recent Imaging Review  VAS [place] LOWER EXTREMITY ARTERIAL DUPLEX LOWER EXTREMITY ARTERIAL DUPLEX STUDY   Current ABI: Right = [place] = 1.01  Comparison Study: 12/02/2019  Performing Technologist: Charlane Ferretti RT (R)(VS)    Examination Guidelines: A complete evaluation includes B-mode imaging, spectral Doppler, color Doppler, and power Doppler as needed of all accessible portions of each vessel. Bilateral testing is considered an integral part of a complete examination. Limited examinations for reoccurring indications may be performed as noted.    +-----------+--------+-----+--------+----------+---------------------+ RIGHT      PSV cm/sRatioStenosisWaveform  Comments              +-----------+--------+-----+--------+----------+---------------------+ CFA Distal 240                  biphasic                        +-----------+--------+-----+--------+----------+---------------------+ DFA        240                  triphasic                       +-----------+--------+-----+--------+----------+---------------------+ SFA Prox   172                  triphasic Stent                 +-----------+--------+-----+--------+----------+---------------------+ SFA Mid    243                  triphasic Stent Diameter change +-----------+--------+-----+--------+----------+---------------------+ SFA Distal 115                  biphasic  Stent                 +-----------+--------+-----+--------+----------+---------------------+ POP Prox    160  biphasic  Stent                 +-----------+--------+-----+--------+----------+---------------------+ POP Distal 91                   triphasic                       +-----------+--------+-----+--------+----------+---------------------+ ATA Distal 69                   biphasic                        +-----------+--------+-----+--------+----------+---------------------+ PTA Distal 56                   biphasic                        +-----------+--------+-----+--------+----------+---------------------+ PERO Distal13                   monophasic                      +-----------+--------+-----+--------+----------+---------------------+    Right Stent(s): +---------------+---++---------+------------------+ Prox to Stent  152triphasic                   +---------------+---++---------+------------------+ Proximal Stent 172triphasic                   +---------------+---++---------+------------------+ Mid Stent      243triphasicChange in diameter +---------------+---++---------+------------------+ Distal Stent   115triphasic                   +---------------+---++---------+------------------+ Distal to Stent91 triphasic                   +---------------+---++---------+------------------+    Summary: Right: 50-74% stenosis noted in the deep femoral artery. 50-74% stenosis noted in the superficial femoral artery. Right mid SFA velocities are elevated possibly due to change in diameter from .53cm to .23cm as described in the previous exam on 12/02/2019.    See table(s) above for measurements and observations.  Electronically signed by Hortencia Pilar MD on 06/04/2020 at 3:43:14 PM.       Final   VAS [place] ABI WITH/WO TBI LOWER EXTREMITY DOPPLER STUDY  Indications: Peripheral artery disease.  Other Factors: 01/29/2019 right atherectomy. sfa-pop stent and pta of eia.  Vascular Interventions: 05/14/13:  Right CIA & left EIA stents with right CFA                         PTAs;                         06/17/15: Right CFA PTA;                         01/29/2019 PTA and stent right SFA and popliteal artery.                         PTA right EIA.  Comparison Study: 12/02/2019  Performing Technologist: Charlane Ferretti RT (R)(VS)    Examination Guidelines: A complete evaluation includes at minimum, Doppler waveform signals and systolic blood pressure reading at the level of bilateral brachial, anterior tibial, and posterior tibial arteries, when vessel segments are accessible. Bilateral testing is considered an integral part of a complete examination. Photoelectric Plethysmograph (PPG)  waveforms and toe systolic pressure readings are included as required and additional duplex testing as needed. Limited examinations for reoccurring indications may be performed as noted.    ABI Findings: +---------+------------------+-----+---------+----------------------+ Right    Rt Pressure (mmHg)IndexWaveform Comment                +---------+------------------+-----+---------+----------------------+ Brachial                                 stimulaor patch on arm +---------+------------------+-----+---------+----------------------+ ATA      137               1.29 triphasic                       +---------+------------------+-----+---------+----------------------+ PTA      172               1.62 triphasic                       +---------+------------------+-----+---------+----------------------+ Great Toe125               1.18 Normal                          +---------+------------------+-----+---------+----------------------+  +---------+------------------+-----+----------+-------+ Left     Lt Pressure (mmHg)IndexWaveform  Comment +---------+------------------+-----+----------+-------+ Brachial 106                                       +---------+------------------+-----+----------+-------+ ATA      94                0.89 monophasic        +---------+------------------+-----+----------+-------+ PTA      109               1.03 monophasic        +---------+------------------+-----+----------+-------+ Great Toe109               1.03                   +---------+------------------+-----+----------+-------+  +-------+-----------+-----------+------------+------------+ ABI/TBIToday's ABIToday's TBIPrevious ABIPrevious TBI +-------+-----------+-----------+------------+------------+ Right  [place]         1.16       1.28        .92          +-------+-----------+-----------+------------+------------+ Left   1.01       1.01       .91         .71          +-------+-----------+-----------+------------+------------+  Bilateral ABIs appear essentially unchanged compared to prior study on 12/02/2019. Bilateral TBIs appear essentially unchanged compared to prior study on 12/02/2019.   Summary: Right: Resting right ankle-brachial index indicates noncompressible right lower extremity arteries. The right toe-brachial index is normal.  Left: Resting left ankle-brachial index is within normal range. No evidence of significant left lower extremity arterial disease. The left toe-brachial index is normal.  *See table(s) above for measurements and observations.    Electronically signed by Hortencia Pilar MD on 06/04/2020 at 3:43:06 PM.       Final   Note: Reviewed        Physical Exam  General appearance: Well nourished, well developed, and well hydrated. In no apparent acute distress Mental status: Alert, oriented x 3 (person, place, & time)  Respiratory: No evidence of acute respiratory distress Eyes: PERLA Vitals: BP 97/66 (BP Location: Right Arm, Patient Position: Sitting, Cuff Size: Normal)   Pulse 80   Temp (!) 97.3 F (36.3 C) (Temporal)   Resp 16   Ht '5\' 7"'  (1.702 m)   Wt 190 lb (86.2  kg)   SpO2 98%   BMI 29.76 kg/m  BMI: Estimated body mass index is 29.76 kg/m as calculated from the following:   Height as of this encounter: '5\' 7"'  (1.702 m).   Weight as of this encounter: 190 lb (86.2 kg). Ideal: Ideal body weight: 66.1 kg (145 lb 11.6 oz) Adjusted ideal body weight: 74.1 kg (163 lb 6.9 oz)  Upper Extremity (UE) Exam    Side: Right upper extremity  Side: Left upper extremity  Skin & Extremity Inspection: Skin color, temperature, and hair growth are WNL. No peripheral edema or cyanosis. No masses, redness, swelling, asymmetry, or associated skin lesions. No contractures.  Skin & Extremity Inspection: Skin color, temperature, and hair growth are WNL. No peripheral edema or cyanosis. No masses, redness, swelling, asymmetry, or associated skin lesions. No contractures.  Functional ROM: Improved after treatment for shoulder  Functional ROM: Unrestricted ROM          Muscle Tone/Strength: Functionally intact. No obvious neuro-muscular anomalies detected.  Muscle Tone/Strength: Functionally intact. No obvious neuro-muscular anomalies detected.  Sensory (Neurological): Improved affecting the shoulder  Sensory (Neurological): Unimpaired          Palpation: No palpable anomalies              Palpation: No palpable anomalies              Provocative Test(s):  Phalen's test: deferred Tinel's test: deferred Apley's scratch test (touch opposite shoulder):  Action 1 (Across chest): Improved ROM Action 2 (Overhead): Improved ROM Action 3 (LB reach): Improved ROM   Provocative Test(s):  Phalen's test: deferred Tinel's test: deferred Apley's scratch test (touch opposite shoulder):  Action 1 (Across chest): deferred Action 2 (Overhead): deferred Action 3 (LB reach): deferred   5 out of 5 strength bilateral upper extremity: Shoulder abduction, elbow flexion, elbow extension, thumb extension.   Assessment   Status Diagnosis  Controlled Controlled Controlled 1. Chronic right  shoulder pain   2. Primary osteoarthritis of right shoulder   3. Chronic pain of both shoulders   4. Congestive heart failure, unspecified HF chronicity, unspecified heart failure type (HCC)   5. Carotid stenosis, symptomatic w/o infarct, left   6. Chronic pain syndrome      Updated Problems: Problem  Chronic Right Shoulder Pain    Plan of Care    Successful removal of right axillary nerve Sprint peripheral nerve stimulation system.  Tip intact.  Sprint representative present for lead pull.  Patient will follow up for medication management next month.  Will monitor how he does in regards to his right shoulder pain after removal of Sprint peripheral nerve stimulation system.  Follow-up plan:   Return for Keep sch. appt.     s/p R GH shoulder joint injection 04/03/19, 08/26/19 (anterior approach), 03/23/2020- repeat prn; right axillary nerve PNS       Recent Visits Date Type Provider Dept  04/20/20 Procedure visit Gillis Santa, MD [facility]  Showing recent visits within past 90 days and meeting all other requirements Today's Visits Date Type Provider Dept  06/22/20 Procedure visit Gillis Santa, MD [facility]  Showing today's visits and meeting all other requirements Future  Appointments Date Type Provider Dept  07/14/20 Appointment Gillis Santa, MD [facility]  Showing future appointments within next 90 days and meeting all other requirements  I discussed the assessment and treatment plan with the patient. The patient was provided an opportunity to ask questions and all were answered. The patient agreed with the plan and demonstrated an understanding of the instructions.  Patient advised to call back or seek an in-person evaluation if the symptoms or condition worsens.  Duration of encounter: 30mnutes.  Note by: BGillis Santa MD Date: 06/22/2020; Time: 10:08 AM

## 2020-06-23 DIAGNOSIS — J439 Emphysema, unspecified: Secondary | ICD-10-CM | POA: Diagnosis not present

## 2020-06-23 DIAGNOSIS — Z01818 Encounter for other preprocedural examination: Secondary | ICD-10-CM | POA: Diagnosis not present

## 2020-06-23 DIAGNOSIS — J84112 Idiopathic pulmonary fibrosis: Secondary | ICD-10-CM | POA: Diagnosis not present

## 2020-06-23 DIAGNOSIS — R0600 Dyspnea, unspecified: Secondary | ICD-10-CM | POA: Diagnosis not present

## 2020-06-27 DIAGNOSIS — J449 Chronic obstructive pulmonary disease, unspecified: Secondary | ICD-10-CM | POA: Diagnosis not present

## 2020-06-27 DIAGNOSIS — J9601 Acute respiratory failure with hypoxia: Secondary | ICD-10-CM | POA: Diagnosis not present

## 2020-07-14 ENCOUNTER — Ambulatory Visit
Payer: Medicare HMO | Attending: Student in an Organized Health Care Education/Training Program | Admitting: Student in an Organized Health Care Education/Training Program

## 2020-07-14 ENCOUNTER — Other Ambulatory Visit: Payer: Self-pay

## 2020-07-14 ENCOUNTER — Encounter: Payer: Self-pay | Admitting: Student in an Organized Health Care Education/Training Program

## 2020-07-14 VITALS — BP 127/66 | HR 84 | Temp 97.2°F | Resp 18 | Ht 67.0 in | Wt 195.0 lb

## 2020-07-14 DIAGNOSIS — M25512 Pain in left shoulder: Secondary | ICD-10-CM | POA: Diagnosis not present

## 2020-07-14 DIAGNOSIS — G8929 Other chronic pain: Secondary | ICD-10-CM | POA: Diagnosis not present

## 2020-07-14 DIAGNOSIS — M25511 Pain in right shoulder: Secondary | ICD-10-CM | POA: Diagnosis not present

## 2020-07-14 DIAGNOSIS — G894 Chronic pain syndrome: Secondary | ICD-10-CM | POA: Diagnosis not present

## 2020-07-14 DIAGNOSIS — M19011 Primary osteoarthritis, right shoulder: Secondary | ICD-10-CM | POA: Diagnosis not present

## 2020-07-14 DIAGNOSIS — I509 Heart failure, unspecified: Secondary | ICD-10-CM

## 2020-07-14 DIAGNOSIS — I6522 Occlusion and stenosis of left carotid artery: Secondary | ICD-10-CM | POA: Diagnosis not present

## 2020-07-14 MED ORDER — HYDROCODONE-ACETAMINOPHEN 10-325 MG PO TABS: 1.0000 | ORAL_TABLET | Freq: Two times a day (BID) | ORAL | 0 refills | Status: DC | PRN

## 2020-07-14 MED ORDER — ACETAMINOPHEN 500 MG PO TABS: 500.0000 mg | ORAL_TABLET | Freq: Three times a day (TID) | ORAL | 2 refills | Status: DC | PRN

## 2020-07-14 NOTE — Progress Notes (Signed)
Nursing Pain Medication Assessment:  Safety precautions to be maintained throughout the outpatient stay will include: orient to surroundings, keep bed in low position, maintain call bell within reach at all times, provide assistance with transfer out of bed and ambulation.  Medication Inspection Compliance: Pill count conducted under aseptic conditions, in front of the patient. Neither the pills nor the bottle was removed from the patient's sight at any time. Once count was completed pills were immediately returned to the patient in their original bottle.  Medication: Hydrocodone/APAP Pill/Patch Count: 40 of 75 pills remain Pill/Patch Appearance: Markings consistent with prescribed medication Bottle Appearance: Standard pharmacy container. Clearly labeled. Filled Date: 12 /6 / 2021 Last Medication intake:  Today

## 2020-07-14 NOTE — Progress Notes (Signed)
PROVIDER NOTE: Information contained herein reflects review and annotations entered in association with encounter. Interpretation of such information and data should be left to medically-trained personnel. Information provided to patient can be located elsewhere in the medical record under "Patient Instructions". Document created using STT-dictation technology, any transcriptional errors that may result from process are unintentional.    Patient: [name]  Service Category: E/M  Provider: Gillis Santa, MD  DOB: 1960-09-12  DOS: 07/14/2020  Specialty: Interventional Pain Management  MRN: [id number]  Setting: Ambulatory outpatient  PCP: Tracie Harrier, MD  Type: Established Patient    Referring Provider: Tracie Harrier, MD  Location: Office  Delivery: Face-to-face     HPI  [name], a 59 y.o. year old male, is here today because of his Chronic right shoulder pain [M25.511, G89.29]. Mr. Seder primary complain today is Knee Pain (bilateral), Shoulder Pain (bilateral), and Joint Pain Last encounter: My last encounter with him was on 06/22/2020. Pertinent problems: [name] has PAD (peripheral artery disease) ([place]); Carotid stenosis; Carotid stenosis, symptomatic w/o infarct, left; CAD (coronary artery disease); Chronic right shoulder pain; S/P CABG (coronary artery bypass graft); DM (diabetes mellitus) ([place]); and Chronic pain syndrome on their pertinent problem list. Pain Assessment: Severity of Chronic pain is reported as a 5 /10. Location: Knee Right,Left/radiates to feet at night. Onset: More than a month ago. Quality: Aching,Sharp,Shooting. Timing: Constant. Modifying factor(s): nothing. Vitals:  height is _0  (1.702 m) and weight is 195 lb (88.5 kg). His temperature is 97.2 F (36.2 C) (abnormal). His blood pressure is 127/66 and his pulse is 84. His respiration is 18 and oxygen saturation is 99%.   Reason for encounter: medication management.   No change in medical  history since last visit.  Patient's pain is at baseline.  Patient continuespain regimen as prescribed.  States that it provides pain relief and improvement in functional status. Patient continues to endorse benefit status post Sprint peripheral nerve stimulation of his right axillary nerve for persistent right shoulder pain.  He states that the bony pain is still present but feels that the musculoskeletal pain around her shoulder has improved and also endorses improvement in his range of motion.  Pharmacotherapy Assessment   Analgesic: Norco 10 mg 3 times daily as needed, quantity 75/month MME equals 25   Monitoring: [place] PMP: PDMP reviewed during this encounter.       Pharmacotherapy: No side-effects or adverse reactions reported. Compliance: No problems identified. Effectiveness: Clinically acceptable.  Dewayne Shorter, RN  07/14/2020  8:51 AM  Signed Nursing Pain Medication Assessment:  Safety precautions to be maintained throughout the outpatient stay will include: orient to surroundings, keep bed in low position, maintain call bell within reach at all times, provide assistance with transfer out of bed and ambulation.  Medication Inspection Compliance: Pill count conducted under aseptic conditions, in front of the patient. Neither the pills nor the bottle was removed from the patient's sight at any time. Once count was completed pills were immediately returned to the patient in their original bottle.  Medication: Hydrocodone/APAP Pill/Patch Count: 40 of 75 pills remain Pill/Patch Appearance: Markings consistent with prescribed medication Bottle Appearance: Standard pharmacy container. Clearly labeled. Filled Date: 12 /6 / 2021 Last Medication intake:  Today    UDS:  Summary  Date Value Ref Range Status  11/21/2019 Note  Final    Comment:    ==================================================================== Compliance Drug Analysis,  Ur ==================================================================== Test  Result       Flag       Units Drug Present and Declared for Prescription Verification   Hydrocodone                    993          EXPECTED   ng/mg creat   Hydromorphone                  507          EXPECTED   ng/mg creat   Norhydrocodone                 2376         EXPECTED   ng/mg creat    Sources of hydrocodone include scheduled prescription medications.    Hydromorphone and norhydrocodone are expected metabolites of    hydrocodone. Hydromorphone is also available as a scheduled    prescription medication.   Acetaminophen                  PRESENT      EXPECTED Drug Present not Declared for Prescription Verification   Naproxen                       PRESENT      UNEXPECTED Drug Absent but Declared for Prescription Verification   Diclofenac                     Not Detected UNEXPECTED    Topical diclofenac, as indicated in the declared medication list, is    not always detected even when used as directed.   Salicylate                     Not Detected UNEXPECTED    Aspirin, as indicated in the declared medication list, is not always    detected even when used as directed. ==================================================================== Test                      Result    Flag   Units      Ref Range   Creatinine              42               mg/dL      >=20 ==================================================================== Declared Medications:  The flagging and interpretation on this report are based on the  following declared medications.  Unexpected results may arise from  inaccuracies in the declared medications.  **Note: The testing scope of this panel includes these medications:  Hydrocodone  **Note: The testing scope of this panel does not include small to  moderate amounts of these reported medications:  Acetaminophen  Aspirin  Topical Diclofenac  **Note: The  testing scope of this panel does not include the  following reported medications:  Albuterol  Carvedilol (Coreg)  Furosemide  Glipizide  Liraglutide (Victoza)  Lisinopril  Metformin  Metolazone  Pirfenidone (Esbriet)  Pravastatin  Sodium Chloride  Spironolactone  Tamsulosin  Tiotropium (Spiriva) ==================================================================== For clinical consultation, please call [phone]. ====================================================================      ROS  Constitutional: Denies any fever or chills Gastrointestinal: No reported hemesis, hematochezia, vomiting, or acute GI distress Musculoskeletal: Denies any acute onset joint swelling, redness, loss of ROM, or weakness Neurological: No reported episodes of acute onset apraxia, aphasia, dysarthria, agnosia, amnesia, paralysis, loss of coordination, or loss of consciousness  Medication Review  HYDROcodone-acetaminophen, Pirfenidone, acetaminophen, albuterol, aspirin EC, carvedilol, furosemide, metFORMIN, pravastatin, sodium chloride, and tamsulosin  History Review  Allergy: Mr. Cohen has No Known Allergies. Drug: Mr. Olano  reports no history of drug use. Alcohol:  reports previous alcohol use. Tobacco:  reports that he quit smoking about 6 years ago. His smoking use included cigarettes. He has never used smokeless tobacco. Social: Mr. Shipes  reports that he quit smoking about 6 years ago. His smoking use included cigarettes. He has never used smokeless tobacco. He reports previous alcohol use. He reports that he does not use drugs. Medical:  has a past medical history of Cancer ([place]), CHF (congestive heart failure) ([place]), COPD (chronic obstructive pulmonary disease) ([place]), Coronary artery disease, Diabetes mellitus without complication ([place]), Dyspnea, Fibromyalgia, History of kidney stones, MI (myocardial infarction) ([place]), Peripheral vascular disease ([place]), and Pneumonia. Surgical: Mr.  Payson  has a past surgical history that includes Cardiac catheterization (N/A, 06/09/2015); Cardiac catheterization (06/09/2015); Coronary artery bypass graft (2015); Cardiac catheterization; Application if wound vac; Wound debridement; stent in leg (Left); Endarterectomy (Left, 09/08/2017); and Lower Extremity Angiography (Right, 01/29/2019). Family: family history includes Cancer in his father; Heart attack in his mother.  Laboratory Chemistry Profile   Renal Lab Results  Component Value Date   BUN 21 (H) 01/29/2019   CREATININE 1.01 01/29/2019   GFRAA >60 01/29/2019   GFRNONAA >60 01/29/2019     Hepatic Lab Results  Component Value Date   AST 21 08/17/2017   ALT 20 08/17/2017   ALBUMIN 3.7 08/17/2017   ALKPHOS 44 08/17/2017     Electrolytes Lab Results  Component Value Date   NA 137 07/23/2018   K 4.2 07/23/2018   CL 102 07/23/2018   CALCIUM 8.5 (L) 07/23/2018   MG 2.0 07/23/2018   PHOS 4.4 09/10/2017     Bone No results found for: VD25OH, VD125OH2TOT, MB8675QG9, EE1007HQ1, 25OHVITD1, 25OHVITD2, 25OHVITD3, TESTOFREE, TESTOSTERONE   Inflammation (CRP: Acute Phase) (ESR: Chronic Phase) Lab Results  Component Value Date   LATICACIDVEN 1.4 09/09/2017       Note: Above Lab results reviewed.  Recent Imaging Review  VAS [place] LOWER EXTREMITY ARTERIAL DUPLEX LOWER EXTREMITY ARTERIAL DUPLEX STUDY   Current ABI: Right = [place] Lt = 1.01  Comparison Study: 12/02/2019  Performing Technologist: Charlane Ferretti RT (R)(VS)    Examination Guidelines: A complete evaluation includes B-mode imaging, spectral Doppler, color Doppler, and power Doppler as needed of all accessible portions of each vessel. Bilateral testing is considered an integral part of a complete examination. Limited examinations for reoccurring indications may be performed as noted.    +-----------+--------+-----+--------+----------+---------------------+  RIGHT       PSV cm/s Ratio Stenosis Waveform   Comments                +-----------+--------+-----+--------+----------+---------------------+  CFA Distal  240                     biphasic                          +-----------+--------+-----+--------+----------+---------------------+  DFA         240                     triphasic                         +-----------+--------+-----+--------+----------+---------------------+  SFA Prox    172  triphasic  Stent                  +-----------+--------+-----+--------+----------+---------------------+  SFA Mid     243                     triphasic  Stent Diameter change  +-----------+--------+-----+--------+----------+---------------------+  SFA Distal  115                     biphasic   Stent                  +-----------+--------+-----+--------+----------+---------------------+  POP Prox    160                     biphasic   Stent                  +-----------+--------+-----+--------+----------+---------------------+  POP Distal  91                      triphasic                         +-----------+--------+-----+--------+----------+---------------------+  ATA Distal  69                      biphasic                          +-----------+--------+-----+--------+----------+---------------------+  PTA Distal  56                      biphasic                          +-----------+--------+-----+--------+----------+---------------------+  PERO Distal 13                      monophasic                        +-----------+--------+-----+--------+----------+---------------------+    Right Stent(s): +---------------+---++---------+------------------+  Prox to Stent   152  triphasic                     +---------------+---++---------+------------------+  Proximal Stent  172  triphasic                     +---------------+---++---------+------------------+  Mid Stent       243  triphasic Change in diameter  +---------------+---++---------+------------------+  Distal Stent    115  triphasic                      +---------------+---++---------+------------------+  Distal to Stent 91   triphasic                     +---------------+---++---------+------------------+    Summary: Right: 50-74% stenosis noted in the deep femoral artery. 50-74% stenosis noted in the superficial femoral artery. Right mid SFA velocities are elevated possibly due to change in diameter from .53cm to .23cm as described in the previous exam on 12/02/2019.    See table(s) above for measurements and observations.  Electronically signed by Hortencia Pilar MD on 06/04/2020 at 3:43:14 PM.       Final   VAS [place] ABI WITH/WO TBI LOWER EXTREMITY DOPPLER STUDY  Indications: Peripheral artery disease.  Other Factors: 01/29/2019 right atherectomy. sfa-pop stent and  pta of eia.  Vascular Interventions: 05/14/13: Right CIA & left EIA stents with right CFA                         PTAs;                         06/17/15: Right CFA PTA;                         01/29/2019 PTA and stent right SFA and popliteal artery.                         PTA right EIA.  Comparison Study: 12/02/2019  Performing Technologist: Charlane Ferretti RT (R)(VS)    Examination Guidelines: A complete evaluation includes at minimum, Doppler waveform signals and systolic blood pressure reading at the level of bilateral brachial, anterior tibial, and posterior tibial arteries, when vessel segments are accessible. Bilateral testing is considered an integral part of a complete examination. Photoelectric Plethysmograph (PPG) waveforms and toe systolic pressure readings are included as required and additional duplex testing as needed. Limited examinations for reoccurring indications may be performed as noted.    ABI Findings: +---------+------------------+-----+---------+----------------------+  Right     Rt Pressure (mmHg) Index Waveform  Comment                 +---------+------------------+-----+---------+----------------------+  Brachial                                      stimulaor patch on arm  +---------+------------------+-----+---------+----------------------+  ATA       137                1.29  triphasic                         +---------+------------------+-----+---------+----------------------+  PTA       172                1.62  triphasic                         +---------+------------------+-----+---------+----------------------+  Great Toe 125                1.18  Normal                            +---------+------------------+-----+---------+----------------------+  +---------+------------------+-----+----------+-------+  Left      Lt Pressure (mmHg) Index Waveform   Comment  +---------+------------------+-----+----------+-------+  Brachial  106                                          +---------+------------------+-----+----------+-------+  ATA       94                 0.89  monophasic          +---------+------------------+-----+----------+-------+  PTA       109                1.03  monophasic          +---------+------------------+-----+----------+-------+  Great Toe 109  1.03                      +---------+------------------+-----+----------+-------+  +-------+-----------+-----------+------------+------------+  ABI/TBI Today's ABI Today's TBI Previous ABI Previous TBI  +-------+-----------+-----------+------------+------------+  Right   [place]          1.16        1.28         .92           +-------+-----------+-----------+------------+------------+  Left    1.01        1.01        .91          .71           +-------+-----------+-----------+------------+------------+  Bilateral ABIs appear essentially unchanged compared to prior study on 12/02/2019. Bilateral TBIs appear essentially unchanged compared to prior study on 12/02/2019.   Summary: Right: Resting right ankle-brachial index indicates noncompressible right lower extremity arteries. The right toe-brachial index is normal.  Left: Resting  left ankle-brachial index is within normal range. No evidence of significant left lower extremity arterial disease. The left toe-brachial index is normal.  *See table(s) above for measurements and observations.    Electronically signed by Hortencia Pilar MD on 06/04/2020 at 3:43:06 PM.       Final   Note: Reviewed        Physical Exam  General appearance: Well nourished, well developed, and well hydrated. In no apparent acute distress Mental status: Alert, oriented x 3 (person, place, & time)       Respiratory: No evidence of acute respiratory distress Eyes: PERLA Vitals: BP 127/66    Pulse 84    Temp (!) 97.2 F (36.2 C)    Resp 18    Ht _0  (1.702 m)    Wt 195 lb (88.5 kg)    SpO2 99%    BMI 30.54 kg/m  BMI: Estimated body mass index is 30.54 kg/m as calculated from the following:   Height as of this encounter: _1  (1.702 m).   Weight as of this encounter: 195 lb (88.5 kg). Ideal: Ideal body weight: 66.1 kg (145 lb 11.6 oz) Adjusted ideal body weight: 75 kg (165 lb 6.9 oz)   Cervical Spine Area Exam  Skin & Axial Inspection: No masses, redness, edema, swelling, or associated skin lesions Alignment: Symmetrical Functional ROM: Unrestricted ROM      Stability: No instability detected Muscle Tone/Strength: Functionally intact. No obvious neuro-muscular anomalies detected. Sensory (Neurological): Unimpaired   Upper Extremity (UE) Exam    Side: Right upper extremity  Side: Left upper extremity  Skin & Extremity Inspection: Skin color, temperature, and hair growth are WNL. No peripheral edema or cyanosis. No masses, redness, swelling, asymmetry, or associated skin lesions. No contractures.  Skin & Extremity Inspection: Skin color, temperature, and hair growth are WNL. No peripheral edema or cyanosis. No masses, redness, swelling, asymmetry, or associated skin lesions. No contractures.  Functional ROM: Pain restricted ROM for shoulder, improved after axillary nerve  stimulation  Functional ROM: Unrestricted ROM          Muscle Tone/Strength: Functionally intact. No obvious neuro-muscular anomalies detected.  Muscle Tone/Strength: Functionally intact. No obvious neuro-muscular anomalies detected.  Sensory (Neurological): Arthropathic arthralgia, improved after axillary nerve stimulation          Sensory (Neurological): Unimpaired          Palpation: No palpable anomalies              Palpation: No palpable  anomalies              Provocative Test(s):  Phalen's test: deferred Tinel's test: deferred Apley's scratch test (touch opposite shoulder):  Action 1 (Across chest): Decreased ROM, improved after axillary nerve stimulation Action 2 (Overhead): Decreased ROM, improved after actually nerve stimulation Action 3 (LB reach): Decreased ROM, improved after axillary stimulation   Provocative Test(s):  Phalen's test: deferred Tinel's test: deferred Apley's scratch test (touch opposite shoulder):  Action 1 (Across chest): deferred Action 2 (Overhead): deferred Action 3 (LB reach): deferred     Assessment   Status Diagnosis  Controlled Controlled Controlled 1. Chronic right shoulder pain   2. Primary osteoarthritis of right shoulder   3. Chronic pain of both shoulders   4. Congestive heart failure, unspecified HF chronicity, unspecified heart failure type (HCC)   5. Carotid stenosis, symptomatic w/o infarct, left   6. Chronic pain syndrome       Plan of Care  Mr. ANH MANGANO has a current medication list which includes the following long-term medication(s): albuterol, carvedilol, furosemide, [START ON 07/29/2020] hydrocodone-acetaminophen, metformin, metformin, pravastatin, sodium chloride, [START ON 08/28/2020] hydrocodone-acetaminophen, and [START ON 09/27/2020] hydrocodone-acetaminophen.  Patient was counseled on the meeting his acetaminophen intake.  In addition to taking hydrocodone 2-3 times a day as needed, I informed the patient to limit  his acetaminophen intake beyond that to 500 mg up to 3 times a day as needed.  I do not want the patient to exceed a total daily dose of 3 g of acetaminophen.  I explained to the patient that he is at an increased risk of liver dysfunction with such high acetaminophen usage especially on a daily basis.  He understood and states that he will limit his acetaminophen intake.  Pharmacotherapy (Medications Ordered): Meds ordered this encounter  Medications   HYDROcodone-acetaminophen (NORCO) 10-325 MG tablet    Sig: Take 1-2 tablets by mouth 2 (two) times daily as needed for severe pain. Must last 30 days.    Dispense:  75 tablet    Refill:  0    Chronic Pain. (STOP Act - Not applicable). Fill one day early if closed on scheduled refill date.   HYDROcodone-acetaminophen (NORCO) 10-325 MG tablet    Sig: Take 1-2 tablets by mouth 2 (two) times daily as needed for severe pain. Must last 30 days.    Dispense:  75 tablet    Refill:  0    Chronic Pain. (STOP Act - Not applicable). Fill one day early if closed on scheduled refill date.   HYDROcodone-acetaminophen (NORCO) 10-325 MG tablet    Sig: Take 1-2 tablets by mouth 2 (two) times daily as needed for severe pain. Must last 30 days.    Dispense:  75 tablet    Refill:  0    Chronic Pain. (STOP Act - Not applicable). Fill one day early if closed on scheduled refill date.   acetaminophen (TYLENOL) 500 MG tablet    Sig: Take 1 tablet (500 mg total) by mouth every 8 (eight) hours as needed for moderate pain or headache.    Dispense:  90 tablet    Refill:  2   Follow-up plan:   Return in about 14 weeks (around 10/20/2020) for Medication Management, in person.     s/p R GH shoulder joint injection 04/03/19, 08/26/19 (anterior approach), 03/23/2020- repeat prn; s/p right axillary nerve PNS        Recent Visits Date Type Provider Dept  06/22/20 Procedure visit Derrious Bologna,  Carlus Pavlov, MD [facility]  04/20/20 Procedure visit Gillis Santa, MD [facility]  Showing recent visits within past 90 days and meeting all other requirements Today's Visits Date Type Provider Dept  07/14/20 Office Visit Gillis Santa, MD [facility]  Showing today's visits and meeting all other requirements Future Appointments No visits were found meeting these conditions. Showing future appointments within next 90 days and meeting all other requirements  I discussed the assessment and treatment plan with the patient. The patient was provided an opportunity to ask questions and all were answered. The patient agreed with the plan and demonstrated an understanding of the instructions.  Patient advised to call back or seek an in-person evaluation if the symptoms or condition worsens.  Duration of encounter: 30 minutes.  Note by: Gillis Santa, MD Date: 07/14/2020; Time: 9:12 AM

## 2020-07-28 DIAGNOSIS — J449 Chronic obstructive pulmonary disease, unspecified: Secondary | ICD-10-CM | POA: Diagnosis not present

## 2020-07-28 DIAGNOSIS — J9601 Acute respiratory failure with hypoxia: Secondary | ICD-10-CM | POA: Diagnosis not present

## 2020-08-28 DIAGNOSIS — J449 Chronic obstructive pulmonary disease, unspecified: Secondary | ICD-10-CM | POA: Diagnosis not present

## 2020-08-28 DIAGNOSIS — J9601 Acute respiratory failure with hypoxia: Secondary | ICD-10-CM | POA: Diagnosis not present

## 2020-09-08 DIAGNOSIS — J9611 Chronic respiratory failure with hypoxia: Secondary | ICD-10-CM | POA: Diagnosis not present

## 2020-09-08 DIAGNOSIS — I2510 Atherosclerotic heart disease of native coronary artery without angina pectoris: Secondary | ICD-10-CM | POA: Diagnosis not present

## 2020-09-08 DIAGNOSIS — E1165 Type 2 diabetes mellitus with hyperglycemia: Secondary | ICD-10-CM | POA: Diagnosis not present

## 2020-09-08 DIAGNOSIS — E785 Hyperlipidemia, unspecified: Secondary | ICD-10-CM | POA: Diagnosis not present

## 2020-09-08 DIAGNOSIS — I9789 Other postprocedural complications and disorders of the circulatory system, not elsewhere classified: Secondary | ICD-10-CM | POA: Diagnosis not present

## 2020-09-08 DIAGNOSIS — I5020 Unspecified systolic (congestive) heart failure: Secondary | ICD-10-CM | POA: Diagnosis not present

## 2020-09-08 DIAGNOSIS — Z951 Presence of aortocoronary bypass graft: Secondary | ICD-10-CM | POA: Diagnosis not present

## 2020-09-08 DIAGNOSIS — I429 Cardiomyopathy, unspecified: Secondary | ICD-10-CM | POA: Diagnosis not present

## 2020-09-08 DIAGNOSIS — J439 Emphysema, unspecified: Secondary | ICD-10-CM | POA: Diagnosis not present

## 2020-09-21 DIAGNOSIS — Z79899 Other long term (current) drug therapy: Secondary | ICD-10-CM | POA: Diagnosis not present

## 2020-09-21 DIAGNOSIS — R0600 Dyspnea, unspecified: Secondary | ICD-10-CM | POA: Diagnosis not present

## 2020-09-21 DIAGNOSIS — J84112 Idiopathic pulmonary fibrosis: Secondary | ICD-10-CM | POA: Diagnosis not present

## 2020-09-21 DIAGNOSIS — J449 Chronic obstructive pulmonary disease, unspecified: Secondary | ICD-10-CM | POA: Diagnosis not present

## 2020-09-25 DIAGNOSIS — J9601 Acute respiratory failure with hypoxia: Secondary | ICD-10-CM | POA: Diagnosis not present

## 2020-09-25 DIAGNOSIS — J449 Chronic obstructive pulmonary disease, unspecified: Secondary | ICD-10-CM | POA: Diagnosis not present

## 2020-10-20 ENCOUNTER — Other Ambulatory Visit: Payer: Self-pay

## 2020-10-20 ENCOUNTER — Encounter: Payer: Self-pay | Admitting: Student in an Organized Health Care Education/Training Program

## 2020-10-20 ENCOUNTER — Ambulatory Visit
Payer: Medicare HMO | Attending: Student in an Organized Health Care Education/Training Program | Admitting: Student in an Organized Health Care Education/Training Program

## 2020-10-20 VITALS — BP 114/82 | HR 110 | Temp 97.2°F | Resp 16 | Ht 67.0 in | Wt 198.0 lb

## 2020-10-20 DIAGNOSIS — I739 Peripheral vascular disease, unspecified: Secondary | ICD-10-CM | POA: Insufficient documentation

## 2020-10-20 DIAGNOSIS — G894 Chronic pain syndrome: Secondary | ICD-10-CM | POA: Insufficient documentation

## 2020-10-20 DIAGNOSIS — I6522 Occlusion and stenosis of left carotid artery: Secondary | ICD-10-CM | POA: Insufficient documentation

## 2020-10-20 DIAGNOSIS — I509 Heart failure, unspecified: Secondary | ICD-10-CM | POA: Insufficient documentation

## 2020-10-20 DIAGNOSIS — I6523 Occlusion and stenosis of bilateral carotid arteries: Secondary | ICD-10-CM | POA: Insufficient documentation

## 2020-10-20 DIAGNOSIS — G8929 Other chronic pain: Secondary | ICD-10-CM | POA: Diagnosis not present

## 2020-10-20 DIAGNOSIS — M25511 Pain in right shoulder: Secondary | ICD-10-CM | POA: Diagnosis not present

## 2020-10-20 DIAGNOSIS — E1142 Type 2 diabetes mellitus with diabetic polyneuropathy: Secondary | ICD-10-CM | POA: Diagnosis not present

## 2020-10-20 DIAGNOSIS — M19011 Primary osteoarthritis, right shoulder: Secondary | ICD-10-CM | POA: Diagnosis not present

## 2020-10-20 DIAGNOSIS — M25512 Pain in left shoulder: Secondary | ICD-10-CM | POA: Insufficient documentation

## 2020-10-20 MED ORDER — HYDROCODONE-ACETAMINOPHEN 10-325 MG PO TABS: 1.0000 | ORAL_TABLET | Freq: Two times a day (BID) | ORAL | 0 refills | Status: DC | PRN

## 2020-10-20 NOTE — Progress Notes (Signed)
PROVIDER NOTE: Information contained herein reflects review and annotations entered in association with encounter. Interpretation of such information and data should be left to medically-trained personnel. Information provided to patient can be located elsewhere in the medical record under "Patient Instructions". Document created using STT-dictation technology, any transcriptional errors that may result from process are unintentional.    Patient: [name]  Service Category: E/M  Provider: Gillis Santa, MD  DOB: [birth year]  DOS: 10/20/2020  Specialty: Interventional Pain Management  MRN: [id number]  Setting: Ambulatory outpatient  PCP: Tracie Harrier, MD  Type: Established Patient    Referring Provider: Tracie Harrier, MD  Location: Office  Delivery: Face-to-face     HPI  [name], a 60 y.o. year old male, is here today because of his Chronic right shoulder pain [M25.511, G89.29]. [name] primary complain today is Shoulder Pain (biilateral) and Leg Pain (Bilateral, lower) Last encounter: My last encounter with him was on 07/14/2020. Pertinent problems: [name] has PAD (peripheral artery disease) ([place]); Carotid stenosis; Carotid stenosis, symptomatic w/o infarct, left; CAD (coronary artery disease); Chronic right shoulder pain; S/P CABG (coronary artery bypass graft); DM (diabetes mellitus) ([place]); and Chronic pain syndrome on their pertinent problem list. Pain Assessment: Severity of Chronic pain is reported as a 7 /10. Location: Shoulder Right,Left/ . Onset: More than a month ago. Quality: Throbbing,Dull. Timing: Constant. Modifying factor(s): ice, heat, Hydrocodone. Vitals:  height is '5\' 7"'  (1.702 m) and weight is 198 lb (89.8 kg). His temporal temperature is 97.2 F (36.2 C) (abnormal). His blood pressure is 114/82 and his pulse is 110 (abnormal). His respiration is 16 and oxygen saturation is 98%.   Reason for encounter: medication management.    States that he has been  dealing with low BP, does have dizziness at time (no falls), encouraged to follow up with PCP. Continuing to endorse pain relief from right axillary nerve stim procedure endorses improvement in ROM  Pharmacotherapy Assessment   Analgesic: Norco 10 mg 3 times daily as needed, quantity 75/month MME equals 25   Monitoring: [place] PMP: PDMP reviewed during this encounter.       Pharmacotherapy: No side-effects or adverse reactions reported. Compliance: No problems identified. Effectiveness: Clinically acceptable.  Landis Martins, RN  10/20/2020  8:45 AM  Sign when Signing Visit Nursing Pain Medication Assessment:  Safety precautions to be maintained throughout the outpatient stay will include: orient to surroundings, keep bed in low position, maintain call bell within reach at all times, provide assistance with transfer out of bed and ambulation.  Medication Inspection Compliance: Pill count conducted under aseptic conditions, in front of the patient. Neither the pills nor the bottle was removed from the patient's sight at any time. Once count was completed pills were immediately returned to the patient in their original bottle.  Medication: Hydrocodone/APAP Pill/Patch Count: 20 of 75 pills remain Pill/Patch Appearance: Markings consistent with prescribed medication Bottle Appearance: Standard pharmacy container. Clearly labeled. Filled Date: 09/28/2020 Last Medication intake:  Today    UDS:  Summary  Date Value Ref Range Status  11/21/2019 Note  Final    Comment:    ==================================================================== Compliance Drug Analysis, Ur ==================================================================== Test                             Result       Flag       Units Drug Present and Declared for Prescription Verification   Hydrocodone  993          EXPECTED   ng/mg creat   Hydromorphone                  507          EXPECTED   ng/mg creat    Norhydrocodone                 2376         EXPECTED   ng/mg creat    Sources of hydrocodone include scheduled prescription medications.    Hydromorphone and norhydrocodone are expected metabolites of    hydrocodone. Hydromorphone is also available as a scheduled    prescription medication.   Acetaminophen                  PRESENT      EXPECTED Drug Present not Declared for Prescription Verification   Naproxen                       PRESENT      UNEXPECTED Drug Absent but Declared for Prescription Verification   Diclofenac                     Not Detected UNEXPECTED    Topical diclofenac, as indicated in the declared medication list, is    not always detected even when used as directed.   Salicylate                     Not Detected UNEXPECTED    Aspirin, as indicated in the declared medication list, is not always    detected even when used as directed. ==================================================================== Test                      Result    Flag   Units      Ref Range   Creatinine              42               mg/dL      >=20 ==================================================================== Declared Medications:  The flagging and interpretation on this report are based on the  following declared medications.  Unexpected results may arise from  inaccuracies in the declared medications.  **Note: The testing scope of this panel includes these medications:  Hydrocodone  **Note: The testing scope of this panel does not include small to  moderate amounts of these reported medications:  Acetaminophen  Aspirin  Topical Diclofenac  **Note: The testing scope of this panel does not include the  following reported medications:  Albuterol  Carvedilol (Coreg)  Furosemide  Glipizide  Liraglutide (Victoza)  Lisinopril  Metformin  Metolazone  Pirfenidone (Esbriet)  Pravastatin  Sodium Chloride  Spironolactone  Tamsulosin  Tiotropium  (Spiriva) ==================================================================== For clinical consultation, please call [phone]. ====================================================================      ROS  Constitutional: Denies any fever or chills Gastrointestinal: No reported hemesis, hematochezia, vomiting, or acute GI distress Musculoskeletal: + right shouder pain Neurological: No reported episodes of acute onset apraxia, aphasia, dysarthria, agnosia, amnesia, paralysis, loss of coordination, or loss of consciousness  Medication Review  HYDROcodone-acetaminophen, Pirfenidone, acetaminophen, albuterol, aspirin EC, carvedilol, furosemide, metFORMIN, pravastatin, sodium chloride, spironolactone, and tamsulosin  History Review  Allergy: [name] has No Known Allergies. Drug: [name]  reports no history of drug use. Alcohol:  reports previous alcohol use. Tobacco:  reports that he quit smoking about  7 years ago. His smoking use included cigarettes. He has never used smokeless tobacco. Social: [name]  reports that he quit smoking about 7 years ago. His smoking use included cigarettes. He has never used smokeless tobacco. He reports previous alcohol use. He reports that he does not use drugs. Medical:  has a past medical history of Cancer ([place]), CHF (congestive heart failure) ([place]), COPD (chronic obstructive pulmonary disease) ([place]), Coronary artery disease, Diabetes mellitus without complication ([place]), Dyspnea, Fibromyalgia, History of kidney stones, MI (myocardial infarction) ([place]), Peripheral vascular disease ([place]), and Pneumonia. Surgical: [name]  has a past surgical history that includes Cardiac catheterization (N/A, 06/09/2015); Cardiac catheterization (06/09/2015); Coronary artery bypass graft (2015); Cardiac catheterization; Application if wound vac; Wound debridement; stent in leg (Left); Endarterectomy (Left, 09/08/2017); and Lower Extremity Angiography (Right,  01/29/2019). Family: family history includes Cancer in his father; Heart attack in his mother.  Laboratory Chemistry Profile   Renal Lab Results  Component Value Date   BUN 21 (H) 01/29/2019   CREATININE 1.01 01/29/2019   GFRAA >60 01/29/2019   GFRNONAA >60 01/29/2019     Hepatic Lab Results  Component Value Date   AST 21 08/17/2017   ALT 20 08/17/2017   ALBUMIN 3.7 08/17/2017   ALKPHOS 44 08/17/2017     Electrolytes Lab Results  Component Value Date   NA 137 07/23/2018   K 4.2 07/23/2018   CL 102 07/23/2018   CALCIUM 8.5 (L) 07/23/2018   MG 2.0 07/23/2018   PHOS 4.4 09/10/2017     Bone No results found for: VD25OH, VD125OH2TOT, SR1594VO5, FY9244QK8, 25OHVITD1, 25OHVITD2, 25OHVITD3, TESTOFREE, TESTOSTERONE   Inflammation (CRP: Acute Phase) (ESR: Chronic Phase) Lab Results  Component Value Date   LATICACIDVEN 1.4 09/09/2017       Note: Above Lab results reviewed.  Recent Imaging Review  VAS [place] LOWER EXTREMITY ARTERIAL DUPLEX LOWER EXTREMITY ARTERIAL DUPLEX STUDY   Current ABI: Right = [place] Lt = 1.01  Comparison Study: 12/02/2019  Performing Technologist: Charlane Ferretti RT (R)(VS)    Examination Guidelines: A complete evaluation includes B-mode imaging, spectral Doppler, color Doppler, and power Doppler as needed of all accessible portions of each vessel. Bilateral testing is considered an integral part of a complete examination. Limited examinations for reoccurring indications may be performed as noted.    +-----------+--------+-----+--------+----------+---------------------+ RIGHT      PSV cm/sRatioStenosisWaveform  Comments              +-----------+--------+-----+--------+----------+---------------------+ CFA Distal 240                  biphasic                        +-----------+--------+-----+--------+----------+---------------------+ DFA        240                  triphasic                        +-----------+--------+-----+--------+----------+---------------------+ SFA Prox   172                  triphasic Stent                 +-----------+--------+-----+--------+----------+---------------------+ SFA Mid    243                  triphasic Stent Diameter change +-----------+--------+-----+--------+----------+---------------------+ SFA Distal 115  biphasic  Stent                 +-----------+--------+-----+--------+----------+---------------------+ POP Prox   160                  biphasic  Stent                 +-----------+--------+-----+--------+----------+---------------------+ POP Distal 91                   triphasic                       +-----------+--------+-----+--------+----------+---------------------+ ATA Distal 69                   biphasic                        +-----------+--------+-----+--------+----------+---------------------+ PTA Distal 56                   biphasic                        +-----------+--------+-----+--------+----------+---------------------+ PERO Distal13                   monophasic                      +-----------+--------+-----+--------+----------+---------------------+    Right Stent(s): +---------------+---++---------+------------------+ Prox to Stent  152triphasic                   +---------------+---++---------+------------------+ Proximal Stent 172triphasic                   +---------------+---++---------+------------------+ Mid Stent      243triphasicChange in diameter +---------------+---++---------+------------------+ Distal Stent   115triphasic                   +---------------+---++---------+------------------+ Distal to Stent91 triphasic                   +---------------+---++---------+------------------+    Summary: Right: 50-74% stenosis noted in the deep femoral artery. 50-74% stenosis noted in the superficial  femoral artery. Right mid SFA velocities are elevated possibly due to change in diameter from .53cm to .23cm as described in the previous exam on 12/02/2019.    See table(s) above for measurements and observations.  Electronically signed by Hortencia Pilar MD on 06/04/2020 at 3:43:14 PM.       Final   VAS [place] ABI WITH/WO TBI LOWER EXTREMITY DOPPLER STUDY  Indications: Peripheral artery disease.  Other Factors: 01/29/2019 right atherectomy. sfa-pop stent and pta of eia.  Vascular Interventions: 05/14/13: Right CIA & left EIA stents with right CFA                         PTAs;                         06/17/15: Right CFA PTA;                         01/29/2019 PTA and stent right SFA and popliteal artery.                         PTA right EIA.  Comparison Study: 12/02/2019  Performing Technologist: Charlane Ferretti RT (R)(VS)    Examination Guidelines: A complete evaluation  includes at minimum, Doppler waveform signals and systolic blood pressure reading at the level of bilateral brachial, anterior tibial, and posterior tibial arteries, when vessel segments are accessible. Bilateral testing is considered an integral part of a complete examination. Photoelectric Plethysmograph (PPG) waveforms and toe systolic pressure readings are included as required and additional duplex testing as needed. Limited examinations for reoccurring indications may be performed as noted.    ABI Findings: +---------+------------------+-----+---------+----------------------+ Right    Rt Pressure (mmHg)IndexWaveform Comment                +---------+------------------+-----+---------+----------------------+ Brachial                                 stimulaor patch on arm +---------+------------------+-----+---------+----------------------+ ATA      137               1.29 triphasic                       +---------+------------------+-----+---------+----------------------+ PTA      172                1.62 triphasic                       +---------+------------------+-----+---------+----------------------+ Great Toe125               1.18 Normal                          +---------+------------------+-----+---------+----------------------+  +---------+------------------+-----+----------+-------+ Left     Lt Pressure (mmHg)IndexWaveform  Comment +---------+------------------+-----+----------+-------+ Brachial 106                                      +---------+------------------+-----+----------+-------+ ATA      94                0.89 monophasic        +---------+------------------+-----+----------+-------+ PTA      109               1.03 monophasic        +---------+------------------+-----+----------+-------+ Great Toe109               1.03                   +---------+------------------+-----+----------+-------+  +-------+-----------+-----------+------------+------------+ ABI/TBIToday's ABIToday's TBIPrevious ABIPrevious TBI +-------+-----------+-----------+------------+------------+ Right  [place]         1.16       1.28        .92          +-------+-----------+-----------+------------+------------+ Left   1.01       1.01       .91         .71          +-------+-----------+-----------+------------+------------+  Bilateral ABIs appear essentially unchanged compared to prior study on 12/02/2019. Bilateral TBIs appear essentially unchanged compared to prior study on 12/02/2019.   Summary: Right: Resting right ankle-brachial index indicates noncompressible right lower extremity arteries. The right toe-brachial index is normal.  Left: Resting left ankle-brachial index is within normal range. No evidence of significant left lower extremity arterial disease. The left toe-brachial index is normal.  *See table(s) above for measurements and observations.    Electronically signed by Hortencia Pilar MD on 06/04/2020 at 3:43:06  PM.  Final   Note: Reviewed        Physical Exam  General appearance: Well nourished, well developed, and well hydrated. In no apparent acute distress Mental status: Alert, oriented x 3 (person, place, & time)       Respiratory: No evidence of acute respiratory distress Eyes: PERLA Vitals: BP 114/82   Pulse (!) 110   Temp (!) 97.2 F (36.2 C) (Temporal)   Resp 16   Ht '5\' 7"'  (1.702 m)   Wt 198 lb (89.8 kg)   SpO2 98%   BMI 31.01 kg/m  BMI: Estimated body mass index is 31.01 kg/m as calculated from the following:   Height as of this encounter: '5\' 7"'  (1.702 m).   Weight as of this encounter: 198 lb (89.8 kg). Ideal: Ideal body weight: 66.1 kg (145 lb 11.6 oz) Adjusted ideal body weight: 75.6 kg (166 lb 10.1 oz)   Cervical Spine Area Exam  Skin & Axial Inspection:No masses, redness, edema, swelling, or associated skin lesions Alignment:Symmetrical Functional JSH:[id number] ROM Stability:No instability detected Muscle Tone/Strength:Functionally intact. No obvious neuro-muscular anomalies detected. Sensory (Neurological):Unimpaired  Upper Extremity (UE) Exam    Side:Right upper extremity  Side:Left upper extremity  Skin & Extremity Inspection:Skin color, temperature, and hair growth are WNL. No peripheral edema or cyanosis. No masses, redness, swelling, asymmetry, or associated skin lesions. No contractures.  Skin & Extremity Inspection:Skin color, temperature, and hair growth are WNL. No peripheral edema or cyanosis. No masses, redness, swelling, asymmetry, or associated skin lesions. No contractures.  Functional YDX:AJOI restricted ROMfor shoulder, improved after axillary nerve stimulation  Functional NOM:[id number] ROM  Muscle Tone/Strength:Functionally intact. No obvious neuro-muscular anomalies detected.  Muscle Tone/Strength:Functionally intact. No obvious neuro-muscular anomalies detected.  Sensory (Neurological):Arthropathic  arthralgia, improved after axillary nerve stimulation  Sensory (Neurological):Unimpaired  Palpation:No palpable anomalies  Palpation:No palpable anomalies  Provocative Test(s): Phalen's test:deferred Tinel's test:deferred Apley's scratch test (touch opposite shoulder): Action 1 (Across chest):Decreased ROM, improved after axillary nerve stimulation Action 2 (Overhead):Decreased ROM, improved after actually nerve stimulation Action 3 (LB reach):Decreased ROM, improved after axillary stimulation   Provocative Test(s): Phalen's test:deferred Tinel's test:deferred Apley's scratch test (touch opposite shoulder): Action 1 (Across chest):deferred Action 2 (Overhead):deferred Action 3 (LB reach):deferred   Assessment   Status Diagnosis  Controlled Controlled Controlled 1. Chronic right shoulder pain   2. Chronic pain syndrome   3. Primary osteoarthritis of right shoulder   4. Chronic pain of both shoulders   5. Congestive heart failure, unspecified HF chronicity, unspecified heart failure type ([place])   6. Carotid stenosis, symptomatic w/o infarct, left   7. Type 2 diabetes mellitus with peripheral neuropathy ([facility])   8. PAD (peripheral artery disease) ([place])   9. Bilateral carotid artery stenosis      Plan of Care    [name] has a current medication list which includes the following long-term medication(s): albuterol, carvedilol, furosemide, [START ON 10/28/2020] hydrocodone-acetaminophen, [START ON 11/27/2020] hydrocodone-acetaminophen, [START ON 12/27/2020] hydrocodone-acetaminophen, metformin, metformin, pravastatin, and sodium chloride.  Pharmacotherapy (Medications Ordered): Meds ordered this encounter  Medications  . HYDROcodone-acetaminophen (NORCO) 10-325 MG tablet    Sig: Take 1-2 tablets by mouth 2 (two) times daily as needed for severe pain. Must last 30 days.    Dispense:  75 tablet    Refill:  0     Chronic Pain. (STOP Act - Not applicable). Fill one day early if closed on scheduled refill date.  Marland Kitchen HYDROcodone-acetaminophen (NORCO) 10-325 MG tablet    Sig: Take  1-2 tablets by mouth 2 (two) times daily as needed for severe pain. Must last 30 days.    Dispense:  75 tablet    Refill:  0    Chronic Pain. (STOP Act - Not applicable). Fill one day early if closed on scheduled refill date.  Marland Kitchen HYDROcodone-acetaminophen (NORCO) 10-325 MG tablet    Sig: Take 1-2 tablets by mouth 2 (two) times daily as needed for severe pain. Must last 30 days.    Dispense:  75 tablet    Refill:  0    Chronic Pain. (STOP Act - Not applicable). Fill one day early if closed on scheduled refill date.   Orders:  Orders Placed This Encounter  Procedures  . ToxASSURE Select 13 (MW), Urine    Volume: 30 ml(s). Minimum 3 ml of urine is needed. Document temperature of fresh sample. Indications: Long term (current) use of opiate analgesic [phone])    Order Specific Question:   Release to patient    Answer:   Immediate   Follow-up plan:   Return in about 3 months (around 01/20/2021) for Medication Management, in person.     s/p R GH shoulder joint injection 04/03/19, 08/26/19 (anterior approach), 03/23/2020- repeat prn; s/p right axillary nerve PNS         Recent Visits No visits were found meeting these conditions. Showing recent visits within past 90 days and meeting all other requirements Today's Visits Date Type Provider Dept  10/20/20 Office Visit Gillis Santa, MD [facility]  Showing today's visits and meeting all other requirements Future Appointments No visits were found meeting these conditions. Showing future appointments within next 90 days and meeting all other requirements  I discussed the assessment and treatment plan with the patient. The patient was provided an opportunity to ask questions and all were answered. The patient agreed with the plan and demonstrated an understanding of the  instructions.  Patient advised to call back or seek an in-person evaluation if the symptoms or condition worsens.  Duration of encounter:16mnutes.  Note by: BGillis Santa MD Date: 10/20/2020; Time: 9:21 AM

## 2020-10-20 NOTE — Progress Notes (Signed)
Nursing Pain Medication Assessment:  Safety precautions to be maintained throughout the outpatient stay will include: orient to surroundings, keep bed in low position, maintain call bell within reach at all times, provide assistance with transfer out of bed and ambulation.  Medication Inspection Compliance: Pill count conducted under aseptic conditions, in front of the patient. Neither the pills nor the bottle was removed from the patient's sight at any time. Once count was completed pills were immediately returned to the patient in their original bottle.  Medication: Hydrocodone/APAP Pill/Patch Count: 20 of 75 pills remain Pill/Patch Appearance: Markings consistent with prescribed medication Bottle Appearance: Standard pharmacy container. Clearly labeled. Filled Date: 09/28/2020 Last Medication intake:  Today

## 2020-10-24 LAB — TOXASSURE SELECT 13 (MW), URINE

## 2020-10-26 DIAGNOSIS — J9601 Acute respiratory failure with hypoxia: Secondary | ICD-10-CM | POA: Diagnosis not present

## 2020-10-26 DIAGNOSIS — J449 Chronic obstructive pulmonary disease, unspecified: Secondary | ICD-10-CM | POA: Diagnosis not present

## 2020-11-16 DIAGNOSIS — E1142 Type 2 diabetes mellitus with diabetic polyneuropathy: Secondary | ICD-10-CM | POA: Diagnosis not present

## 2020-11-16 DIAGNOSIS — E785 Hyperlipidemia, unspecified: Secondary | ICD-10-CM | POA: Diagnosis not present

## 2020-11-16 DIAGNOSIS — Z79899 Other long term (current) drug therapy: Secondary | ICD-10-CM | POA: Diagnosis not present

## 2020-11-16 DIAGNOSIS — D649 Anemia, unspecified: Secondary | ICD-10-CM | POA: Diagnosis not present

## 2020-11-16 DIAGNOSIS — Z0000 Encounter for general adult medical examination without abnormal findings: Secondary | ICD-10-CM | POA: Diagnosis not present

## 2020-11-16 DIAGNOSIS — Z125 Encounter for screening for malignant neoplasm of prostate: Secondary | ICD-10-CM | POA: Diagnosis not present

## 2020-11-16 DIAGNOSIS — I10 Essential (primary) hypertension: Secondary | ICD-10-CM | POA: Diagnosis not present

## 2020-11-16 DIAGNOSIS — Z136 Encounter for screening for cardiovascular disorders: Secondary | ICD-10-CM | POA: Diagnosis not present

## 2020-11-23 DIAGNOSIS — E1151 Type 2 diabetes mellitus with diabetic peripheral angiopathy without gangrene: Secondary | ICD-10-CM | POA: Diagnosis not present

## 2020-11-23 DIAGNOSIS — M19011 Primary osteoarthritis, right shoulder: Secondary | ICD-10-CM | POA: Diagnosis not present

## 2020-11-23 DIAGNOSIS — Z0000 Encounter for general adult medical examination without abnormal findings: Secondary | ICD-10-CM | POA: Diagnosis not present

## 2020-11-23 DIAGNOSIS — L539 Erythematous condition, unspecified: Secondary | ICD-10-CM | POA: Diagnosis not present

## 2020-11-23 DIAGNOSIS — F339 Major depressive disorder, recurrent, unspecified: Secondary | ICD-10-CM | POA: Diagnosis not present

## 2020-11-23 DIAGNOSIS — J9610 Chronic respiratory failure, unspecified whether with hypoxia or hypercapnia: Secondary | ICD-10-CM | POA: Diagnosis not present

## 2020-11-23 DIAGNOSIS — J84112 Idiopathic pulmonary fibrosis: Secondary | ICD-10-CM | POA: Diagnosis not present

## 2020-11-23 DIAGNOSIS — I5020 Unspecified systolic (congestive) heart failure: Secondary | ICD-10-CM | POA: Diagnosis not present

## 2020-11-23 DIAGNOSIS — J449 Chronic obstructive pulmonary disease, unspecified: Secondary | ICD-10-CM | POA: Diagnosis not present

## 2020-11-25 DIAGNOSIS — J9601 Acute respiratory failure with hypoxia: Secondary | ICD-10-CM | POA: Diagnosis not present

## 2020-11-25 DIAGNOSIS — J449 Chronic obstructive pulmonary disease, unspecified: Secondary | ICD-10-CM | POA: Diagnosis not present

## 2020-11-27 ENCOUNTER — Other Ambulatory Visit (INDEPENDENT_AMBULATORY_CARE_PROVIDER_SITE_OTHER): Payer: Self-pay | Admitting: Nurse Practitioner

## 2020-11-27 DIAGNOSIS — I739 Peripheral vascular disease, unspecified: Secondary | ICD-10-CM

## 2020-11-30 ENCOUNTER — Other Ambulatory Visit: Payer: Self-pay

## 2020-11-30 ENCOUNTER — Ambulatory Visit (INDEPENDENT_AMBULATORY_CARE_PROVIDER_SITE_OTHER): Payer: Medicare HMO

## 2020-11-30 ENCOUNTER — Encounter (INDEPENDENT_AMBULATORY_CARE_PROVIDER_SITE_OTHER): Payer: Self-pay | Admitting: Vascular Surgery

## 2020-11-30 ENCOUNTER — Ambulatory Visit (INDEPENDENT_AMBULATORY_CARE_PROVIDER_SITE_OTHER): Payer: Medicare HMO | Admitting: Vascular Surgery

## 2020-11-30 VITALS — BP 122/84 | HR 98 | Ht 67.0 in | Wt 195.0 lb

## 2020-11-30 DIAGNOSIS — Z794 Long term (current) use of insulin: Secondary | ICD-10-CM | POA: Diagnosis not present

## 2020-11-30 DIAGNOSIS — E785 Hyperlipidemia, unspecified: Secondary | ICD-10-CM

## 2020-11-30 DIAGNOSIS — E1165 Type 2 diabetes mellitus with hyperglycemia: Secondary | ICD-10-CM

## 2020-11-30 DIAGNOSIS — I25810 Atherosclerosis of coronary artery bypass graft(s) without angina pectoris: Secondary | ICD-10-CM | POA: Diagnosis not present

## 2020-11-30 DIAGNOSIS — I6522 Occlusion and stenosis of left carotid artery: Secondary | ICD-10-CM | POA: Diagnosis not present

## 2020-11-30 DIAGNOSIS — J449 Chronic obstructive pulmonary disease, unspecified: Secondary | ICD-10-CM

## 2020-11-30 DIAGNOSIS — I739 Peripheral vascular disease, unspecified: Secondary | ICD-10-CM | POA: Diagnosis not present

## 2020-11-30 NOTE — Progress Notes (Signed)
MRN : [id number]  [name] is a 60 y.o. (05/14/1961) male who presents with chief complaint of  Chief Complaint  Patient presents with  . Follow-up    98 MO U/S  .  History of Present Illness:   The patient returns to the office for followup and review of the noninvasive studies. There have been no interval changes in lower extremity symptoms. No interval shortening of the patient's claudication distance or development of rest pain symptoms. No new ulcers or wounds have occurred since the last visit.  There have been no significant changes to the patient's overall health care.  The patient denies amaurosis fugax or recent TIA symptoms. There are no recent neurological changes noted. The patient denies history of DVT, PE or superficial thrombophlebitis. The patient denies recent episodes of angina or shortness of breath.   ABI Rt=[place], TBI=0.80and Lt=1.12(previous ABI's Rt=[place], TBI=1.16and Lt=1.01)   Current Meds  Medication Sig  . acetaminophen (TYLENOL) 500 MG tablet Take 1 tablet (500 mg total) by mouth every 8 (eight) hours as needed for moderate pain or headache.  . albuterol (PROVENTIL HFA;VENTOLIN HFA) 108 (90 Base) MCG/ACT inhaler Inhale 2 puffs into the lungs every 6 (six) hours as needed for wheezing or shortness of breath.  Marland Kitchen aspirin EC 81 MG tablet Take 81 mg by mouth daily.  Marland Kitchen buPROPion (WELLBUTRIN XL) 150 MG 24 hr tablet Take by mouth.  . carvedilol (COREG) 3.125 MG tablet Take by mouth.  . furosemide (LASIX) 40 MG tablet Take 40-80 mg by mouth See admin instructions. Take 80 mg in the morning and 40 mg in the evening  . HYDROcodone-acetaminophen (NORCO) 10-325 MG tablet Take 1-2 tablets by mouth 2 (two) times daily as needed for severe pain. Must last 30 days.  Derrill Memo ON 12/27/2020] HYDROcodone-acetaminophen (NORCO) 10-325 MG tablet Take 1-2 tablets by mouth 2 (two) times daily as needed for severe pain. Must last 30 days.  . metFORMIN (GLUCOPHAGE) 1000  MG tablet Take 1,000 mg by mouth 2 (two) times daily with a meal.  . metFORMIN (GLUCOPHAGE) 500 MG tablet Take 500 mg by mouth 2 (two) times daily with a meal.  . Pirfenidone (ESBRIET) 267 MG CAPS Take 801 mg by mouth 2 (two) times daily.   . pravastatin (PRAVACHOL) 40 MG tablet Take 40 mg by mouth every other day.   . sodium chloride (OCEAN) 0.65 % nasal spray Place 1 spray into the nose as needed (dryness).  Marland Kitchen spironolactone (ALDACTONE) 25 MG tablet Take 1 tablet by mouth daily.    Past Medical History:  Diagnosis Date  . Cancer ([place])    skin (scalp)  . CHF (congestive heart failure) ([place])   . COPD (chronic obstructive pulmonary disease) ([place])   . Coronary artery disease   . Diabetes mellitus without complication ([place])   . Dyspnea   . Fibromyalgia   . History of kidney stones    asymptomatic  . MI (myocardial infarction) ([place])   . Peripheral vascular disease ([place])   . Pneumonia     Past Surgical History:  Procedure Laterality Date  . APPLICATION OF WOUND VAC     chest wound s/p CABG  . CARDIAC CATHETERIZATION    . CORONARY ARTERY BYPASS GRAFT  2015   3 vessels  . ENDARTERECTOMY Left 09/08/2017   Procedure: ENDARTERECTOMY CAROTID;  Surgeon: Katha Cabal, MD;  Location: [facility];  Service: Vascular;  Laterality: Left;  . LOWER EXTREMITY ANGIOGRAPHY Right 01/29/2019   Procedure:  LOWER EXTREMITY ANGIOGRAPHY;  Surgeon: Katha Cabal, MD;  Location: [place] [facility];  Service: Cardiovascular;  Laterality: Right;  . PERIPHERAL VASCULAR CATHETERIZATION N/A 06/09/2015   Procedure: Abdominal Aortogram w/Lower Extremity;  Surgeon: Katha Cabal, MD;  Location: [place] [facility];  Service: Cardiovascular;  Laterality: N/A;  . PERIPHERAL VASCULAR CATHETERIZATION  06/09/2015   Procedure: Lower Extremity Intervention;  Surgeon: Katha Cabal, MD;  Location: [place] [facility];  Service: Cardiovascular;;  . stent in leg Left   . WOUND DEBRIDEMENT     debridement  of chest wound x 2 s/p CABG     Social History Social History   Tobacco Use  . Smoking status: Former Smoker    Types: Cigarettes    Quit date: 10/20/2013    Years since quitting: 7.1  . Smokeless tobacco: Never Used  Vaping Use  . Vaping Use: Never used  Substance Use Topics  . Alcohol use: Not Currently    Comment: seldom  . Drug use: No    Family History Family History  Problem Relation Age of Onset  . Heart attack Mother   . Cancer Father   . Mental illness Neg Hx     No Known Allergies   REVIEW OF SYSTEMS (Negative unless checked)  Constitutional: [] Weight loss  [] Fever  [] Chills Cardiac: [] Chest pain   [] Chest pressure   [] Palpitations   [] Shortness of breath when laying flat   [] Shortness of breath with exertion. Vascular:  [x] Pain in legs with walking   [x] Pain in legs at rest  [] History of DVT   [] Phlebitis   [] Swelling in legs   [] Varicose veins   [] Non-healing ulcers Pulmonary:   [] Uses home oxygen   [] Productive cough   [] Hemoptysis   [] Wheeze  [] COPD   [] Asthma Neurologic:  [] Dizziness   [] Seizures   [] History of stroke   [] History of TIA  [] Aphasia   [] Vissual changes   [] Weakness or numbness in arm   [x] Weakness or numbness in leg Musculoskeletal:   [] Joint swelling   [x] Joint pain   [] Low back pain Hematologic:  [] Easy bruising  [] Easy bleeding   [] Hypercoagulable state   [] Anemic Gastrointestinal:  [] Diarrhea   [] Vomiting  [] Gastroesophageal reflux/heartburn   [] Difficulty swallowing. Genitourinary:  [] Chronic kidney disease   [] Difficult urination  [] Frequent urination   [] Blood in urine Skin:  [] Rashes   [] Ulcers  Psychological:  [] History of anxiety   []  History of major depression.  Physical Examination  Vitals:   11/30/20 1458  BP: 122/84  Pulse: 98  Weight: 195 lb (88.5 kg)  Height: 5\' 7"  (1.702 m)   Body mass index is 30.54 kg/m. Gen: WD/WN, NAD Head: [place]/AT, No temporalis wasting.  Ear/Nose/Throat: Hearing grossly intact, nares w/o  erythema or drainage Eyes: PER, EOMI, sclera nonicteric.  Neck: Supple, no large masses.   Pulmonary:  Good air movement, no audible wheezing bilaterally, no use of accessory muscles.  Cardiac: RRR, no JVD Vascular:  Vessel Right Left  Radial Palpable Palpable  PT Not Palpable Not Palpable  DP Not Palpable Not Palpable  Gastrointestinal: Non-distended. No guarding/no peritoneal signs.  Musculoskeletal: M/S 5/5 throughout.  No deformity or atrophy.  Neurologic: CN 2-12 intact. Symmetrical.  Speech is fluent. Motor exam as listed above. Psychiatric: Judgment intact, Mood & affect appropriate for pt's clinical situation. Dermatologic: No rashes or ulcers noted.  No changes consistent with cellulitis.   CBC Lab Results  Component Value Date   WBC 7.6 07/21/2018  HGB 11.9 (L) 07/21/2018   HCT 39.0 07/21/2018   MCV 97.0 07/21/2018   PLT 160 07/21/2018    BMET    Component Value Date/Time   NA 137 07/23/2018 0823   NA 134 (L) 10/22/2013 0507   K 4.2 07/23/2018 0823   K 4.5 10/22/2013 0507   CL 102 07/23/2018 0823   CL 101 10/22/2013 0507   CO2 27 07/23/2018 0823   CO2 28 10/22/2013 0507   GLUCOSE 174 (H) 07/23/2018 0823   GLUCOSE 300 (H) 10/22/2013 0507   BUN 21 (H) 01/29/2019 0741   BUN 32 (H) 10/22/2013 0507   CREATININE 1.01 01/29/2019 0741   CREATININE 1.18 10/22/2013 0507   CALCIUM 8.5 (L) 07/23/2018 0823   CALCIUM 8.0 (L) 10/22/2013 0507   GFRNONAA >60 01/29/2019 0741   GFRNONAA >60 10/22/2013 0507   GFRAA >60 01/29/2019 0741   GFRAA >60 10/22/2013 0507   CrCl cannot be calculated (Patient's most recent lab result is older than the maximum 21 days allowed.).  COAG Lab Results  Component Value Date   INR 1.07 09/09/2017   INR 1.04 09/08/2017   INR 0.87 07/31/2017    Radiology No results found.   Assessment/Plan 1. PAD (peripheral artery disease) (HCC) Recommend:  The patient is status post successful angiogram with intervention.  The patient  reports that the claudication symptoms and leg pain is essentially gone.   The patient denies lifestyle limiting changes at this point in time.  No further invasive studies, angiography or surgery at this time The patient should continue walking and begin a more formal exercise program.  The patient should continue antiplatelet therapy and aggressive treatment of the lipid abnormalities  Patient should undergo noninvasive studies as ordered. The patient will follow up with me after the studies.  - VAS [place] ABI WITH/WO TBI; Future  2. Carotid stenosis, symptomatic w/o infarct, left Recommend:  Given the patient's asymptomatic subcritical stenosis no further invasive testing or surgery at this time.  Duplex ultrasound shows <50% stenosis bilaterally.  Continue antiplatelet therapy as prescribed Continue management of CAD, HTN and Hyperlipidemia Healthy heart diet,  encouraged exercise at least 4 times per week Follow up in 12 months with duplex ultrasound and physical exam - VAS US CAROTID; Future  3. Coronary artery disease involving other coronary artery bypass graft, unspecified whether angina present Continue cardiac and antihypertensive medications as already ordered and reviewed, no changes at this time.  Continue statin as ordered and reviewed, no changes at this time  Nitrates PRN for chest pain   4. Chronic obstructive pulmonary disease, unspecified COPD type ([place]) Continue pulmonary medications and aerosols as already ordered, these medications have been reviewed and there are no changes at this time.    5. Type 2 diabetes mellitus with hyperglycemia, with long-term current use of insulin (HCC) Continue hypoglycemic medications as already ordered, these medications have been reviewed and there are no changes at this time.  Hgb A1C to be monitored as already arranged by primary service   6. Hyperlipidemia, unspecified hyperlipidemia type Continue statin as ordered  and reviewed, no changes at this time    Hortencia Pilar, MD  11/30/2020 3:15 PM

## 2020-12-09 DIAGNOSIS — D044 Carcinoma in situ of skin of scalp and neck: Secondary | ICD-10-CM | POA: Diagnosis not present

## 2020-12-09 DIAGNOSIS — D2271 Melanocytic nevi of right lower limb, including hip: Secondary | ICD-10-CM | POA: Diagnosis not present

## 2020-12-09 DIAGNOSIS — D2261 Melanocytic nevi of right upper limb, including shoulder: Secondary | ICD-10-CM | POA: Diagnosis not present

## 2020-12-09 DIAGNOSIS — L309 Dermatitis, unspecified: Secondary | ICD-10-CM | POA: Diagnosis not present

## 2020-12-09 DIAGNOSIS — Z85828 Personal history of other malignant neoplasm of skin: Secondary | ICD-10-CM | POA: Diagnosis not present

## 2020-12-09 DIAGNOSIS — L570 Actinic keratosis: Secondary | ICD-10-CM | POA: Diagnosis not present

## 2020-12-09 DIAGNOSIS — D485 Neoplasm of uncertain behavior of skin: Secondary | ICD-10-CM | POA: Diagnosis not present

## 2020-12-09 DIAGNOSIS — D2262 Melanocytic nevi of left upper limb, including shoulder: Secondary | ICD-10-CM | POA: Diagnosis not present

## 2020-12-09 DIAGNOSIS — D2272 Melanocytic nevi of left lower limb, including hip: Secondary | ICD-10-CM | POA: Diagnosis not present

## 2020-12-09 DIAGNOSIS — L98429 Non-pressure chronic ulcer of back with unspecified severity: Secondary | ICD-10-CM | POA: Diagnosis not present

## 2020-12-23 DIAGNOSIS — D044 Carcinoma in situ of skin of scalp and neck: Secondary | ICD-10-CM | POA: Diagnosis not present

## 2020-12-26 ENCOUNTER — Emergency Department: Payer: Medicare HMO

## 2020-12-26 ENCOUNTER — Emergency Department
Admission: EM | Admit: 2020-12-26 | Discharge: 2020-12-26 | Disposition: A | Discharge status: Home / Self Care | Payer: Medicare HMO | Attending: Emergency Medicine | Admitting: Emergency Medicine

## 2020-12-26 ENCOUNTER — Encounter: Payer: Self-pay | Admitting: Intensive Care

## 2020-12-26 ENCOUNTER — Other Ambulatory Visit: Payer: Self-pay

## 2020-12-26 DIAGNOSIS — Z951 Presence of aortocoronary bypass graft: Secondary | ICD-10-CM | POA: Diagnosis not present

## 2020-12-26 DIAGNOSIS — E119 Type 2 diabetes mellitus without complications: Secondary | ICD-10-CM | POA: Diagnosis not present

## 2020-12-26 DIAGNOSIS — E860 Dehydration: Secondary | ICD-10-CM | POA: Insufficient documentation

## 2020-12-26 DIAGNOSIS — Z85828 Personal history of other malignant neoplasm of skin: Secondary | ICD-10-CM | POA: Insufficient documentation

## 2020-12-26 DIAGNOSIS — Z7982 Long term (current) use of aspirin: Secondary | ICD-10-CM | POA: Insufficient documentation

## 2020-12-26 DIAGNOSIS — Z79899 Other long term (current) drug therapy: Secondary | ICD-10-CM | POA: Diagnosis not present

## 2020-12-26 DIAGNOSIS — Z87891 Personal history of nicotine dependence: Secondary | ICD-10-CM | POA: Diagnosis not present

## 2020-12-26 DIAGNOSIS — I509 Heart failure, unspecified: Secondary | ICD-10-CM | POA: Diagnosis not present

## 2020-12-26 DIAGNOSIS — E1140 Type 2 diabetes mellitus with diabetic neuropathy, unspecified: Secondary | ICD-10-CM | POA: Insufficient documentation

## 2020-12-26 DIAGNOSIS — I2510 Atherosclerotic heart disease of native coronary artery without angina pectoris: Secondary | ICD-10-CM | POA: Diagnosis not present

## 2020-12-26 DIAGNOSIS — Z20822 Contact with and (suspected) exposure to covid-19: Secondary | ICD-10-CM | POA: Insufficient documentation

## 2020-12-26 DIAGNOSIS — J449 Chronic obstructive pulmonary disease, unspecified: Secondary | ICD-10-CM | POA: Diagnosis not present

## 2020-12-26 DIAGNOSIS — Z7984 Long term (current) use of oral hypoglycemic drugs: Secondary | ICD-10-CM | POA: Insufficient documentation

## 2020-12-26 DIAGNOSIS — R531 Weakness: Secondary | ICD-10-CM | POA: Diagnosis not present

## 2020-12-26 DIAGNOSIS — R42 Dizziness and giddiness: Secondary | ICD-10-CM | POA: Diagnosis not present

## 2020-12-26 DIAGNOSIS — R519 Headache, unspecified: Secondary | ICD-10-CM | POA: Diagnosis not present

## 2020-12-26 DIAGNOSIS — J9601 Acute respiratory failure with hypoxia: Secondary | ICD-10-CM | POA: Diagnosis not present

## 2020-12-26 DIAGNOSIS — I959 Hypotension, unspecified: Secondary | ICD-10-CM | POA: Diagnosis not present

## 2020-12-26 HISTORY — DX: Idiopathic pulmonary fibrosis: J84.112

## 2020-12-26 LAB — BASIC METABOLIC PANEL
Anion gap: 12 (ref 5–15)
BUN: 42 mg/dL — ABNORMAL HIGH (ref 8–23)
CO2: 21 mmol/L — ABNORMAL LOW (ref 22–32)
Calcium: 9.3 mg/dL (ref 8.9–10.3)
Chloride: 100 mmol/L (ref 98–111)
Creatinine, Ser: 1.7 mg/dL — ABNORMAL HIGH (ref 0.61–1.24)
GFR, Estimated: 45 mL/min — ABNORMAL LOW (ref >60)
Glucose, Bld: 170 mg/dL — ABNORMAL HIGH (ref 70–99)
Potassium: 4.7 mmol/L (ref 3.5–5.1)
Sodium: 133 mmol/L — ABNORMAL LOW (ref 135–145)

## 2020-12-26 LAB — CBC
HCT: 43.4 % (ref 39.0–52.0)
Hemoglobin: 15 g/dL (ref 13.0–17.0)
MCH: 31.9 pg (ref 26.0–34.0)
MCHC: 34.6 g/dL (ref 30.0–36.0)
MCV: 92.3 fL (ref 80.0–100.0)
Platelets: 163 10*3/uL (ref 150–400)
RBC: 4.7 MIL/uL (ref 4.22–5.81)
RDW: 12.3 % (ref 11.5–15.5)
WBC: 9.9 10*3/uL (ref 4.0–10.5)
nRBC: 0 % (ref 0.0–0.2)

## 2020-12-26 LAB — URINALYSIS, COMPLETE (UACMP) WITH MICROSCOPIC
Bacteria, UA: NONE SEEN
Bilirubin Urine: NEGATIVE
Glucose, UA: NEGATIVE mg/dL
Hgb urine dipstick: NEGATIVE
Ketones, ur: NEGATIVE mg/dL
Leukocytes,Ua: NEGATIVE
Nitrite: NEGATIVE
Protein, ur: 100 mg/dL — AB
Specific Gravity, Urine: 1.025 (ref 1.005–1.030)
pH: 5 (ref 5.0–8.0)

## 2020-12-26 LAB — RESP PANEL BY RT-PCR (FLU A&B, COVID) ARPGX2
Influenza A by PCR: NEGATIVE
Influenza B by PCR: NEGATIVE
SARS Coronavirus 2 by RT PCR: NEGATIVE

## 2020-12-26 LAB — BRAIN NATRIURETIC PEPTIDE: B Natriuretic Peptide: 237.9 pg/mL — ABNORMAL HIGH (ref 0.0–100.0)

## 2020-12-26 MED ORDER — SODIUM CHLORIDE 0.9 % IV BOLUS
500.0000 mL | Freq: Once | INTRAVENOUS | Status: AC
Start: 2020-12-26 — End: 2020-12-26
  Administered 2020-12-26: 500 mL via INTRAVENOUS

## 2020-12-26 MED ORDER — MECLIZINE HCL 25 MG PO TABS: 25.0000 mg | ORAL_TABLET | Freq: Three times a day (TID) | ORAL | 1 refills | Status: DC | PRN

## 2020-12-26 MED ORDER — MECLIZINE HCL 25 MG PO TABS
50.0000 mg | ORAL_TABLET | Freq: Once | ORAL | Status: AC
  Administered 2020-12-26: 50 mg via ORAL
  Filled 2020-12-26: qty 2

## 2020-12-26 NOTE — ED Triage Notes (Signed)
Pt c/o low pressure with weakness,headache, dizziness and fatigue X2 weeks. Reports his medication was adjusted about a month ago

## 2020-12-26 NOTE — Discharge Instructions (Signed)
Your evaluation today shows that you were a bit dehydrated.  Your blood pressure went back to normal after receiving some IV fluids.  The rest of your evaluation was reassuring.  Continue taking your medications as usual.  With the arrival of summer weather increasing your daily fluid needs, it is important to check your weight every day in the morning to track your fluid balance.  Please follow-up with your primary care doctor or the heart failure clinic this week.

## 2020-12-26 NOTE — ED Provider Notes (Signed)
[facility] Emergency Department Provider Note  ____________________________________________  Time seen: Approximately 6:20 PM  I have reviewed the triage vital signs and the nursing notes.   HISTORY  Chief Complaint Hypotension    HPI [name] is a 60 y.o. male with a history of CHF COPD CAD diabetes who comes ED complaining of generalized weakness dizziness and fatigue for the past 2 weeks.  Also has moderate headache which is not thunderclap or abrupt onset.  Reports compliance with his medications.  Denies chest pain or shortness of breath.  No falls or trauma.  Symptoms are also associated with intermittent vertiginous symptoms which are new for him.  Symptoms are worse with standing, better with sitting and lying down.  No exertional pain or shortness of breath.  No significant leg swelling      Past Medical History:  Diagnosis Date  . Cancer ([place])    skin (scalp)  . CHF (congestive heart failure) ([place])   . COPD (chronic obstructive pulmonary disease) ([place])   . Coronary artery disease   . Coronary artery disease   . Diabetes mellitus without complication ([place])   . Dyspnea   . Fibromyalgia   . History of kidney stones    asymptomatic  . IPF (idiopathic pulmonary fibrosis) ([place])   . MI (myocardial infarction) ([place])   . Peripheral vascular disease ([place])   . Pneumonia      Patient Active Problem List   Diagnosis Date Noted  . Primary osteoarthritis of right shoulder 01/23/2020  . Right leg swelling 03/10/2019  . Evaluation by psychiatric service required 01/28/2019  . Chronic pain syndrome 01/28/2019  . Insomnia due to medical condition 01/28/2019  . Type 2 diabetes mellitus with hyperglycemia, with long-term current use of insulin ([place]) 11/23/2018  . Acute respiratory failure with hypoxemia ([place]) 07/20/2018  . Chronic right shoulder pain 03/19/2018  . Carotid stenosis, symptomatic w/o infarct, left 09/08/2017  . Carotid stenosis  07/10/2017  . PAD (peripheral artery disease) ([place]) 08/05/2016  . Diabetes mellitus ([place]) 08/05/2016  . Hyperlipidemia 08/05/2016  . Type 2 diabetes mellitus with peripheral neuropathy ([place]) 06/30/2015  . 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 Obesity 01/31/2014  . Congestive heart failure ([place]) 12/14/2013  . COPD (chronic obstructive pulmonary disease) ([place]) 12/14/2013  . S/P CABG (coronary artery bypass graft) 12/10/2013  . CAD (coronary artery disease) 11/22/2013  . DM (diabetes mellitus) ([place]) 03/19/2012     Past Surgical History:  Procedure Laterality Date  . APPLICATION OF WOUND VAC     chest wound s/p CABG  . CARDIAC CATHETERIZATION    . CORONARY ARTERY BYPASS GRAFT  2015   3 vessels  . ENDARTERECTOMY Left 09/08/2017   Procedure: ENDARTERECTOMY CAROTID;  Surgeon: Katha Cabal, MD;  Location: [facility] ORS;  Service: Vascular;  Laterality: Left;  . LOWER EXTREMITY ANGIOGRAPHY Right 01/29/2019   Procedure: LOWER EXTREMITY ANGIOGRAPHY;  Surgeon: Katha Cabal, MD;  Location: [place] [facility];  Service: Cardiovascular;  Laterality: Right;  . PERIPHERAL VASCULAR CATHETERIZATION N/A 06/09/2015   Procedure: Abdominal Aortogram w/Lower Extremity;  Surgeon: Katha Cabal, MD;  Location: [place] [facility];  Service: Cardiovascular;  Laterality: N/A;  . PERIPHERAL VASCULAR CATHETERIZATION  06/09/2015   Procedure: Lower Extremity Intervention;  Surgeon: Katha Cabal, MD;  Location: [place] [facility];  Service: Cardiovascular;;  . stent in leg Left   . WOUND DEBRIDEMENT     debridement of chest  wound x 2 s/p CABG      Prior to Admission medications   Medication Sig Start Date End Date Taking? Authorizing Provider  acetaminophen (TYLENOL) 500 MG tablet Take 1 tablet (500 mg total) by mouth every 8 (eight) hours as needed for moderate pain or headache. 07/14/20   Gillis Santa, MD  albuterol (PROVENTIL HFA;VENTOLIN HFA)  108 (90 Base) MCG/ACT inhaler Inhale 2 puffs into the lungs every 6 (six) hours as needed for wheezing or shortness of breath.    [provider]  aspirin EC 81 MG tablet Take 81 mg by mouth daily.    [provider]  buPROPion (WELLBUTRIN XL) 150 MG 24 hr tablet Take by mouth. 11/23/20 11/23/21  [provider]  carvedilol (COREG) 3.125 MG tablet Take by mouth. 01/21/20 01/20/21  [provider]  furosemide (LASIX) 40 MG tablet Take 40-80 mg by mouth See admin instructions. Take 80 mg in the morning and 40 mg in the evening    [provider]  HYDROcodone-acetaminophen (NORCO) 10-325 MG tablet Take 1-2 tablets by mouth 2 (two) times daily as needed for severe pain. Must last 30 days. 10/28/20 11/27/20  Gillis Santa, MD  HYDROcodone-acetaminophen (NORCO) 10-325 MG tablet Take 1-2 tablets by mouth 2 (two) times daily as needed for severe pain. Must last 30 days. 11/27/20 12/27/20  Gillis Santa, MD  HYDROcodone-acetaminophen (NORCO) 10-325 MG tablet Take 1-2 tablets by mouth 2 (two) times daily as needed for severe pain. Must last 30 days. 12/27/20 01/26/21  Gillis Santa, MD  metFORMIN (GLUCOPHAGE) 1000 MG tablet Take 1,000 mg by mouth 2 (two) times daily with a meal.    [provider]  metFORMIN (GLUCOPHAGE) 500 MG tablet Take 500 mg by mouth 2 (two) times daily with a meal. 06/19/20   [provider]  Pirfenidone (ESBRIET) 267 MG CAPS Take 801 mg by mouth 2 (two) times daily.  03/28/18   [provider]  pravastatin (PRAVACHOL) 40 MG tablet Take 40 mg by mouth every other day.     [provider]  sodium chloride (OCEAN) 0.65 % nasal spray Place 1 spray into the nose as needed (dryness).    [provider]  spironolactone (ALDACTONE) 25 MG tablet Take 1 tablet by mouth daily. 08/31/20   [provider]  tamsulosin (FLOMAX) 0.4 MG CAPS capsule Take 0.4 mg by mouth every evening.  Patient not taking: Reported on 11/30/2020     [provider]     Allergies Patient has no known allergies.   Family History  Problem Relation Age of Onset  . Heart attack Mother   . Cancer Father   . Mental illness Neg Hx     Social History Social History   Tobacco Use  . Smoking status: Former Smoker    Types: Cigarettes    Quit date: 10/20/2013    Years since quitting: 7.1  . Smokeless tobacco: Former Network engineer  . Vaping Use: Never used  Substance Use Topics  . Alcohol use: Yes    Comment: seldom  . Drug use: No    Review of Systems  Constitutional:   No fever or chills.  Positive fatigue ENT:   No sore throat. No rhinorrhea. Cardiovascular:   No chest pain or syncope. Respiratory:   No dyspnea or cough. Gastrointestinal:   Negative for abdominal pain, vomiting and diarrhea.  Musculoskeletal:   Negative for focal pain or swelling All other systems reviewed and are negative except as  documented above in ROS and HPI.  ____________________________________________   PHYSICAL EXAM:  VITAL SIGNS: ED Triage Vitals  Enc Vitals Group     BP 12/26/20 1538 103/63     Pulse Rate 12/26/20 1538 89     Resp 12/26/20 1538 20     Temp 12/26/20 1538 97.8 F (36.6 C)     Temp Source 12/26/20 1538 Oral     SpO2 12/26/20 1538 95 %     Weight 12/26/20 1541 188 lb (85.3 kg)     Height 12/26/20 1541 5\' 7"  (1.702 m)     Head Circumference --      Peak Flow --      Pain Score 12/26/20 1540 8     Pain Loc --      Pain Edu? --      Excl. in [place]? --     Vital signs reviewed, nursing assessments reviewed.   Constitutional:   Alert and oriented. Non-toxic appearance.  Sitting upright, calm Eyes:   Conjunctivae are normal. EOMI. PERRL. ENT      Head:   Normocephalic and atraumatic.      Nose:   Normal.      Mouth/Throat:   Normal      Neck:   No meningismus. Full ROM. Hematological/Lymphatic/Immunilogical:   No cervical lymphadenopathy. Cardiovascular:   RRR. Symmetric bilateral radial and DP  pulses.  No murmurs. Cap refill less than 2 seconds. Respiratory:   Normal respiratory effort without tachypnea/retractions. Breath sounds are clear and equal bilaterally. No wheezes/rales/rhonchi. Gastrointestinal:   Soft and nontender. Non distended. There is no CVA tenderness.  No rebound, rigidity, or guarding. Genitourinary:   deferred Musculoskeletal:   Normal range of motion in all extremities. No joint effusions.  No lower extremity tenderness.  No edema. Neurologic:   Normal speech and language.  Motor grossly intact. No acute focal neurologic deficits are appreciated.  Skin:    Skin is warm, dry and intact. No rash noted.  No petechiae, purpura, or bullae.  ____________________________________________    LABS (pertinent positives/negatives) (all labs ordered are listed, but only abnormal results are displayed) Labs Reviewed  BASIC METABOLIC PANEL - Abnormal; Notable for the following components:      Result Value   Sodium 133 (*)    CO2 21 (*)    Glucose, Bld 170 (*)    BUN 42 (*)    Creatinine, Ser 1.70 (*)    GFR, Estimated 45 (*)    All other components within normal limits  URINALYSIS, COMPLETE (UACMP) WITH MICROSCOPIC - Abnormal; Notable for the following components:   Color, Urine YELLOW (*)    APPearance HAZY (*)    Protein, ur 100 (*)    All other components within normal limits  BRAIN NATRIURETIC PEPTIDE - Abnormal; Notable for the following components:   B Natriuretic Peptide 237.9 (*)    All other components within normal limits  RESP PANEL BY RT-PCR (FLU A&B, COVID) ARPGX2  CBC   ____________________________________________   EKG  Interpreted by me Sinus tachycardia rate 103.  Normal axis and intervals.  Normal QRS ST segments and T waves.  No acute ischemic changes.  ____________________________________________    RADIOLOGY  DG Chest 2 View  Result Date: 12/26/2020 CLINICAL DATA:  Weakness, hypotension, CHF, dizziness, headaches, symptoms for  2 weeks EXAM: CHEST - 2 VIEW COMPARISON:  07/20/2018 FINDINGS: Normal heart size post CABG. Mediastinal contours and pulmonary vascularity normal. Lungs clear. No pulmonary infiltrate, pleural effusion, or pneumothorax.  Scattered degenerative disc disease changes thoracic spine. IMPRESSION: No acute abnormalities. Electronically Signed   By: Lavonia Dana M.D.   On: 12/26/2020 16:31   CT Head Wo Contrast  Result Date: 12/26/2020 CLINICAL DATA:  Low blood pressure, weakness, headache, fatigue, and dizziness for 2 weeks, had medications adjusted a month ago EXAM: CT HEAD WITHOUT CONTRAST TECHNIQUE: Contiguous axial images were obtained from the base of the skull through the vertex without intravenous contrast. Sagittal and coronal MPR images reconstructed from axial data set. COMPARISON:  None FINDINGS: Brain: Mild age-related atrophy. Normal ventricular morphology. No midline shift or mass effect. Otherwise normal appearance of brain parenchyma. No intracranial hemorrhage, mass lesion, or acute infarction. No extra-axial fluid collections. Vascular: Atherosclerotic calcification of internal carotid arteries at skull base Skull: Calvaria intact. Small lucencies are seen within the RIGHT sphenoid bone at the floor of the RIGHT middle cranial fossa anteriorly, appear benign, question arachnoid granulations Sinuses/Orbits: Clear Other: N/A IMPRESSION: No acute intracranial abnormalities. Electronically Signed   By: Lavonia Dana M.D.   On: 12/26/2020 17:24    ____________________________________________   PROCEDURES Procedures  ____________________________________________  DIFFERENTIAL DIAGNOSIS   Dehydration, electrolyte abnormality, vertigo, occult CVA, intracranial hemorrhage, viral illness  CLINICAL IMPRESSION / ASSESSMENT AND PLAN / ED COURSE  Medications ordered in the ED: Medications  sodium chloride 0.9 % bolus 500 mL (500 mLs Intravenous New Bag/Given 12/26/20 1736)  meclizine (ANTIVERT) tablet  50 mg (50 mg Oral Given 12/26/20 1736)    Pertinent labs & imaging results that were available during my care of the patient were reviewed by me and considered in my medical decision making (see chart for details).  [name] was evaluated in Emergency Department on 12/26/2020 for the symptoms described in the history of present illness. He was evaluated in the context of the global COVID-19 pandemic, which necessitated consideration that the patient might be at risk for infection with the SARS-CoV-2 virus that causes COVID-19. Institutional protocols and algorithms that pertain to the evaluation of patients at risk for COVID-19 are in a state of rapid change based on information released by regulatory bodies including the CDC and federal and state organizations. These policies and algorithms were followed during the patient's care in the ED.   Patient presents with orthostatic symptoms and constitutional symptoms, most likely dehydration in the setting of diuretic regimen with Lasix and metolazone, hot weather.  Symptoms and exam not consistent with CHF exacerbation.  Doubt ACS PE dissection or pneumonia.  CT head negative for mass or intracranial hemorrhage.  Chest x-ray unremarkable.  Lab panel shows BUN and creatinine roughly doubled from his baseline of 20/0.9 and seen in care everywhere/EMR.  Orthostatics also confirm a decrease in blood pressure with change in position.  Will give 500 mL IV fluid bolus, meclizine and reassess.  If feeling better and orthostatics improved, can be discharged home to continue monitoring weights and follow-up with CHF clinic this week.  Clinical Course as of 12/26/20 2002  Sat Dec 26, 2020  2001 Patient feels much better.  Ambulatory with steady gait.  Dizziness resolved.  Orthostatics normalized.  Back to baseline and stable for discharge, follow-up this week with PCP or heart failure clinic.  Counseled on tracking daily weight. [PS]    Clinical Course User  Index [PS] Carrie Mew, MD     ____________________________________________   FINAL CLINICAL IMPRESSION(S) / ED DIAGNOSES    Final diagnoses:  Dizziness  Dehydration  Chronic congestive heart failure, unspecified heart failure  type [facility])     ED Discharge Orders    None      Portions of this note were generated with dragon dictation software. Dictation errors may occur despite best attempts at proofreading.   Carrie Mew, MD 12/26/20 [phone]

## 2020-12-28 ENCOUNTER — Telehealth: Payer: Self-pay | Admitting: Family

## 2020-12-28 NOTE — Telephone Encounter (Signed)
Called patient in attempt to schedule a new patient appointment after receiving referral from the ED at patients recent visit. Patient does not want to be seen by [place] and just wants to follow up with Dr. Kyra Searles.   Deitra Craine, NT

## 2020-12-29 DIAGNOSIS — I9789 Other postprocedural complications and disorders of the circulatory system, not elsewhere classified: Secondary | ICD-10-CM | POA: Diagnosis not present

## 2020-12-29 DIAGNOSIS — I5020 Unspecified systolic (congestive) heart failure: Secondary | ICD-10-CM | POA: Diagnosis not present

## 2020-12-29 DIAGNOSIS — I4891 Unspecified atrial fibrillation: Secondary | ICD-10-CM | POA: Diagnosis not present

## 2020-12-29 DIAGNOSIS — I739 Peripheral vascular disease, unspecified: Secondary | ICD-10-CM | POA: Diagnosis not present

## 2020-12-29 DIAGNOSIS — I2510 Atherosclerotic heart disease of native coronary artery without angina pectoris: Secondary | ICD-10-CM | POA: Diagnosis not present

## 2020-12-29 DIAGNOSIS — I429 Cardiomyopathy, unspecified: Secondary | ICD-10-CM | POA: Diagnosis not present

## 2020-12-29 DIAGNOSIS — I214 Non-ST elevation (NSTEMI) myocardial infarction: Secondary | ICD-10-CM | POA: Diagnosis not present

## 2020-12-29 DIAGNOSIS — Z951 Presence of aortocoronary bypass graft: Secondary | ICD-10-CM | POA: Diagnosis not present

## 2021-01-11 DIAGNOSIS — R42 Dizziness and giddiness: Secondary | ICD-10-CM | POA: Diagnosis not present

## 2021-01-11 DIAGNOSIS — R300 Dysuria: Secondary | ICD-10-CM | POA: Diagnosis not present

## 2021-01-11 DIAGNOSIS — R410 Disorientation, unspecified: Secondary | ICD-10-CM | POA: Diagnosis not present

## 2021-01-13 DIAGNOSIS — I2510 Atherosclerotic heart disease of native coronary artery without angina pectoris: Secondary | ICD-10-CM | POA: Diagnosis not present

## 2021-01-13 DIAGNOSIS — Z951 Presence of aortocoronary bypass graft: Secondary | ICD-10-CM | POA: Diagnosis not present

## 2021-01-13 DIAGNOSIS — I214 Non-ST elevation (NSTEMI) myocardial infarction: Secondary | ICD-10-CM | POA: Diagnosis not present

## 2021-01-13 DIAGNOSIS — I5020 Unspecified systolic (congestive) heart failure: Secondary | ICD-10-CM | POA: Diagnosis not present

## 2021-01-13 DIAGNOSIS — I739 Peripheral vascular disease, unspecified: Secondary | ICD-10-CM | POA: Diagnosis not present

## 2021-01-13 DIAGNOSIS — I429 Cardiomyopathy, unspecified: Secondary | ICD-10-CM | POA: Diagnosis not present

## 2021-01-19 ENCOUNTER — Ambulatory Visit
Payer: Medicare HMO | Attending: Student in an Organized Health Care Education/Training Program | Admitting: Student in an Organized Health Care Education/Training Program

## 2021-01-19 ENCOUNTER — Other Ambulatory Visit: Payer: Self-pay

## 2021-01-19 ENCOUNTER — Encounter: Payer: Self-pay | Admitting: Student in an Organized Health Care Education/Training Program

## 2021-01-19 VITALS — BP 115/76 | HR 93 | Temp 96.9°F | Resp 16 | Ht 67.0 in | Wt 199.0 lb

## 2021-01-19 DIAGNOSIS — I739 Peripheral vascular disease, unspecified: Secondary | ICD-10-CM | POA: Diagnosis not present

## 2021-01-19 DIAGNOSIS — I509 Heart failure, unspecified: Secondary | ICD-10-CM | POA: Diagnosis not present

## 2021-01-19 DIAGNOSIS — M25511 Pain in right shoulder: Secondary | ICD-10-CM | POA: Insufficient documentation

## 2021-01-19 DIAGNOSIS — G8929 Other chronic pain: Secondary | ICD-10-CM

## 2021-01-19 DIAGNOSIS — M19011 Primary osteoarthritis, right shoulder: Secondary | ICD-10-CM | POA: Diagnosis not present

## 2021-01-19 DIAGNOSIS — G894 Chronic pain syndrome: Secondary | ICD-10-CM | POA: Diagnosis not present

## 2021-01-19 DIAGNOSIS — M25512 Pain in left shoulder: Secondary | ICD-10-CM | POA: Insufficient documentation

## 2021-01-19 MED ORDER — HYDROCODONE-ACETAMINOPHEN 10-325 MG PO TABS: 1.0000 | ORAL_TABLET | Freq: Three times a day (TID) | ORAL | 0 refills | Status: DC | PRN

## 2021-01-19 NOTE — Progress Notes (Signed)
Nursing Pain Medication Assessment:  Safety precautions to be maintained throughout the outpatient stay will include: orient to surroundings, keep bed in low position, maintain call bell within reach at all times, provide assistance with transfer out of bed and ambulation.  Medication Inspection Compliance: Pill count conducted under aseptic conditions, in front of the patient. Neither the pills nor the bottle was removed from the patient's sight at any time. Once count was completed pills were immediately returned to the patient in their original bottle.  Medication: Hydrocodone/APAP Pill/Patch Count:  17.5 of 75 pills remain Pill/Patch Appearance: Markings consistent with prescribed medication Bottle Appearance: Standard pharmacy container. Clearly labeled. Filled Date: 06 / 05 / 2022 Last Medication intake:  Today

## 2021-01-19 NOTE — Progress Notes (Signed)
PROVIDER NOTE: Information contained herein reflects review and annotations entered in association with encounter. Interpretation of such information and data should be left to medically-trained personnel. Information provided to patient can be located elsewhere in the medical record under "Patient Instructions". Document created using STT-dictation technology, any transcriptional errors that may result from process are unintentional.    Patient: [name]  Service Category: E/M  Provider: Gillis Santa, MD  DOB: [birth year]  DOS: 01/19/2021  Specialty: Interventional Pain Management  MRN: [id number]  Setting: Ambulatory outpatient  PCP: Tracie Harrier, MD  Type: Established Patient    Referring Provider: Tracie Harrier, MD  Location: Office  Delivery: Face-to-face     HPI  [name], a 60 y.o. year old male, is here today because of his Chronic right shoulder pain [M25.511, G89.29]. [name] primary complain today is Shoulder Pain (right) and Joint Pain Last encounter: My last encounter with him was on 10/20/20 Pertinent problems: [name] has PAD (peripheral artery disease) ([place]); Carotid stenosis; Carotid stenosis, symptomatic w/o infarct, left; CAD (coronary artery disease); Chronic right shoulder pain; S/P CABG (coronary artery bypass graft); DM (diabetes mellitus) ([place]); and Chronic pain syndrome on their pertinent problem list. Pain Assessment: Severity of Chronic pain is reported as a 6 /10. Location: Shoulder (and joint pain; pt states new pains come and go daily in different locations) Right/ . Onset: More than a month ago. Quality: Aching, Burning, Sharp, Shooting. Timing: Constant. Modifying factor(s): meds, heat. Vitals:  height is _0  (1.702 m) and weight is 199 lb (90.3 kg). His temporal temperature is 96.9 F (36.1 C) (abnormal). His blood pressure is 115/76 and his pulse is 93. His respiration is 16 and oxygen saturation is 100%.   Reason for encounter:  medication management.    Follows up today for medication treatment.  Is having increased right shoulder pain management overall joint pain.  He states that he was stung by bees in his right hand yesterday when he was going to total a car.  His right hand is very swollen and erythematous.  I encouraged him to apply ice to reduce swelling take Benadryl.  Patient is requesting an increase in his hydrocodone from quantity 75/month to quantity 90.  Patient states he takes 3 tablets a day and his current prescription only allows for 2.5 tablets a day which is resulting in increased pain at the end of the month when he has to reduce his intake.  [name] has been compliant with therapy.  UDS up-to-date and appropriate.  Pharmacotherapy Assessment   Analgesic: Norco 10 mg 3 times daily as needed, quantity 75/month MME equals 25   Monitoring:  PMP: PDMP not reviewed this encounter.       Pharmacotherapy: No side-effects or adverse reactions reported. Compliance: No problems identified. Effectiveness: Clinically acceptable.  [name], RN  01/19/2021  8:31 AM  Sign when Signing Visit Nursing Pain Medication Assessment:  Safety precautions to be maintained throughout the outpatient stay will include: orient to surroundings, keep bed in low position, maintain call bell within reach at all times, provide assistance with transfer out of bed and ambulation.  Medication Inspection Compliance: Pill count conducted under aseptic conditions, in front of the patient. Neither the pills nor the bottle was removed from the patient's sight at any time. Once count was completed pills were immediately returned to the patient in their original bottle.  Medication: Hydrocodone/APAP Pill/Patch Count:  17.5 of 75 pills remain Pill/Patch Appearance: Markings consistent  with prescribed medication Bottle Appearance: Standard pharmacy container. Clearly labeled. Filled Date: 06 / 05 / 2022 Last Medication intake:   Today   UDS:  Summary  Date Value Ref Range Status  10/20/2020 Note  Final    Comment:    ==================================================================== [employer] [facility] (MW) ==================================================================== Test                             Result       Flag       Units  Drug Present and Declared for Prescription Verification   Hydrocodone                    892          EXPECTED   ng/mg creat   Hydromorphone                  464          EXPECTED   ng/mg creat   Dihydrocodeine                 139          EXPECTED   ng/mg creat   Norhydrocodone                 1136         EXPECTED   ng/mg creat    Sources of hydrocodone include scheduled prescription medications.    Hydromorphone, dihydrocodeine and norhydrocodone are expected    metabolites of hydrocodone. Hydromorphone and dihydrocodeine are    also available as scheduled prescription medications.  ==================================================================== Test                      Result    Flag   Units      Ref Range   Creatinine              36               mg/dL      >=20 ==================================================================== Declared Medications:  The flagging and interpretation on this report are based on the  following declared medications.  Unexpected results may arise from  inaccuracies in the declared medications.   **Note: The testing scope of this panel includes these medications:   Hydrocodone (Norco)   **Note: The testing scope of this panel does not include the  following reported medications:   Acetaminophen (Tylenol)  Acetaminophen (Norco)  Albuterol (Ventolin HFA)  Aspirin  Carvedilol (Coreg)  Furosemide (Lasix)  Metformin (Glucophage)  Pirfenidone (Esbriet)  Pravastatin (Pravachol)  Sodium Chloride  Spironolactone (Aldactone)  Tamsulosin (Flomax) ==================================================================== For  clinical consultation, please call [phone]. ====================================================================      ROS  Constitutional: Denies any fever or chills Gastrointestinal: No reported hemesis, hematochezia, vomiting, or acute GI distress Musculoskeletal:  + right shouder pain Neurological: No reported episodes of acute onset apraxia, aphasia, dysarthria, agnosia, amnesia, paralysis, loss of coordination, or loss of consciousness  Medication Review  HYDROcodone-acetaminophen, Pirfenidone, acetaminophen, albuterol, aspirin EC, buPROPion, carvedilol, furosemide, meclizine, metFORMIN, pravastatin, sodium chloride, spironolactone, and tamsulosin  History Review  Allergy: Mr. Regala has No Known Allergies. Drug: Mr. Caravello  reports no history of drug use. Alcohol:  reports current alcohol use. Tobacco:  reports that he quit smoking about 7 years ago. His smoking use included cigarettes. He has quit using smokeless tobacco. Social: Mr. Sponaugle  reports that he quit smoking about 7 years  ago. His smoking use included cigarettes. He has quit using smokeless tobacco. He reports current alcohol use. He reports that he does not use drugs. Medical:  has a past medical history of Cancer ([place]), CHF (congestive heart failure) ([place]), COPD (chronic obstructive pulmonary disease) ([place]), Coronary artery disease, Coronary artery disease, Diabetes mellitus without complication ([place]), Dyspnea, Fibromyalgia, History of kidney stones, IPF (idiopathic pulmonary fibrosis) ([place]), MI (myocardial infarction) ([place]), Peripheral vascular disease ([place]), and Pneumonia. Surgical: Mr. Ground  has a past surgical history that includes Cardiac catheterization (N/A, 06/09/2015); Cardiac catheterization (06/09/2015); Coronary artery bypass graft (2015); Cardiac catheterization; Application if wound vac; Wound debridement; stent in leg (Left); Endarterectomy (Left, 09/08/2017); and Lower Extremity Angiography (Right,  01/29/2019). Family: family history includes Cancer in his father; Heart attack in his mother.  Laboratory Chemistry Profile   Renal Lab Results  Component Value Date   BUN 42 (H) 12/26/2020   CREATININE 1.70 (H) 12/26/2020   GFRAA >60 01/29/2019   GFRNONAA 45 (L) 12/26/2020     Hepatic Lab Results  Component Value Date   AST 21 08/17/2017   ALT 20 08/17/2017   ALBUMIN 3.7 08/17/2017   ALKPHOS 44 08/17/2017     Electrolytes Lab Results  Component Value Date   NA 133 (L) 12/26/2020   K 4.7 12/26/2020   CL 100 12/26/2020   CALCIUM 9.3 12/26/2020   MG 2.0 07/23/2018   PHOS 4.4 09/10/2017     Bone No results found for: VD25OH, VD125OH2TOT, GO7703EK3, TC4818HT0, 25OHVITD1, 25OHVITD2, 25OHVITD3, TESTOFREE, TESTOSTERONE   Inflammation (CRP: Acute Phase) (ESR: Chronic Phase) Lab Results  Component Value Date   LATICACIDVEN 1.4 09/09/2017       Note: Above Lab results reviewed.  Recent Imaging Review  CT Head Wo Contrast CLINICAL DATA:  Low blood pressure, weakness, headache, fatigue, and dizziness for 2 weeks, had medications adjusted a month ago  EXAM: CT HEAD WITHOUT CONTRAST  TECHNIQUE: Contiguous axial images were obtained from the base of the skull through the vertex without intravenous contrast. Sagittal and coronal MPR images reconstructed from axial data set.  COMPARISON:  None  FINDINGS: Brain: Mild age-related atrophy. Normal ventricular morphology. No midline shift or mass effect. Otherwise normal appearance of brain parenchyma. No intracranial hemorrhage, mass lesion, or acute infarction. No extra-axial fluid collections.  Vascular: Atherosclerotic calcification of internal carotid arteries at skull base  Skull: Calvaria intact. Small lucencies are seen within the RIGHT sphenoid bone at the floor of the RIGHT middle cranial fossa anteriorly, appear benign, question arachnoid granulations  Sinuses/Orbits: Clear  Other: N/A  IMPRESSION: No  acute intracranial abnormalities.  Electronically Signed   By: Lavonia Dana M.D.   On: 12/26/2020 17:24 DG Chest 2 View CLINICAL DATA:  Weakness, hypotension, CHF, dizziness, headaches, symptoms for 2 weeks  EXAM: CHEST - 2 VIEW  COMPARISON:  07/20/2018  FINDINGS: Normal heart size post CABG.  Mediastinal contours and pulmonary vascularity normal.  Lungs clear.  No pulmonary infiltrate, pleural effusion, or pneumothorax.  Scattered degenerative disc disease changes thoracic spine.  IMPRESSION: No acute abnormalities.  Electronically Signed   By: Lavonia Dana M.D.   On: 12/26/2020 16:31 Note: Reviewed        Physical Exam  General appearance: Well nourished, well developed, and well hydrated. In no apparent acute distress Mental status: Alert, oriented x 3 (person, place, & time)       Respiratory: No evidence of acute respiratory distress Eyes: PERLA Vitals: BP 115/76   Pulse 93  Temp (!) 96.9 F (36.1 C) (Temporal)   Resp 16   Ht _0  (1.702 m)   Wt 199 lb (90.3 kg)   SpO2 100%   BMI 31.17 kg/m  BMI: Estimated body mass index is 31.17 kg/m as calculated from the following:   Height as of this encounter: _1  (1.702 m).   Weight as of this encounter: 199 lb (90.3 kg). Ideal: Ideal body weight: 66.1 kg (145 lb 11.6 oz) Adjusted ideal body weight: 75.8 kg (167 lb 0.6 oz)    Cervical Spine Area Exam  Skin & Axial Inspection: No masses, redness, edema, swelling, or associated skin lesions Alignment: Symmetrical Functional ROM: Unrestricted ROM      Stability: No instability detected Muscle Tone/Strength: Functionally intact. No obvious neuro-muscular anomalies detected. Sensory (Neurological): Unimpaired   Upper Extremity (UE) Exam      Side: Right upper extremity   Side: Left upper extremity  Skin & Extremity Inspection: Skin color, temperature, and hair growth are WNL. No peripheral edema or cyanosis. No masses, redness, swelling, asymmetry, or  associated skin lesions. No contractures.   Skin & Extremity Inspection: Skin color, temperature, and hair growth are WNL. No peripheral edema or cyanosis. No masses, redness, swelling, asymmetry, or associated skin lesions. No contractures.  Functional ROM: Pain restricted ROM for shoulder, improved after axillary nerve stimulation   Functional ROM: Unrestricted ROM          Muscle Tone/Strength: Functionally intact. No obvious neuro-muscular anomalies detected.   Muscle Tone/Strength: Functionally intact. No obvious neuro-muscular anomalies detected.  Sensory (Neurological): Arthropathic arthralgia, improved after axillary nerve stimulation           Sensory (Neurological): Unimpaired          Palpation: No palpable anomalies               Palpation: No palpable anomalies              Provocative Test(s):  Phalen's test: deferred Tinel's test: deferred Apley's scratch test (touch opposite shoulder):  Action 1 (Across chest): Decreased ROM, improved after axillary nerve stimulation Action 2 (Overhead): Decreased ROM, improved after actually nerve stimulation Action 3 (LB reach): Decreased ROM, improved after axillary stimulation     Provocative Test(s):  Phalen's test: deferred Tinel's test: deferred Apley's scratch test (touch opposite shoulder):  Action 1 (Across chest): deferred Action 2 (Overhead): deferred Action 3 (LB reach): deferred   Assessment   Status Diagnosis  Worsening Worsening Worsening 1. Chronic right shoulder pain   2. Chronic pain syndrome   3. Primary osteoarthritis of right shoulder   4. Chronic pain of both shoulders   5. Congestive heart failure, unspecified HF chronicity, unspecified heart failure type ([place])   6. PAD (peripheral artery disease) ([place])      Plan of Care    Mr. JEREMIAH CURCI has a current medication list which includes the following long-term medication(s): albuterol, bupropion, carvedilol, furosemide, metformin, metformin, pravastatin,  sodium chloride, [START ON 01/24/2021] hydrocodone-acetaminophen, [START ON 02/23/2021] hydrocodone-acetaminophen, and [START ON 03/25/2021] hydrocodone-acetaminophen.  Pharmacotherapy (Medications Ordered): Meds ordered this encounter  Medications   HYDROcodone-acetaminophen (NORCO) 10-325 MG tablet    Sig: Take 1 tablet by mouth every 8 (eight) hours as needed for severe pain. Must last 30 days.    Dispense:  90 tablet    Refill:  0    Chronic Pain. (STOP Act - Not applicable). Fill one day early if closed on scheduled refill date.   HYDROcodone-acetaminophen ([place])  10-325 MG tablet    Sig: Take 1 tablet by mouth every 8 (eight) hours as needed for severe pain. Must last 30 days.    Dispense:  90 tablet    Refill:  0    Chronic Pain. (STOP Act - Not applicable). Fill one day early if closed on scheduled refill date.   HYDROcodone-acetaminophen (NORCO) 10-325 MG tablet    Sig: Take 1 tablet by mouth every 8 (eight) hours as needed for severe pain. Must last 30 days.    Dispense:  90 tablet    Refill:  0    Chronic Pain. (STOP Act - Not applicable). Fill one day early if closed on scheduled refill date.  Patient is tried gabapentin, Lyrica, Cymbalta which resulted in side effects including nausea, cognitive changes which the patient did not like.  Follow-up plan:   Return in about 3 months (around 04/21/2021) for Medication Management, in person.     s/p R GH shoulder joint injection 04/03/19, 08/26/19 (anterior approach), 03/23/2020- repeat prn; s/p right axillary nerve PNS         Recent Visits No visits were found meeting these conditions. Showing recent visits within past 90 days and meeting all other requirements Today's Visits Date Type Provider Dept  01/19/21 Office Visit Gillis Santa, MD [facility]  Showing today's visits and meeting all other requirements Future Appointments No visits were found meeting these conditions. Showing future appointments within next 90 days and  meeting all other requirements I discussed the assessment and treatment plan with the patient. The patient was provided an opportunity to ask questions and all were answered. The patient agreed with the plan and demonstrated an understanding of the instructions.  Patient advised to call back or seek an in-person evaluation if the symptoms or condition worsens.  Duration of encounter:80mnutes.  Note by: BGillis Santa MD Date: 01/19/2021; Time: 8:47 AM

## 2021-01-25 DIAGNOSIS — J9601 Acute respiratory failure with hypoxia: Secondary | ICD-10-CM | POA: Diagnosis not present

## 2021-01-25 DIAGNOSIS — J449 Chronic obstructive pulmonary disease, unspecified: Secondary | ICD-10-CM | POA: Diagnosis not present

## 2021-02-04 DIAGNOSIS — I428 Other cardiomyopathies: Secondary | ICD-10-CM | POA: Diagnosis not present

## 2021-02-08 DIAGNOSIS — J439 Emphysema, unspecified: Secondary | ICD-10-CM | POA: Diagnosis not present

## 2021-02-08 DIAGNOSIS — J84112 Idiopathic pulmonary fibrosis: Secondary | ICD-10-CM | POA: Diagnosis not present

## 2021-02-08 DIAGNOSIS — R0600 Dyspnea, unspecified: Secondary | ICD-10-CM | POA: Diagnosis not present

## 2021-02-25 DIAGNOSIS — J449 Chronic obstructive pulmonary disease, unspecified: Secondary | ICD-10-CM | POA: Diagnosis not present

## 2021-02-25 DIAGNOSIS — J9601 Acute respiratory failure with hypoxia: Secondary | ICD-10-CM | POA: Diagnosis not present

## 2021-03-09 DIAGNOSIS — I429 Cardiomyopathy, unspecified: Secondary | ICD-10-CM | POA: Diagnosis not present

## 2021-03-09 DIAGNOSIS — J9611 Chronic respiratory failure with hypoxia: Secondary | ICD-10-CM | POA: Diagnosis not present

## 2021-03-09 DIAGNOSIS — I2510 Atherosclerotic heart disease of native coronary artery without angina pectoris: Secondary | ICD-10-CM | POA: Diagnosis not present

## 2021-03-09 DIAGNOSIS — I5020 Unspecified systolic (congestive) heart failure: Secondary | ICD-10-CM | POA: Diagnosis not present

## 2021-03-09 DIAGNOSIS — Z951 Presence of aortocoronary bypass graft: Secondary | ICD-10-CM | POA: Diagnosis not present

## 2021-03-09 DIAGNOSIS — I9789 Other postprocedural complications and disorders of the circulatory system, not elsewhere classified: Secondary | ICD-10-CM | POA: Diagnosis not present

## 2021-03-09 DIAGNOSIS — I739 Peripheral vascular disease, unspecified: Secondary | ICD-10-CM | POA: Diagnosis not present

## 2021-03-09 DIAGNOSIS — I519 Heart disease, unspecified: Secondary | ICD-10-CM | POA: Diagnosis not present

## 2021-03-09 DIAGNOSIS — I214 Non-ST elevation (NSTEMI) myocardial infarction: Secondary | ICD-10-CM | POA: Diagnosis not present

## 2021-03-18 DIAGNOSIS — I959 Hypotension, unspecified: Secondary | ICD-10-CM | POA: Diagnosis not present

## 2021-03-18 DIAGNOSIS — R21 Rash and other nonspecific skin eruption: Secondary | ICD-10-CM | POA: Diagnosis not present

## 2021-03-18 DIAGNOSIS — Z794 Long term (current) use of insulin: Secondary | ICD-10-CM | POA: Diagnosis not present

## 2021-03-18 DIAGNOSIS — Z79899 Other long term (current) drug therapy: Secondary | ICD-10-CM | POA: Diagnosis not present

## 2021-03-18 DIAGNOSIS — M25511 Pain in right shoulder: Secondary | ICD-10-CM | POA: Diagnosis not present

## 2021-03-18 DIAGNOSIS — Z0000 Encounter for general adult medical examination without abnormal findings: Secondary | ICD-10-CM | POA: Diagnosis not present

## 2021-03-18 DIAGNOSIS — R5381 Other malaise: Secondary | ICD-10-CM | POA: Diagnosis not present

## 2021-03-18 DIAGNOSIS — E782 Mixed hyperlipidemia: Secondary | ICD-10-CM | POA: Diagnosis not present

## 2021-03-18 DIAGNOSIS — D649 Anemia, unspecified: Secondary | ICD-10-CM | POA: Diagnosis not present

## 2021-03-18 DIAGNOSIS — E1165 Type 2 diabetes mellitus with hyperglycemia: Secondary | ICD-10-CM | POA: Diagnosis not present

## 2021-03-18 DIAGNOSIS — E1142 Type 2 diabetes mellitus with diabetic polyneuropathy: Secondary | ICD-10-CM | POA: Diagnosis not present

## 2021-03-18 DIAGNOSIS — F331 Major depressive disorder, recurrent, moderate: Secondary | ICD-10-CM | POA: Diagnosis not present

## 2021-03-25 DIAGNOSIS — Z951 Presence of aortocoronary bypass graft: Secondary | ICD-10-CM | POA: Diagnosis not present

## 2021-03-25 DIAGNOSIS — I6529 Occlusion and stenosis of unspecified carotid artery: Secondary | ICD-10-CM | POA: Diagnosis not present

## 2021-03-25 DIAGNOSIS — F339 Major depressive disorder, recurrent, unspecified: Secondary | ICD-10-CM | POA: Diagnosis not present

## 2021-03-25 DIAGNOSIS — Z794 Long term (current) use of insulin: Secondary | ICD-10-CM | POA: Diagnosis not present

## 2021-03-25 DIAGNOSIS — J449 Chronic obstructive pulmonary disease, unspecified: Secondary | ICD-10-CM | POA: Diagnosis not present

## 2021-03-25 DIAGNOSIS — E1151 Type 2 diabetes mellitus with diabetic peripheral angiopathy without gangrene: Secondary | ICD-10-CM | POA: Diagnosis not present

## 2021-03-25 DIAGNOSIS — E119 Type 2 diabetes mellitus without complications: Secondary | ICD-10-CM | POA: Diagnosis not present

## 2021-03-25 DIAGNOSIS — D649 Anemia, unspecified: Secondary | ICD-10-CM | POA: Diagnosis not present

## 2021-03-25 DIAGNOSIS — J9610 Chronic respiratory failure, unspecified whether with hypoxia or hypercapnia: Secondary | ICD-10-CM | POA: Diagnosis not present

## 2021-03-25 DIAGNOSIS — I5020 Unspecified systolic (congestive) heart failure: Secondary | ICD-10-CM | POA: Diagnosis not present

## 2021-03-28 DIAGNOSIS — J9601 Acute respiratory failure with hypoxia: Secondary | ICD-10-CM | POA: Diagnosis not present

## 2021-03-28 DIAGNOSIS — J449 Chronic obstructive pulmonary disease, unspecified: Secondary | ICD-10-CM | POA: Diagnosis not present

## 2021-04-15 ENCOUNTER — Other Ambulatory Visit: Payer: Self-pay

## 2021-04-15 ENCOUNTER — Encounter: Payer: Self-pay | Admitting: Student in an Organized Health Care Education/Training Program

## 2021-04-15 ENCOUNTER — Ambulatory Visit
Payer: Medicare HMO | Attending: Student in an Organized Health Care Education/Training Program | Admitting: Student in an Organized Health Care Education/Training Program

## 2021-04-15 VITALS — BP 120/91 | HR 80 | Temp 97.0°F | Resp 16 | Ht 67.0 in | Wt 192.0 lb

## 2021-04-15 DIAGNOSIS — G8929 Other chronic pain: Secondary | ICD-10-CM | POA: Insufficient documentation

## 2021-04-15 DIAGNOSIS — G894 Chronic pain syndrome: Secondary | ICD-10-CM | POA: Insufficient documentation

## 2021-04-15 DIAGNOSIS — I739 Peripheral vascular disease, unspecified: Secondary | ICD-10-CM | POA: Insufficient documentation

## 2021-04-15 DIAGNOSIS — I6522 Occlusion and stenosis of left carotid artery: Secondary | ICD-10-CM | POA: Diagnosis not present

## 2021-04-15 DIAGNOSIS — M25511 Pain in right shoulder: Secondary | ICD-10-CM | POA: Insufficient documentation

## 2021-04-15 DIAGNOSIS — M19011 Primary osteoarthritis, right shoulder: Secondary | ICD-10-CM | POA: Insufficient documentation

## 2021-04-15 DIAGNOSIS — M25512 Pain in left shoulder: Secondary | ICD-10-CM | POA: Insufficient documentation

## 2021-04-15 DIAGNOSIS — E1140 Type 2 diabetes mellitus with diabetic neuropathy, unspecified: Secondary | ICD-10-CM | POA: Diagnosis not present

## 2021-04-15 DIAGNOSIS — I509 Heart failure, unspecified: Secondary | ICD-10-CM | POA: Insufficient documentation

## 2021-04-15 MED ORDER — HYDROCODONE-ACETAMINOPHEN 10-325 MG PO TABS: 1.0000 | ORAL_TABLET | Freq: Three times a day (TID) | ORAL | 0 refills | Status: DC | PRN

## 2021-04-15 NOTE — Progress Notes (Signed)
PROVIDER NOTE: Information contained herein reflects review and annotations entered in association with encounter. Interpretation of such information and data should be left to medically-trained personnel. Information provided to patient can be located elsewhere in the medical record under "Patient Instructions". Document created using STT-dictation technology, any transcriptional errors that may result from process are unintentional.    Patient: [name]  Service Category: E/M  Provider: Gillis Santa, MD  DOB: Jun 16, 1961  DOS: 04/15/2021  Specialty: Interventional Pain Management  MRN: [id number]  Setting: Ambulatory outpatient  PCP: Tracie Harrier, MD  Type: Established Patient    Referring Provider: Tracie Harrier, MD  Location: Office  Delivery: Face-to-face     HPI  [name], a 60 y.o. year old male, is here today because of his Chronic pain syndrome [G89.4]. [name] primary complain today is Leg Pain, Shoulder Pain, and Joint Pain Last encounter: My last encounter with him was on 01/19/21 Pertinent problems: [name] has PAD (peripheral artery disease) ([place]); Carotid stenosis; Carotid stenosis, symptomatic w/o infarct, left; CAD (coronary artery disease); Chronic right shoulder pain; S/P CABG (coronary artery bypass graft); DM (diabetes mellitus) ([place]); and Chronic pain syndrome on their pertinent problem list. Pain Assessment: Severity of Chronic pain is reported as a 1 /10. Location: Leg Right, Left/radiates down entire leg down to top and bottom of foot, effects all toes. Onset: More than a month ago. Quality: Aching, Throbbing, Burning, Discomfort. Timing: Constant. Modifying factor(s): medications. Vitals:  height is '5\' 7"'  (1.702 m) and weight is 192 lb (87.1 kg). His temperature is 97 F (36.1 C) (abnormal). His blood pressure is 120/91 (abnormal) and his pulse is 80. His respiration is 16 and oxygen saturation is 98%.   Reason for encounter: medication  management.    [name] follows up today for medication management: Refill of his hydrocodone.  He endorses burning pain of bilateral feet related to diabetic neuropathy.  His last A1c was 8.1.  He is currently not on insulin.  We discussed Qutenza treatment of bilateral feet for painful diabetic neuropathy.  Risks and benefits reviewed and patient would like to trial this.   Pharmacotherapy Assessment  Analgesic: Norco 10 mg 3 times daily as needed, quantity 90/month MME equals 30   Monitoring: [place] PMP: PDMP reviewed during this encounter.       Pharmacotherapy: No side-effects or adverse reactions reported. Compliance: No problems identified. Effectiveness: Clinically acceptable.  UDS:  Summary  Date Value Ref Range Status  10/20/2020 Note  Final    Comment:    ==================================================================== [employer] (MW) ==================================================================== Test                             Result       Flag       Units  Drug Present and Declared for Prescription Verification   Hydrocodone                    892          EXPECTED   ng/mg creat   Hydromorphone                  464          EXPECTED   ng/mg creat   Dihydrocodeine                 139          EXPECTED  ng/mg creat   Norhydrocodone                 1136         EXPECTED   ng/mg creat    Sources of hydrocodone include scheduled prescription medications.    Hydromorphone, dihydrocodeine and norhydrocodone are expected    metabolites of hydrocodone. Hydromorphone and dihydrocodeine are    also available as scheduled prescription medications.  ==================================================================== Test                      Result    Flag   Units      Ref Range   Creatinine              36               mg/dL      >=20 ==================================================================== Declared Medications:  The flagging and interpretation on  this report are based on the  following declared medications.  Unexpected results may arise from  inaccuracies in the declared medications.   **Note: The testing scope of this panel includes these medications:   Hydrocodone (Norco)   **Note: The testing scope of this panel does not include the  following reported medications:   Acetaminophen (Tylenol)  Acetaminophen (Norco)  Albuterol (Ventolin HFA)  Aspirin  Carvedilol (Coreg)  Furosemide (Lasix)  Metformin (Glucophage)  Pirfenidone (Esbriet)  Pravastatin (Pravachol)  Sodium Chloride  Spironolactone (Aldactone)  Tamsulosin (Flomax) ==================================================================== For clinical consultation, please call [phone]. ====================================================================       ROS  Constitutional: Denies any fever or chills Gastrointestinal: No reported hemesis, hematochezia, vomiting, or acute GI distress Musculoskeletal:  + right shoulder pain Neurological:  Paresthesias of bilateral feet  Medication Review  HYDROcodone-acetaminophen, Pirfenidone, acetaminophen, albuterol, aspirin EC, buPROPion, carvedilol, furosemide, meclizine, metFORMIN, pravastatin, sodium chloride, spironolactone, and tamsulosin  History Review  Allergy: [name] has No Known Allergies. Drug: [name]  reports no history of drug use. Alcohol:  reports current alcohol use. Tobacco:  reports that he quit smoking about 7 years ago. His smoking use included cigarettes. He has quit using smokeless tobacco. Social: [name]  reports that he quit smoking about 7 years ago. His smoking use included cigarettes. He has quit using smokeless tobacco. He reports current alcohol use. He reports that he does not use drugs. Medical:  has a past medical history of Cancer ([place]), CHF (congestive heart failure) ([place]), COPD (chronic obstructive pulmonary disease) ([place]), Coronary artery disease, Coronary artery  disease, Diabetes mellitus without complication ([place]), Dyspnea, Fibromyalgia, History of kidney stones, IPF (idiopathic pulmonary fibrosis) ([place]), MI (myocardial infarction) ([place]), Peripheral vascular disease ([place]), and Pneumonia. Surgical: [name]  has a past surgical history that includes Cardiac catheterization (N/A, 06/09/2015); Cardiac catheterization (06/09/2015); Coronary artery bypass graft (2015); Cardiac catheterization; Application if wound vac; Wound debridement; stent in leg (Left); Endarterectomy (Left, 09/08/2017); and Lower Extremity Angiography (Right, 01/29/2019). Family: family history includes Cancer in his father; Heart attack in his mother.  Laboratory Chemistry Profile   Renal Lab Results  Component Value Date   BUN 42 (H) 12/26/2020   CREATININE 1.70 (H) 12/26/2020   GFRAA >60 01/29/2019   GFRNONAA 45 (L) 12/26/2020     Hepatic Lab Results  Component Value Date   AST 21 08/17/2017   ALT 20 08/17/2017   ALBUMIN 3.7 08/17/2017   ALKPHOS 44 08/17/2017     Electrolytes Lab Results  Component Value Date   NA 133 (L) 12/26/2020  K 4.7 12/26/2020   CL 100 12/26/2020   CALCIUM 9.3 12/26/2020   MG 2.0 07/23/2018   PHOS 4.4 09/10/2017     Bone No results found for: VD25OH, [id number], WG6659DJ5, TS1779TJ0, 25OHVITD1, 25OHVITD2, 25OHVITD3, TESTOFREE, TESTOSTERONE   Inflammation (CRP: Acute Phase) (ESR: Chronic Phase) Lab Results  Component Value Date   LATICACIDVEN 1.4 09/09/2017       Note: Above Lab results reviewed.  Recent Imaging Review  CT Head Wo Contrast CLINICAL DATA:  Low blood pressure, weakness, headache, fatigue, and dizziness for 2 weeks, had medications adjusted a month ago  EXAM: CT HEAD WITHOUT CONTRAST  TECHNIQUE: Contiguous axial images were obtained from the base of the skull through the vertex without intravenous contrast. Sagittal and coronal MPR images reconstructed from axial data set.  COMPARISON:   None  FINDINGS: Brain: Mild age-related atrophy. Normal ventricular morphology. No midline shift or mass effect. Otherwise normal appearance of brain parenchyma. No intracranial hemorrhage, mass lesion, or acute infarction. No extra-axial fluid collections.  Vascular: Atherosclerotic calcification of internal carotid arteries at skull base  Skull: Calvaria intact. Small lucencies are seen within the RIGHT sphenoid bone at the floor of the RIGHT middle cranial fossa anteriorly, appear benign, question arachnoid granulations  Sinuses/Orbits: Clear  Other: N/A  IMPRESSION: No acute intracranial abnormalities.  Electronically Signed   By: Lavonia Dana M.D.   On: 12/26/2020 17:24 DG Chest 2 View CLINICAL DATA:  Weakness, hypotension, CHF, dizziness, headaches, symptoms for 2 weeks  EXAM: CHEST - 2 VIEW  COMPARISON:  07/20/2018  FINDINGS: Normal heart size post CABG.  Mediastinal contours and pulmonary vascularity normal.  Lungs clear.  No pulmonary infiltrate, pleural effusion, or pneumothorax.  Scattered degenerative disc disease changes thoracic spine.  IMPRESSION: No acute abnormalities.  Electronically Signed   By: Lavonia Dana M.D.   On: 12/26/2020 16:31 Note: Reviewed        Physical Exam  General appearance: Well nourished, well developed, and well hydrated. In no apparent acute distress Mental status: Alert, oriented x 3 (person, place, & time)       Respiratory: No evidence of acute respiratory distress Eyes: PERLA Vitals: BP (!) 120/91   Pulse 80   Temp (!) 97 F (36.1 C)   Resp 16   Ht '5\' 7"'  (1.702 m)   Wt 192 lb (87.1 kg)   SpO2 98%   BMI 30.07 kg/m  BMI: Estimated body mass index is 30.07 kg/m as calculated from the following:   Height as of this encounter: '5\' 7"'  (1.702 m).   Weight as of this encounter: 192 lb (87.1 kg). Ideal: Ideal body weight: 66.1 kg (145 lb 11.6 oz) Adjusted ideal body weight: 74.5 kg (164 lb 3.8 oz)    Cervical  Spine Area Exam  Skin & Axial Inspection: No masses, redness, edema, swelling, or associated skin lesions Alignment: Symmetrical Functional ROM: Unrestricted ROM      Stability: No instability detected Muscle Tone/Strength: Functionally intact. No obvious neuro-muscular anomalies detected. Sensory (Neurological): Unimpaired   Upper Extremity (UE) Exam      Side: Right upper extremity   Side: Left upper extremity  Skin & Extremity Inspection: Skin color, temperature, and hair growth are WNL. No peripheral edema or cyanosis. No masses, redness, swelling, asymmetry, or associated skin lesions. No contractures.   Skin & Extremity Inspection: Skin color, temperature, and hair growth are WNL. No peripheral edema or cyanosis. No masses, redness, swelling, asymmetry, or associated skin lesions. No contractures.  Functional  ROM: Pain restricted ROM for shoulder, improved after axillary nerve stimulation   Functional ROM: Unrestricted ROM          Muscle Tone/Strength: Functionally intact. No obvious neuro-muscular anomalies detected.   Muscle Tone/Strength: Functionally intact. No obvious neuro-muscular anomalies detected.  Sensory (Neurological): Arthropathic arthralgia, improved after axillary nerve stimulation           Sensory (Neurological): Unimpaired          Palpation: No palpable anomalies               Palpation: No palpable anomalies              Provocative Test(s):  Phalen's test: deferred Tinel's test: deferred Apley's scratch test (touch opposite shoulder):  Action 1 (Across chest): Decreased ROM, improved after axillary nerve stimulation Action 2 (Overhead): Decreased ROM, improved after actually nerve stimulation Action 3 (LB reach): Decreased ROM, improved after axillary stimulation     Provocative Test(s):  Phalen's test: deferred Tinel's test: deferred Apley's scratch test (touch opposite shoulder):  Action 1 (Across chest): deferred Action 2 (Overhead): deferred Action 3 (LB  reach): deferred   Lower Extremity Exam    Side: Right lower extremity  Side: Left lower extremity  Stability: No instability observed          Stability: No instability observed          Skin & Extremity Inspection: Skin color, temperature, and hair growth are WNL. No peripheral edema or cyanosis. No masses, redness, swelling, asymmetry, or associated skin lesions. No contractures.  Skin & Extremity Inspection: Skin color, temperature, and hair growth are WNL. No peripheral edema or cyanosis. No masses, redness, swelling, asymmetry, or associated skin lesions. No contractures.  Functional ROM: Unrestricted ROM                  Functional ROM: Unrestricted ROM                  Muscle Tone/Strength: Functionally intact. No obvious neuro-muscular anomalies detected.  Muscle Tone/Strength: Functionally intact. No obvious neuro-muscular anomalies detected.  Sensory (Neurological): Neurogenic pain pattern        Sensory (Neurological): Neurogenic pain pattern        DTR: Patellar: deferred today Achilles: deferred today Plantar: deferred today  DTR: Patellar: deferred today Achilles: deferred today Plantar: deferred today  Palpation: No palpable anomalies  Palpation: No palpable anomalies    Assessment   Status Diagnosis  Worsening Worsening Persistent 1. Chronic pain syndrome   2. Chronic painful diabetic neuropathy ([place])   3. Chronic right shoulder pain   4. Primary osteoarthritis of right shoulder   5. Chronic pain of both shoulders   6. Congestive heart failure, unspecified HF chronicity, unspecified heart failure type ([place])   7. PAD (peripheral artery disease) ([place])   8. Carotid stenosis, symptomatic w/o infarct, left       Plan of Care    [name] has a current medication list which includes the following long-term medication(s): albuterol, bupropion, furosemide, metformin, pravastatin, sodium chloride, carvedilol, [START ON 04/25/2021] hydrocodone-acetaminophen,  [START ON 05/25/2021] hydrocodone-acetaminophen, [START ON 06/24/2021] hydrocodone-acetaminophen, and metformin.  Recommend Qutenza treatment for bilateral lower extremity painful diabetic neuropathy.  Patient has failed various neuropathic agents which resulted in cognitive side effects including gabapentin, Cymbalta, Lyrica.   Pharmacotherapy (Medications Ordered): Meds ordered this encounter  Medications   HYDROcodone-acetaminophen (NORCO) 10-325 MG tablet    Sig: Take 1 tablet by mouth every 8 (eight)  hours as needed for severe pain. Must last 30 days.    Dispense:  90 tablet    Refill:  0    Chronic Pain. (STOP Act - Not applicable). Fill one day early if closed on scheduled refill date.   HYDROcodone-acetaminophen (NORCO) 10-325 MG tablet    Sig: Take 1 tablet by mouth every 8 (eight) hours as needed for severe pain. Must last 30 days.    Dispense:  90 tablet    Refill:  0    Chronic Pain. (STOP Act - Not applicable). Fill one day early if closed on scheduled refill date.   HYDROcodone-acetaminophen (NORCO) 10-325 MG tablet    Sig: Take 1 tablet by mouth every 8 (eight) hours as needed for severe pain. Must last 30 days.    Dispense:  90 tablet    Refill:  0    Chronic Pain. (STOP Act - Not applicable). Fill one day early if closed on scheduled refill date.   Orders Placed This Encounter  Procedures   NEUROLYSIS    Please order Qutenza patches from pharmacy    Standing Status:   Future    Standing Expiration Date:   07/15/2021    Order Specific Question:   Where will this procedure be performed?    Answer:   [facility]     Patient is tried gabapentin, Lyrica, Cymbalta which resulted in side effects including nausea, cognitive changes which the patient did not like.  Follow-up plan:   Return in about 3 months (around 07/15/2021) for Medication Management, in person.     s/p R GH shoulder joint injection 04/03/19, 08/26/19 (anterior approach), 03/23/2020- repeat prn; s/p  right axillary nerve PNS         Recent Visits Date Type Provider Dept  01/19/21 Office Visit Gillis Santa, MD [facility]  Showing recent visits within past 90 days and meeting all other requirements Today's Visits Date Type Provider Dept  04/15/21 Office Visit Gillis Santa, MD [facility]  Showing today's visits and meeting all other requirements Future Appointments No visits were found meeting these conditions. Showing future appointments within next 90 days and meeting all other requirements I discussed the assessment and treatment plan with the patient. The patient was provided an opportunity to ask questions and all were answered. The patient agreed with the plan and demonstrated an understanding of the instructions.  Patient advised to call back or seek an in-person evaluation if the symptoms or condition worsens.  Duration of encounter:53mnutes.  Note by: BGillis Santa MD Date: 04/15/2021; Time: 11:29 AM

## 2021-04-15 NOTE — Progress Notes (Signed)
Nursing Pain Medication Assessment:  Safety precautions to be maintained throughout the outpatient stay will include: orient to surroundings, keep bed in low position, maintain call bell within reach at all times, provide assistance with transfer out of bed and ambulation.  Medication Inspection Compliance: Pill count conducted under aseptic conditions, in front of the patient. Neither the pills nor the bottle was removed from the patient's sight at any time. Once count was completed pills were immediately returned to the patient in their original bottle.  Medication: See above Pill/Patch Count:  34 of 90 pills remain Pill/Patch Appearance: Markings consistent with prescribed medication Bottle Appearance: Standard pharmacy container. Clearly labeled. Filled Date: 79 / 3 / 2022 Last Medication intake:  Today

## 2021-04-27 DIAGNOSIS — J9601 Acute respiratory failure with hypoxia: Secondary | ICD-10-CM | POA: Diagnosis not present

## 2021-04-27 DIAGNOSIS — J449 Chronic obstructive pulmonary disease, unspecified: Secondary | ICD-10-CM | POA: Diagnosis not present

## 2021-04-28 ENCOUNTER — Ambulatory Visit
Payer: Medicare HMO | Attending: Student in an Organized Health Care Education/Training Program | Admitting: Student in an Organized Health Care Education/Training Program

## 2021-04-28 ENCOUNTER — Other Ambulatory Visit: Payer: Self-pay

## 2021-04-28 ENCOUNTER — Encounter: Payer: Self-pay | Admitting: Student in an Organized Health Care Education/Training Program

## 2021-04-28 DIAGNOSIS — E1140 Type 2 diabetes mellitus with diabetic neuropathy, unspecified: Secondary | ICD-10-CM | POA: Insufficient documentation

## 2021-04-28 DIAGNOSIS — G894 Chronic pain syndrome: Secondary | ICD-10-CM | POA: Insufficient documentation

## 2021-04-28 MED ORDER — CAPSAICIN-CLEANSING GEL 8 % EX KIT
4.0000 | PACK | Freq: Once | CUTANEOUS | Status: AC
  Administered 2021-04-28: 1 via TOPICAL

## 2021-04-28 NOTE — Progress Notes (Signed)
PROVIDER NOTE: Information contained herein reflects review and annotations entered in association with encounter. Interpretation of such information and data should be left to medically-trained personnel. Information provided to patient can be located elsewhere in the medical record under "Patient Instructions". Document created using STT-dictation technology, any transcriptional errors that may result from process are unintentional.    Patient: [name]  Service Category: Procedure  Provider: Gillis Santa, MD  DOB: 09/18/1960  DOS: 04/28/2021  Location: [facility]  MRN: [id number]  Setting: Ambulatory - outpatient  Referring Provider: Gillis Santa, MD  Type: Established Patient  Specialty: Interventional Pain Management  PCP: Tracie Harrier, MD   Primary Reason for Visit: Interventional Pain Management Treatment. CC: Foot Pain (bilat)    Procedure:            Qutenza application to bilateral feet for painful diabetic neuropathy     Indications: 1. Chronic pain syndrome   2. Chronic painful diabetic neuropathy (HCC)    Pain Score: Pre-procedure: 6 /10 Post-procedure: 6 /10     Pre-op H&P Assessment:  Mr. Rayburn is a 60 y.o. (year old), male patient, seen today for interventional treatment. He  has a past surgical history that includes Cardiac catheterization (N/A, 06/09/2015); Cardiac catheterization (06/09/2015); Coronary artery bypass graft (2015); Cardiac catheterization; Application if wound vac; Wound debridement; stent in leg (Left); Endarterectomy (Left, 09/08/2017); and Lower Extremity Angiography (Right, 01/29/2019). Mr. Ethington has a current medication list which includes the following prescription(s): acetaminophen, albuterol, aspirin ec, bupropion, furosemide, hydrocodone-acetaminophen, [START ON 05/25/2021] hydrocodone-acetaminophen, [START ON 06/24/2021] hydrocodone-acetaminophen, metformin, esbriet, pravastatin, sodium chloride, spironolactone, tamsulosin,  carvedilol, meclizine, and metformin. His primarily concern today is the Foot Pain (bilat)  Initial Vital Signs:  Pulse/HCG Rate: 85  Temp: (!) 97.2 F (36.2 C) Resp: 16 BP: 108/66 SpO2: 98 %  BMI: Estimated body mass index is 29.76 kg/m as calculated from the following:   Height as of this encounter: 5\' 7"  (1.702 m).   Weight as of this encounter: 190 lb (86.2 kg).  Risk Assessment: Allergies: Reviewed. He has No Known Allergies.  Allergy Precautions: None required Coagulopathies: Reviewed. None identified.  Blood-thinner therapy: None at this time Active Infection(s): Reviewed. None identified. Mr. Glazier is afebrile  Site Confirmation: Mr. Fedewa was asked to confirm the procedure and laterality before marking the site Procedure checklist: Completed Consent: Before the procedure and under the influence of no sedative(s), amnesic(s), or anxiolytics, the patient was informed of the treatment options, risks and possible complications. To fulfill our ethical and legal obligations, as recommended by the American Medical Association's Code of Ethics, I have informed the patient of my clinical impression; the nature and purpose of the treatment or procedure; the risks, benefits, and possible complications of the intervention; the alternatives, including doing nothing; the risk(s) and benefit(s) of the alternative treatment(s) or procedure(s); and the risk(s) and benefit(s) of doing nothing. The patient was provided information about the general risks and possible complications associated with the procedure. These may include, but are not limited to: failure to achieve desired goals, infection, bleeding, organ or nerve damage, allergic reactions, paralysis, and death. In addition, the patient was informed of those risks and complications associated to the procedure, such as failure to decrease pain; infection; bleeding; organ or nerve damage with subsequent damage to sensory, motor, and/or  autonomic systems, resulting in permanent pain, numbness, and/or weakness of one or several areas of the body; allergic reactions; (i.e.: anaphylactic reaction); and/or death. Furthermore, the patient was  informed of those risks and complications associated with the medications. These include, but are not limited to: allergic reactions (i.e.: anaphylactic or anaphylactoid reaction(s)); adrenal axis suppression; blood sugar elevation that in diabetics may result in ketoacidosis or comma; water retention that in patients with history of congestive heart failure may result in shortness of breath, pulmonary edema, and decompensation with resultant heart failure; weight gain; swelling or edema; medication-induced neural toxicity; particulate matter embolism and blood vessel occlusion with resultant organ, and/or nervous system infarction; and/or aseptic necrosis of one or more joints. Finally, the patient was informed that Medicine is not an exact science; therefore, there is also the possibility of unforeseen or unpredictable risks and/or possible complications that may result in a catastrophic outcome. The patient indicated having understood very clearly. We have given the patient no guarantees and we have made no promises. Enough time was given to the patient to ask questions, all of which were answered to the patient's satisfaction. Mr. Maynes has indicated that he wanted to continue with the procedure. Attestation: I, the ordering provider, attest that I have discussed with the patient the benefits, risks, side-effects, alternatives, likelihood of achieving goals, and potential problems during recovery for the procedure that I have provided informed consent. Date  Time: 04/28/2021  1:36 PM  Pre-Procedure Preparation:  Monitoring: As per clinic protocol. Respiration, ETCO2, SpO2, BP, heart rate and rhythm monitor placed and checked for adequate function Safety Precautions: Patient was assessed for positional  comfort and pressure points before starting the procedure. Time-out: I initiated and conducted the "Time-out" before starting the procedure, as per protocol. The patient was asked to participate by confirming the accuracy of the "Time Out" information. Verification of the correct person, site, and procedure were performed and confirmed by me, the nursing staff, and the patient. "Time-out" conducted as per Joint Commission's Universal Protocol (UP.01.01.01). Time: 1353  Description of Procedure:          Painful area was marked on bilateral feet.  Capsaicin patch was applied to the plantar and dorsal aspect of each feet.    Vitals:   04/28/21 1341  BP: 108/66  Pulse: 85  Resp: 16  Temp: (!) 97.2 F (36.2 C)  TempSrc: Temporal  SpO2: 98%  Weight: 190 lb (86.2 kg)  Height: 5\' 7"  (1.702 m)    Start Time: 1354 hrs. End Time: 1434 hrs.  Post-operative Assessment:  Post-procedure Vital Signs:  Pulse/HCG Rate: 85  Temp:  (!) 97.2 F (36.2 C) Resp: 16 BP: 108/66 SpO2: 98 %  EBL: None  Complications: No immediate post-treatment complications observed by team, or reported by patient.  Note: The patient tolerated the entire procedure well. A repeat set of vitals were taken after the procedure and the patient was kept under observation following institutional policy, for this type of procedure. Post-procedural neurological assessment was performed, showing return to baseline, prior to discharge. The patient was provided with post-procedure discharge instructions, including a section on how to identify potential problems. Should any problems arise concerning this procedure, the patient was given instructions to immediately contact us, at any time, without hesitation. In any case, we plan to contact the patient by telephone for a follow-up status report regarding this interventional procedure.  Comments:  No additional relevant information.  Plan of Care    Chronic Opioid Analgesic:   Norco 10 mg 3 times daily as needed, quantity 90/month MME equals 30   Medications ordered for procedure: Meds ordered this encounter  Medications  capsaicin topical system 8 % patch 4 patch   Medications administered: We administered capsaicin topical system.  See the medical record for exact dosing, route, and time of administration.  Follow-up plan:   Return in about 6 weeks (around 06/09/2021) for PPE VV.       s/p R GH shoulder joint injection 04/03/19, 08/26/19 (anterior approach), 03/23/2020- repeat prn; s/p right axillary nerve PNS          Recent Visits Date Type Provider Dept  04/15/21 Office Visit Gillis Santa, MD [facility]  Showing recent visits within past 90 days and meeting all other requirements Today's Visits Date Type Provider Dept  04/28/21 Procedure visit Gillis Santa, MD [facility]  Showing today's visits and meeting all other requirements Future Appointments Date Type Provider Dept  06/09/21 Appointment Gillis Santa, MD [facility]  07/15/21 Appointment Gillis Santa, MD [facility]  Showing future appointments within next 90 days and meeting all other requirements Disposition: Discharge home  Discharge (Date  Time): 04/28/2021; 1445 hrs.   Primary Care Physician: Tracie Harrier, MD Location: [facility] Note by: Gillis Santa, MD Date: 04/28/2021; Time: 2:49 PM  Disclaimer:  Medicine is not an exact science. The only guarantee in medicine is that nothing is guaranteed. It is important to note that the decision to proceed with this intervention was based on the information collected from the patient. The Data and conclusions were drawn from the patient's questionnaire, the interview, and the physical examination. Because the information was provided in large part by the patient, it cannot be guaranteed that it has not been purposely or unconsciously manipulated. Every effort has been  made to obtain as much relevant data as possible for this evaluation. It is important to note that the conclusions that lead to this procedure are derived in large part from the available data. Always take into account that the treatment will also be dependent on availability of resources and existing treatment guidelines, considered by other Pain Management Practitioners as being common knowledge and practice, at the time of the intervention. For Medico-Legal purposes, it is also important to point out that variation in procedural techniques and pharmacological choices are the acceptable norm. The indications, contraindications, technique, and results of the above procedure should only be interpreted and judged by a Board-Certified Interventional Pain Specialist with extensive familiarity and expertise in the same exact procedure and technique.

## 2021-04-28 NOTE — Progress Notes (Signed)
Safety precautions to be maintained throughout the outpatient stay will include: orient to surroundings, keep bed in low position, maintain call bell within reach at all times, provide assistance with transfer out of bed and ambulation.  

## 2021-04-29 ENCOUNTER — Telehealth: Payer: Self-pay | Admitting: *Deleted

## 2021-04-29 NOTE — Telephone Encounter (Signed)
Called for post procedure check. Denies any issues. 

## 2021-05-24 IMAGING — US VENOUS DOPPLER ULTRASOUND OF  LOWER EXTREMITIES
1 series · 13 of 24 positions shown · non-contrast
Comparison: None.

CLINICAL DATA: Bilateral lower extremity pain and edema. Evaluate
for DVT.



[Series 1: venous doppler ultrasound of lower extremities · 0.09mm/px · 13 of 66 slices shown]
[im 1/66]
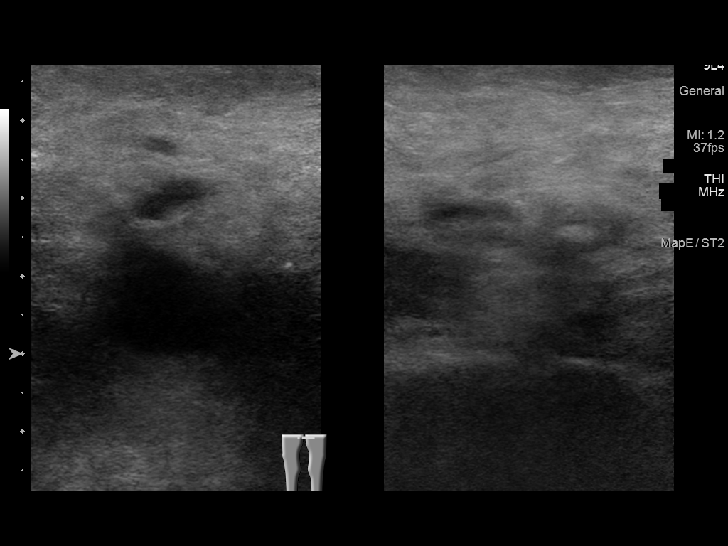
[im 6/66]
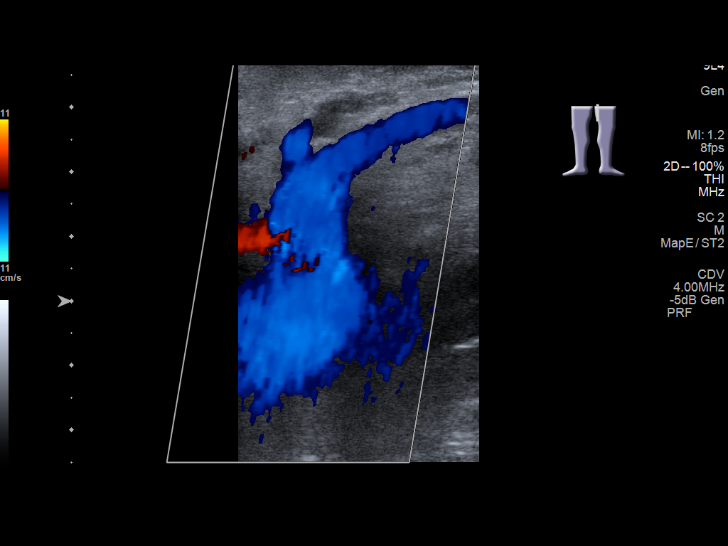
[im 12/66]
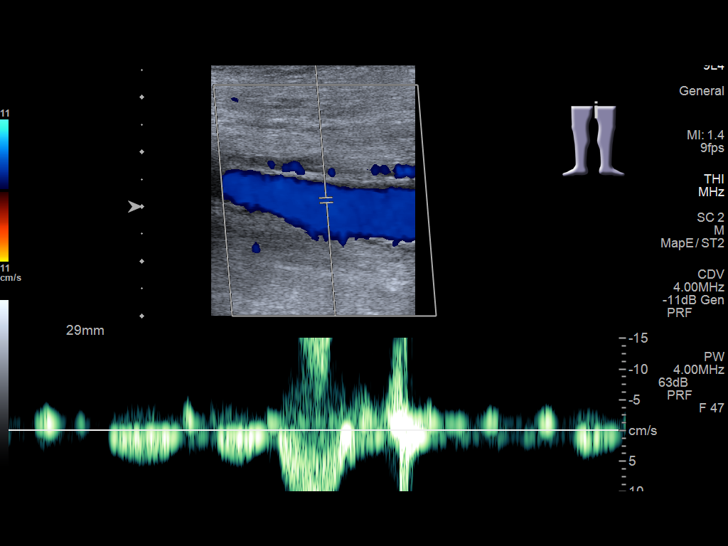
[im 17/66]
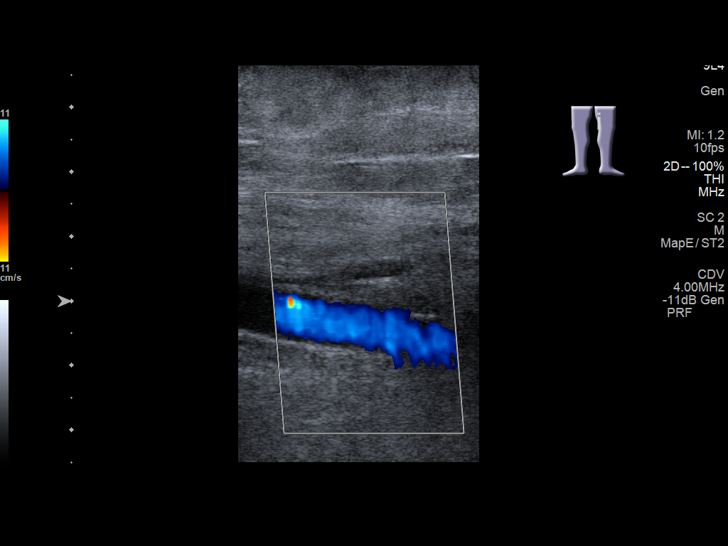
[im 23/66]
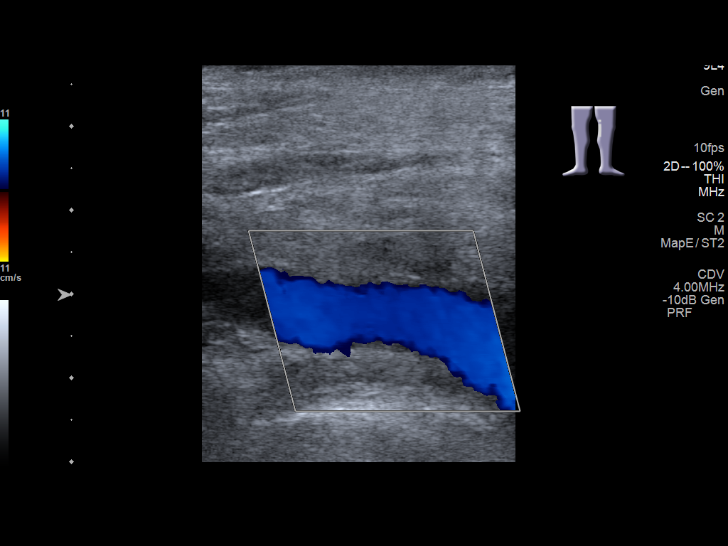
[im 29/66]
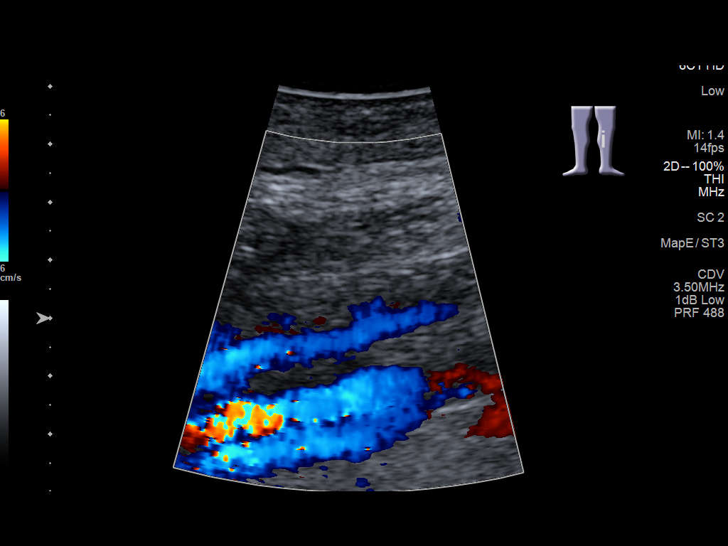
[im 34/66]
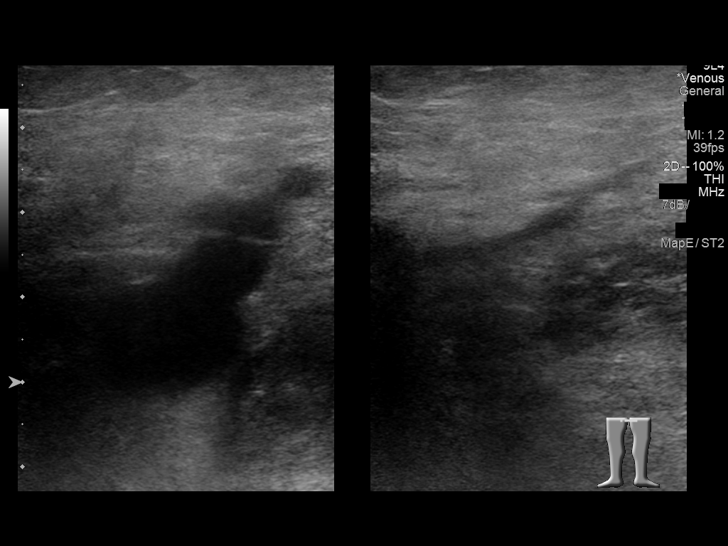
[im 37/66]
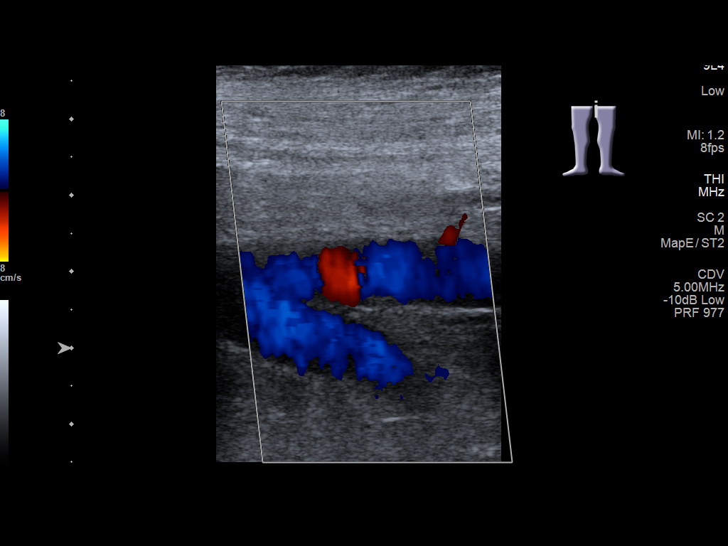
[im 43/66]
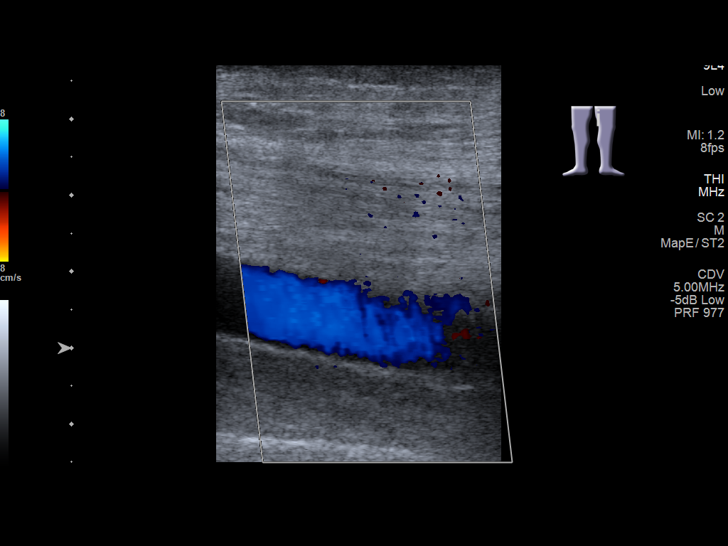
[im 49/66]
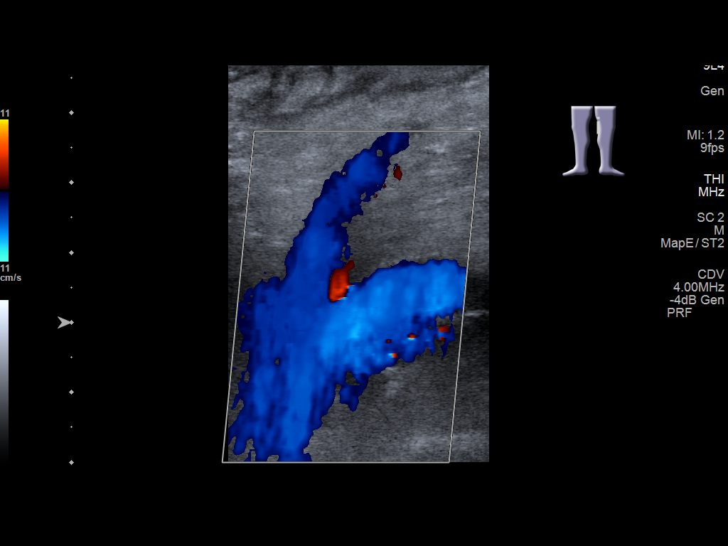
[im 54/66]
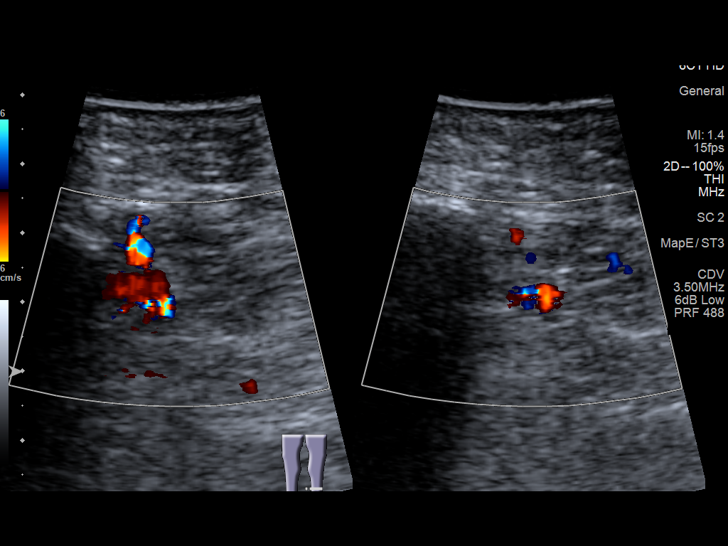
[im 60/66]
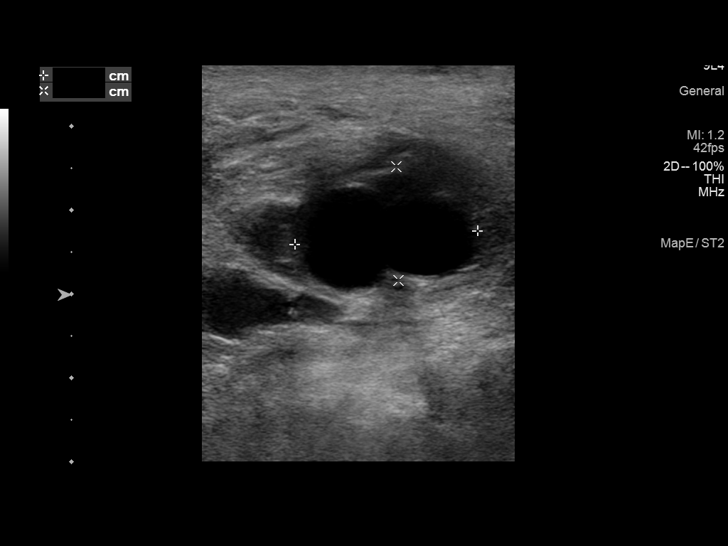
[im 66/66]
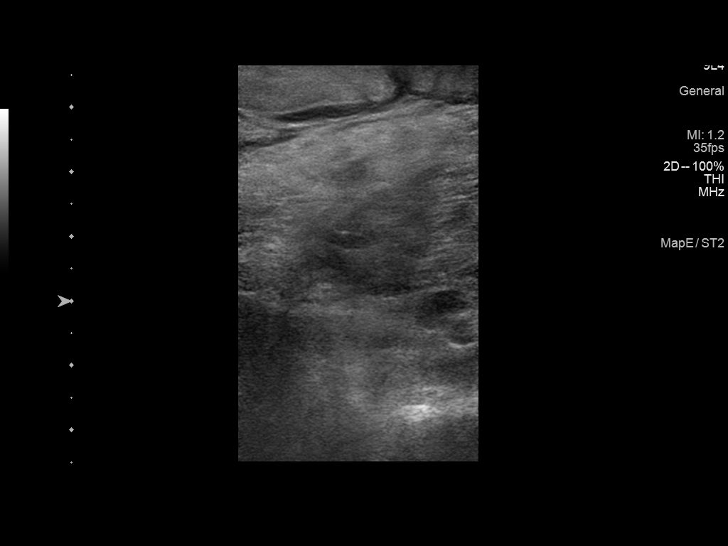

[13 of 24 positions shown; findings below may reference images not displayed]

FINDINGS: Examination is degraded due to patient body habitus and poor
sonographic window.

RIGHT LOWER EXTREMITY

Common Femoral Vein: No evidence of thrombus. Normal
compressibility, respiratory phasicity and response to augmentation.

Saphenofemoral Junction: No evidence of thrombus. Normal
compressibility and flow on color Doppler imaging.

Profunda Femoral Vein: No evidence of thrombus. Normal
compressibility and flow on color Doppler imaging.

Femoral Vein: No evidence of thrombus. Normal compressibility,
respiratory phasicity and response to augmentation.

Popliteal Vein: No evidence of thrombus. Normal compressibility,
respiratory phasicity and response to augmentation.

Calf Veins: No evidence of thrombus. Normal compressibility and flow
on color Doppler imaging.

Superficial Great Saphenous Vein: No evidence of thrombus. Normal
compressibility.

Venous Reflux:  None.

Other Findings:  None.

LEFT LOWER EXTREMITY

Common Femoral Vein: No evidence of thrombus. Normal
compressibility, respiratory phasicity and response to augmentation.

Saphenofemoral Junction: No evidence of thrombus. Normal
compressibility and flow on color Doppler imaging.

Profunda Femoral Vein: No evidence of thrombus. Normal
compressibility and flow on color Doppler imaging.

Femoral Vein: No evidence of thrombus. Normal compressibility,
respiratory phasicity and response to augmentation.

Popliteal Vein: No evidence of thrombus. Normal compressibility,
respiratory phasicity and response to augmentation.

Calf Veins: No evidence of thrombus. Normal compressibility and flow
on color Doppler imaging.

Superficial Great Saphenous Vein: No evidence of thrombus. Normal
compressibility.

Venous Reflux:  None.

Other Findings: Note is made of an approximately 3.0 x 2.2 x 1.4 cm
fluid collection with the left popliteal fossa compatible with a
Baker's cyst.
IMPRESSION: No evidence of DVT within either lower extremity.

## 2021-05-25 DIAGNOSIS — D2272 Melanocytic nevi of left lower limb, including hip: Secondary | ICD-10-CM | POA: Diagnosis not present

## 2021-05-25 DIAGNOSIS — D225 Melanocytic nevi of trunk: Secondary | ICD-10-CM | POA: Diagnosis not present

## 2021-05-25 DIAGNOSIS — L570 Actinic keratosis: Secondary | ICD-10-CM | POA: Diagnosis not present

## 2021-05-25 DIAGNOSIS — Z85828 Personal history of other malignant neoplasm of skin: Secondary | ICD-10-CM | POA: Diagnosis not present

## 2021-05-25 DIAGNOSIS — Z08 Encounter for follow-up examination after completed treatment for malignant neoplasm: Secondary | ICD-10-CM | POA: Diagnosis not present

## 2021-05-25 DIAGNOSIS — L821 Other seborrheic keratosis: Secondary | ICD-10-CM | POA: Diagnosis not present

## 2021-05-25 DIAGNOSIS — D2262 Melanocytic nevi of left upper limb, including shoulder: Secondary | ICD-10-CM | POA: Diagnosis not present

## 2021-05-25 DIAGNOSIS — D2261 Melanocytic nevi of right upper limb, including shoulder: Secondary | ICD-10-CM | POA: Diagnosis not present

## 2021-05-25 DIAGNOSIS — D2271 Melanocytic nevi of right lower limb, including hip: Secondary | ICD-10-CM | POA: Diagnosis not present

## 2021-05-28 DIAGNOSIS — J9601 Acute respiratory failure with hypoxia: Secondary | ICD-10-CM | POA: Diagnosis not present

## 2021-05-28 DIAGNOSIS — J449 Chronic obstructive pulmonary disease, unspecified: Secondary | ICD-10-CM | POA: Diagnosis not present

## 2021-06-08 ENCOUNTER — Encounter: Payer: Self-pay | Admitting: Student in an Organized Health Care Education/Training Program

## 2021-06-09 ENCOUNTER — Encounter: Payer: Self-pay | Admitting: Student in an Organized Health Care Education/Training Program

## 2021-06-09 ENCOUNTER — Ambulatory Visit
Payer: Medicare HMO | Attending: Student in an Organized Health Care Education/Training Program | Admitting: Student in an Organized Health Care Education/Training Program

## 2021-06-09 ENCOUNTER — Other Ambulatory Visit: Payer: Self-pay

## 2021-06-09 DIAGNOSIS — E1140 Type 2 diabetes mellitus with diabetic neuropathy, unspecified: Secondary | ICD-10-CM

## 2021-06-09 DIAGNOSIS — G894 Chronic pain syndrome: Secondary | ICD-10-CM | POA: Diagnosis not present

## 2021-06-09 NOTE — Progress Notes (Signed)
Patient: [name]  Service Category: E/M  Provider: Gillis Santa, MD  DOB: 1960/09/06  DOS: 06/09/2021  Location: Office  MRN: [id number]  Setting: Ambulatory outpatient  Referring Provider: Tracie Harrier, MD  Type: Established Patient  Specialty: Interventional Pain Management  PCP: Tracie Harrier, MD  Location: Home  Delivery: TeleHealth     Virtual Encounter - Pain Management PROVIDER NOTE: Information contained herein reflects review and annotations entered in association with encounter. Interpretation of such information and data should be left to medically-trained personnel. Information provided to patient can be located elsewhere in the medical record under "Patient Instructions". Document created using STT-dictation technology, any transcriptional errors that may result from process are unintentional.    Contact & Pharmacy Preferred: [place]: [phone] (home) Mobile: [phone] (mobile) E-mail: [email or web address]  [id number] [place]Phone: [phone]: [phone]  Pre-screening  [name] offered "in-person" vs "virtual" encounter. He indicated preferring virtual for this encounter.   Reason COVID-19*  Social distancing based on CDC and AMA recommendations.   I contacted John Sawyeron 06/09/2021 via telephone.      I clearly identified myself as BGillis Santa MD. I verified that I was speaking with the correct person using two identifiers (Name: [name] and date of birth: 8February 18, 1962.  Consent I sought verbal advanced consent from John Sawyerfor virtual visit interactions. I informed [name] of possible security and privacy concerns, risks, and limitations associated with providing "not-in-person" medical evaluation and management services. I also informed [name] of the availability of "in-person" appointments. Finally, I informed him that there  would be a charge for the virtual visit and that he could be  personally, fully or partially, financially responsible for it. Mr. WTrompeterexpressed understanding and agreed to proceed.   Historic Elements   Mr. John TASKERis a 60y.o. year old, male patient evaluated today after our last contact on 04/28/2021. Mr. [name] has a past medical history of Cancer ([facility], CHF (congestive heart failure) ([place], COPD (chronic obstructive pulmonary disease) ([place], Coronary artery disease, Coronary artery disease, Diabetes mellitus without complication ([place], Dyspnea, Fibromyalgia, History of kidney stones, IPF (idiopathic pulmonary fibrosis) ([place], [place] (myocardial infarction) ([place], Peripheral vascular disease ([place], and Pneumonia. He also  has a past surgical history that includes Cardiac catheterization (N/A, 06/09/2015); Cardiac catheterization (06/09/2015); Coronary artery bypass graft (2015); Cardiac catheterization; Application if wound vac; Wound debridement; stent in leg (Left); Endarterectomy (Left, 09/08/2017); and Lower Extremity Angiography (Right, 01/29/2019). Mr. WSolemhas a current medication list which includes the following prescription(s): acetaminophen, albuterol, aspirin ec, bupropion, furosemide, hydrocodone-acetaminophen, [START ON 06/24/2021] hydrocodone-acetaminophen, metformin, esbriet, pravastatin, sodium chloride, spironolactone, tamsulosin, carvedilol, hydrocodone-acetaminophen, meclizine, and metformin. He  reports that he quit smoking about 7 years ago. His smoking use included cigarettes. He has quit using smokeless tobacco. He reports current alcohol use. He reports that he does not use drugs. Mr. WTreptowhas No Known Allergies.   HPI  Today, he is being contacted for a post-procedure assessment.   Post-Procedure Evaluation  Procedure (04/28/2021):  Qutenza application to bilateral feet for painful diabetic neuropathy         Long-term benefit: Defined as any relief past the pharmacologic  duration of the local anesthetics.  Effectiveness past the initial 6 hours after procedure (Long-Term Relief): 30 % (No pain relief at top of foot, 30 % at bottom of left foot)  Benefits, current: Defined as benefit present at the time of this evaluation.   Analgesia:  10-15% for top of foot, however noticing >50% pain relief on the plantar surface. Patient would like to repeat.    Pharmacotherapy Assessment   Analgesic: Norco 10 mg 3 times daily as needed, quantity 90/month MME equals 30   Monitoring: [place] PMP: PDMP reviewed during this encounter.       Pharmacotherapy: No side-effects or adverse reactions reported. Compliance: No problems identified. Effectiveness: Clinically acceptable. Plan: Refer to "POC". UDS:  Summary  Date Value Ref Range Status  10/20/2020 Note  Final    Comment:    ==================================================================== [employer] Select 13 (MW) ==================================================================== Test                             Result       Flag       Units  Drug Present and Declared for Prescription Verification   Hydrocodone                    892          EXPECTED   ng/mg creat   Hydromorphone                  464          EXPECTED   ng/mg creat   Dihydrocodeine                 139          EXPECTED   ng/mg creat   Norhydrocodone                 1136         EXPECTED   ng/mg creat    Sources of hydrocodone include scheduled prescription medications.    Hydromorphone, dihydrocodeine and norhydrocodone are expected    metabolites of hydrocodone. Hydromorphone and dihydrocodeine are    also available as scheduled prescription medications.  ==================================================================== Test                      Result    Flag   Units      Ref Range   Creatinine              36               mg/dL      >=20 ==================================================================== Declared Medications:   The flagging and interpretation on this report are based on the  following declared medications.  Unexpected results may arise from  inaccuracies in the declared medications.   **Note: The testing scope of this panel includes these medications:   Hydrocodone (Norco)   **Note: The testing scope of this panel does not include the  following reported medications:   Acetaminophen (Tylenol)  Acetaminophen (Norco)  Albuterol (Ventolin HFA)  Aspirin  Carvedilol (Coreg)  Furosemide (Lasix)  Metformin (Glucophage)  Pirfenidone (Esbriet)  Pravastatin (Pravachol)  Sodium Chloride  Spironolactone (Aldactone)  Tamsulosin (Flomax) ==================================================================== For clinical consultation, please call [phone]. ====================================================================      Laboratory Chemistry Profile   Renal Lab Results  Component Value Date   BUN 42 (H) 12/26/2020   CREATININE 1.70 (H) 12/26/2020   GFRAA >60 01/29/2019   GFRNONAA 45 (L) 12/26/2020    Hepatic Lab Results  Component Value Date   AST 21 08/17/2017   ALT 20 08/17/2017   ALBUMIN 3.7 08/17/2017  ALKPHOS 44 08/17/2017    Electrolytes Lab Results  Component Value Date   NA 133 (L) 12/26/2020   K 4.7 12/26/2020   CL 100 12/26/2020   CALCIUM 9.3 12/26/2020   MG 2.0 07/23/2018   PHOS 4.4 09/10/2017    Bone No results found for: VD25OH, [id number], OV7858IF0, YD7412IN8, 25OHVITD1, 25OHVITD2, 25OHVITD3, TESTOFREE, TESTOSTERONE  Inflammation (CRP: Acute Phase) (ESR: Chronic Phase) Lab Results  Component Value Date   LATICACIDVEN 1.4 09/09/2017         Note: Above Lab results reviewed.  Imaging  CT Head Wo Contrast CLINICAL DATA:  Low blood pressure, weakness, headache, fatigue, and dizziness for 2 weeks, had medications adjusted a month ago  EXAM: CT HEAD WITHOUT CONTRAST  TECHNIQUE: Contiguous axial images were obtained from the base of the  skull through the vertex without intravenous contrast. Sagittal and coronal MPR images reconstructed from axial data set.  COMPARISON:  None  FINDINGS: Brain: Mild age-related atrophy. Normal ventricular morphology. No midline shift or mass effect. Otherwise normal appearance of brain parenchyma. No intracranial hemorrhage, mass lesion, or acute infarction. No extra-axial fluid collections.  Vascular: Atherosclerotic calcification of internal carotid arteries at skull base  Skull: Calvaria intact. Small lucencies are seen within the RIGHT sphenoid bone at the floor of the RIGHT middle cranial fossa anteriorly, appear benign, question arachnoid granulations  Sinuses/Orbits: Clear  Other: N/A  IMPRESSION: No acute intracranial abnormalities.  Electronically Signed   By: Lavonia Dana M.D.   On: 12/26/2020 17:24 DG Chest 2 View CLINICAL DATA:  Weakness, hypotension, CHF, dizziness, headaches, symptoms for 2 weeks  EXAM: CHEST - 2 VIEW  COMPARISON:  07/20/2018  FINDINGS: Normal heart size post CABG.  Mediastinal contours and pulmonary vascularity normal.  Lungs clear.  No pulmonary infiltrate, pleural effusion, or pneumothorax.  Scattered degenerative disc disease changes thoracic spine.  IMPRESSION: No acute abnormalities.  Electronically Signed   By: Lavonia Dana M.D.   On: 12/26/2020 16:31  Assessment  The primary encounter diagnosis was Chronic pain syndrome. A diagnosis of Chronic painful diabetic neuropathy (HCC) was also pertinent to this visit.  Plan of Care   Patient is noting significant pain relief on the plantar aspect of his foot after Qutenza therapy.  He is still having increased pain on the dorsal aspect but he states that the quality of it is difficult.  Overall he thinks that the procedure was helpful and he would like to repeat it to see if an additional therapy has a better and more significant response.  We will plan on doing this after  July 29, 2021.  Keep medication management visit for December 22 as scheduled.   Orders:  Orders Placed This Encounter  Procedures   NEUROLYSIS    Please order Qutenza patches from pharmacy    Standing Status:   Future    Standing Expiration Date:   09/09/2021    Order Specific Question:   Where will this procedure be performed?    Answer:   [facility]    Follow-up plan:   Return in about 8 weeks (around 08/02/2021) for Qutenza Tx #2.     s/p R GH shoulder joint injection 04/03/19, 08/26/19 (anterior approach), 03/23/2020- repeat prn; s/p right axillary nerve PNS           Recent Visits Date Type Provider Dept  04/28/21 Procedure visit Gillis Santa, MD [facility]  04/15/21 Office Visit Gillis Santa, MD [facility]  Showing recent visits within  past 90 days and meeting all other requirements Today's Visits Date Type Provider Dept  06/09/21 Office Visit Gillis Santa, MD [facility]  Showing today's visits and meeting all other requirements Future Appointments Date Type Provider Dept  07/15/21 Appointment Gillis Santa, MD [facility]  Showing future appointments within next 90 days and meeting all other requirements I discussed the assessment and treatment plan with the patient. The patient was provided an opportunity to ask questions and all were answered. The patient agreed with the plan and demonstrated an understanding of the instructions.  Patient advised to call back or seek an in-person evaluation if the symptoms or condition worsens.  Duration of encounter: 12mnutes.  Note by: BGillis Santa MD Date: 06/09/2021; Time: 3:26 PM

## 2021-06-21 DIAGNOSIS — Z01818 Encounter for other preprocedural examination: Secondary | ICD-10-CM | POA: Diagnosis not present

## 2021-06-21 DIAGNOSIS — J439 Emphysema, unspecified: Secondary | ICD-10-CM | POA: Diagnosis not present

## 2021-06-21 DIAGNOSIS — R0609 Other forms of dyspnea: Secondary | ICD-10-CM | POA: Diagnosis not present

## 2021-06-21 DIAGNOSIS — J849 Interstitial pulmonary disease, unspecified: Secondary | ICD-10-CM | POA: Diagnosis not present

## 2021-06-21 DIAGNOSIS — Z9981 Dependence on supplemental oxygen: Secondary | ICD-10-CM | POA: Diagnosis not present

## 2021-06-22 DIAGNOSIS — E785 Hyperlipidemia, unspecified: Secondary | ICD-10-CM | POA: Diagnosis not present

## 2021-06-22 DIAGNOSIS — I519 Heart disease, unspecified: Secondary | ICD-10-CM | POA: Diagnosis not present

## 2021-06-22 DIAGNOSIS — I5020 Unspecified systolic (congestive) heart failure: Secondary | ICD-10-CM | POA: Diagnosis not present

## 2021-06-22 DIAGNOSIS — J439 Emphysema, unspecified: Secondary | ICD-10-CM | POA: Diagnosis not present

## 2021-06-22 DIAGNOSIS — I2510 Atherosclerotic heart disease of native coronary artery without angina pectoris: Secondary | ICD-10-CM | POA: Diagnosis not present

## 2021-06-22 DIAGNOSIS — I214 Non-ST elevation (NSTEMI) myocardial infarction: Secondary | ICD-10-CM | POA: Diagnosis not present

## 2021-06-22 DIAGNOSIS — I429 Cardiomyopathy, unspecified: Secondary | ICD-10-CM | POA: Diagnosis not present

## 2021-06-22 DIAGNOSIS — I739 Peripheral vascular disease, unspecified: Secondary | ICD-10-CM | POA: Diagnosis not present

## 2021-06-22 DIAGNOSIS — I9789 Other postprocedural complications and disorders of the circulatory system, not elsewhere classified: Secondary | ICD-10-CM | POA: Diagnosis not present

## 2021-07-15 ENCOUNTER — Other Ambulatory Visit: Payer: Self-pay

## 2021-07-15 ENCOUNTER — Ambulatory Visit
Payer: Medicare HMO | Attending: Student in an Organized Health Care Education/Training Program | Admitting: Student in an Organized Health Care Education/Training Program

## 2021-07-15 ENCOUNTER — Encounter: Payer: Self-pay | Admitting: Student in an Organized Health Care Education/Training Program

## 2021-07-15 VITALS — BP 71/59 | HR 86 | Temp 97.1°F | Resp 18 | Ht 67.0 in | Wt 197.0 lb

## 2021-07-15 DIAGNOSIS — M25512 Pain in left shoulder: Secondary | ICD-10-CM

## 2021-07-15 DIAGNOSIS — M25511 Pain in right shoulder: Secondary | ICD-10-CM | POA: Insufficient documentation

## 2021-07-15 DIAGNOSIS — E1142 Type 2 diabetes mellitus with diabetic polyneuropathy: Secondary | ICD-10-CM | POA: Insufficient documentation

## 2021-07-15 DIAGNOSIS — G8929 Other chronic pain: Secondary | ICD-10-CM | POA: Insufficient documentation

## 2021-07-15 DIAGNOSIS — G894 Chronic pain syndrome: Secondary | ICD-10-CM | POA: Diagnosis not present

## 2021-07-15 DIAGNOSIS — M19011 Primary osteoarthritis, right shoulder: Secondary | ICD-10-CM | POA: Insufficient documentation

## 2021-07-15 DIAGNOSIS — I739 Peripheral vascular disease, unspecified: Secondary | ICD-10-CM | POA: Insufficient documentation

## 2021-07-15 DIAGNOSIS — I6522 Occlusion and stenosis of left carotid artery: Secondary | ICD-10-CM | POA: Diagnosis not present

## 2021-07-15 DIAGNOSIS — I509 Heart failure, unspecified: Secondary | ICD-10-CM | POA: Insufficient documentation

## 2021-07-15 DIAGNOSIS — E1140 Type 2 diabetes mellitus with diabetic neuropathy, unspecified: Secondary | ICD-10-CM | POA: Diagnosis not present

## 2021-07-15 DIAGNOSIS — I6523 Occlusion and stenosis of bilateral carotid arteries: Secondary | ICD-10-CM

## 2021-07-15 MED ORDER — HYDROCODONE-ACETAMINOPHEN 10-325 MG PO TABS: 1.0000 | ORAL_TABLET | Freq: Three times a day (TID) | ORAL | 0 refills | Status: DC | PRN

## 2021-07-15 MED ORDER — DICLOFENAC SODIUM 75 MG PO TBEC: 75.0000 mg | DELAYED_RELEASE_TABLET | Freq: Two times a day (BID) | ORAL | 1 refills | Status: AC

## 2021-07-15 NOTE — Progress Notes (Signed)
PROVIDER NOTE: Information contained herein reflects review and annotations entered in association with encounter. Interpretation of such information and data should be left to medically-trained personnel. Information provided to patient can be located elsewhere in the medical record under "Patient Instructions". Document created using STT-dictation technology, any transcriptional errors that may result from process are unintentional.    Patient: [name]  Service Category: E/M  Provider: Gillis Santa, MD  DOB: [birth year]  DOS: 07/15/2021  Specialty: Interventional Pain Management  MRN: [id number]  Setting: Ambulatory outpatient  PCP: John Harrier, MD  Type: Established Patient    Referring Provider: Tracie Harrier, MD  Location: Office  Delivery: Face-to-face     HPI  [name], a 60 y.o. year old male, is here today because of his Chronic pain syndrome [G89.4]. [name] primary complain today is Leg Pain (From above the knees, then legs hurt all over.) and Elbow Pain (bilateral)  Last encounter: My last encounter with him was on 06/09/21  Pertinent problems: [name] has PAD (peripheral artery disease) ([place]); Carotid stenosis; Carotid stenosis, symptomatic w/o infarct, left; CAD (coronary artery disease); Chronic right shoulder pain; S/P CABG (coronary artery bypass graft); DM (diabetes mellitus) ([place]); and Chronic pain syndrome on their pertinent problem list. Pain Assessment: Severity of Chronic pain is reported as a 10-Worst pain ever/10. Location: Leg Right, Left, Distal, Posterior/feet and toes. Onset: More than a month ago. Quality: Burning, Stabbing, Pressure (electrical shocks that burn). Timing: Constant. Modifying factor(s): hydrocodone, hot tub baths, topicals, ice heating pad. Vitals:  height is _0  (1.702 m) and weight is 197 lb (89.4 kg). His temporal temperature is 97.1 F (36.2 C) (abnormal). His blood pressure is 71/59 (abnormal) and his pulse is 86.  His respiration is 18 and oxygen saturation is 98%.   Reason for encounter: medication management.    [name] follows up today for medication management: Refill of his hydrocodone.  He also endorses bilateral knee pain related to knee osteoarthritis.  He is being followed by neurology for pulmonary fibrosis.  He does have mild shortness of breath. He did find benefit with Qutenza treatment but wants to defer treatment as his co-pay was $300. Recommend short course of diclofenac for increased bilateral knee pain.   Pharmacotherapy Assessment  Analgesic: Norco 10 mg 3 times daily as needed, quantity 90/month MME equals 30   Monitoring: [place] PMP: PDMP reviewed during this encounter.       Pharmacotherapy: No side-effects or adverse reactions reported. Compliance: No problems identified. Effectiveness: Clinically acceptable.  UDS:  Summary  Date Value Ref Range Status  10/20/2020 Note  Final    Comment:    ==================================================================== [employer] [facility] (MW) ==================================================================== Test                             Result       Flag       Units  Drug Present and Declared for Prescription Verification   Hydrocodone                    892          EXPECTED   ng/mg creat   Hydromorphone                  464          EXPECTED   ng/mg creat   Dihydrocodeine  139          EXPECTED   ng/mg creat   Norhydrocodone                 1136         EXPECTED   ng/mg creat    Sources of hydrocodone include scheduled prescription medications.    Hydromorphone, dihydrocodeine and norhydrocodone are expected    metabolites of hydrocodone. Hydromorphone and dihydrocodeine are    also available as scheduled prescription medications.  ==================================================================== Test                      Result    Flag   Units      Ref Range   Creatinine              36                mg/dL      >=20 ==================================================================== Declared Medications:  The flagging and interpretation on this report are based on the  following declared medications.  Unexpected results may arise from  inaccuracies in the declared medications.   **Note: The testing scope of this panel includes these medications:   Hydrocodone (Norco)   **Note: The testing scope of this panel does not include the  following reported medications:   Acetaminophen (Tylenol)  Acetaminophen (Norco)  Albuterol (Ventolin HFA)  Aspirin  Carvedilol (Coreg)  Furosemide (Lasix)  Metformin (Glucophage)  Pirfenidone (Esbriet)  Pravastatin (Pravachol)  Sodium Chloride  Spironolactone (Aldactone)  Tamsulosin (Flomax) ==================================================================== For clinical consultation, please call [phone]. ====================================================================       ROS  Constitutional: Denies any fever or chills Gastrointestinal: No reported hemesis, hematochezia, vomiting, or acute GI distress Musculoskeletal:  + right shoulder, bilateral knee pain Neurological:  Paresthesias of bilateral feet  Medication Review  HYDROcodone-acetaminophen, Pirfenidone, acetaminophen, albuterol, aspirin EC, buPROPion, carvedilol, diclofenac, furosemide, metFORMIN, pravastatin, sodium chloride, spironolactone, and tamsulosin  History Review  Allergy: [name] has No Known Allergies. Drug: [name]  reports no history of drug use. Alcohol:  reports current alcohol use. Tobacco:  reports that he quit smoking about 7 years ago. His smoking use included cigarettes. He has quit using smokeless tobacco. Social: [name]  reports that he quit smoking about 7 years ago. His smoking use included cigarettes. He has quit using smokeless tobacco. He reports current alcohol use. He reports that he does not use drugs. Medical:  has a past  medical history of Cancer ([place]), CHF (congestive heart failure) ([place]), COPD (chronic obstructive pulmonary disease) ([place]), Coronary artery disease, Coronary artery disease, Diabetes mellitus without complication ([place]), Dyspnea, Fibromyalgia, History of kidney stones, IPF (idiopathic pulmonary fibrosis) ([place]), MI (myocardial infarction) ([place]), Peripheral vascular disease ([place]), and Pneumonia. Surgical: [name]  has a past surgical history that includes Cardiac catheterization (N/A, 06/09/2015); Cardiac catheterization (06/09/2015); Coronary artery bypass graft (2015); Cardiac catheterization; Application if wound vac; Wound debridement; stent in leg (Left); Endarterectomy (Left, 09/08/2017); and Lower Extremity Angiography (Right, 01/29/2019). Family: family history includes Cancer in his father; Heart attack in his mother.  Laboratory Chemistry Profile   Renal Lab Results  Component Value Date   BUN 42 (H) 12/26/2020   CREATININE 1.70 (H) 12/26/2020   GFRAA >60 01/29/2019   GFRNONAA 45 (L) 12/26/2020     Hepatic Lab Results  Component Value Date   AST 21 08/17/2017   ALT 20 08/17/2017   ALBUMIN 3.7 08/17/2017   ALKPHOS 44 08/17/2017  Electrolytes Lab Results  Component Value Date   NA 133 (L) 12/26/2020   K 4.7 12/26/2020   CL 100 12/26/2020   CALCIUM 9.3 12/26/2020   MG 2.0 07/23/2018   PHOS 4.4 09/10/2017     Bone No results found for: VD25OH, [id number], [id number], RC7893YB0, 25OHVITD1, 25OHVITD2, 25OHVITD3, TESTOFREE, TESTOSTERONE   Inflammation (CRP: Acute Phase) (ESR: Chronic Phase) Lab Results  Component Value Date   LATICACIDVEN 1.4 09/09/2017       Note: Above Lab results reviewed.  Recent Imaging Review  CT Head Wo Contrast CLINICAL DATA:  Low blood pressure, weakness, headache, fatigue, and dizziness for 2 weeks, had medications adjusted a month ago  EXAM: CT HEAD WITHOUT CONTRAST  TECHNIQUE: Contiguous axial images were obtained from the base of the  skull through the vertex without intravenous contrast. Sagittal and coronal MPR images reconstructed from axial data set.  COMPARISON:  None  FINDINGS: Brain: Mild age-related atrophy. Normal ventricular morphology. No midline shift or mass effect. Otherwise normal appearance of brain parenchyma. No intracranial hemorrhage, mass lesion, or acute infarction. No extra-axial fluid collections.  Vascular: Atherosclerotic calcification of internal carotid arteries at skull base  Skull: Calvaria intact. Small lucencies are seen within the RIGHT sphenoid bone at the floor of the RIGHT middle cranial fossa anteriorly, appear benign, question arachnoid granulations  Sinuses/Orbits: Clear  Other: N/A  IMPRESSION: No acute intracranial abnormalities.  Electronically Signed   By: Lavonia Dana M.D.   On: 12/26/2020 17:24 DG Chest 2 View CLINICAL DATA:  Weakness, hypotension, CHF, dizziness, headaches, symptoms for 2 weeks  EXAM: CHEST - 2 VIEW  COMPARISON:  07/20/2018  FINDINGS: Normal heart size post CABG.  Mediastinal contours and pulmonary vascularity normal.  Lungs clear.  No pulmonary infiltrate, pleural effusion, or pneumothorax.  Scattered degenerative disc disease changes thoracic spine.  IMPRESSION: No acute abnormalities.  Electronically Signed   By: Lavonia Dana M.D.   On: 12/26/2020 16:31  Note: Reviewed        Physical Exam  General appearance: Well nourished, well developed, and well hydrated. In no apparent acute distress Mental status: Alert, oriented x 3 (person, place, & time)       Respiratory: No evidence of acute respiratory distress Eyes: PERLA Vitals: BP (!) 71/59 (BP Location: Right Arm, Patient Position: Sitting, Cuff Size: Large)    Pulse 86    Temp (!) 97.1 F (36.2 C) (Temporal)    Resp 18    Ht _0  (1.702 m)    Wt 197 lb (89.4 kg)    SpO2 98%    BMI 30.85 kg/m  BMI: Estimated body mass index is 30.85 kg/m as calculated from the  following:   Height as of this encounter: _1  (1.702 m).   Weight as of this encounter: 197 lb (89.4 kg). Ideal: Ideal body weight: 66.1 kg (145 lb 11.6 oz) Adjusted ideal body weight: 75.4 kg (166 lb 3.8 oz)    Cervical Spine Area Exam  Skin & Axial Inspection: No masses, redness, edema, swelling, or associated skin lesions Alignment: Symmetrical Functional ROM: Unrestricted ROM      Stability: No instability detected Muscle Tone/Strength: Functionally intact. No obvious neuro-muscular anomalies detected. Sensory (Neurological): Unimpaired   Upper Extremity (UE) Exam      Side: Right upper extremity   Side: Left upper extremity  Skin & Extremity Inspection: Skin color, temperature, and hair growth are WNL. No peripheral edema or cyanosis. No masses, redness, swelling, asymmetry, or associated skin lesions.  No contractures.   Skin & Extremity Inspection: Skin color, temperature, and hair growth are WNL. No peripheral edema or cyanosis. No masses, redness, swelling, asymmetry, or associated skin lesions. No contractures.  Functional ROM: Pain restricted ROM for shoulder, improved after axillary nerve stimulation   Functional ROM: Unrestricted ROM          Muscle Tone/Strength: Functionally intact. No obvious neuro-muscular anomalies detected.   Muscle Tone/Strength: Functionally intact. No obvious neuro-muscular anomalies detected.  Sensory (Neurological): Arthropathic arthralgia, improved after axillary nerve stimulation           Sensory (Neurological): Unimpaired          Palpation: No palpable anomalies               Palpation: No palpable anomalies              Provocative Test(s):  Phalen's test: deferred Tinel's test: deferred Apley's scratch test (touch opposite shoulder):  Action 1 (Across chest): Decreased ROM, improved after axillary nerve stimulation Action 2 (Overhead): Decreased ROM, improved after actually nerve stimulation Action 3 (LB reach): Decreased ROM, improved  after axillary stimulation     Provocative Test(s):  Phalen's test: deferred Tinel's test: deferred Apley's scratch test (touch opposite shoulder):  Action 1 (Across chest): deferred Action 2 (Overhead): deferred Action 3 (LB reach): deferred   Lower Extremity Exam    Side: Right lower extremity  Side: Left lower extremity  Stability: No instability observed          Stability: No instability observed          Skin & Extremity Inspection: Skin color, temperature, and hair growth are WNL. No peripheral edema or cyanosis. No masses, redness, swelling, asymmetry, or associated skin lesions. No contractures.  Skin & Extremity Inspection: Skin color, temperature, and hair growth are WNL. No peripheral edema or cyanosis. No masses, redness, swelling, asymmetry, or associated skin lesions. No contractures.  Functional ROM: Unrestricted ROM                  Functional ROM: Unrestricted ROM                  Muscle Tone/Strength: Functionally intact. No obvious neuro-muscular anomalies detected.  Muscle Tone/Strength: Functionally intact. No obvious neuro-muscular anomalies detected.  Sensory (Neurological): Neurogenic pain pattern      and arthropathic  Sensory (Neurological): Neurogenic pain pattern      and arthropathic  DTR: Patellar: deferred today Achilles: deferred today Plantar: deferred today  DTR: Patellar: deferred today Achilles: deferred today Plantar: deferred today  Palpation: No palpable anomalies  Palpation: No palpable anomalies    Assessment   Status Diagnosis  Worsening Worsening Worsening 1. Chronic pain syndrome   2. Chronic painful diabetic neuropathy ([place])   3. Chronic right shoulder pain   4. Primary osteoarthritis of right shoulder   5. Chronic pain of both shoulders   6. Congestive heart failure, unspecified HF chronicity, unspecified heart failure type ([place])   7. PAD (peripheral artery disease) ([place])   8. Carotid stenosis, symptomatic w/o infarct, left   9.  Type 2 diabetes mellitus with peripheral neuropathy ([facility])   10. Bilateral carotid artery stenosis       Plan of Care    [name] has a current medication list which includes the following long-term medication(s): albuterol, bupropion, carvedilol, furosemide, metformin, pravastatin, sodium chloride, [START ON 07/25/2021] hydrocodone-acetaminophen, [START ON 08/24/2021] hydrocodone-acetaminophen, and [START ON 09/23/2021] hydrocodone-acetaminophen.  Recommend repeat Qutenza treatment  for bilateral lower extremity painful diabetic neuropathy when patient cannot afford.  Patient has failed various neuropathic agents which resulted in cognitive side effects including gabapentin, Cymbalta, Lyrica. Diclofenac course as below for increased bilateral knee pain  Pharmacotherapy (Medications Ordered): Meds ordered this encounter  Medications   HYDROcodone-acetaminophen (NORCO) 10-325 MG tablet    Sig: Take 1 tablet by mouth every 8 (eight) hours as needed for severe pain. Must last 30 days.    Dispense:  90 tablet    Refill:  0    Chronic Pain. (STOP Act - Not applicable). Fill one day early if closed on scheduled refill date.   HYDROcodone-acetaminophen (NORCO) 10-325 MG tablet    Sig: Take 1 tablet by mouth every 8 (eight) hours as needed for severe pain. Must last 30 days.    Dispense:  90 tablet    Refill:  0    Chronic Pain. (STOP Act - Not applicable). Fill one day early if closed on scheduled refill date.   HYDROcodone-acetaminophen (NORCO) 10-325 MG tablet    Sig: Take 1 tablet by mouth every 8 (eight) hours as needed for severe pain. Must last 30 days.    Dispense:  90 tablet    Refill:  0    Chronic Pain. (STOP Act - Not applicable). Fill one day early if closed on scheduled refill date.   diclofenac (VOLTAREN) 75 MG EC tablet    Sig: Take 1 tablet (75 mg total) by mouth 2 (two) times daily.    Dispense:  30 tablet    Refill:  1   No orders of the defined types were placed in  this encounter.    Patient is tried gabapentin, Lyrica, Cymbalta which resulted in side effects including nausea, cognitive changes which the patient did not like.  Follow-up plan:   Return in about 3 months (around 10/13/2021) for Medication Management, in person.     s/p R GH shoulder joint injection 04/03/19, 08/26/19 (anterior approach), 03/23/2020- repeat prn; s/p right axillary nerve PNS         Recent Visits Date Type Provider Dept  06/09/21 Office Visit Gillis Santa, MD [facility]  04/28/21 Procedure visit Gillis Santa, MD [facility]  Showing recent visits within past 90 days and meeting all other requirements Today's Visits Date Type Provider Dept  07/15/21 Office Visit Gillis Santa, MD [facility]  Showing today's visits and meeting all other requirements Future Appointments No visits were found meeting these conditions. Showing future appointments within next 90 days and meeting all other requirements  I discussed the assessment and treatment plan with the patient. The patient was provided an opportunity to ask questions and all were answered. The patient agreed with the plan and demonstrated an understanding of the instructions.  Patient advised to call back or seek an in-person evaluation if the symptoms or condition worsens.  Duration of encounter:23mnutes.  Note by: BGillis Santa MD Date: 07/15/2021; Time: 11:32 AM

## 2021-07-15 NOTE — Progress Notes (Signed)
Nursing Pain Medication Assessment:  Safety precautions to be maintained throughout the outpatient stay will include: orient to surroundings, keep bed in low position, maintain call bell within reach at all times, provide assistance with transfer out of bed and ambulation.  Medication Inspection Compliance: Pill count conducted under aseptic conditions, in front of the patient. Neither the pills nor the bottle was removed from the patient's sight at any time. Once count was completed pills were immediately returned to the patient in their original bottle.  Medication: Hydrocodone/APAP Pill/Patch Count:  28 of 90 pills remain Pill/Patch Appearance: Markings consistent with prescribed medication Bottle Appearance: Standard pharmacy container. Clearly labeled. Filled Date: 80 / 02 / 2022 Last Medication intake:  Today

## 2021-07-29 DIAGNOSIS — R053 Chronic cough: Secondary | ICD-10-CM | POA: Diagnosis not present

## 2021-07-29 DIAGNOSIS — R0602 Shortness of breath: Secondary | ICD-10-CM | POA: Diagnosis not present

## 2021-07-29 DIAGNOSIS — J90 Pleural effusion, not elsewhere classified: Secondary | ICD-10-CM | POA: Diagnosis not present

## 2021-07-29 DIAGNOSIS — J189 Pneumonia, unspecified organism: Secondary | ICD-10-CM | POA: Diagnosis not present

## 2021-07-29 DIAGNOSIS — J209 Acute bronchitis, unspecified: Secondary | ICD-10-CM | POA: Diagnosis not present

## 2021-07-29 DIAGNOSIS — J441 Chronic obstructive pulmonary disease with (acute) exacerbation: Secondary | ICD-10-CM | POA: Diagnosis not present

## 2021-07-29 DIAGNOSIS — R059 Cough, unspecified: Secondary | ICD-10-CM | POA: Diagnosis not present

## 2021-08-09 ENCOUNTER — Ambulatory Visit: Payer: Medicare HMO | Admitting: Student in an Organized Health Care Education/Training Program

## 2021-09-23 ENCOUNTER — Other Ambulatory Visit: Payer: Self-pay | Admitting: Student in an Organized Health Care Education/Training Program

## 2021-10-05 DIAGNOSIS — J439 Emphysema, unspecified: Secondary | ICD-10-CM | POA: Diagnosis not present

## 2021-10-05 DIAGNOSIS — I9789 Other postprocedural complications and disorders of the circulatory system, not elsewhere classified: Secondary | ICD-10-CM | POA: Diagnosis not present

## 2021-10-05 DIAGNOSIS — I429 Cardiomyopathy, unspecified: Secondary | ICD-10-CM | POA: Diagnosis not present

## 2021-10-05 DIAGNOSIS — I2510 Atherosclerotic heart disease of native coronary artery without angina pectoris: Secondary | ICD-10-CM | POA: Diagnosis not present

## 2021-10-05 DIAGNOSIS — Z951 Presence of aortocoronary bypass graft: Secondary | ICD-10-CM | POA: Diagnosis not present

## 2021-10-05 DIAGNOSIS — I5020 Unspecified systolic (congestive) heart failure: Secondary | ICD-10-CM | POA: Diagnosis not present

## 2021-10-05 DIAGNOSIS — I214 Non-ST elevation (NSTEMI) myocardial infarction: Secondary | ICD-10-CM | POA: Diagnosis not present

## 2021-10-05 DIAGNOSIS — E1165 Type 2 diabetes mellitus with hyperglycemia: Secondary | ICD-10-CM | POA: Diagnosis not present

## 2021-10-05 DIAGNOSIS — I739 Peripheral vascular disease, unspecified: Secondary | ICD-10-CM | POA: Diagnosis not present

## 2021-10-06 DIAGNOSIS — Z951 Presence of aortocoronary bypass graft: Secondary | ICD-10-CM | POA: Diagnosis not present

## 2021-10-06 DIAGNOSIS — I429 Cardiomyopathy, unspecified: Secondary | ICD-10-CM | POA: Diagnosis not present

## 2021-10-06 DIAGNOSIS — F3340 Major depressive disorder, recurrent, in remission, unspecified: Secondary | ICD-10-CM | POA: Diagnosis not present

## 2021-10-06 DIAGNOSIS — J84112 Idiopathic pulmonary fibrosis: Secondary | ICD-10-CM | POA: Diagnosis not present

## 2021-10-06 DIAGNOSIS — I6522 Occlusion and stenosis of left carotid artery: Secondary | ICD-10-CM | POA: Diagnosis not present

## 2021-10-06 DIAGNOSIS — D649 Anemia, unspecified: Secondary | ICD-10-CM | POA: Diagnosis not present

## 2021-10-06 DIAGNOSIS — R5381 Other malaise: Secondary | ICD-10-CM | POA: Diagnosis not present

## 2021-10-06 DIAGNOSIS — J439 Emphysema, unspecified: Secondary | ICD-10-CM | POA: Diagnosis not present

## 2021-10-06 DIAGNOSIS — E1165 Type 2 diabetes mellitus with hyperglycemia: Secondary | ICD-10-CM | POA: Diagnosis not present

## 2021-10-06 DIAGNOSIS — I739 Peripheral vascular disease, unspecified: Secondary | ICD-10-CM | POA: Diagnosis not present

## 2021-10-06 IMAGING — CT CT ANGIO CHEST
2 of 6 series · 18 of 46 positions shown · IV contrast (APPLIED)
Comparison: 08/20/2018

CLINICAL DATA: Shortness of breath, history smoking, CHF,
hypertension, elevated D-dimer

EXAM:
CT ANGIOGRAPHY CHEST WITH CONTRAST
TECHNIQUE: Multidetector CT imaging of the chest was performed using the
standard protocol during bolus administration of intravenous
contrast. Multiplanar CT image reconstructions and MIPs were
obtained to evaluate the vascular anatomy.
CONTRAST:  75mL OMNIPAQUE IOHEXOL 350 MG/ML SOLN IV

[Series 5: thins · axial · 0.70mm/px · z∈[-543,-282]mm · 15 of 287 slices shown]
[im 13/287  lung]
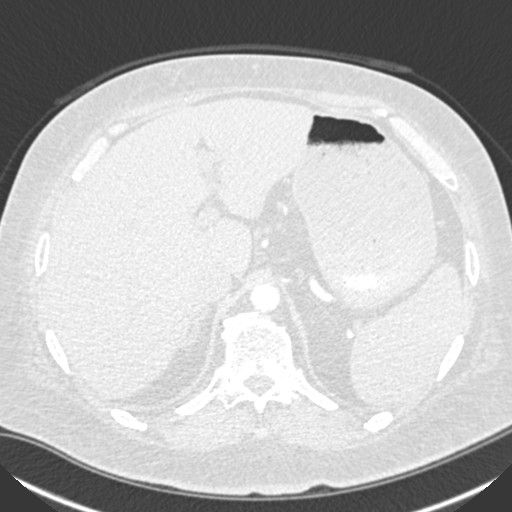
[im 38/287  soft-tissue]
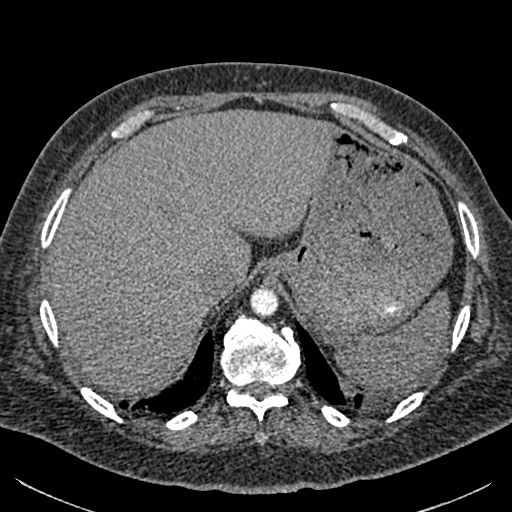
[im 50/287  lung]
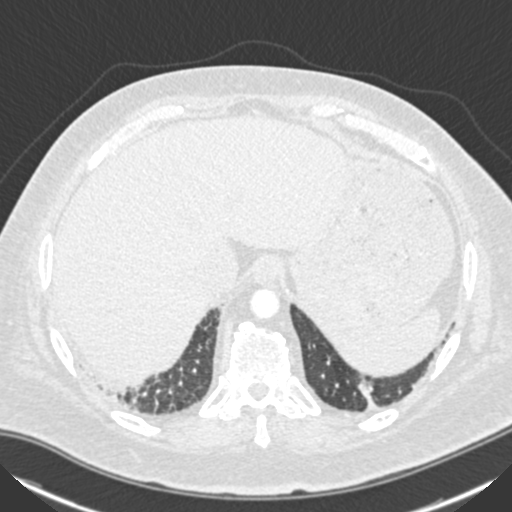
[im 75/287  soft-tissue]
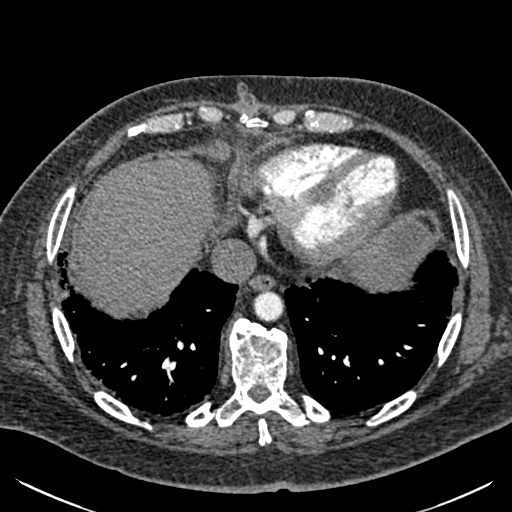
[im 88/287  lung]
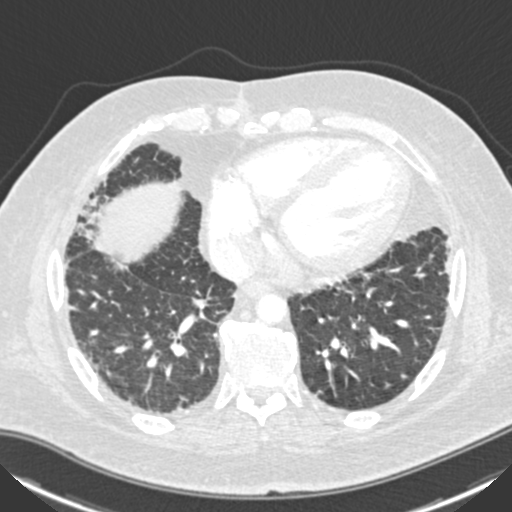
[im 112/287  soft-tissue]
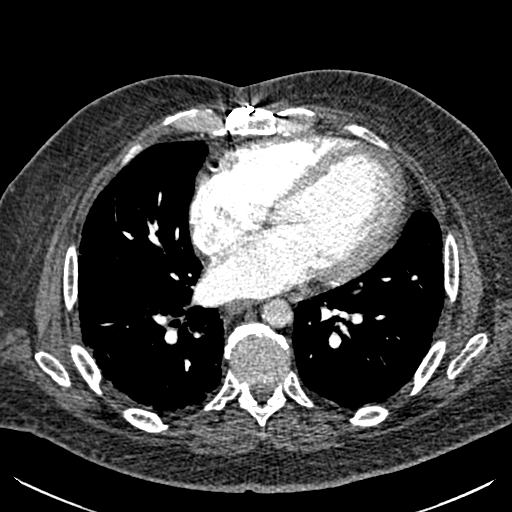
[im 125/287  lung]
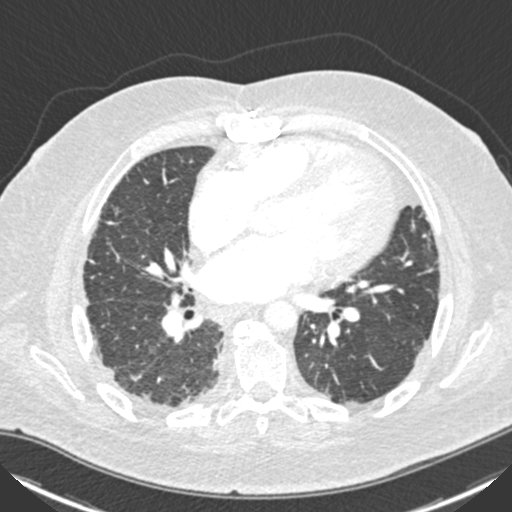
[im 150/287  soft-tissue]
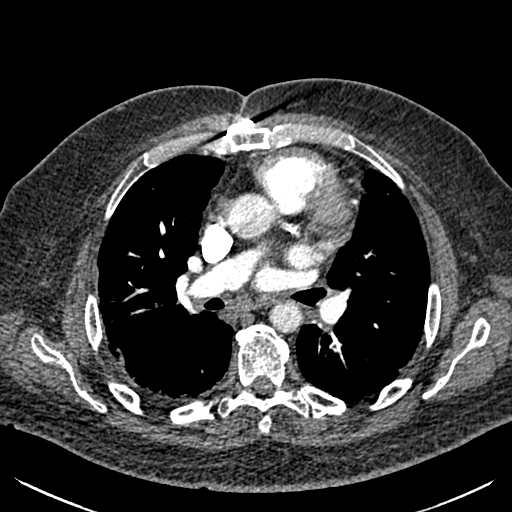
[im 162/287  lung]
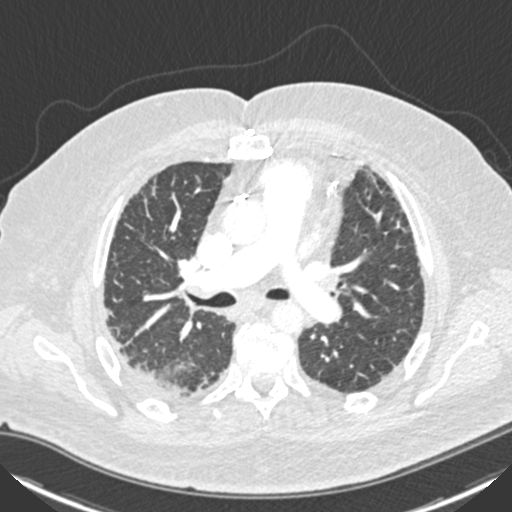
[im 175/287  soft-tissue]
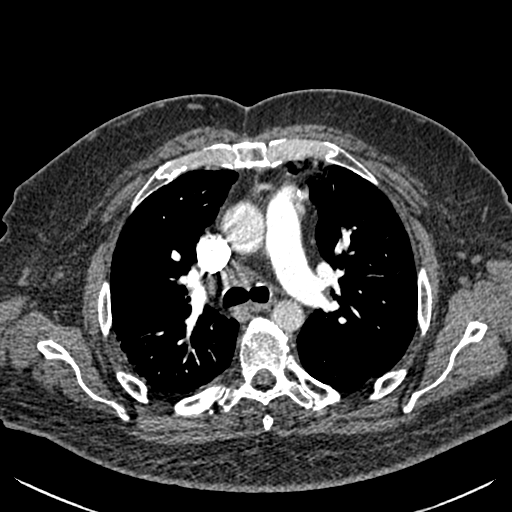
[im 199/287  lung]
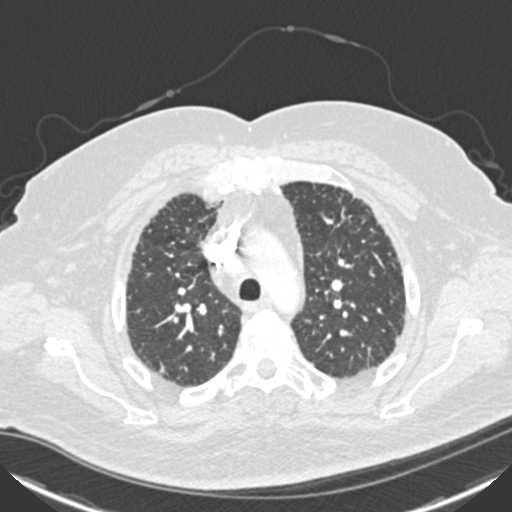
[im 212/287  soft-tissue]
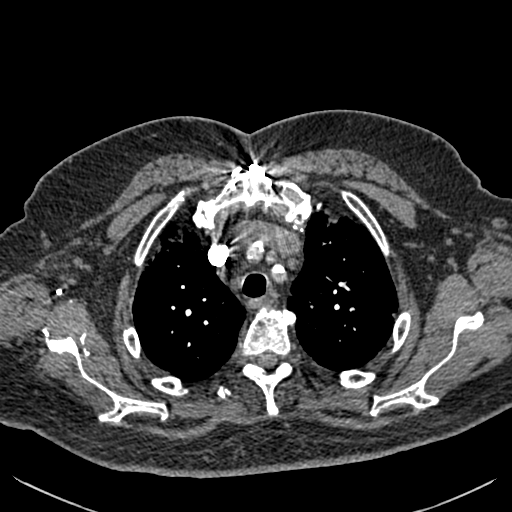
[im 237/287  lung]
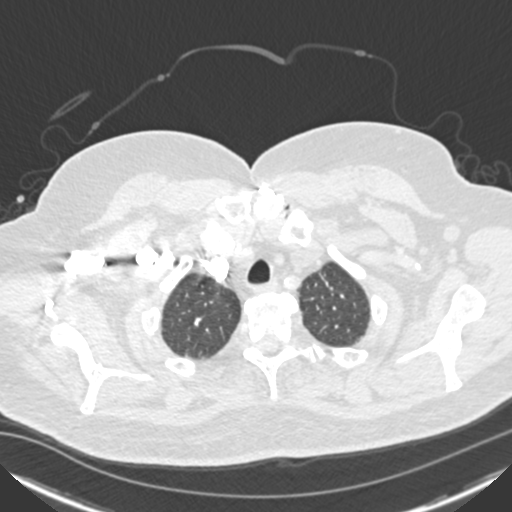
[im 249/287  soft-tissue]
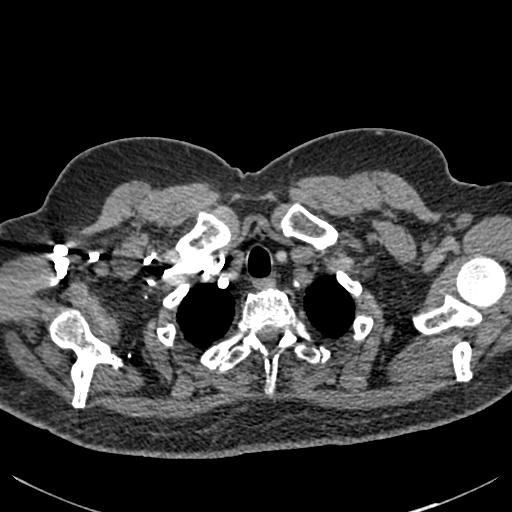
[im 274/287  lung]
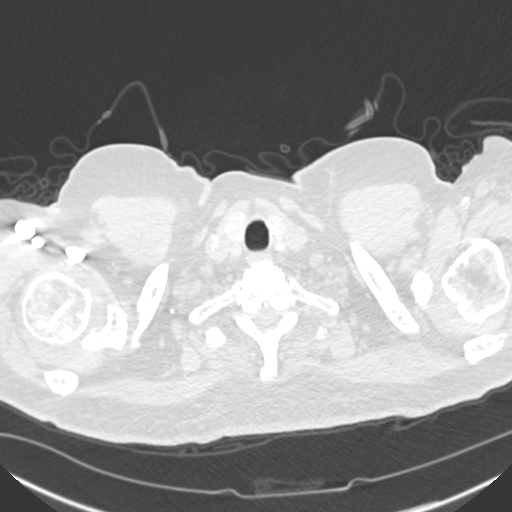

[Series 7: coronal mpr · coronal · 0.56mm/px · 3 of 103 slices shown]
[im 26/103  soft-tissue]
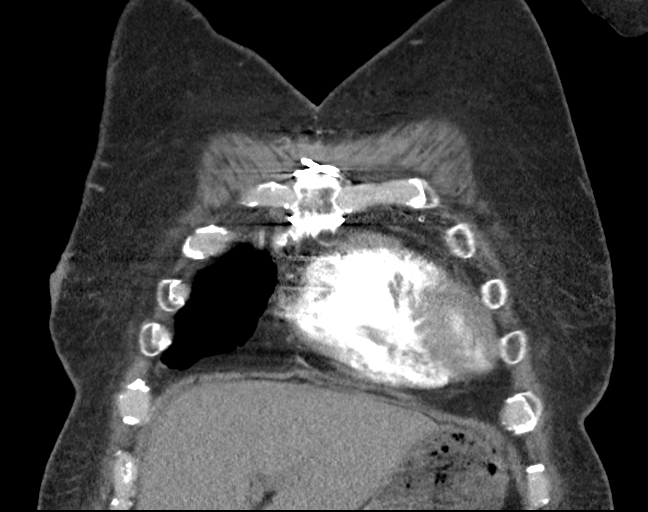
[im 52/103  soft-tissue]
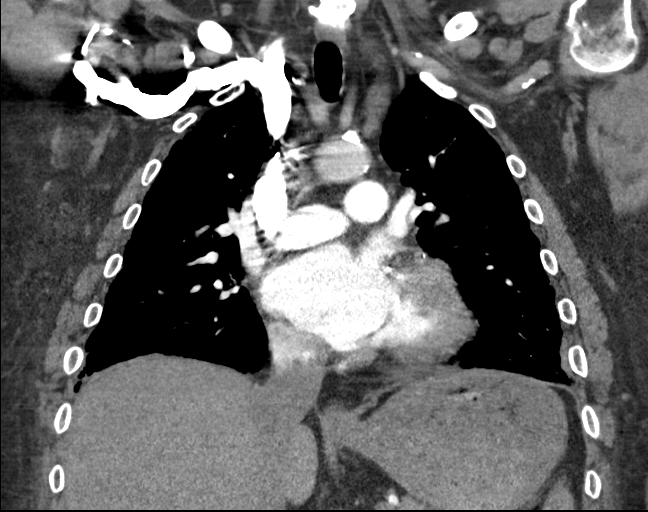
[im 77/103  soft-tissue]
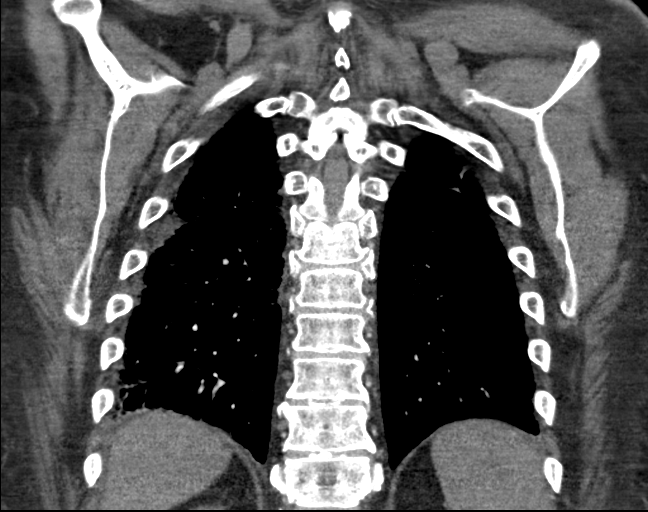

[18 of 46 positions shown; findings below may reference images not displayed]

FINDINGS: Cardiovascular: Atherosclerotic calcifications aorta, coronary
arteries, and proximal great vessels. Postsurgical changes of CABG.
Enlargement of cardiac chambers. No pericardial effusion. Aorta
normal caliber without aneurysm or dissection. Pulmonary arteries
well opacified and patent. No evidence of pulmonary embolism.

Mediastinum/Nodes: Base of cervical region normal appearance.
Enlarged precarinal lymph node 1.6 cm diameter, previously 1.7 cm.
Multiple additional RIGHT paratracheal nodes, normal in size. No
additional thoracic adenopathy. Esophagus unremarkable.

Lungs/Pleura: Diffuse interstitial thickening predominantly
peripheral, minimally greater at bases. Pattern similar to previous
exam. No acute consolidation, pleural effusion, or pneumothorax.

No definite pulmonary mass. Subsegmental atelectasis LEFT lower
lobe.

Upper Abdomen: Visualized upper abdomen unremarkable.

Musculoskeletal: No acute osseous findings.

Review of the MIP images confirms the above findings.
IMPRESSION: No evidence of pulmonary embolism.

Peripheral chronic interstitial lung disease changes.

Atherosclerotic calcifications aorta, proximal great vessels and
coronary arteries with postsurgical changes of CABG.

Stable nonspecific mildly enlarged precarinal lymph node.

Aortic Atherosclerosis ([id number]).

## 2021-10-13 DIAGNOSIS — J84112 Idiopathic pulmonary fibrosis: Secondary | ICD-10-CM | POA: Diagnosis not present

## 2021-10-13 DIAGNOSIS — J9611 Chronic respiratory failure with hypoxia: Secondary | ICD-10-CM | POA: Diagnosis not present

## 2021-10-13 DIAGNOSIS — I5020 Unspecified systolic (congestive) heart failure: Secondary | ICD-10-CM | POA: Diagnosis not present

## 2021-10-13 DIAGNOSIS — Z0000 Encounter for general adult medical examination without abnormal findings: Secondary | ICD-10-CM | POA: Diagnosis not present

## 2021-10-13 DIAGNOSIS — Z951 Presence of aortocoronary bypass graft: Secondary | ICD-10-CM | POA: Diagnosis not present

## 2021-10-13 DIAGNOSIS — Z1389 Encounter for screening for other disorder: Secondary | ICD-10-CM | POA: Diagnosis not present

## 2021-10-13 DIAGNOSIS — Z23 Encounter for immunization: Secondary | ICD-10-CM | POA: Diagnosis not present

## 2021-10-13 DIAGNOSIS — E1151 Type 2 diabetes mellitus with diabetic peripheral angiopathy without gangrene: Secondary | ICD-10-CM | POA: Diagnosis not present

## 2021-10-13 DIAGNOSIS — J449 Chronic obstructive pulmonary disease, unspecified: Secondary | ICD-10-CM | POA: Diagnosis not present

## 2021-10-14 ENCOUNTER — Ambulatory Visit
Payer: Medicare HMO | Attending: Student in an Organized Health Care Education/Training Program | Admitting: Student in an Organized Health Care Education/Training Program

## 2021-10-14 ENCOUNTER — Encounter: Payer: Self-pay | Admitting: Student in an Organized Health Care Education/Training Program

## 2021-10-14 ENCOUNTER — Other Ambulatory Visit: Payer: Self-pay

## 2021-10-14 VITALS — BP 114/68 | HR 83 | Temp 97.2°F | Resp 16 | Ht 67.0 in | Wt 190.0 lb

## 2021-10-14 DIAGNOSIS — M25512 Pain in left shoulder: Secondary | ICD-10-CM

## 2021-10-14 DIAGNOSIS — M19011 Primary osteoarthritis, right shoulder: Secondary | ICD-10-CM | POA: Diagnosis not present

## 2021-10-14 DIAGNOSIS — G894 Chronic pain syndrome: Secondary | ICD-10-CM | POA: Diagnosis not present

## 2021-10-14 DIAGNOSIS — M25511 Pain in right shoulder: Secondary | ICD-10-CM | POA: Diagnosis not present

## 2021-10-14 DIAGNOSIS — G8929 Other chronic pain: Secondary | ICD-10-CM

## 2021-10-14 DIAGNOSIS — I509 Heart failure, unspecified: Secondary | ICD-10-CM | POA: Diagnosis not present

## 2021-10-14 DIAGNOSIS — I739 Peripheral vascular disease, unspecified: Secondary | ICD-10-CM | POA: Diagnosis not present

## 2021-10-14 DIAGNOSIS — E1140 Type 2 diabetes mellitus with diabetic neuropathy, unspecified: Secondary | ICD-10-CM | POA: Diagnosis not present

## 2021-10-14 MED ORDER — HYDROCODONE-ACETAMINOPHEN 10-325 MG PO TABS: 1.0000 | ORAL_TABLET | Freq: Three times a day (TID) | ORAL | 0 refills | Status: DC | PRN

## 2021-10-14 NOTE — Progress Notes (Signed)
PROVIDER NOTE: Information contained herein reflects review and annotations entered in association with encounter. Interpretation of such information and data should be left to medically-trained personnel. Information provided to patient can be located elsewhere in the medical record under "Patient Instructions". Document created using STT-dictation technology, any transcriptional errors that may result from process are unintentional.  ?  ?Patient: [name]  Service Category: E/M  Provider: Gillis Santa, MD  ?DOB: 06-Jul-1961  DOS: 10/14/2021  Specialty: Interventional Pain Management  ?MRN: [id number]  Setting: Ambulatory outpatient  PCP: John Harrier, MD  ?Type: Established Patient    Referring Provider: Tracie Harrier, MD  ?Location: Office  Delivery: Face-to-face    ? ?HPI  ?[name], a 61 y.o. year old male, is here today because of his Chronic pain syndrome [G89.4]. [name] primary complain today is Shoulder Pain (bilateral), Leg Pain (bilateral), and Joint Pain ? ?Last encounter: My last encounter with him was on 07/15/21 ? ? ?Pertinent problems: [name] has PAD (peripheral artery disease) ([place]); Carotid stenosis; Carotid stenosis, symptomatic w/o infarct, left; CAD (coronary artery disease); Chronic right shoulder pain; S/P CABG (coronary artery bypass graft); DM (diabetes mellitus) ([place]); and Chronic pain syndrome on their pertinent problem list. ?Pain Assessment: Severity of Chronic pain is reported as a 8 /10. Location: Leg Right, Left/ . Onset: More than a month ago. Quality: Sharp, Aching, Nagging. Timing: Constant. Modifying factor(s): Hydrocodone. ?Vitals:  height is _0  (1.702 m) and weight is 190 lb (86.2 kg). His temporal temperature is 97.2 ?F (36.2 ?C) (abnormal). His blood pressure is 114/68 and his pulse is 83. His respiration is 16 and oxygen saturation is 98%.  ? ?Reason for encounter: medication management.   ? ?[name] follows up today for medication  management: Refill of his hydrocodone. ?He did have pneumonia for which she was prescribed antibiotics.  He does have a history of interstitial pulmonary fibrosis being managed by Dr. Raul [name] with pulmonology. ?Note patient has tried and failed gabapentin, Lyrica, Cymbalta, amitriptyline past.  Do not recommend any further dose escalation of his opioid therapy. ? ?Pharmacotherapy Assessment  ?Analgesic: Norco 10 mg 3 times daily as needed, quantity 90/month MME equals 30  ? ?Monitoring: ?[name] PMP: PDMP reviewed during this encounter.       ?Pharmacotherapy: No side-effects or adverse reactions reported. ?Compliance: No problems identified. ?Effectiveness: Clinically acceptable.  UDS:  ?Summary  ?Date Value Ref Range Status  ?10/20/2020 Note  Final  ?  Comment:  ?  ==================================================================== ?[employer] Select 13 (MW) ?==================================================================== ?Test                             Result       Flag       Units ? ?Drug Present and Declared for Prescription Verification ?  Hydrocodone                    892          EXPECTED   ng/mg creat ?  Hydromorphone                  464          EXPECTED   ng/mg creat ?  Dihydrocodeine                 139          EXPECTED   ng/mg creat ?  Norhydrocodone  1136         EXPECTED   ng/mg creat ?   Sources of hydrocodone include scheduled prescription medications. ?   Hydromorphone, dihydrocodeine and norhydrocodone are expected ?   metabolites of hydrocodone. Hydromorphone and dihydrocodeine are ?   also available as scheduled prescription medications. ? ?==================================================================== ?Test                      Result    Flag   Units      Ref Range ?  Creatinine              36               mg/dL      >=20 ?==================================================================== ?Declared Medications: ? The flagging and interpretation on this report are  based on the ? following declared medications.  Unexpected results may arise from ? inaccuracies in the declared medications. ? ? **Note: The testing scope of this panel includes these medications: ? ? Hydrocodone (Norco) ? ? **Note: The testing scope of this panel does not include the ? following reported medications: ? ? Acetaminophen (Tylenol) ? Acetaminophen (Norco) ? Albuterol (Ventolin HFA) ? Aspirin ? Carvedilol (Coreg) ? Furosemide (Lasix) ? Metformin (Glucophage) ? Pirfenidone (Esbriet) ? Pravastatin (Pravachol) ? Sodium Chloride ? Spironolactone (Aldactone) ? Tamsulosin (Flomax) ?==================================================================== ?For clinical consultation, please call [phone]. ?==================================================================== ?  ?  ? ? ?ROS  ?Constitutional: Denies any fever or chills ?Gastrointestinal: No reported hemesis, hematochezia, vomiting, or acute GI distress ?Musculoskeletal:  + bilateral knee pain ?Neurological:  Paresthesias of bilateral feet ? ?Medication Review  ?HYDROcodone-acetaminophen, Pirfenidone, acetaminophen, albuterol, aspirin EC, buPROPion, carvedilol, furosemide, metFORMIN, pravastatin, sodium chloride, spironolactone, and tamsulosin ? ?History Review  ?Allergy: [name] has No Known Allergies. ?Drug: [name]  reports no history of drug use. ?Alcohol:  reports current alcohol use. ?Tobacco:  reports that he quit smoking about 7 years ago. His smoking use included cigarettes. He has quit using smokeless tobacco. ?Social: [name]  reports that he quit smoking about 7 years ago. His smoking use included cigarettes. He has quit using smokeless tobacco. He reports current alcohol use. He reports that he does not use drugs. ?Medical:  has a past medical history of Cancer ([place]), CHF (congestive heart failure) ([place]), COPD (chronic obstructive pulmonary disease) ([place]), Coronary artery disease, Coronary artery disease, Diabetes mellitus  without complication ([place]), Dyspnea, Fibromyalgia, History of kidney stones, IPF (idiopathic pulmonary fibrosis) ([place]), MI (myocardial infarction) ([place]), Peripheral vascular disease ([place]), and Pneumonia. ?Surgical: Mr. Monday  has a past surgical history that includes Cardiac catheterization (N/A, 06/09/2015); Cardiac catheterization (06/09/2015); Coronary artery bypass graft (2015); Cardiac catheterization; Application if wound vac; Wound debridement; stent in leg (Left); Endarterectomy (Left, 09/08/2017); and Lower Extremity Angiography (Right, 01/29/2019). ?Family: family history includes Cancer in his father; Heart attack in his mother. ? ?Laboratory Chemistry Profile  ? ?Renal ?Lab Results  ?Component Value Date  ? BUN 42 (H) 12/26/2020  ? CREATININE 1.70 (H) 12/26/2020  ? GFRAA >60 01/29/2019  ? GFRNONAA 45 (L) 12/26/2020  ? ?  Hepatic ?Lab Results  ?Component Value Date  ? AST 21 08/17/2017  ? ALT 20 08/17/2017  ? ALBUMIN 3.7 08/17/2017  ? ALKPHOS 44 08/17/2017  ? ?  ?Electrolytes ?Lab Results  ?Component Value Date  ? NA 133 (L) 12/26/2020  ? K 4.7 12/26/2020  ? CL 100 12/26/2020  ? CALCIUM 9.3 12/26/2020  ? MG 2.0 07/23/2018  ? PHOS  4.4 09/10/2017  ? ?  Bone ?No results found for: [place], [id number], [id number], [id number], 25OHVITD1, 25OHVITD2, 25OHVITD3, TESTOFREE, TESTOSTERONE ?  ?Inflammation (CRP: Acute Phase) (ESR: Chronic Phase) ?Lab Results  ?Component Value Date  ? LATICACIDVEN 1.4 09/09/2017  ? ?    ?Note: Above Lab results reviewed. ? ?Recent Imaging Review  ?CT Head Wo Contrast ?CLINICAL DATA:  Low blood pressure, weakness, headache, fatigue, and ?dizziness for 2 weeks, had medications adjusted a month ago ? ?EXAM: ?CT HEAD WITHOUT CONTRAST ? ?TECHNIQUE: ?Contiguous axial images were obtained from the base of the skull ?through the vertex without intravenous contrast. Sagittal and ?coronal MPR images reconstructed from axial data set. ? ?COMPARISON:  None ? ?FINDINGS: ?Brain: Mild age-related  atrophy. Normal ventricular morphology. No ?midline shift or mass effect. Otherwise normal appearance of brain ?parenchyma. No intracranial hemorrhage, mass lesion, or acute ?infarction. No extra-axial fluid collections.

## 2021-10-14 NOTE — Progress Notes (Signed)
Nursing Pain Medication Assessment:  ?Safety precautions to be maintained throughout the outpatient stay will include: orient to surroundings, keep bed in low position, maintain call bell within reach at all times, provide assistance with transfer out of bed and ambulation.  ?Medication Inspection Compliance: Pill count conducted under aseptic conditions, in front of the patient. Neither the pills nor the bottle was removed from the patient's sight at any time. Once count was completed pills were immediately returned to the patient in their original bottle. ? ?Medication: Hydrocodone/APAP ?Pill/Patch Count:  27 of 90 pills remain ?Pill/Patch Appearance: Markings consistent with prescribed medication ?Bottle Appearance: Standard pharmacy container. Clearly labeled. ?Filled Date: 03 / 02 / 2023 ?Last Medication intake:  Today ?

## 2021-10-20 LAB — TOXASSURE SELECT 13 (MW), URINE

## 2021-11-29 ENCOUNTER — Ambulatory Visit (INDEPENDENT_AMBULATORY_CARE_PROVIDER_SITE_OTHER): Payer: Medicare HMO | Admitting: Vascular Surgery

## 2021-11-29 ENCOUNTER — Encounter (INDEPENDENT_AMBULATORY_CARE_PROVIDER_SITE_OTHER): Payer: Medicare HMO

## 2021-11-29 DIAGNOSIS — J84112 Idiopathic pulmonary fibrosis: Secondary | ICD-10-CM | POA: Diagnosis not present

## 2021-11-29 DIAGNOSIS — J449 Chronic obstructive pulmonary disease, unspecified: Secondary | ICD-10-CM | POA: Diagnosis not present

## 2021-11-29 DIAGNOSIS — R0609 Other forms of dyspnea: Secondary | ICD-10-CM | POA: Diagnosis not present

## 2021-12-13 ENCOUNTER — Ambulatory Visit (INDEPENDENT_AMBULATORY_CARE_PROVIDER_SITE_OTHER): Payer: Medicare HMO

## 2021-12-13 ENCOUNTER — Other Ambulatory Visit (INDEPENDENT_AMBULATORY_CARE_PROVIDER_SITE_OTHER): Payer: Self-pay | Admitting: Vascular Surgery

## 2021-12-13 ENCOUNTER — Ambulatory Visit (INDEPENDENT_AMBULATORY_CARE_PROVIDER_SITE_OTHER): Payer: Medicare HMO | Admitting: Vascular Surgery

## 2021-12-13 DIAGNOSIS — I6523 Occlusion and stenosis of bilateral carotid arteries: Secondary | ICD-10-CM

## 2021-12-13 DIAGNOSIS — I739 Peripheral vascular disease, unspecified: Secondary | ICD-10-CM

## 2021-12-15 ENCOUNTER — Encounter (INDEPENDENT_AMBULATORY_CARE_PROVIDER_SITE_OTHER): Payer: Self-pay | Admitting: *Deleted

## 2022-01-11 ENCOUNTER — Encounter: Payer: Medicare HMO | Admitting: Student in an Organized Health Care Education/Training Program

## 2022-01-20 ENCOUNTER — Ambulatory Visit
Payer: Medicare HMO | Attending: Student in an Organized Health Care Education/Training Program | Admitting: Student in an Organized Health Care Education/Training Program

## 2022-01-20 ENCOUNTER — Encounter: Payer: Self-pay | Admitting: Student in an Organized Health Care Education/Training Program

## 2022-01-20 VITALS — BP 120/77 | HR 77 | Temp 97.2°F | Resp 16 | Ht 67.0 in | Wt 195.0 lb

## 2022-01-20 DIAGNOSIS — M25512 Pain in left shoulder: Secondary | ICD-10-CM | POA: Diagnosis not present

## 2022-01-20 DIAGNOSIS — G8929 Other chronic pain: Secondary | ICD-10-CM | POA: Diagnosis not present

## 2022-01-20 DIAGNOSIS — I509 Heart failure, unspecified: Secondary | ICD-10-CM | POA: Diagnosis not present

## 2022-01-20 DIAGNOSIS — I739 Peripheral vascular disease, unspecified: Secondary | ICD-10-CM | POA: Insufficient documentation

## 2022-01-20 DIAGNOSIS — G894 Chronic pain syndrome: Secondary | ICD-10-CM | POA: Insufficient documentation

## 2022-01-20 DIAGNOSIS — M19011 Primary osteoarthritis, right shoulder: Secondary | ICD-10-CM | POA: Insufficient documentation

## 2022-01-20 DIAGNOSIS — E1140 Type 2 diabetes mellitus with diabetic neuropathy, unspecified: Secondary | ICD-10-CM | POA: Insufficient documentation

## 2022-01-20 DIAGNOSIS — M25511 Pain in right shoulder: Secondary | ICD-10-CM | POA: Diagnosis not present

## 2022-01-20 MED ORDER — HYDROCODONE-ACETAMINOPHEN 10-325 MG PO TABS: 1.0000 | ORAL_TABLET | Freq: Three times a day (TID) | ORAL | 0 refills | Status: DC | PRN

## 2022-01-20 NOTE — Progress Notes (Signed)
Nursing Pain Medication Assessment:  Safety precautions to be maintained throughout the outpatient stay will include: orient to surroundings, keep bed in low position, maintain call bell within reach at all times, provide assistance with transfer out of bed and ambulation.  Medication Inspection Compliance: Pill count conducted under aseptic conditions, in front of the patient. Neither the pills nor the bottle was removed from the patient's sight at any time. Once count was completed pills were immediately returned to the patient in their original bottle.  Medication: Hydrocodone/APAP Pill/Patch Count:  3 of 90 pills remain Pill/Patch Appearance: Markings consistent with prescribed medication Bottle Appearance: Standard pharmacy container. Clearly labeled. Filled Date: 05 / 31 / 2023 Last Medication intake:  Today

## 2022-01-20 NOTE — Progress Notes (Signed)
PROVIDER NOTE: Information contained herein reflects review and annotations entered in association with encounter. Interpretation of such information and data should be left to medically-trained personnel. Information provided to patient can be located elsewhere in the medical record under "Patient Instructions". Document created using STT-dictation technology, any transcriptional errors that may result from process are unintentional.    Patient: [name]  Service Category: E/M  Provider: Gillis Santa, MD  DOB: 25-Jul-1961  DOS: 01/20/2022  Specialty: Interventional Pain Management  MRN: [id number]  Setting: Ambulatory outpatient  PCP: Tracie Harrier, MD  Type: Established Patient    Referring Provider: Tracie Harrier, MD  Location: Office  Delivery: Face-to-face     HPI  [name], a 61 y.o. year old male, is here today because of his Chronic painful diabetic neuropathy ([place]) [E11.40]. [name] primary complain today is Knee Pain (bilateral), Leg Pain (bilateral), and Foot Pain (bilateral)  Last encounter: My last encounter with him was on 10/14/21   Pertinent problems: [name] has PAD (peripheral artery disease) ([place]); Carotid stenosis; Carotid stenosis, symptomatic w/o infarct, left; CAD (coronary artery disease); Chronic right shoulder pain; S/P CABG (coronary artery bypass graft); DM (diabetes mellitus) ([place]); and Chronic pain syndrome on their pertinent problem list. Pain Assessment: Severity of Chronic pain is reported as a 8 /10. Location: Knee Right, Left/sometimes. Onset: More than a month ago. Quality: Burning, Sharp. Timing: Intermittent. Modifying factor(s): meds. Vitals:  height is '5\' 7"'  (1.702 m) and weight is 195 lb (88.5 kg). His temperature is 97.2 F (36.2 C) (abnormal). His blood pressure is 120/77 and his pulse is 77. His respiration is 16 and oxygen saturation is 97%.   Reason for encounter: medication management.    [name] follows up today for  medication management: Refill of his hydrocodone. He did find benefit with Qutenza treatment for PDN done in October. He endorses a reduction in his LE parasthesias of bilateral feet and he would like to repeat as it has worn off. Note patient has tried and failed gabapentin, Lyrica, Cymbalta, amitriptyline past.  Do not recommend any further dose escalation of his opioid therapy.  Pharmacotherapy Assessment  Analgesic: Norco 10 mg 3 times daily as needed, quantity 90/month MME equals 30   Monitoring: [place] PMP: PDMP reviewed during this encounter.       Pharmacotherapy: No side-effects or adverse reactions reported. Compliance: No problems identified. Effectiveness: Clinically acceptable.  UDS:  Summary  Date Value Ref Range Status  10/14/2021 Note  Final    Comment:    ==================================================================== [employer] Select 13 (MW) ==================================================================== Test                             Result       Flag       Units  Drug Present and Declared for Prescription Verification   Hydrocodone                    3404         EXPECTED   ng/mg creat   Hydromorphone                  981          EXPECTED   ng/mg creat   Dihydrocodeine                 405          EXPECTED   ng/mg creat  Norhydrocodone                 4328         EXPECTED   ng/mg creat    Sources of hydrocodone include scheduled prescription medications.    Hydromorphone, dihydrocodeine and norhydrocodone are expected    metabolites of hydrocodone. Hydromorphone and dihydrocodeine are    also available as scheduled prescription medications.  ==================================================================== Test                      Result    Flag   Units      Ref Range   Creatinine              57               mg/dL      >=20 ==================================================================== Declared Medications:  The flagging and  interpretation on this report are based on the  following declared medications.  Unexpected results may arise from  inaccuracies in the declared medications.   **Note: The testing scope of this panel includes these medications:   Hydrocodone (Norco)   **Note: The testing scope of this panel does not include the  following reported medications:   Acetaminophen (Tylenol)  Acetaminophen (Norco)  Albuterol (Ventolin HFA)  Aspirin  Bupropion (Wellbutrin)  Carvedilol (Coreg)  Furosemide (Lasix)  Metformin (Glucophage)  Pirfenidone (Esbriet)  Pravastatin (Pravachol)  Sodium Chloride  Spironolactone (Aldactone)  Tamsulosin (Flomax) ==================================================================== For clinical consultation, please call [phone]. ====================================================================       ROS  Constitutional: Denies any fever or chills Gastrointestinal: No reported hemesis, hematochezia, vomiting, or acute GI distress Musculoskeletal:  + bilateral knee pain Neurological:  Paresthesias of bilateral feet  Medication Review  HYDROcodone-acetaminophen, Pirfenidone, acetaminophen, albuterol, aspirin EC, buPROPion, carvedilol, furosemide, metFORMIN, pravastatin, sodium chloride, spironolactone, and tamsulosin  History Review  Allergy: [name] has No Known Allergies. Drug: [name]  reports no history of drug use. Alcohol:  reports current alcohol use. Tobacco:  reports that he quit smoking about 8 years ago. His smoking use included cigarettes. He has quit using smokeless tobacco. Social: [name]  reports that he quit smoking about 8 years ago. His smoking use included cigarettes. He has quit using smokeless tobacco. He reports current alcohol use. He reports that he does not use drugs. Medical:  has a past medical history of Cancer ([place]), CHF (congestive heart failure) ([place]), COPD (chronic obstructive pulmonary disease) ([place]), Coronary  artery disease, Coronary artery disease, Diabetes mellitus without complication ([place]), Dyspnea, Fibromyalgia, History of kidney stones, IPF (idiopathic pulmonary fibrosis) ([place]), MI (myocardial infarction) ([place]), Peripheral vascular disease ([place]), and Pneumonia. Surgical: Mr. Hawes  has a past surgical history that includes Cardiac catheterization (N/A, 06/09/2015); Cardiac catheterization (06/09/2015); Coronary artery bypass graft (2015); Cardiac catheterization; Application if wound vac; Wound debridement; stent in leg (Left); Endarterectomy (Left, 09/08/2017); and Lower Extremity Angiography (Right, 01/29/2019). Family: family history includes Cancer in his father; Heart attack in his mother.  Laboratory Chemistry Profile   Renal Lab Results  Component Value Date   BUN 42 (H) 12/26/2020   CREATININE 1.70 (H) 12/26/2020   GFRAA >60 01/29/2019   GFRNONAA 45 (L) 12/26/2020     Hepatic Lab Results  Component Value Date   AST 21 08/17/2017   ALT 20 08/17/2017   ALBUMIN 3.7 08/17/2017   ALKPHOS 44 08/17/2017     Electrolytes Lab Results  Component Value Date   NA 133 (L) 12/26/2020   K  4.7 12/26/2020   CL 100 12/26/2020   CALCIUM 9.3 12/26/2020   MG 2.0 07/23/2018   PHOS 4.4 09/10/2017     Bone No results found for: "VD25OH", "VD125OH2TOT", "OY7741OI7", "OM7672CN4", "25OHVITD1", "25OHVITD2", "25OHVITD3", "TESTOFREE", "TESTOSTERONE"   Inflammation (CRP: Acute Phase) (ESR: Chronic Phase) Lab Results  Component Value Date   LATICACIDVEN 1.4 09/09/2017       Note: Above Lab results reviewed.  Recent Imaging Review  VAS US CAROTID Carotid Arterial Duplex Study  Patient Name:  JONANTHONY NAHAR  Date of Exam:   12/13/2021 Medical Rec #: [id number]        Accession #:    [id number] Date of Birth: 1961/04/10        Patient Gender: M Patient Age:   61 years Exam Location:  [facility] Procedure:      VAS US CAROTID Referring Phys: Hortencia Pilar  --------------------------------------------------------------------------------   Indications: Carotid artery disease and left endarterectomy.  Performing Technologist: Concha Norway RVT    Examination Guidelines: A complete evaluation includes B-mode imaging, spectral Doppler, color Doppler, and power Doppler as needed of all accessible portions of each vessel. Bilateral testing is considered an integral part of a complete examination. Limited examinations for reoccurring indications may be performed as noted.    Right Carotid Findings: +----------+--------+--------+--------+------------------+--------+           PSV cm/sEDV cm/sStenosisPlaque DescriptionComments +----------+--------+--------+--------+------------------+--------+ CCA Prox  75      14                                         +----------+--------+--------+--------+------------------+--------+ CCA Mid   76      18                                         +----------+--------+--------+--------+------------------+--------+ CCA Distal70      13                                         +----------+--------+--------+--------+------------------+--------+ ICA Prox  155     33      40-59%  calcific                   +----------+--------+--------+--------+------------------+--------+ ICA Mid   117     26                                         +----------+--------+--------+--------+------------------+--------+ ICA Distal116     26                                         +----------+--------+--------+--------+------------------+--------+ ECA       161     13      >50%    calcific                   +----------+--------+--------+--------+------------------+--------+  +----------+--------+-------+----------------+-------------------+           PSV cm/sEDV cmsDescribe        Arm Pressure  (mmHG) +----------+--------+-------+----------------+-------------------+ [id number]  Multiphasic, WNL                    +----------+--------+-------+----------------+-------------------+  +---------+--------+--+--------+--+---------+ VertebralPSV cm/s93EDV cm/s15Antegrade +---------+--------+--+--------+--+---------+     Left Carotid Findings: +----------+--------+--------+--------+------------------+--------+           PSV cm/sEDV cm/sStenosisPlaque DescriptionComments +----------+--------+--------+--------+------------------+--------+ CCA Prox  85      20                                         +----------+--------+--------+--------+------------------+--------+ CCA Mid   79      23                                         +----------+--------+--------+--------+------------------+--------+ CCA Distal84      15                                         +----------+--------+--------+--------+------------------+--------+ ICA Prox  80      21                                         +----------+--------+--------+--------+------------------+--------+ ICA Mid   82      21                                         +----------+--------+--------+--------+------------------+--------+ ICA Distal92      28                                         +----------+--------+--------+--------+------------------+--------+  +----------+--------+--------+----------+-------------------+           PSV cm/sEDV cm/sDescribe  Arm Pressure (mmHG) +----------+--------+--------+----------+-------------------+ [id number]              monophasic                    +----------+--------+--------+----------+-------------------+  +---------+--------+--+--------+--------------+ VertebralPSV cm/s30EDV cm/sNot identified +---------+--------+--+--------+--------------+        Summary: Right Carotid: Velocities in the right  ICA are consistent with a 40-59%                stenosis. Non-hemodynamically significant plaque <50% noted in                the CCA. The ECA appears >50% stenosed.  Left Carotid: Velocities in the left ICA are consistent with a 1-39% stenosis.               Non-hemodynamically significant plaque <50% noted in the CCA. The               ECA appears <50% stenosed. Widely patent ICA s/p CEA.  Vertebrals:  Right vertebral artery demonstrates antegrade flow. Left vertebral              artery was not visualized. Subclavians: Left subclavian artery was stenotic. Normal flow hemodynamics were              seen in the right subclavian artery.  *  See table(s) above for measurements and observations.    Electronically signed by Hortencia Pilar MD on 12/16/2021 at 7:16:18 PM.      Final   VAS [place] ABI WITH/WO TBI  LOWER EXTREMITY DOPPLER STUDY  Patient Name:  [name]  Date of Exam:   12/13/2021 Medical Rec #: [id number]        Accession #:    [id number] Date of Birth: 1960-10-23        Patient Gender: M Patient Age:   89 years Exam Location:  [facility] Procedure:      VAS [place] ABI WITH/WO TBI Referring Phys:  --------------------------------------------------------------------------------   Indications: Peripheral artery disease.  Other Factors: 01/29/2019 right atherectomy. sfa-pop stent and pta of eia.  Vascular Interventions: 05/14/13: Right CIA & left EIA stents with right CFA                         PTAs;                         06/17/15: Right CFA PTA;                         01/29/2019 PTA and stent right SFA and popliteal artery.                         PTA right EIA.  Comparison Study: 11/2020  Performing Technologist: Concha Norway RVT    Examination Guidelines: A complete evaluation includes at minimum, Doppler waveform signals and systolic blood pressure reading at the level of bilateral brachial, anterior tibial, and posterior tibial arteries, when vessel  segments are accessible. Bilateral testing is considered an integral part of a complete examination. Photoelectric Plethysmograph (PPG) waveforms and toe systolic pressure readings are included as required and additional duplex testing as needed. Limited examinations for reoccurring indications may be performed as noted.    ABI Findings: +---------+------------------+-----+---------+--------+ Right    Rt Pressure (mmHg)IndexWaveform Comment  +---------+------------------+-----+---------+--------+ Brachial 97                                       +---------+------------------+-----+---------+--------+ ATA                             triphasicNonComp  +---------+------------------+-----+---------+--------+ PTA                             triphasicNonComp  +---------+------------------+-----+---------+--------+ [facility]               1.16 Normal            +---------+------------------+-----+---------+--------+  +---------+------------------+-----+--------+-------+ Left     Lt Pressure (mmHg)IndexWaveformComment +---------+------------------+-----+--------+-------+ Brachial 97                                     +---------+------------------+-----+--------+-------+ ATA                                     NonComp +---------+------------------+-----+--------+-------+ PTA      148  1.53         NonComp +---------+------------------+-----+--------+-------+ Great Toe106               1.09 Normal          +---------+------------------+-----+--------+-------+  +-------+-----------+-----------+------------+------------+ ABI/TBIToday's ABIToday's TBIPrevious ABIPrevious TBI +-------+-----------+-----------+------------+------------+ Right  NonComp    1.16       NonComp     .80          +-------+-----------+-----------+------------+------------+ Left   NonComp    1.06       1.12        .95           +-------+-----------+-----------+------------+------------+  Bilateral ABIs and TBIs appear essentially unchanged compared to prior study on 11/2020.   Summary: Right: Resting right ankle-brachial index indicates noncompressible right lower extremity arteries. The right toe-brachial index is normal.  Left: Resting left ankle-brachial index indicates noncompressible left lower extremity arteries. The left toe-brachial index is normal.  *See table(s) above for measurements and observations.    Electronically signed by Hortencia Pilar MD on 12/16/2021 at 7:08:34 PM.      Final    Note: Reviewed        Physical Exam  General appearance: Well nourished, well developed, and well hydrated. In no apparent acute distress Mental status: Alert, oriented x 3 (person, place, & time)       Respiratory: No evidence of acute respiratory distress Eyes: PERLA Vitals: BP 120/77   Pulse 77   Temp (!) 97.2 F (36.2 C)   Resp 16   Ht '5\' 7"'  (1.702 m)   Wt 195 lb (88.5 kg)   SpO2 97%   BMI 30.54 kg/m  BMI: Estimated body mass index is 30.54 kg/m as calculated from the following:   Height as of this encounter: '5\' 7"'  (1.702 m).   Weight as of this encounter: 195 lb (88.5 kg). Ideal: Ideal body weight: 66.1 kg (145 lb 11.6 oz) Adjusted ideal body weight: 75 kg (165 lb 6.9 oz)    Cervical Spine Area Exam  Skin & Axial Inspection: No masses, redness, edema, swelling, or associated skin lesions Alignment: Symmetrical Functional ROM: Unrestricted ROM      Stability: No instability detected Muscle Tone/Strength: Functionally intact. No obvious neuro-muscular anomalies detected. Sensory (Neurological): Unimpaired   Lower Extremity Exam    Side: Right lower extremity  Side: Left lower extremity  Stability: No instability observed          Stability: No instability observed          Skin & Extremity Inspection: Skin color, temperature, and hair growth are WNL. No peripheral edema or cyanosis. No  masses, redness, swelling, asymmetry, or associated skin lesions. No contractures.  Skin & Extremity Inspection: Skin color, temperature, and hair growth are WNL. No peripheral edema or cyanosis. No masses, redness, swelling, asymmetry, or associated skin lesions. No contractures.  Functional ROM: Unrestricted ROM                  Functional ROM: Unrestricted ROM                  Muscle Tone/Strength: Functionally intact. No obvious neuro-muscular anomalies detected.  Muscle Tone/Strength: Functionally intact. No obvious neuro-muscular anomalies detected.  Sensory (Neurological): Neurogenic pain pattern      and arthropathic  Sensory (Neurological): Neurogenic pain pattern      and arthropathic  DTR: Patellar: deferred today Achilles: deferred today Plantar: deferred today  DTR: Patellar: deferred today Achilles: deferred today Plantar:  deferred today  Palpation: No palpable anomalies  Palpation: No palpable anomalies    Assessment   Status Diagnosis  Worsening Worsening Worsening 1. Chronic painful diabetic neuropathy ([place])   2. Chronic right shoulder pain   3. Primary osteoarthritis of right shoulder   4. Congestive heart failure, unspecified HF chronicity, unspecified heart failure type ([place])   5. PAD (peripheral artery disease) ([place])   6. Chronic pain of both shoulders   7. Chronic pain syndrome       Plan of Care    Mr. MATTIS FEATHERLY has a current medication list which includes the following long-term medication(s): albuterol, carvedilol, furosemide, metformin, pravastatin, sodium chloride, bupropion, [START ON 01/21/2022] hydrocodone-acetaminophen, [START ON 02/20/2022] hydrocodone-acetaminophen, and [START ON 03/22/2022] hydrocodone-acetaminophen.  Recommend repeat Qutenza treatment for bilateral lower extremity painful diabetic neuropathy, will make sure we have insurance approval before scheduling.   Pharmacotherapy (Medications Ordered): Meds ordered this encounter   Medications   HYDROcodone-acetaminophen (NORCO) 10-325 MG tablet    Sig: Take 1 tablet by mouth every 8 (eight) hours as needed for severe pain. Must last 30 days.    Dispense:  90 tablet    Refill:  0    Chronic Pain. (STOP Act - Not applicable). Fill one day early if closed on scheduled refill date.   HYDROcodone-acetaminophen (NORCO) 10-325 MG tablet    Sig: Take 1 tablet by mouth every 8 (eight) hours as needed for severe pain. Must last 30 days.    Dispense:  90 tablet    Refill:  0    Chronic Pain. (STOP Act - Not applicable). Fill one day early if closed on scheduled refill date.   HYDROcodone-acetaminophen (NORCO) 10-325 MG tablet    Sig: Take 1 tablet by mouth every 8 (eight) hours as needed for severe pain. Must last 30 days.    Dispense:  90 tablet    Refill:  0    Chronic Pain. (STOP Act - Not applicable). Fill one day early if closed on scheduled refill date.   Orders Placed This Encounter  Procedures   NEUROLYSIS    Please order Qutenza patches from pharmacy    Standing Status:   Future    Standing Expiration Date:   04/22/2022    Order Specific Question:   Where will this procedure be performed?    Answer:   [facility]    Patient is tried gabapentin, Lyrica, Cymbalta which resulted in side effects including nausea, cognitive changes which the patient did not like.  Follow-up plan:   Return in about 4 weeks (around 02/17/2022) for Qutenza Rx(assuming insurance coverage).     s/p R GH shoulder joint injection 04/03/19, 08/26/19 (anterior approach), 03/23/2020- repeat prn; s/p right axillary nerve PNS         Recent Visits No visits were found meeting these conditions. Showing recent visits within past 90 days and meeting all other requirements Today's Visits Date Type Provider Dept  01/20/22 Office Visit Gillis Santa, MD [facility]  Showing today's visits and meeting all other requirements Future Appointments No visits were found meeting these  conditions. Showing future appointments within next 90 days and meeting all other requirements  I discussed the assessment and treatment plan with the patient. The patient was provided an opportunity to ask questions and all were answered. The patient agreed with the plan and demonstrated an understanding of the instructions.  Patient advised to call back or seek an in-person evaluation if the symptoms or condition  worsens.  Duration of encounter:73mnutes.  Note by: BGillis Santa MD Date: 01/20/2022; Time: 8:51 AM

## 2022-01-21 ENCOUNTER — Other Ambulatory Visit: Payer: Self-pay

## 2022-01-21 ENCOUNTER — Telehealth: Payer: Self-pay | Admitting: Student in an Organized Health Care Education/Training Program

## 2022-01-21 DIAGNOSIS — G8929 Other chronic pain: Secondary | ICD-10-CM

## 2022-01-21 DIAGNOSIS — G894 Chronic pain syndrome: Secondary | ICD-10-CM

## 2022-01-21 NOTE — Telephone Encounter (Signed)
Patient lvmail stating [facility] does not have his meds and can they all be sent to [facility] in [place]. I explained no phys here today, that we would send msg to Dr. Holley Raring.

## 2022-01-21 NOTE — Telephone Encounter (Signed)
Refill request sent to Dr Lateef.  

## 2022-01-24 ENCOUNTER — Telehealth: Payer: Self-pay | Admitting: Student in an Organized Health Care Education/Training Program

## 2022-01-24 ENCOUNTER — Other Ambulatory Visit: Payer: Self-pay | Admitting: *Deleted

## 2022-01-24 DIAGNOSIS — G8929 Other chronic pain: Secondary | ICD-10-CM

## 2022-01-24 DIAGNOSIS — L570 Actinic keratosis: Secondary | ICD-10-CM | POA: Diagnosis not present

## 2022-01-24 DIAGNOSIS — D485 Neoplasm of uncertain behavior of skin: Secondary | ICD-10-CM | POA: Diagnosis not present

## 2022-01-24 DIAGNOSIS — Z85828 Personal history of other malignant neoplasm of skin: Secondary | ICD-10-CM | POA: Diagnosis not present

## 2022-01-24 DIAGNOSIS — D2262 Melanocytic nevi of left upper limb, including shoulder: Secondary | ICD-10-CM | POA: Diagnosis not present

## 2022-01-24 DIAGNOSIS — G894 Chronic pain syndrome: Secondary | ICD-10-CM

## 2022-01-24 DIAGNOSIS — X32XXXA Exposure to sunlight, initial encounter: Secondary | ICD-10-CM | POA: Diagnosis not present

## 2022-01-24 DIAGNOSIS — D2271 Melanocytic nevi of right lower limb, including hip: Secondary | ICD-10-CM | POA: Diagnosis not present

## 2022-01-24 DIAGNOSIS — L280 Lichen simplex chronicus: Secondary | ICD-10-CM | POA: Diagnosis not present

## 2022-01-24 DIAGNOSIS — D2261 Melanocytic nevi of right upper limb, including shoulder: Secondary | ICD-10-CM | POA: Diagnosis not present

## 2022-01-24 MED ORDER — HYDROCODONE-ACETAMINOPHEN 10-325 MG PO TABS: 1.0000 | ORAL_TABLET | Freq: Three times a day (TID) | ORAL | 0 refills | Status: DC | PRN

## 2022-01-24 NOTE — Telephone Encounter (Signed)
Patient stated that his current pharmacy is still out of the meds that he need. He was able to get 30.Patient wants his refills to be send to [facility] in [place]. Please give patient a call. Thanks

## 2022-01-24 NOTE — Telephone Encounter (Signed)
Waiting for patient to let me know which pharmacy has his medication.

## 2022-01-24 NOTE — Telephone Encounter (Signed)
Patient states he got 30 tablets Hydrocodone on Friday. He needs to get a script sent to [facility] in [place] for 60. Please advise patient when this is done.

## 2022-01-24 NOTE — Telephone Encounter (Signed)
Message sent  to FN.

## 2022-02-01 DIAGNOSIS — I5020 Unspecified systolic (congestive) heart failure: Secondary | ICD-10-CM | POA: Diagnosis not present

## 2022-02-01 DIAGNOSIS — Z951 Presence of aortocoronary bypass graft: Secondary | ICD-10-CM | POA: Diagnosis not present

## 2022-02-01 DIAGNOSIS — J9611 Chronic respiratory failure with hypoxia: Secondary | ICD-10-CM | POA: Diagnosis not present

## 2022-02-01 DIAGNOSIS — I429 Cardiomyopathy, unspecified: Secondary | ICD-10-CM | POA: Diagnosis not present

## 2022-02-01 DIAGNOSIS — I9789 Other postprocedural complications and disorders of the circulatory system, not elsewhere classified: Secondary | ICD-10-CM | POA: Diagnosis not present

## 2022-02-01 DIAGNOSIS — I739 Peripheral vascular disease, unspecified: Secondary | ICD-10-CM | POA: Diagnosis not present

## 2022-02-01 DIAGNOSIS — I2510 Atherosclerotic heart disease of native coronary artery without angina pectoris: Secondary | ICD-10-CM | POA: Diagnosis not present

## 2022-02-01 DIAGNOSIS — I214 Non-ST elevation (NSTEMI) myocardial infarction: Secondary | ICD-10-CM | POA: Diagnosis not present

## 2022-02-01 DIAGNOSIS — J449 Chronic obstructive pulmonary disease, unspecified: Secondary | ICD-10-CM | POA: Diagnosis not present

## 2022-02-08 DIAGNOSIS — D649 Anemia, unspecified: Secondary | ICD-10-CM | POA: Diagnosis not present

## 2022-02-08 DIAGNOSIS — E1165 Type 2 diabetes mellitus with hyperglycemia: Secondary | ICD-10-CM | POA: Diagnosis not present

## 2022-02-08 DIAGNOSIS — Z125 Encounter for screening for malignant neoplasm of prostate: Secondary | ICD-10-CM | POA: Diagnosis not present

## 2022-02-15 DIAGNOSIS — I5020 Unspecified systolic (congestive) heart failure: Secondary | ICD-10-CM | POA: Diagnosis not present

## 2022-02-15 DIAGNOSIS — J449 Chronic obstructive pulmonary disease, unspecified: Secondary | ICD-10-CM | POA: Diagnosis not present

## 2022-02-15 DIAGNOSIS — E1151 Type 2 diabetes mellitus with diabetic peripheral angiopathy without gangrene: Secondary | ICD-10-CM | POA: Diagnosis not present

## 2022-02-15 DIAGNOSIS — Z0000 Encounter for general adult medical examination without abnormal findings: Secondary | ICD-10-CM | POA: Diagnosis not present

## 2022-02-15 DIAGNOSIS — J9610 Chronic respiratory failure, unspecified whether with hypoxia or hypercapnia: Secondary | ICD-10-CM | POA: Diagnosis not present

## 2022-02-15 DIAGNOSIS — Z87891 Personal history of nicotine dependence: Secondary | ICD-10-CM | POA: Diagnosis not present

## 2022-02-15 DIAGNOSIS — E1140 Type 2 diabetes mellitus with diabetic neuropathy, unspecified: Secondary | ICD-10-CM | POA: Diagnosis not present

## 2022-02-15 DIAGNOSIS — Z951 Presence of aortocoronary bypass graft: Secondary | ICD-10-CM | POA: Diagnosis not present

## 2022-02-15 DIAGNOSIS — D649 Anemia, unspecified: Secondary | ICD-10-CM | POA: Diagnosis not present

## 2022-02-16 ENCOUNTER — Ambulatory Visit
Payer: Medicare HMO | Attending: Student in an Organized Health Care Education/Training Program | Admitting: Student in an Organized Health Care Education/Training Program

## 2022-02-16 ENCOUNTER — Encounter: Payer: Self-pay | Admitting: Student in an Organized Health Care Education/Training Program

## 2022-02-16 DIAGNOSIS — E1140 Type 2 diabetes mellitus with diabetic neuropathy, unspecified: Secondary | ICD-10-CM | POA: Insufficient documentation

## 2022-02-16 MED ORDER — CAPSAICIN-CLEANSING GEL 8 % EX KIT
4.0000 | PACK | Freq: Once | CUTANEOUS | Status: AC
  Administered 2022-02-16: 4 via TOPICAL

## 2022-02-16 NOTE — Progress Notes (Signed)
Safety precautions to be maintained throughout the outpatient stay will include: orient to surroundings, keep bed in low position, maintain call bell within reach at all times, provide assistance with transfer out of bed and ambulation.  

## 2022-02-16 NOTE — Progress Notes (Signed)
PROVIDER NOTE: Information contained herein reflects review and annotations entered in association with encounter. Interpretation of such information and data should be left to medically-trained personnel. Information provided to patient can be located elsewhere in the medical record under "Patient Instructions". Document created using STT-dictation technology, any transcriptional errors that may result from process are unintentional.    Patient: [name]  Service Category: Procedure  Provider: Gillis Santa, MD  DOB: [birth year]  DOS: 02/16/2022  Location: [facility]  MRN: [id number]  Setting: Ambulatory - outpatient  Referring Provider: Gillis Santa, MD  Type: Established Patient  Specialty: Interventional Pain Management  PCP: Tracie Harrier, MD   Primary Reason for Visit: Interventional Pain Management Treatment. CC: Foot Pain (bilat)     Procedure:            Qutenza application to bilateral feet for painful diabetic neuropathy #2 (#1 done Oct 2022)     Indications: 1. Chronic painful diabetic neuropathy (HCC)    Pain Score: Pre-procedure: 8 /10 Post-procedure: 8 /10     Pre-op H&P Assessment:  [name] is a 61 y.o. (year old), male patient, seen today for interventional treatment. He  has a past surgical history that includes Cardiac catheterization (N/A, 06/09/2015); Cardiac catheterization (06/09/2015); Coronary artery bypass graft (2015); Cardiac catheterization; Application if wound vac; Wound debridement; stent in leg (Left); Endarterectomy (Left, 09/08/2017); and Lower Extremity Angiography (Right, 01/29/2019). [name] has a current medication list which includes the following prescription(s): acetaminophen, albuterol, aspirin ec, carvedilol, furosemide, [START ON 02/20/2022] hydrocodone-acetaminophen, [START ON 03/22/2022] hydrocodone-acetaminophen, hydrocodone-acetaminophen, metformin, esbriet, pravastatin, sodium chloride, spironolactone, tamsulosin, and  bupropion. His primarily concern today is the Foot Pain (bilat)   Initial Vital Signs:  Pulse/HCG Rate: 78  Temp: (!) 97.1 F (36.2 C) Resp: 16 BP: (!) 85/62 SpO2: 97 %  BMI: Estimated body mass index is 30.07 kg/m as calculated from the following:   Height as of 01/20/22: '5\' 7"'$  (1.702 m).   Weight as of this encounter: 192 lb (87.1 kg).  Risk Assessment: Allergies: Reviewed. He has No Known Allergies.  Allergy Precautions: None required Coagulopathies: Reviewed. None identified.  Blood-thinner therapy: None at this time Active Infection(s): Reviewed. None identified. [name] is afebrile  Site Confirmation: [name] was asked to confirm the procedure and laterality before marking the site Procedure checklist: Completed Consent: Before the procedure and under the influence of no sedative(s), amnesic(s), or anxiolytics, the patient was informed of the treatment options, risks and possible complications. To fulfill our ethical and legal obligations, as recommended by the American Medical Association's Code of Ethics, I have informed the patient of my clinical impression; the nature and purpose of the treatment or procedure; the risks, benefits, and possible complications of the intervention; the alternatives, including doing nothing; the risk(s) and benefit(s) of the alternative treatment(s) or procedure(s); and the risk(s) and benefit(s) of doing nothing. The patient was provided information about the general risks and possible complications associated with the procedure. These may include, but are not limited to: failure to achieve desired goals, infection, bleeding, organ or nerve damage, allergic reactions, paralysis, and death. In addition, the patient was informed of those risks and complications associated to the procedure, such as failure to decrease pain; infection; bleeding; organ or nerve damage with subsequent damage to sensory, motor, and/or autonomic systems, resulting in  permanent pain, numbness, and/or weakness of one or several areas of the body; allergic reactions; (i.e.: anaphylactic reaction); and/or death. Furthermore, the patient was informed  of those risks and complications associated with the medications. These include, but are not limited to: allergic reactions (i.e.: anaphylactic or anaphylactoid reaction(s)); adrenal axis suppression; blood sugar elevation that in diabetics may result in ketoacidosis or comma; water retention that in patients with history of congestive heart failure may result in shortness of breath, pulmonary edema, and decompensation with resultant heart failure; weight gain; swelling or edema; medication-induced neural toxicity; particulate matter embolism and blood vessel occlusion with resultant organ, and/or nervous system infarction; and/or aseptic necrosis of one or more joints. Finally, the patient was informed that Medicine is not an exact science; therefore, there is also the possibility of unforeseen or unpredictable risks and/or possible complications that may result in a catastrophic outcome. The patient indicated having understood very clearly. We have given the patient no guarantees and we have made no promises. Enough time was given to the patient to ask questions, all of which were answered to the patient's satisfaction. [name] has indicated that he wanted to continue with the procedure. Attestation: I, the ordering provider, attest that I have discussed with the patient the benefits, risks, side-effects, alternatives, likelihood of achieving goals, and potential problems during recovery for the procedure that I have provided informed consent. Date  Time: 02/16/2022 10:18 AM  Pre-Procedure Preparation:  Monitoring: As per clinic protocol. Respiration, ETCO2, SpO2, BP, heart rate and rhythm monitor placed and checked for adequate function Safety Precautions: Patient was assessed for positional comfort and pressure points  before starting the procedure. Time-out: I initiated and conducted the "Time-out" before starting the procedure, as per protocol. The patient was asked to participate by confirming the accuracy of the "Time Out" information. Verification of the correct person, site, and procedure were performed and confirmed by me, the nursing staff, and the patient. "Time-out" conducted as per Joint Commission's Universal Protocol (UP.01.01.01). Time: 1045  Description of Procedure:          Painful area was marked on bilateral feet.  Capsaicin patch was applied to the plantar and dorsal aspect of each feet.    Vitals:   02/16/22 1041 02/16/22 1143  BP: (!) 85/62 (!) 93/58  Pulse: 78 83  Resp: 16 16  Temp: (!) 97.1 F (36.2 C) (!) 97.1 F (36.2 C)  TempSrc: Temporal Temporal  SpO2: 97% 100%  Weight: 192 lb (87.1 kg)      Start Time: 1046 hrs. End Time: 1152 hrs.  Post-operative Assessment:  Post-procedure Vital Signs:  Pulse/HCG Rate: 83  Temp:  (!) 97.1 F (36.2 C) Resp: 16 BP: (!) 93/58 SpO2: 100 %  EBL: None  Complications: No immediate post-treatment complications observed by team, or reported by patient.  Note: The patient tolerated the entire procedure well. A repeat set of vitals were taken after the procedure and the patient was kept under observation following institutional policy, for this type of procedure. Post-procedural neurological assessment was performed, showing return to baseline, prior to discharge. The patient was provided with post-procedure discharge instructions, including a section on how to identify potential problems. Should any problems arise concerning this procedure, the patient was given instructions to immediately contact us, at any time, without hesitation. In any case, we plan to contact the patient by telephone for a follow-up status report regarding this interventional procedure.  Comments:  No additional relevant information.  Plan of Care    Chronic  Opioid Analgesic:  Norco 10 mg 3 times daily as needed, quantity 90/month MME equals 30   Medications ordered for  procedure: Meds ordered this encounter  Medications   capsaicin topical system 8 % patch 4 patch   Medications administered: We administered capsaicin topical system.  See the medical record for exact dosing, route, and time of administration.  Follow-up plan:   Return for Keep sch. appt.       s/p R GH shoulder joint injection 04/03/19, 08/26/19 (anterior approach), 03/23/2020- repeat prn; s/p right axillary nerve PNS, Qutenza #1  04/28/21, #2 02/16/22         Recent Visits Date Type Provider Dept  01/20/22 Office Visit Gillis Santa, MD [facility]  Showing recent visits within past 90 days and meeting all other requirements Today's Visits Date Type Provider Dept  02/16/22 Procedure visit Gillis Santa, MD [facility]  Showing today's visits and meeting all other requirements Future Appointments Date Type Provider Dept  04/19/22 Appointment Gillis Santa, MD [facility]  Showing future appointments within next 90 days and meeting all other requirements  Disposition: Discharge home  Discharge (Date  Time): 02/16/2022;   hrs.   Primary Care Physician: Tracie Harrier, MD Location: [facility] Note by: Gillis Santa, MD Date: 02/16/2022; Time: 11:53 AM  Disclaimer:  Medicine is not an exact science. The only guarantee in medicine is that nothing is guaranteed. It is important to note that the decision to proceed with this intervention was based on the information collected from the patient. The Data and conclusions were drawn from the patient's questionnaire, the interview, and the physical examination. Because the information was provided in large part by the patient, it cannot be guaranteed that it has not been purposely or unconsciously manipulated. Every effort has been made to obtain as much relevant data as  possible for this evaluation. It is important to note that the conclusions that lead to this procedure are derived in large part from the available data. Always take into account that the treatment will also be dependent on availability of resources and existing treatment guidelines, considered by other Pain Management Practitioners as being common knowledge and practice, at the time of the intervention. For Medico-Legal purposes, it is also important to point out that variation in procedural techniques and pharmacological choices are the acceptable norm. The indications, contraindications, technique, and results of the above procedure should only be interpreted and judged by a Board-Certified Interventional Pain Specialist with extensive familiarity and expertise in the same exact procedure and technique.

## 2022-02-17 ENCOUNTER — Telehealth: Payer: Self-pay

## 2022-02-17 NOTE — Telephone Encounter (Signed)
Post procedure phone call. Patient states he is doing well.  

## 2022-04-19 ENCOUNTER — Ambulatory Visit
Payer: Medicare HMO | Attending: Student in an Organized Health Care Education/Training Program | Admitting: Student in an Organized Health Care Education/Training Program

## 2022-04-19 ENCOUNTER — Encounter: Payer: Self-pay | Admitting: Student in an Organized Health Care Education/Training Program

## 2022-04-19 VITALS — BP 104/50 | HR 86 | Temp 97.2°F | Resp 16 | Ht 67.0 in | Wt 182.0 lb

## 2022-04-19 DIAGNOSIS — M19011 Primary osteoarthritis, right shoulder: Secondary | ICD-10-CM | POA: Insufficient documentation

## 2022-04-19 DIAGNOSIS — G8929 Other chronic pain: Secondary | ICD-10-CM | POA: Diagnosis not present

## 2022-04-19 DIAGNOSIS — M25512 Pain in left shoulder: Secondary | ICD-10-CM | POA: Insufficient documentation

## 2022-04-19 DIAGNOSIS — I739 Peripheral vascular disease, unspecified: Secondary | ICD-10-CM | POA: Insufficient documentation

## 2022-04-19 DIAGNOSIS — E1140 Type 2 diabetes mellitus with diabetic neuropathy, unspecified: Secondary | ICD-10-CM | POA: Insufficient documentation

## 2022-04-19 DIAGNOSIS — G894 Chronic pain syndrome: Secondary | ICD-10-CM | POA: Insufficient documentation

## 2022-04-19 DIAGNOSIS — I509 Heart failure, unspecified: Secondary | ICD-10-CM | POA: Insufficient documentation

## 2022-04-19 DIAGNOSIS — M25511 Pain in right shoulder: Secondary | ICD-10-CM | POA: Insufficient documentation

## 2022-04-19 MED ORDER — BELBUCA 300 MCG BU FILM: 1.0000 | ORAL_FILM | Freq: Two times a day (BID) | BUCCAL | 0 refills | Status: AC

## 2022-04-19 MED ORDER — BELBUCA 450 MCG BU FILM: 1.0000 | ORAL_FILM | Freq: Two times a day (BID) | BUCCAL | 0 refills | Status: AC

## 2022-04-19 MED ORDER — HYDROCODONE-ACETAMINOPHEN 10-325 MG PO TABS: 1.0000 | ORAL_TABLET | Freq: Three times a day (TID) | ORAL | 0 refills | Status: DC | PRN

## 2022-04-19 NOTE — Progress Notes (Signed)
Nursing Pain Medication Assessment:  Safety precautions to be maintained throughout the outpatient stay will include: orient to surroundings, keep bed in low position, maintain call bell within reach at all times, provide assistance with transfer out of bed and ambulation.  Medication Inspection Compliance: Pill count conducted under aseptic conditions, in front of the patient. Neither the pills nor the bottle was removed from the patient's sight at any time. Once count was completed pills were immediately returned to the patient in their original bottle.  Medication: Hydrocodone/APAP Pill/Patch Count:  33 of 90 pills remain Pill/Patch Appearance: Markings consistent with prescribed medication Bottle Appearance: Standard pharmacy container. Clearly labeled. Filled Date: 09 / 07 / 2023 Last Medication intake:  Today

## 2022-04-19 NOTE — Progress Notes (Signed)
PROVIDER NOTE: Information contained herein reflects review and annotations entered in association with encounter. Interpretation of such information and data should be left to medically-trained personnel. Information provided to patient can be located elsewhere in the medical record under "Patient Instructions". Document created using STT-dictation technology, any transcriptional errors that may result from process are unintentional.    Patient: [name]  Service Category: E/M  Provider: Gillis Santa, MD  DOB: 16-Aug-1960  DOS: 04/19/2022  Specialty: Interventional Pain Management  MRN: [id number]  Setting: Ambulatory outpatient  PCP: Tracie Harrier, MD  Type: Established Patient    Referring Provider: Tracie Harrier, MD  Location: Office  Delivery: Face-to-face     HPI  [name], a 61 y.o. year old male, is here today because of his Chronic painful diabetic neuropathy ([name]) [E11.40]. [name] primary complain today is Leg Pain (From knees down bilaterally)  Last encounter: My last encounter with him was on 01/20/2022   Pertinent problems: [name] has PAD (peripheral artery disease) ([place]); Carotid stenosis; Carotid stenosis, symptomatic w/o infarct, left; CAD (coronary artery disease); Chronic right shoulder pain; S/P CABG (coronary artery bypass graft); DM (diabetes mellitus) ([place]); and Chronic pain syndrome on their pertinent problem list. Pain Assessment: Severity of Chronic pain is reported as a 7 /10. Location: Leg Right, Left/radiates from knees down. Onset: More than a month ago. Quality: Aching, Sharp. Timing: Constant. Modifying factor(s): meds. Vitals:  height is '5\' 7"'  (1.702 m) and weight is 182 lb (82.6 kg). His temperature is 97.2 F (36.2 C) (abnormal). His blood pressure is 104/50 (abnormal) and his pulse is 86. His respiration is 16 and oxygen saturation is 95%.   Reason for encounter: medication management.    [name] follows up today for medication  management, he endorses decreased analgesic response from his hydrocodone.  He is currently on 10 mg 3 times a day.  We discussed transition to buprenorphine: Belbuca.  He also had his second Qutenza treatment which he states was fairly painful on the day of the procedure.  He had significant burning when he got home of his feet.  He states that it improved by the next day.  He is endorsing mild to moderate pain relief of his paresthesias of bilateral feet.  We will continue to monitor his symptoms.  Titration instructions provided for belbuca as below.  1 prescription for hydrocodone.  Will hopefully decrease/wean hydrocodone if he gets benefit with belbuca.  Pharmacotherapy Assessment  Analgesic: Norco 10 mg 3 times daily as needed, quantity 90/month MME equals 30   Monitoring: [place] PMP: PDMP not reviewed this encounter.       Pharmacotherapy: No side-effects or adverse reactions reported. Compliance: No problems identified. Effectiveness: Clinically acceptable.  UDS:  Summary  Date Value Ref Range Status  10/14/2021 Note  Final    Comment:    ==================================================================== [employer] [facility] (MW) ==================================================================== Test                             Result       Flag       Units  Drug Present and Declared for Prescription Verification   Hydrocodone                    3404         EXPECTED   ng/mg creat   Hydromorphone  981          EXPECTED   ng/mg creat   Dihydrocodeine                 405          EXPECTED   ng/mg creat   Norhydrocodone                 4328         EXPECTED   ng/mg creat    Sources of hydrocodone include scheduled prescription medications.    Hydromorphone, dihydrocodeine and norhydrocodone are expected    metabolites of hydrocodone. Hydromorphone and dihydrocodeine are    also available as scheduled prescription  medications.  ==================================================================== Test                      Result    Flag   Units      Ref Range   Creatinine              57               mg/dL      >=20 ==================================================================== Declared Medications:  The flagging and interpretation on this report are based on the  following declared medications.  Unexpected results may arise from  inaccuracies in the declared medications.   **Note: The testing scope of this panel includes these medications:   Hydrocodone (Norco)   **Note: The testing scope of this panel does not include the  following reported medications:   Acetaminophen (Tylenol)  Acetaminophen (Norco)  Albuterol (Ventolin HFA)  Aspirin  Bupropion (Wellbutrin)  Carvedilol (Coreg)  Furosemide (Lasix)  Metformin (Glucophage)  Pirfenidone (Esbriet)  Pravastatin (Pravachol)  Sodium Chloride  Spironolactone (Aldactone)  Tamsulosin (Flomax) ==================================================================== For clinical consultation, please call [phone]. ====================================================================       ROS  Constitutional: Denies any fever or chills Gastrointestinal: No reported hemesis, hematochezia, vomiting, or acute GI distress Musculoskeletal:  + bilateral knee pain Neurological:  Paresthesias of bilateral feet  Medication Review  Buprenorphine HCl, HYDROcodone-acetaminophen, Pirfenidone, acetaminophen, albuterol, aspirin EC, buPROPion, carvedilol, furosemide, metFORMIN, pravastatin, sodium chloride, spironolactone, and tamsulosin  History Review  Allergy: [name] has No Known Allergies. Drug: [name]  reports no history of drug use. Alcohol:  reports current alcohol use. Tobacco:  reports that he quit smoking about 8 years ago. His smoking use included cigarettes. He has quit using smokeless tobacco. Social: [name]  reports  that he quit smoking about 8 years ago. His smoking use included cigarettes. He has quit using smokeless tobacco. He reports current alcohol use. He reports that he does not use drugs. Medical:  has a past medical history of Cancer ([place]), CHF (congestive heart failure) ([place]), COPD (chronic obstructive pulmonary disease) ([place]), Coronary artery disease, Coronary artery disease, Diabetes mellitus without complication ([place]), Dyspnea, Fibromyalgia, History of kidney stones, IPF (idiopathic pulmonary fibrosis) ([place]), MI (myocardial infarction) ([place]), Peripheral vascular disease ([place]), and Pneumonia. Surgical: Mr. Saini  has a past surgical history that includes Cardiac catheterization (N/A, 06/09/2015); Cardiac catheterization (06/09/2015); Coronary artery bypass graft (2015); Cardiac catheterization; Application if wound vac; Wound debridement; stent in leg (Left); Endarterectomy (Left, 09/08/2017); and Lower Extremity Angiography (Right, 01/29/2019). Family: family history includes Cancer in his father; Heart attack in his mother.  Laboratory Chemistry Profile   Renal Lab Results  Component Value Date   BUN 42 (H) 12/26/2020   CREATININE 1.70 (H) 12/26/2020   GFRAA >60 01/29/2019   GFRNONAA 45 (  L) 12/26/2020     Hepatic Lab Results  Component Value Date   AST 21 08/17/2017   ALT 20 08/17/2017   ALBUMIN 3.7 08/17/2017   ALKPHOS 44 08/17/2017     Electrolytes Lab Results  Component Value Date   NA 133 (L) 12/26/2020   K 4.7 12/26/2020   CL 100 12/26/2020   CALCIUM 9.3 12/26/2020   MG 2.0 07/23/2018   PHOS 4.4 09/10/2017     Bone No results found for: "VD25OH", "VD125OH2TOT", "EU2353IR4", "ER1540GQ6", "25OHVITD1", "25OHVITD2", "25OHVITD3", "TESTOFREE", "TESTOSTERONE"   Inflammation (CRP: Acute Phase) (ESR: Chronic Phase) Lab Results  Component Value Date   LATICACIDVEN 1.4 09/09/2017       Note: Above Lab results reviewed.  Recent Imaging Review  VAS US CAROTID Carotid Arterial  Duplex Study  Patient Name:  RAINE ELSASS  Date of Exam:   12/13/2021 Medical Rec #: [id number]        Accession #:    [id number] Date of Birth: 1961/05/14        Patient Gender: M Patient Age:   44 years Exam Location:  [facility] Procedure:      VAS US CAROTID Referring Phys: Hortencia Pilar  --------------------------------------------------------------------------------   Indications: Carotid artery disease and left endarterectomy.  Performing Technologist: Concha Norway RVT    Examination Guidelines: A complete evaluation includes B-mode imaging, spectral Doppler, color Doppler, and power Doppler as needed of all accessible portions of each vessel. Bilateral testing is considered an integral part of a complete examination. Limited examinations for reoccurring indications may be performed as noted.    Right Carotid Findings: +----------+--------+--------+--------+------------------+--------+           PSV cm/sEDV cm/sStenosisPlaque DescriptionComments +----------+--------+--------+--------+------------------+--------+ CCA Prox  75      14                                         +----------+--------+--------+--------+------------------+--------+ CCA Mid   76      18                                         +----------+--------+--------+--------+------------------+--------+ CCA Distal70      13                                         +----------+--------+--------+--------+------------------+--------+ ICA Prox  155     33      40-59%  calcific                   +----------+--------+--------+--------+------------------+--------+ ICA Mid   117     26                                         +----------+--------+--------+--------+------------------+--------+ ICA Distal116     26                                         +----------+--------+--------+--------+------------------+--------+ ECA       161     13      >  50%     calcific                   +----------+--------+--------+--------+------------------+--------+  +----------+--------+-------+----------------+-------------------+           PSV cm/sEDV cmsDescribe        Arm Pressure (mmHG) +----------+--------+-------+----------------+-------------------+ [id number]            Multiphasic, WNL                    +----------+--------+-------+----------------+-------------------+  +---------+--------+--+--------+--+---------+ VertebralPSV cm/s93EDV cm/s15Antegrade +---------+--------+--+--------+--+---------+     Left Carotid Findings: +----------+--------+--------+--------+------------------+--------+           PSV cm/sEDV cm/sStenosisPlaque DescriptionComments +----------+--------+--------+--------+------------------+--------+ CCA Prox  85      20                                         +----------+--------+--------+--------+------------------+--------+ CCA Mid   79      23                                         +----------+--------+--------+--------+------------------+--------+ CCA Distal84      15                                         +----------+--------+--------+--------+------------------+--------+ ICA Prox  80      21                                         +----------+--------+--------+--------+------------------+--------+ ICA Mid   82      21                                         +----------+--------+--------+--------+------------------+--------+ ICA Distal92      28                                         +----------+--------+--------+--------+------------------+--------+  +----------+--------+--------+----------+-------------------+           PSV cm/sEDV cm/sDescribe  Arm Pressure (mmHG) +----------+--------+--------+----------+-------------------+ [id number]              monophasic                     +----------+--------+--------+----------+-------------------+  +---------+--------+--+--------+--------------+ VertebralPSV cm/s30EDV cm/sNot identified +---------+--------+--+--------+--------------+        Summary: Right Carotid: Velocities in the right ICA are consistent with a 40-59%                stenosis. Non-hemodynamically significant plaque <50% noted in                the CCA. The ECA appears >50% stenosed.  Left Carotid: Velocities in the left ICA are consistent with a 1-39% stenosis.               Non-hemodynamically significant plaque <50% noted in the CCA. The               ECA appears <50% stenosed.  Widely patent ICA s/p CEA.  Vertebrals:  Right vertebral artery demonstrates antegrade flow. Left vertebral              artery was not visualized. Subclavians: Left subclavian artery was stenotic. Normal flow hemodynamics were              seen in the right subclavian artery.  *See table(s) above for measurements and observations.    Electronically signed by Hortencia Pilar MD on 12/16/2021 at 7:16:18 PM.      Final   VAS [place] ABI WITH/WO TBI  LOWER EXTREMITY DOPPLER STUDY  Patient Name:  [name]  Date of Exam:   12/13/2021 Medical Rec #: [id number]        Accession #:    [id number] Date of Birth: [birth year]        Patient Gender: M Patient Age:   73 years Exam Location:  [facility] Procedure:      VAS [place] ABI WITH/WO TBI Referring Phys:  --------------------------------------------------------------------------------   Indications: Peripheral artery disease.  Other Factors: 01/29/2019 right atherectomy. sfa-pop stent and pta of eia.  Vascular Interventions: 05/14/13: Right CIA & left EIA stents with right CFA                         PTAs;                         06/17/15: Right CFA PTA;                         01/29/2019 PTA and stent right SFA and popliteal artery.                         PTA right EIA.  Comparison Study:  11/2020  Performing Technologist: Concha Norway RVT    Examination Guidelines: A complete evaluation includes at minimum, Doppler waveform signals and systolic blood pressure reading at the level of bilateral brachial, anterior tibial, and posterior tibial arteries, when vessel segments are accessible. Bilateral testing is considered an integral part of a complete examination. Photoelectric Plethysmograph (PPG) waveforms and toe systolic pressure readings are included as required and additional duplex testing as needed. Limited examinations for reoccurring indications may be performed as noted.    ABI Findings: +---------+------------------+-----+---------+--------+ Right    Rt Pressure (mmHg)IndexWaveform Comment  +---------+------------------+-----+---------+--------+ Brachial 97                                       +---------+------------------+-----+---------+--------+ ATA                             triphasicNonComp  +---------+------------------+-----+---------+--------+ PTA                             triphasicNonComp  +---------+------------------+-----+---------+--------+ [facility]               1.16 Normal            +---------+------------------+-----+---------+--------+  +---------+------------------+-----+--------+-------+ Left     Lt Pressure (mmHg)IndexWaveformComment +---------+------------------+-----+--------+-------+ Brachial 97                                     +---------+------------------+-----+--------+-------+  ATA                                     NonComp +---------+------------------+-----+--------+-------+ PTA      148               1.53         NonComp +---------+------------------+-----+--------+-------+ Great Toe106               1.09 Normal          +---------+------------------+-----+--------+-------+  +-------+-----------+-----------+------------+------------+ ABI/TBIToday's  ABIToday's TBIPrevious ABIPrevious TBI +-------+-----------+-----------+------------+------------+ Right  NonComp    1.16       NonComp     .80          +-------+-----------+-----------+------------+------------+ Left   NonComp    1.06       1.12        .95          +-------+-----------+-----------+------------+------------+  Bilateral ABIs and TBIs appear essentially unchanged compared to prior study on 11/2020.   Summary: Right: Resting right ankle-brachial index indicates noncompressible right lower extremity arteries. The right toe-brachial index is normal.  Left: Resting left ankle-brachial index indicates noncompressible left lower extremity arteries. The left toe-brachial index is normal.  *See table(s) above for measurements and observations.    Electronically signed by Hortencia Pilar MD on 12/16/2021 at 7:08:34 PM.      Final    Note: Reviewed        Physical Exam  General appearance: Well nourished, well developed, and well hydrated. In no apparent acute distress Mental status: Alert, oriented x 3 (person, place, & time)       Respiratory: No evidence of acute respiratory distress Eyes: PERLA Vitals: BP (!) 104/50   Pulse 86   Temp (!) 97.2 F (36.2 C)   Resp 16   Ht '5\' 7"'  (1.702 m)   Wt 182 lb (82.6 kg)   SpO2 95%   BMI 28.51 kg/m  BMI: Estimated body mass index is 28.51 kg/m as calculated from the following:   Height as of this encounter: '5\' 7"'  (1.702 m).   Weight as of this encounter: 182 lb (82.6 kg). Ideal: Ideal body weight: 66.1 kg (145 lb 11.6 oz) Adjusted ideal body weight: 72.7 kg (160 lb 3.8 oz)    Cervical Spine Area Exam  Skin & Axial Inspection: No masses, redness, edema, swelling, or associated skin lesions Alignment: Symmetrical Functional ROM: Unrestricted ROM      Stability: No instability detected Muscle Tone/Strength: Functionally intact. No obvious neuro-muscular anomalies detected. Sensory (Neurological): Unimpaired    Lower Extremity Exam    Side: Right lower extremity  Side: Left lower extremity  Stability: No instability observed          Stability: No instability observed          Skin & Extremity Inspection: Skin color, temperature, and hair growth are WNL. No peripheral edema or cyanosis. No masses, redness, swelling, asymmetry, or associated skin lesions. No contractures.  Skin & Extremity Inspection: Skin color, temperature, and hair growth are WNL. No peripheral edema or cyanosis. No masses, redness, swelling, asymmetry, or associated skin lesions. No contractures.  Functional ROM: Unrestricted ROM                  Functional ROM: Unrestricted ROM                  Muscle Tone/Strength: Functionally intact.  No obvious neuro-muscular anomalies detected.  Muscle Tone/Strength: Functionally intact. No obvious neuro-muscular anomalies detected.  Sensory (Neurological): Neurogenic pain pattern      and arthropathic  Sensory (Neurological): Neurogenic pain pattern      and arthropathic  DTR: Patellar: deferred today Achilles: deferred today Plantar: deferred today  DTR: Patellar: deferred today Achilles: deferred today Plantar: deferred today  Palpation: No palpable anomalies  Palpation: No palpable anomalies    Assessment   Status Diagnosis  Persistent Persistent Persistent 1. Chronic painful diabetic neuropathy ([place])   2. Chronic right shoulder pain   3. Primary osteoarthritis of right shoulder   4. Congestive heart failure, unspecified HF chronicity, unspecified heart failure type ([place])   5. PAD (peripheral artery disease) ([place])   6. Chronic pain of both shoulders   7. Chronic pain syndrome       Plan of Care    Mr. ALADDIN KOLLMANN has a current medication list which includes the following long-term medication(s): albuterol, furosemide, hydrocodone-acetaminophen, [START ON 04/29/2022] hydrocodone-acetaminophen, metformin, pravastatin, sodium chloride, bupropion, and  carvedilol.     Pharmacotherapy (Medications Ordered): Meds ordered this encounter  Medications   Buprenorphine HCl (BELBUCA) 300 MCG FILM    Sig: Place 1 Film inside cheek every 12 (twelve) hours.    Dispense:  60 each    Refill:  0   Buprenorphine HCl (BELBUCA) 450 MCG FILM    Sig: Place 1 Film (450 mcg total) inside cheek every 12 (twelve) hours.    Dispense:  60 each    Refill:  0   HYDROcodone-acetaminophen (NORCO) 10-325 MG tablet    Sig: Take 1 tablet by mouth every 8 (eight) hours as needed for severe pain. Must last 30 days.    Dispense:  90 tablet    Refill:  0    Chronic Pain: STOP Act (Not applicable) Fill 1 day early if closed on refill date. Avoid benzodiazepines within 8 hours of opioids     Patient is tried gabapentin, Lyrica, Cymbalta which resulted in side effects including nausea, cognitive changes which the patient did not like.  Follow-up plan:   Return in about 8 weeks (around 06/14/2022) for Medication Management, in person.     s/p R GH shoulder joint injection 04/03/19, 08/26/19 (anterior approach), 03/23/2020- repeat prn; s/p right axillary nerve PNS         Recent Visits Date Type Provider Dept  02/16/22 Procedure visit Gillis Santa, MD [facility]  01/20/22 Office Visit Gillis Santa, MD [facility]  Showing recent visits within past 90 days and meeting all other requirements Today's Visits Date Type Provider Dept  04/19/22 Office Visit Gillis Santa, MD [facility]  Showing today's visits and meeting all other requirements Future Appointments Date Type Provider Dept  06/21/22 Appointment Gillis Santa, MD [facility]  Showing future appointments within next 90 days and meeting all other requirements  I discussed the assessment and treatment plan with the patient. The patient was provided an opportunity to ask questions and all were answered. The patient agreed with the plan and demonstrated an understanding of  the instructions.  Patient advised to call back or seek an in-person evaluation if the symptoms or condition worsens.  Duration of encounter:25mnutes.  Note by: BGillis Santa MD Date: 04/19/2022; Time: 9:16 AM

## 2022-04-20 ENCOUNTER — Telehealth: Payer: Self-pay | Admitting: *Deleted

## 2022-04-20 NOTE — Telephone Encounter (Signed)
Received fax from [facility] questioning strength of belbuca.  300 mcg vs 450 mcg.    Clarification with [name] that he is increasing the patient in October and that is the reason for the difference in mcg.

## 2022-05-02 ENCOUNTER — Other Ambulatory Visit: Payer: Self-pay | Admitting: *Deleted

## 2022-05-02 ENCOUNTER — Telehealth: Payer: Self-pay | Admitting: Student in an Organized Health Care Education/Training Program

## 2022-05-02 MED ORDER — HYDROCODONE-ACETAMINOPHEN 10-325 MG PO TABS: 1.0000 | ORAL_TABLET | Freq: Three times a day (TID) | ORAL | 0 refills | Status: DC | PRN

## 2022-05-02 NOTE — Telephone Encounter (Signed)
PMP verifies this

## 2022-05-02 NOTE — Telephone Encounter (Signed)
PT was only 10 pills from [facility] on 04-30-22. PT states that he has 4 pills left. PT stated that [facility] has medications in stock. [facility] needs the pre autho send in to refill patient prescription, Please give patient a call. Thanks

## 2022-05-03 NOTE — Telephone Encounter (Signed)
Patient notified

## 2022-05-04 DIAGNOSIS — I5020 Unspecified systolic (congestive) heart failure: Secondary | ICD-10-CM | POA: Diagnosis not present

## 2022-05-04 DIAGNOSIS — I9789 Other postprocedural complications and disorders of the circulatory system, not elsewhere classified: Secondary | ICD-10-CM | POA: Diagnosis not present

## 2022-05-04 DIAGNOSIS — I4891 Unspecified atrial fibrillation: Secondary | ICD-10-CM | POA: Diagnosis not present

## 2022-05-04 DIAGNOSIS — Z951 Presence of aortocoronary bypass graft: Secondary | ICD-10-CM | POA: Diagnosis not present

## 2022-05-04 DIAGNOSIS — E785 Hyperlipidemia, unspecified: Secondary | ICD-10-CM | POA: Diagnosis not present

## 2022-05-04 DIAGNOSIS — I214 Non-ST elevation (NSTEMI) myocardial infarction: Secondary | ICD-10-CM | POA: Diagnosis not present

## 2022-05-04 DIAGNOSIS — I739 Peripheral vascular disease, unspecified: Secondary | ICD-10-CM | POA: Diagnosis not present

## 2022-05-04 DIAGNOSIS — I2510 Atherosclerotic heart disease of native coronary artery without angina pectoris: Secondary | ICD-10-CM | POA: Diagnosis not present

## 2022-05-04 DIAGNOSIS — I429 Cardiomyopathy, unspecified: Secondary | ICD-10-CM | POA: Diagnosis not present

## 2022-05-19 DIAGNOSIS — I5020 Unspecified systolic (congestive) heart failure: Secondary | ICD-10-CM | POA: Diagnosis not present

## 2022-05-23 ENCOUNTER — Encounter (INDEPENDENT_AMBULATORY_CARE_PROVIDER_SITE_OTHER): Payer: Self-pay

## 2022-06-01 ENCOUNTER — Other Ambulatory Visit: Payer: Self-pay | Admitting: *Deleted

## 2022-06-01 ENCOUNTER — Telehealth: Payer: Self-pay | Admitting: Student in an Organized Health Care Education/Training Program

## 2022-06-01 MED ORDER — HYDROCODONE-ACETAMINOPHEN 10-325 MG PO TABS: 1.0000 | ORAL_TABLET | Freq: Three times a day (TID) | ORAL | 0 refills | Status: DC | PRN

## 2022-06-01 NOTE — Telephone Encounter (Signed)
Patient notified

## 2022-06-01 NOTE — Telephone Encounter (Signed)
PT stated that [facility] has the hydrocodone. PT will like for prescription to be send they. PT stated that the Buprenorphine is helping ,however it's high. Please give patient a call. Thanks

## 2022-06-01 NOTE — Telephone Encounter (Signed)
Rx request sent to Dr. Holley Raring. Patient is due for a refill. Last filled on 05-02-22. Next appt 06-21-22.

## 2022-06-21 ENCOUNTER — Encounter: Payer: Self-pay | Admitting: Student in an Organized Health Care Education/Training Program

## 2022-06-21 ENCOUNTER — Ambulatory Visit
Payer: Medicare HMO | Attending: Student in an Organized Health Care Education/Training Program | Admitting: Student in an Organized Health Care Education/Training Program

## 2022-06-21 VITALS — BP 145/71 | HR 93 | Temp 97.2°F | Ht 67.0 in | Wt 182.0 lb

## 2022-06-21 DIAGNOSIS — E1140 Type 2 diabetes mellitus with diabetic neuropathy, unspecified: Secondary | ICD-10-CM | POA: Insufficient documentation

## 2022-06-21 DIAGNOSIS — I6522 Occlusion and stenosis of left carotid artery: Secondary | ICD-10-CM | POA: Insufficient documentation

## 2022-06-21 DIAGNOSIS — I509 Heart failure, unspecified: Secondary | ICD-10-CM | POA: Diagnosis not present

## 2022-06-21 DIAGNOSIS — G894 Chronic pain syndrome: Secondary | ICD-10-CM | POA: Insufficient documentation

## 2022-06-21 DIAGNOSIS — I739 Peripheral vascular disease, unspecified: Secondary | ICD-10-CM | POA: Diagnosis not present

## 2022-06-21 DIAGNOSIS — M19011 Primary osteoarthritis, right shoulder: Secondary | ICD-10-CM | POA: Insufficient documentation

## 2022-06-21 DIAGNOSIS — E1142 Type 2 diabetes mellitus with diabetic polyneuropathy: Secondary | ICD-10-CM | POA: Insufficient documentation

## 2022-06-21 DIAGNOSIS — M25512 Pain in left shoulder: Secondary | ICD-10-CM | POA: Diagnosis not present

## 2022-06-21 DIAGNOSIS — G8929 Other chronic pain: Secondary | ICD-10-CM | POA: Diagnosis not present

## 2022-06-21 DIAGNOSIS — M25511 Pain in right shoulder: Secondary | ICD-10-CM | POA: Diagnosis not present

## 2022-06-21 MED ORDER — HYDROCODONE-ACETAMINOPHEN 10-325 MG PO TABS: 1.0000 | ORAL_TABLET | Freq: Three times a day (TID) | ORAL | 0 refills | Status: DC | PRN

## 2022-06-21 MED ORDER — BELBUCA 450 MCG BU FILM: 1.0000 | ORAL_FILM | Freq: Two times a day (BID) | BUCCAL | 2 refills | Status: DC

## 2022-06-21 NOTE — Progress Notes (Signed)
Nursing Pain Medication Assessment:  Safety precautions to be maintained throughout the outpatient stay will include: orient to surroundings, keep bed in low position, maintain call bell within reach at all times, provide assistance with transfer out of bed and ambulation.  Medication Inspection Compliance: Pill count conducted under aseptic conditions, in front of the patient. Neither the pills nor the bottle was removed from the patient's sight at any time. Once count was completed pills were immediately returned to the patient in their original bottle.  Medication #1: Buprenorphine (Suboxone) Pill/Patch Count:  16 of 60 pills remain Pill/Patch Appearance: Markings consistent with prescribed medication Bottle Appearance: Standard pharmacy container. Clearly labeled. Filled Date: 35 / 6 / 2023 Last Medication intake:  Today  Medication #2: Hydrocodone/APAP Pill/Patch Count:  33 of 90 pills remain Pill/Patch Appearance: Markings consistent with prescribed medication Bottle Appearance: Standard pharmacy container. Clearly labeled. Filled Date: 76 / 8 / 2023 Last Medication intake:  Today

## 2022-06-21 NOTE — Progress Notes (Signed)
PROVIDER NOTE: Information contained herein reflects review and annotations entered in association with encounter. Interpretation of such information and data should be left to medically-trained personnel. Information provided to patient can be located elsewhere in the medical record under "Patient Instructions". Document created using STT-dictation technology, any transcriptional errors that may result from process are unintentional.    Patient: [name]  Service Category: E/M  Provider: Gillis Santa, MD  DOB: 03-02-1961  DOS: 06/21/2022  Specialty: Interventional Pain Management  MRN: [id number]  Setting: Ambulatory outpatient  PCP: Tracie Harrier, MD  Type: Established Patient    Referring Provider: Tracie Harrier, MD  Location: Office  Delivery: Face-to-face     HPI  [name], a 61 y.o. year old male, is here today because of his Chronic painful diabetic neuropathy ([place]) [E11.40]. [name] primary complain today is Leg Pain (both)  Last encounter: My last encounter with him was on 04/19/22   Pertinent problems: [name] has PAD (peripheral artery disease) ([place]); Carotid stenosis; Carotid stenosis, symptomatic w/o infarct, left; CAD (coronary artery disease); Chronic right shoulder pain; S/P CABG (coronary artery bypass graft); DM (diabetes mellitus) ([place]); and Chronic pain syndrome on their pertinent problem list. Pain Assessment: Severity of Chronic pain is reported as a 3 /10. Location: Leg Left, Right/pain radiaities down both leg to his feet. Onset: More than a month ago. Quality: Constant, Aching, Burning, Tightness, Stabbing, Shooting. Timing: Constant. Modifying factor(s): Meds and lay down. Vitals:  height is 5' 7" (1.702 m) and weight is 182 lb (82.6 kg). His temperature is 97.2 F (36.2 C) (abnormal). His blood pressure is 145/71 (abnormal) and his pulse is 93. His oxygen saturation is 95%.   Reason for encounter: medication management.    Recently had an  ECHO done that showed decreased EF- around 20%. He states that they are discussing an AICD but do not recommend it under [place] given his C/V status. He's going to see if that procedure can be done under moderate sedation. Otherwise no change in medical history since last visit.  Patient's pain is at baseline.  Patient continues multimodal pain regimen as prescribed.  States that it provides pain relief and improvement in functional status. Is pleased with his current regimen and states that belbuca it is very effective for him.  The only downside he states is that it is expensive.  Pharmacotherapy Assessment  Analgesic: Belbuca 450 mcg BID, Norco 10 mg 3 times daily as needed, quantity 90/month MME equals 30   Monitoring: [place] PMP: PDMP reviewed during this encounter.       Pharmacotherapy: No side-effects or adverse reactions reported. Compliance: No problems identified. Effectiveness: Clinically acceptable.  UDS:  Summary  Date Value Ref Range Status  10/14/2021 Note  Final    Comment:    ==================================================================== [employer] [facility] (MW) ==================================================================== Test                             Result       Flag       Units  Drug Present and Declared for Prescription Verification   Hydrocodone                    3404         EXPECTED   ng/mg creat   Hydromorphone                  981  EXPECTED   ng/mg creat   Dihydrocodeine                 405          EXPECTED   ng/mg creat   Norhydrocodone                 4328         EXPECTED   ng/mg creat    Sources of hydrocodone include scheduled prescription medications.    Hydromorphone, dihydrocodeine and norhydrocodone are expected    metabolites of hydrocodone. Hydromorphone and dihydrocodeine are    also available as scheduled prescription medications.  ==================================================================== Test                       Result    Flag   Units      Ref Range   Creatinine              57               mg/dL      >=20 ==================================================================== Declared Medications:  The flagging and interpretation on this report are based on the  following declared medications.  Unexpected results may arise from  inaccuracies in the declared medications.   **Note: The testing scope of this panel includes these medications:   Hydrocodone (Norco)   **Note: The testing scope of this panel does not include the  following reported medications:   Acetaminophen (Tylenol)  Acetaminophen (Norco)  Albuterol (Ventolin HFA)  Aspirin  Bupropion (Wellbutrin)  Carvedilol (Coreg)  Furosemide (Lasix)  Metformin (Glucophage)  Pirfenidone (Esbriet)  Pravastatin (Pravachol)  Sodium Chloride  Spironolactone (Aldactone)  Tamsulosin (Flomax) ==================================================================== For clinical consultation, please call [phone]. ====================================================================       ROS  Constitutional: Denies any fever or chills Gastrointestinal: No reported hemesis, hematochezia, vomiting, or acute GI distress Musculoskeletal:  + bilateral knee pain Neurological:  Paresthesias of bilateral feet  Medication Review  Buprenorphine HCl, HYDROcodone-acetaminophen, Pirfenidone, acetaminophen, albuterol, aspirin EC, buPROPion, carvedilol, furosemide, metFORMIN, pravastatin, sodium chloride, spironolactone, and tamsulosin  History Review  Allergy: [name] has No Known Allergies. Drug: [name]  reports no history of drug use. Alcohol:  reports current alcohol use. Tobacco:  reports that he quit smoking about 8 years ago. His smoking use included cigarettes. He has quit using smokeless tobacco. Social: [name]  reports that he quit smoking about 8 years ago. His smoking use included cigarettes. He has quit using smokeless tobacco.  He reports current alcohol use. He reports that he does not use drugs. Medical:  has a past medical history of Cancer ([place]), CHF (congestive heart failure) ([place]), COPD (chronic obstructive pulmonary disease) ([place]), Coronary artery disease, Coronary artery disease, Diabetes mellitus without complication ([place]), Dyspnea, Fibromyalgia, History of kidney stones, IPF (idiopathic pulmonary fibrosis) ([place]), MI (myocardial infarction) ([place]), Peripheral vascular disease ([place]), and Pneumonia. Surgical: Mr. Gill  has a past surgical history that includes Cardiac catheterization (N/A, 06/09/2015); Cardiac catheterization (06/09/2015); Coronary artery bypass graft (2015); Cardiac catheterization; Application if wound vac; Wound debridement; stent in leg (Left); Endarterectomy (Left, 09/08/2017); and Lower Extremity Angiography (Right, 01/29/2019). Family: family history includes Cancer in his father; Heart attack in his mother.  Laboratory Chemistry Profile   Renal Lab Results  Component Value Date   BUN 42 (H) 12/26/2020   CREATININE 1.70 (H) 12/26/2020   GFRAA >60 01/29/2019   GFRNONAA 45 (L) 12/26/2020     Hepatic Lab Results  Component Value Date   AST 21 08/17/2017   ALT 20 08/17/2017   ALBUMIN 3.7 08/17/2017   ALKPHOS 44 08/17/2017     Electrolytes Lab Results  Component Value Date   NA 133 (L) 12/26/2020   K 4.7 12/26/2020   CL 100 12/26/2020   CALCIUM 9.3 12/26/2020   MG 2.0 07/23/2018   PHOS 4.4 09/10/2017     Bone No results found for: "VD25OH", "VD125OH2TOT", "EP3295JO8", "CZ6606TK1", "25OHVITD1", "25OHVITD2", "25OHVITD3", "TESTOFREE", "TESTOSTERONE"   Inflammation (CRP: Acute Phase) (ESR: Chronic Phase) Lab Results  Component Value Date   LATICACIDVEN 1.4 09/09/2017       Note: Above Lab results reviewed.  Recent Imaging Review  VAS US CAROTID Carotid Arterial Duplex Study  Patient Name:  EUGEAN ARNOTT  Date of Exam:   12/13/2021 Medical Rec #: [id number]         Accession #:    [id number] Date of Birth: [birth year]        Patient Gender: M Patient Age:   35 years Exam Location:  [facility] Procedure:      VAS US CAROTID Referring Phys: Hortencia Pilar  --------------------------------------------------------------------------------   Indications: Carotid artery disease and left endarterectomy.  Performing Technologist: Concha Norway RVT    Examination Guidelines: A complete evaluation includes B-mode imaging, spectral Doppler, color Doppler, and power Doppler as needed of all accessible portions of each vessel. Bilateral testing is considered an integral part of a complete examination. Limited examinations for reoccurring indications may be performed as noted.    Right Carotid Findings: +----------+--------+--------+--------+------------------+--------+           PSV cm/sEDV cm/sStenosisPlaque DescriptionComments +----------+--------+--------+--------+------------------+--------+ CCA Prox  75      14                                         +----------+--------+--------+--------+------------------+--------+ CCA Mid   76      18                                         +----------+--------+--------+--------+------------------+--------+ CCA Distal70      13                                         +----------+--------+--------+--------+------------------+--------+ ICA Prox  155     33      40-59%  calcific                   +----------+--------+--------+--------+------------------+--------+ ICA Mid   117     26                                         +----------+--------+--------+--------+------------------+--------+ ICA Distal116     26                                         +----------+--------+--------+--------+------------------+--------+ ECA       161     13      >50%    calcific                    +----------+--------+--------+--------+------------------+--------+  +----------+--------+-------+----------------+-------------------+  PSV cm/sEDV cmsDescribe        Arm Pressure (mmHG) +----------+--------+-------+----------------+-------------------+ [id number]            Multiphasic, WNL                    +----------+--------+-------+----------------+-------------------+  +---------+--------+--+--------+--+---------+ VertebralPSV cm/s93EDV cm/s15Antegrade +---------+--------+--+--------+--+---------+     Left Carotid Findings: +----------+--------+--------+--------+------------------+--------+           PSV cm/sEDV cm/sStenosisPlaque DescriptionComments +----------+--------+--------+--------+------------------+--------+ CCA Prox  85      20                                         +----------+--------+--------+--------+------------------+--------+ CCA Mid   79      23                                         +----------+--------+--------+--------+------------------+--------+ CCA Distal84      15                                         +----------+--------+--------+--------+------------------+--------+ ICA Prox  80      21                                         +----------+--------+--------+--------+------------------+--------+ ICA Mid   82      21                                         +----------+--------+--------+--------+------------------+--------+ ICA Distal92      28                                         +----------+--------+--------+--------+------------------+--------+  +----------+--------+--------+----------+-------------------+           PSV cm/sEDV cm/sDescribe  Arm Pressure (mmHG) +----------+--------+--------+----------+-------------------+ [id number]              monophasic                     +----------+--------+--------+----------+-------------------+  +---------+--------+--+--------+--------------+ VertebralPSV cm/s30EDV cm/sNot identified +---------+--------+--+--------+--------------+        Summary: Right Carotid: Velocities in the right ICA are consistent with a 40-59%                stenosis. Non-hemodynamically significant plaque <50% noted in                the CCA. The ECA appears >50% stenosed.  Left Carotid: Velocities in the left ICA are consistent with a 1-39% stenosis.               Non-hemodynamically significant plaque <50% noted in the CCA. The               ECA appears <50% stenosed. Widely patent ICA s/p CEA.  Vertebrals:  Right vertebral artery demonstrates antegrade flow. Left vertebral              artery was not visualized. Subclavians: Left subclavian artery  was stenotic. Normal flow hemodynamics were              seen in the right subclavian artery.  *See table(s) above for measurements and observations.    Electronically signed by Hortencia Pilar MD on 12/16/2021 at 7:16:18 PM.      Final   VAS [place] ABI WITH/WO TBI  LOWER EXTREMITY DOPPLER STUDY  Patient Name:  [name]  Date of Exam:   12/13/2021 Medical Rec #: [id number]        Accession #:    [id number] Date of Birth: 1960-10-13        Patient Gender: M Patient Age:   64 years Exam Location:  [facility] Procedure:      VAS [place] ABI WITH/WO TBI Referring Phys:  --------------------------------------------------------------------------------   Indications: Peripheral artery disease.  Other Factors: 01/29/2019 right atherectomy. sfa-pop stent and pta of eia.  Vascular Interventions: 05/14/13: Right CIA & left EIA stents with right CFA                         PTAs;                         06/17/15: Right CFA PTA;                         01/29/2019 PTA and stent right SFA and popliteal artery.                         PTA right EIA.  Comparison Study:  11/2020  Performing Technologist: Concha Norway RVT    Examination Guidelines: A complete evaluation includes at minimum, Doppler waveform signals and systolic blood pressure reading at the level of bilateral brachial, anterior tibial, and posterior tibial arteries, when vessel segments are accessible. Bilateral testing is considered an integral part of a complete examination. Photoelectric Plethysmograph (PPG) waveforms and toe systolic pressure readings are included as required and additional duplex testing as needed. Limited examinations for reoccurring indications may be performed as noted.    ABI Findings: +---------+------------------+-----+---------+--------+ Right    Rt Pressure (mmHg)IndexWaveform Comment  +---------+------------------+-----+---------+--------+ Brachial 97                                       +---------+------------------+-----+---------+--------+ ATA                             triphasicNonComp  +---------+------------------+-----+---------+--------+ PTA                             triphasicNonComp  +---------+------------------+-----+---------+--------+ [facility]               1.16 Normal            +---------+------------------+-----+---------+--------+  +---------+------------------+-----+--------+-------+ Left     Lt Pressure (mmHg)IndexWaveformComment +---------+------------------+-----+--------+-------+ Brachial 97                                     +---------+------------------+-----+--------+-------+ ATA  NonComp +---------+------------------+-----+--------+-------+ PTA      148               1.53         NonComp +---------+------------------+-----+--------+-------+ Great Toe106               1.09 Normal          +---------+------------------+-----+--------+-------+  +-------+-----------+-----------+------------+------------+ ABI/TBIToday's  ABIToday's TBIPrevious ABIPrevious TBI +-------+-----------+-----------+------------+------------+ Right  NonComp    1.16       NonComp     .80          +-------+-----------+-----------+------------+------------+ Left   NonComp    1.06       1.12        .95          +-------+-----------+-----------+------------+------------+  Bilateral ABIs and TBIs appear essentially unchanged compared to prior study on 11/2020.   Summary: Right: Resting right ankle-brachial index indicates noncompressible right lower extremity arteries. The right toe-brachial index is normal.  Left: Resting left ankle-brachial index indicates noncompressible left lower extremity arteries. The left toe-brachial index is normal.  *See table(s) above for measurements and observations.    Electronically signed by Hortencia Pilar MD on 12/16/2021 at 7:08:34 PM.      Final    Note: Reviewed        Physical Exam  General appearance: Well nourished, well developed, and well hydrated. In no apparent acute distress Mental status: Alert, oriented x 3 (person, place, & time)       Respiratory: No evidence of acute respiratory distress Eyes: PERLA Vitals: BP (!) 145/71   Pulse 93   Temp (!) 97.2 F (36.2 C)   Ht 5' 7" (1.702 m)   Wt 182 lb (82.6 kg)   SpO2 95%   BMI 28.51 kg/m  BMI: Estimated body mass index is 28.51 kg/m as calculated from the following:   Height as of this encounter: 5' 7" (1.702 m).   Weight as of this encounter: 182 lb (82.6 kg). Ideal: Ideal body weight: 66.1 kg (145 lb 11.6 oz) Adjusted ideal body weight: 72.7 kg (160 lb 3.8 oz)    Cervical Spine Area Exam  Skin & Axial Inspection: No masses, redness, edema, swelling, or associated skin lesions Alignment: Symmetrical Functional ROM: Unrestricted ROM      Stability: No instability detected Muscle Tone/Strength: Functionally intact. No obvious neuro-muscular anomalies detected. Sensory (Neurological): Unimpaired   Lower  Extremity Exam    Side: Right lower extremity  Side: Left lower extremity  Stability: No instability observed          Stability: No instability observed          Skin & Extremity Inspection: Skin color, temperature, and hair growth are WNL. No peripheral edema or cyanosis. No masses, redness, swelling, asymmetry, or associated skin lesions. No contractures.  Skin & Extremity Inspection: Skin color, temperature, and hair growth are WNL. No peripheral edema or cyanosis. No masses, redness, swelling, asymmetry, or associated skin lesions. No contractures.  Functional ROM: Unrestricted ROM                  Functional ROM: Unrestricted ROM                  Muscle Tone/Strength: Functionally intact. No obvious neuro-muscular anomalies detected.  Muscle Tone/Strength: Functionally intact. No obvious neuro-muscular anomalies detected.  Sensory (Neurological): Neurogenic pain pattern      and arthropathic  Sensory (Neurological): Neurogenic pain pattern      and arthropathic  DTR: Patellar: deferred today Achilles: deferred today Plantar: deferred today  DTR: Patellar: deferred today Achilles: deferred today Plantar: deferred today  Palpation: No palpable anomalies  Palpation: No palpable anomalies    Assessment   Status Diagnosis  Persistent Persistent Persistent 1. Chronic painful diabetic neuropathy ([place])   2. Chronic right shoulder pain   3. Primary osteoarthritis of right shoulder   4. Congestive heart failure, unspecified HF chronicity, unspecified heart failure type ([place])   5. PAD (peripheral artery disease) ([place])   6. Chronic pain of both shoulders   7. Carotid stenosis, symptomatic w/o infarct, left   8. Type 2 diabetes mellitus with peripheral neuropathy ([facility])   9. Chronic pain syndrome        Plan of Care    Mr. TREMELL REIMERS has a current medication list which includes the following long-term medication(s): albuterol, furosemide, metformin, pravastatin, sodium chloride,  bupropion, carvedilol, hydrocodone-acetaminophen, [START ON 06/30/2022] hydrocodone-acetaminophen, [START ON 07/30/2022] hydrocodone-acetaminophen, and [START ON 08/29/2022] hydrocodone-acetaminophen.     Pharmacotherapy (Medications Ordered): Meds ordered this encounter  Medications   HYDROcodone-acetaminophen (NORCO) 10-325 MG tablet    Sig: Take 1 tablet by mouth every 8 (eight) hours as needed for severe pain. Must last 30 days.    Dispense:  90 tablet    Refill:  0    Chronic Pain: STOP Act (Not applicable) Fill 1 day early if closed on refill date. Avoid benzodiazepines within 8 hours of opioids   HYDROcodone-acetaminophen (NORCO) 10-325 MG tablet    Sig: Take 1 tablet by mouth every 8 (eight) hours as needed for severe pain. Must last 30 days.    Dispense:  90 tablet    Refill:  0    Chronic Pain: STOP Act (Not applicable) Fill 1 day early if closed on refill date. Avoid benzodiazepines within 8 hours of opioids   HYDROcodone-acetaminophen (NORCO) 10-325 MG tablet    Sig: Take 1 tablet by mouth every 8 (eight) hours as needed for severe pain. Must last 30 days.    Dispense:  90 tablet    Refill:  0    Chronic Pain: STOP Act (Not applicable) Fill 1 day early if closed on refill date. Avoid benzodiazepines within 8 hours of opioids   Buprenorphine HCl (BELBUCA) 450 MCG FILM    Sig: Place 1 Film (450 mcg total) inside cheek every 12 (twelve) hours.    Dispense:  60 each    Refill:  2     Patient is tried gabapentin, Lyrica, Cymbalta which resulted in side effects including nausea, cognitive changes which the patient did not like.  Follow-up plan:   Return in about 14 weeks (around 09/27/2022) for Medication Management, in person.     s/p R GH shoulder joint injection 04/03/19, 08/26/19 (anterior approach), 03/23/2020- repeat prn; s/p right axillary nerve PNS         Recent Visits Date Type Provider Dept  04/19/22 Office Visit Gillis Santa, MD [facility]  Showing recent  visits within past 90 days and meeting all other requirements Today's Visits Date Type Provider Dept  06/21/22 Office Visit Gillis Santa, MD [facility]  Showing today's visits and meeting all other requirements Future Appointments No visits were found meeting these conditions. Showing future appointments within next 90 days and meeting all other requirements  I discussed the assessment and treatment plan with the patient. The patient was provided an opportunity to ask questions and all were answered. The patient agreed with the plan  and demonstrated an understanding of the instructions.  Patient advised to call back or seek an in-person evaluation if the symptoms or condition worsens.  Duration of encounter:43mnutes.  Note by: BGillis Santa MD Date: 06/21/2022; Time: 9:58 AM

## 2022-07-12 DIAGNOSIS — Z0000 Encounter for general adult medical examination without abnormal findings: Secondary | ICD-10-CM | POA: Diagnosis not present

## 2022-07-12 DIAGNOSIS — E1165 Type 2 diabetes mellitus with hyperglycemia: Secondary | ICD-10-CM | POA: Diagnosis not present

## 2022-07-12 DIAGNOSIS — I429 Cardiomyopathy, unspecified: Secondary | ICD-10-CM | POA: Diagnosis not present

## 2022-07-12 DIAGNOSIS — D649 Anemia, unspecified: Secondary | ICD-10-CM | POA: Diagnosis not present

## 2022-07-12 DIAGNOSIS — J9611 Chronic respiratory failure with hypoxia: Secondary | ICD-10-CM | POA: Diagnosis not present

## 2022-07-12 DIAGNOSIS — Z951 Presence of aortocoronary bypass graft: Secondary | ICD-10-CM | POA: Diagnosis not present

## 2022-07-12 DIAGNOSIS — I739 Peripheral vascular disease, unspecified: Secondary | ICD-10-CM | POA: Diagnosis not present

## 2022-07-12 DIAGNOSIS — E1140 Type 2 diabetes mellitus with diabetic neuropathy, unspecified: Secondary | ICD-10-CM | POA: Diagnosis not present

## 2022-07-13 DIAGNOSIS — J84112 Idiopathic pulmonary fibrosis: Secondary | ICD-10-CM | POA: Diagnosis not present

## 2022-07-13 DIAGNOSIS — I255 Ischemic cardiomyopathy: Secondary | ICD-10-CM | POA: Diagnosis not present

## 2022-07-13 DIAGNOSIS — J449 Chronic obstructive pulmonary disease, unspecified: Secondary | ICD-10-CM | POA: Diagnosis not present

## 2022-07-13 DIAGNOSIS — H60591 Other noninfective acute otitis externa, right ear: Secondary | ICD-10-CM | POA: Diagnosis not present

## 2022-07-19 DIAGNOSIS — Z7984 Long term (current) use of oral hypoglycemic drugs: Secondary | ICD-10-CM | POA: Diagnosis not present

## 2022-07-19 DIAGNOSIS — Z87891 Personal history of nicotine dependence: Secondary | ICD-10-CM | POA: Diagnosis not present

## 2022-07-19 DIAGNOSIS — E1151 Type 2 diabetes mellitus with diabetic peripheral angiopathy without gangrene: Secondary | ICD-10-CM | POA: Diagnosis not present

## 2022-07-19 DIAGNOSIS — J84112 Idiopathic pulmonary fibrosis: Secondary | ICD-10-CM | POA: Diagnosis not present

## 2022-07-19 DIAGNOSIS — J449 Chronic obstructive pulmonary disease, unspecified: Secondary | ICD-10-CM | POA: Diagnosis not present

## 2022-07-19 DIAGNOSIS — D649 Anemia, unspecified: Secondary | ICD-10-CM | POA: Diagnosis not present

## 2022-07-19 DIAGNOSIS — J9610 Chronic respiratory failure, unspecified whether with hypoxia or hypercapnia: Secondary | ICD-10-CM | POA: Diagnosis not present

## 2022-07-19 DIAGNOSIS — E119 Type 2 diabetes mellitus without complications: Secondary | ICD-10-CM | POA: Diagnosis not present

## 2022-07-19 DIAGNOSIS — I5020 Unspecified systolic (congestive) heart failure: Secondary | ICD-10-CM | POA: Diagnosis not present

## 2022-07-19 DIAGNOSIS — Z951 Presence of aortocoronary bypass graft: Secondary | ICD-10-CM | POA: Diagnosis not present

## 2022-07-19 DIAGNOSIS — Z6830 Body mass index (BMI) 30.0-30.9, adult: Secondary | ICD-10-CM | POA: Diagnosis not present

## 2022-07-26 DIAGNOSIS — I429 Cardiomyopathy, unspecified: Secondary | ICD-10-CM | POA: Diagnosis not present

## 2022-07-26 DIAGNOSIS — I509 Heart failure, unspecified: Secondary | ICD-10-CM | POA: Diagnosis not present

## 2022-07-26 DIAGNOSIS — I5020 Unspecified systolic (congestive) heart failure: Secondary | ICD-10-CM | POA: Diagnosis not present

## 2022-07-28 DIAGNOSIS — L570 Actinic keratosis: Secondary | ICD-10-CM | POA: Diagnosis not present

## 2022-07-28 DIAGNOSIS — X32XXXA Exposure to sunlight, initial encounter: Secondary | ICD-10-CM | POA: Diagnosis not present

## 2022-07-28 DIAGNOSIS — D2272 Melanocytic nevi of left lower limb, including hip: Secondary | ICD-10-CM | POA: Diagnosis not present

## 2022-07-28 DIAGNOSIS — Z85828 Personal history of other malignant neoplasm of skin: Secondary | ICD-10-CM | POA: Diagnosis not present

## 2022-07-28 DIAGNOSIS — D2262 Melanocytic nevi of left upper limb, including shoulder: Secondary | ICD-10-CM | POA: Diagnosis not present

## 2022-07-28 DIAGNOSIS — Z872 Personal history of diseases of the skin and subcutaneous tissue: Secondary | ICD-10-CM | POA: Diagnosis not present

## 2022-07-28 DIAGNOSIS — D2261 Melanocytic nevi of right upper limb, including shoulder: Secondary | ICD-10-CM | POA: Diagnosis not present

## 2022-08-09 DIAGNOSIS — E1142 Type 2 diabetes mellitus with diabetic polyneuropathy: Secondary | ICD-10-CM | POA: Diagnosis not present

## 2022-08-09 DIAGNOSIS — J449 Chronic obstructive pulmonary disease, unspecified: Secondary | ICD-10-CM | POA: Diagnosis not present

## 2022-08-09 DIAGNOSIS — Z951 Presence of aortocoronary bypass graft: Secondary | ICD-10-CM | POA: Diagnosis not present

## 2022-08-09 DIAGNOSIS — I6522 Occlusion and stenosis of left carotid artery: Secondary | ICD-10-CM | POA: Diagnosis not present

## 2022-08-09 DIAGNOSIS — J9611 Chronic respiratory failure with hypoxia: Secondary | ICD-10-CM | POA: Diagnosis not present

## 2022-08-09 DIAGNOSIS — Z01818 Encounter for other preprocedural examination: Secondary | ICD-10-CM | POA: Diagnosis not present

## 2022-08-09 DIAGNOSIS — I429 Cardiomyopathy, unspecified: Secondary | ICD-10-CM | POA: Diagnosis not present

## 2022-08-09 DIAGNOSIS — I5020 Unspecified systolic (congestive) heart failure: Secondary | ICD-10-CM | POA: Diagnosis not present

## 2022-08-09 DIAGNOSIS — I2510 Atherosclerotic heart disease of native coronary artery without angina pectoris: Secondary | ICD-10-CM | POA: Diagnosis not present

## 2022-08-15 DIAGNOSIS — I5022 Chronic systolic (congestive) heart failure: Secondary | ICD-10-CM | POA: Diagnosis not present

## 2022-08-15 DIAGNOSIS — I420 Dilated cardiomyopathy: Secondary | ICD-10-CM | POA: Diagnosis not present

## 2022-08-15 DIAGNOSIS — Z951 Presence of aortocoronary bypass graft: Secondary | ICD-10-CM | POA: Diagnosis not present

## 2022-08-15 DIAGNOSIS — Z95810 Presence of automatic (implantable) cardiac defibrillator: Secondary | ICD-10-CM | POA: Diagnosis not present

## 2022-08-15 DIAGNOSIS — I440 Atrioventricular block, first degree: Secondary | ICD-10-CM | POA: Diagnosis not present

## 2022-08-15 DIAGNOSIS — R918 Other nonspecific abnormal finding of lung field: Secondary | ICD-10-CM | POA: Diagnosis not present

## 2022-08-15 DIAGNOSIS — Z452 Encounter for adjustment and management of vascular access device: Secondary | ICD-10-CM | POA: Diagnosis not present

## 2022-08-15 DIAGNOSIS — E669 Obesity, unspecified: Secondary | ICD-10-CM | POA: Diagnosis not present

## 2022-08-15 DIAGNOSIS — I5020 Unspecified systolic (congestive) heart failure: Secondary | ICD-10-CM | POA: Diagnosis not present

## 2022-08-15 DIAGNOSIS — Z95828 Presence of other vascular implants and grafts: Secondary | ICD-10-CM | POA: Diagnosis not present

## 2022-08-15 DIAGNOSIS — I2510 Atherosclerotic heart disease of native coronary artery without angina pectoris: Secondary | ICD-10-CM | POA: Diagnosis not present

## 2022-08-15 DIAGNOSIS — J449 Chronic obstructive pulmonary disease, unspecified: Secondary | ICD-10-CM | POA: Diagnosis not present

## 2022-08-15 DIAGNOSIS — Z6829 Body mass index (BMI) 29.0-29.9, adult: Secondary | ICD-10-CM | POA: Diagnosis not present

## 2022-08-15 DIAGNOSIS — E1142 Type 2 diabetes mellitus with diabetic polyneuropathy: Secondary | ICD-10-CM | POA: Diagnosis not present

## 2022-08-15 HISTORY — PX: CARDIAC DEFIBRILLATOR PLACEMENT: SHX171

## 2022-08-29 ENCOUNTER — Other Ambulatory Visit: Payer: Self-pay | Admitting: Student in an Organized Health Care Education/Training Program

## 2022-08-29 DIAGNOSIS — I739 Peripheral vascular disease, unspecified: Secondary | ICD-10-CM

## 2022-08-29 DIAGNOSIS — G8929 Other chronic pain: Secondary | ICD-10-CM

## 2022-08-29 DIAGNOSIS — E1140 Type 2 diabetes mellitus with diabetic neuropathy, unspecified: Secondary | ICD-10-CM

## 2022-08-29 DIAGNOSIS — G894 Chronic pain syndrome: Secondary | ICD-10-CM

## 2022-09-22 ENCOUNTER — Encounter: Payer: Self-pay | Admitting: Student in an Organized Health Care Education/Training Program

## 2022-09-22 ENCOUNTER — Ambulatory Visit
Payer: Medicare HMO | Attending: Student in an Organized Health Care Education/Training Program | Admitting: Student in an Organized Health Care Education/Training Program

## 2022-09-22 VITALS — BP 107/71 | HR 81 | Temp 97.2°F | Resp 18 | Ht 67.0 in | Wt 190.0 lb

## 2022-09-22 DIAGNOSIS — E1140 Type 2 diabetes mellitus with diabetic neuropathy, unspecified: Secondary | ICD-10-CM | POA: Diagnosis not present

## 2022-09-22 DIAGNOSIS — M25512 Pain in left shoulder: Secondary | ICD-10-CM | POA: Insufficient documentation

## 2022-09-22 DIAGNOSIS — I509 Heart failure, unspecified: Secondary | ICD-10-CM

## 2022-09-22 DIAGNOSIS — M19011 Primary osteoarthritis, right shoulder: Secondary | ICD-10-CM | POA: Diagnosis not present

## 2022-09-22 DIAGNOSIS — G8929 Other chronic pain: Secondary | ICD-10-CM | POA: Diagnosis not present

## 2022-09-22 DIAGNOSIS — G894 Chronic pain syndrome: Secondary | ICD-10-CM | POA: Diagnosis not present

## 2022-09-22 DIAGNOSIS — M25511 Pain in right shoulder: Secondary | ICD-10-CM | POA: Diagnosis not present

## 2022-09-22 DIAGNOSIS — I739 Peripheral vascular disease, unspecified: Secondary | ICD-10-CM

## 2022-09-22 MED ORDER — HYDROCODONE-ACETAMINOPHEN 10-325 MG PO TABS: 1.0000 | ORAL_TABLET | Freq: Three times a day (TID) | ORAL | 0 refills | Status: DC | PRN

## 2022-09-22 MED ORDER — BELBUCA 450 MCG BU FILM: 1.0000 | ORAL_FILM | Freq: Two times a day (BID) | BUCCAL | 2 refills | Status: DC

## 2022-09-22 NOTE — Progress Notes (Signed)
Nursing Pain Medication Assessment:  Safety precautions to be maintained throughout the outpatient stay will include: orient to surroundings, keep bed in low position, maintain call bell within reach at all times, provide assistance with transfer out of bed and ambulation.  Medication Inspection Compliance: Pill count conducted under aseptic conditions, in front of the patient. Neither the pills nor the bottle was removed from the patient's sight at any time. Once count was completed pills were immediately returned to the patient in their original bottle.  Medication #1: Buprenorphine (Suboxone) Pill/Patch Count:  20 of 60 patches remain Pill/Patch Appearance: Markings consistent with prescribed medication Bottle Appearance: Standard pharmacy container. Clearly labeled. Filled Date: 26 / 06 / 2023 Last Medication intake:  Today  Medication #2: Hydrocodone/APAP Pill/Patch Count:  17 of 90 pills remain Pill/Patch Appearance: Markings consistent with prescribed medication Bottle Appearance: Standard pharmacy container. Clearly labeled. Filled Date: 02 / 05 / 2024 Last Medication intake:  Today

## 2022-09-22 NOTE — Progress Notes (Signed)
PROVIDER NOTE: Information contained herein reflects review and annotations entered in association with encounter. Interpretation of such information and data should be left to medically-trained personnel. Information provided to patient can be located elsewhere in the medical record under "Patient Instructions". Document created using STT-dictation technology, any transcriptional errors that may result from process are unintentional.    Patient: [name]  Service Category: E/M  Provider: Gillis Santa, MD  DOB: Jan 11, 1961  DOS: 09/22/2022  Specialty: Interventional Pain Management  MRN: [id number]  Setting: Ambulatory outpatient  PCP: Tracie Harrier, MD  Type: Established Patient    Referring Provider: Tracie Harrier, MD  Location: Office  Delivery: Face-to-face     HPI  [name], a 62 y.o. year old male, is here today because of his Chronic painful diabetic neuropathy ([place]) [E11.40]. Mr. Zoucha primary complain today is Leg Pain, Joint Pain, and Shoulder Pain (Right)  Last encounter: My last encounter with him was on 06/21/22   Pertinent problems: Mr. Jaquish has PAD (peripheral artery disease) ([place]); Carotid stenosis; Carotid stenosis, symptomatic w/o infarct, left; CAD (coronary artery disease); Chronic right shoulder pain; S/P CABG (coronary artery bypass graft); DM (diabetes mellitus) ([place]); and Chronic pain syndrome on their pertinent problem list. Pain Assessment: Severity of Chronic pain is reported as a 3 /10. Location: Leg Right, Left/thighs to toes bilat. Onset: More than a month ago. Quality: Aching, Burning, Numbness, Sharp, Shooting, Constant. Timing: Constant. Modifying factor(s): meds. Vitals:  height is 5' 7"$  (1.702 m) and weight is 190 lb (86.2 kg). His temporal temperature is 97.2 F (36.2 C) (abnormal). His blood pressure is 107/71 and his pulse is 81. His respiration is 18 and oxygen saturation is 97%.   Reason for encounter: medication management.     -Had a [employer] single chamber AICD device implant on 08/15/22 due to HF and decreased EF, patient states that he is doing fine after his procedure, no complications or issues -continues to have right shoulder pain, worse with abduction- will have patient trial TENS unit, will need clearance from cardiology since he does have an AICD in place -E-prescribing function is down at [facility], will need to print Rx  Pharmacotherapy Assessment  Analgesic: Belbuca 450 mcg BID, Norco 10 mg 3 times daily as needed, quantity 90/month MME equals 30   Monitoring: [place] PMP: PDMP reviewed during this encounter.       Pharmacotherapy: No side-effects or adverse reactions reported. Compliance: No problems identified. Effectiveness: Clinically acceptable.  UDS:  Summary  Date Value Ref Range Status  10/14/2021 Note  Final    Comment:    ==================================================================== [employer] Select 13 (MW) ==================================================================== Test                             Result       Flag       Units  Drug Present and Declared for Prescription Verification   Hydrocodone                    3404         EXPECTED   ng/mg creat   Hydromorphone                  981          EXPECTED   ng/mg creat   Dihydrocodeine                 405  EXPECTED   ng/mg creat   Norhydrocodone                 4328         EXPECTED   ng/mg creat    Sources of hydrocodone include scheduled prescription medications.    Hydromorphone, dihydrocodeine and norhydrocodone are expected    metabolites of hydrocodone. Hydromorphone and dihydrocodeine are    also available as scheduled prescription medications.  ==================================================================== Test                      Result    Flag   Units      Ref Range   Creatinine              57               mg/dL       >=20 ==================================================================== Declared Medications:  The flagging and interpretation on this report are based on the  following declared medications.  Unexpected results may arise from  inaccuracies in the declared medications.   **Note: The testing scope of this panel includes these medications:   Hydrocodone (Norco)   **Note: The testing scope of this panel does not include the  following reported medications:   Acetaminophen (Tylenol)  Acetaminophen (Norco)  Albuterol (Ventolin HFA)  Aspirin  Bupropion (Wellbutrin)  Carvedilol (Coreg)  Furosemide (Lasix)  Metformin (Glucophage)  Pirfenidone (Esbriet)  Pravastatin (Pravachol)  Sodium Chloride  Spironolactone (Aldactone)  Tamsulosin (Flomax) ==================================================================== For clinical consultation, please call [phone]. ====================================================================       ROS  Constitutional: Denies any fever or chills Gastrointestinal: No reported hemesis, hematochezia, vomiting, or acute GI distress Musculoskeletal:  right shoulder pain, + bilateral knee pain Neurological:  Paresthesias of bilateral feet  Medication Review  Buprenorphine HCl, HYDROcodone-acetaminophen, Pirfenidone, acetaminophen, albuterol, aspirin EC, buPROPion, carvedilol, furosemide, metFORMIN, pravastatin, sodium chloride, spironolactone, and tamsulosin  History Review  Allergy: Mr. Moynahan has No Known Allergies. Drug: Mr. Tousant  reports no history of drug use. Alcohol:  reports current alcohol use. Tobacco:  reports that he quit smoking about 8 years ago. His smoking use included cigarettes. He has quit using smokeless tobacco. Social: Mr. Bensman  reports that he quit smoking about 8 years ago. His smoking use included cigarettes. He has quit using smokeless tobacco. He reports current alcohol use. He reports that he does not use  drugs. Medical:  has a past medical history of Cancer ([place]), CHF (congestive heart failure) ([place]), COPD (chronic obstructive pulmonary disease) ([place]), Coronary artery disease, Coronary artery disease, Diabetes mellitus without complication ([place]), Dyspnea, Fibromyalgia, History of kidney stones, IPF (idiopathic pulmonary fibrosis) ([place]), MI (myocardial infarction) ([place]), Peripheral vascular disease ([place]), and Pneumonia. Surgical: Mr. Scheumann  has a past surgical history that includes Cardiac catheterization (N/A, 06/09/2015); Cardiac catheterization (06/09/2015); Coronary artery bypass graft (2015); Cardiac catheterization; Application if wound vac; Wound debridement; stent in leg (Left); Endarterectomy (Left, 09/08/2017); Lower Extremity Angiography (Right, 01/29/2019); and Cardiac defibrillator placement (08/15/2022). Family: family history includes Cancer in his father; Heart attack in his mother.  Laboratory Chemistry Profile   Renal Lab Results  Component Value Date   BUN 42 (H) 12/26/2020   CREATININE 1.70 (H) 12/26/2020   GFRAA >60 01/29/2019   GFRNONAA 45 (L) 12/26/2020     Hepatic Lab Results  Component Value Date   AST 21 08/17/2017   ALT 20 08/17/2017   ALBUMIN 3.7 08/17/2017   ALKPHOS 44 08/17/2017  Electrolytes Lab Results  Component Value Date   NA 133 (L) 12/26/2020   K 4.7 12/26/2020   CL 100 12/26/2020   CALCIUM 9.3 12/26/2020   MG 2.0 07/23/2018   PHOS 4.4 09/10/2017     Bone No results found for: "VD25OH", "VD125OH2TOT", "[id number]", "[id number]", "25OHVITD1", "25OHVITD2", "25OHVITD3", "TESTOFREE", "TESTOSTERONE"   Inflammation (CRP: Acute Phase) (ESR: Chronic Phase) Lab Results  Component Value Date   LATICACIDVEN 1.4 09/09/2017       Note: Above Lab results reviewed.  Recent Imaging Review  VAS US CAROTID Carotid Arterial Duplex Study  Patient Name:  LARAY KAPUSCINSKI  Date of Exam:   12/13/2021 Medical Rec #: [id number]        Accession #:     [id number] Date of Birth: [birth year]        Patient Gender: M Patient Age:   24 years Exam Location:  [facility] Procedure:      VAS US CAROTID Referring Phys: Hortencia Pilar  --------------------------------------------------------------------------------   Indications: Carotid artery disease and left endarterectomy.  Performing Technologist: Concha Norway RVT    Examination Guidelines: A complete evaluation includes B-mode imaging, spectral Doppler, color Doppler, and power Doppler as needed of all accessible portions of each vessel. Bilateral testing is considered an integral part of a complete examination. Limited examinations for reoccurring indications may be performed as noted.    Right Carotid Findings: +----------+--------+--------+--------+------------------+--------+           PSV cm/sEDV cm/sStenosisPlaque DescriptionComments +----------+--------+--------+--------+------------------+--------+ CCA Prox  75      14                                         +----------+--------+--------+--------+------------------+--------+ CCA Mid   76      18                                         +----------+--------+--------+--------+------------------+--------+ CCA Distal70      13                                         +----------+--------+--------+--------+------------------+--------+ ICA Prox  155     33      40-59%  calcific                   +----------+--------+--------+--------+------------------+--------+ ICA Mid   117     26                                         +----------+--------+--------+--------+------------------+--------+ ICA Distal116     26                                         +----------+--------+--------+--------+------------------+--------+ ECA       161     13      >50%    calcific                    +----------+--------+--------+--------+------------------+--------+  +----------+--------+-------+----------------+-------------------+  PSV cm/sEDV cmsDescribe        Arm Pressure (mmHG) +----------+--------+-------+----------------+-------------------+ [id number]            Multiphasic, WNL                    +----------+--------+-------+----------------+-------------------+  +---------+--------+--+--------+--+---------+ VertebralPSV cm/s93EDV cm/s15Antegrade +---------+--------+--+--------+--+---------+     Left Carotid Findings: +----------+--------+--------+--------+------------------+--------+           PSV cm/sEDV cm/sStenosisPlaque DescriptionComments +----------+--------+--------+--------+------------------+--------+ CCA Prox  85      20                                         +----------+--------+--------+--------+------------------+--------+ CCA Mid   79      23                                         +----------+--------+--------+--------+------------------+--------+ CCA Distal84      15                                         +----------+--------+--------+--------+------------------+--------+ ICA Prox  80      21                                         +----------+--------+--------+--------+------------------+--------+ ICA Mid   82      21                                         +----------+--------+--------+--------+------------------+--------+ ICA Distal92      28                                         +----------+--------+--------+--------+------------------+--------+  +----------+--------+--------+----------+-------------------+           PSV cm/sEDV cm/sDescribe  Arm Pressure (mmHG) +----------+--------+--------+----------+-------------------+ [id number]              monophasic                     +----------+--------+--------+----------+-------------------+  +---------+--------+--+--------+--------------+ VertebralPSV cm/s30EDV cm/sNot identified +---------+--------+--+--------+--------------+        Summary: Right Carotid: Velocities in the right ICA are consistent with a 40-59%                stenosis. Non-hemodynamically significant plaque <50% noted in                the CCA. The ECA appears >50% stenosed.  Left Carotid: Velocities in the left ICA are consistent with a 1-39% stenosis.               Non-hemodynamically significant plaque <50% noted in the CCA. The               ECA appears <50% stenosed. Widely patent ICA s/p CEA.  Vertebrals:  Right vertebral artery demonstrates antegrade flow. Left vertebral              artery was not visualized. Subclavians: Left subclavian artery  was stenotic. Normal flow hemodynamics were              seen in the right subclavian artery.  *See table(s) above for measurements and observations.    Electronically signed by Hortencia Pilar MD on 12/16/2021 at 7:16:18 PM.      Final   VAS [place] ABI WITH/WO TBI  LOWER EXTREMITY DOPPLER STUDY  Patient Name:  [name]  Date of Exam:   12/13/2021 Medical Rec #: [id number]        Accession #:    [id number] Date of Birth: 1960-10-13        Patient Gender: M Patient Age:   62 years Exam Location:  [facility] Procedure:      VAS [place] ABI WITH/WO TBI Referring Phys:  --------------------------------------------------------------------------------   Indications: Peripheral artery disease.  Other Factors: 01/29/2019 right atherectomy. sfa-pop stent and pta of eia.  Vascular Interventions: 05/14/13: Right CIA & left EIA stents with right CFA                         PTAs;                         06/17/15: Right CFA PTA;                         01/29/2019 PTA and stent right SFA and popliteal artery.                         PTA right EIA.  Comparison Study:  11/2020  Performing Technologist: Concha Norway RVT    Examination Guidelines: A complete evaluation includes at minimum, Doppler waveform signals and systolic blood pressure reading at the level of bilateral brachial, anterior tibial, and posterior tibial arteries, when vessel segments are accessible. Bilateral testing is considered an integral part of a complete examination. Photoelectric Plethysmograph (PPG) waveforms and toe systolic pressure readings are included as required and additional duplex testing as needed. Limited examinations for reoccurring indications may be performed as noted.    ABI Findings: +---------+------------------+-----+---------+--------+ Right    Rt Pressure (mmHg)IndexWaveform Comment  +---------+------------------+-----+---------+--------+ Brachial 97                                       +---------+------------------+-----+---------+--------+ ATA                             triphasicNonComp  +---------+------------------+-----+---------+--------+ PTA                             triphasicNonComp  +---------+------------------+-----+---------+--------+ [facility]               1.16 Normal            +---------+------------------+-----+---------+--------+  +---------+------------------+-----+--------+-------+ Left     Lt Pressure (mmHg)IndexWaveformComment +---------+------------------+-----+--------+-------+ Brachial 97                                     +---------+------------------+-----+--------+-------+ ATA  NonComp +---------+------------------+-----+--------+-------+ PTA      148               1.53         NonComp +---------+------------------+-----+--------+-------+ Great Toe106               1.09 Normal          +---------+------------------+-----+--------+-------+  +-------+-----------+-----------+------------+------------+ ABI/TBIToday's  ABIToday's TBIPrevious ABIPrevious TBI +-------+-----------+-----------+------------+------------+ Right  NonComp    1.16       NonComp     .80          +-------+-----------+-----------+------------+------------+ Left   NonComp    1.06       1.12        .95          +-------+-----------+-----------+------------+------------+  Bilateral ABIs and TBIs appear essentially unchanged compared to prior study on 11/2020.   Summary: Right: Resting right ankle-brachial index indicates noncompressible right lower extremity arteries. The right toe-brachial index is normal.  Left: Resting left ankle-brachial index indicates noncompressible left lower extremity arteries. The left toe-brachial index is normal.  *See table(s) above for measurements and observations.    Electronically signed by Hortencia Pilar MD on 12/16/2021 at 7:08:34 PM.      Final    Note: Reviewed        Physical Exam  General appearance: Well nourished, well developed, and well hydrated. In no apparent acute distress Mental status: Alert, oriented x 3 (person, place, & time)       Respiratory: No evidence of acute respiratory distress Eyes: PERLA Vitals: BP 107/71   Pulse 81   Temp (!) 97.2 F (36.2 C) (Temporal)   Resp 18   Ht 5' 7"$  (1.702 m)   Wt 190 lb (86.2 kg)   SpO2 97%   BMI 29.76 kg/m  BMI: Estimated body mass index is 29.76 kg/m as calculated from the following:   Height as of this encounter: 5' 7"$  (1.702 m).   Weight as of this encounter: 190 lb (86.2 kg). Ideal: Ideal body weight: 66.1 kg (145 lb 11.6 oz) Adjusted ideal body weight: 74.1 kg (163 lb 6.9 oz)    Cervical Spine Area Exam  Skin & Axial Inspection: No masses, redness, edema, swelling, or associated skin lesions Alignment: Symmetrical Functional ROM: Unrestricted ROM      Stability: No instability detected Muscle Tone/Strength: Functionally intact. No obvious neuro-muscular anomalies detected. Sensory (Neurological):  Unimpaired  Right shoulder pain   Lower Extremity Exam    Side: Right lower extremity  Side: Left lower extremity  Stability: No instability observed          Stability: No instability observed          Skin & Extremity Inspection: Skin color, temperature, and hair growth are WNL. No peripheral edema or cyanosis. No masses, redness, swelling, asymmetry, or associated skin lesions. No contractures.  Skin & Extremity Inspection: Skin color, temperature, and hair growth are WNL. No peripheral edema or cyanosis. No masses, redness, swelling, asymmetry, or associated skin lesions. No contractures.  Functional ROM: Unrestricted ROM                  Functional ROM: Unrestricted ROM                  Muscle Tone/Strength: Functionally intact. No obvious neuro-muscular anomalies detected.  Muscle Tone/Strength: Functionally intact. No obvious neuro-muscular anomalies detected.  Sensory (Neurological): Neurogenic pain pattern      and arthropathic  Sensory (Neurological): Neurogenic pain  pattern      and arthropathic  DTR: Patellar: deferred today Achilles: deferred today Plantar: deferred today  DTR: Patellar: deferred today Achilles: deferred today Plantar: deferred today  Palpation: No palpable anomalies  Palpation: No palpable anomalies    Assessment   Status Diagnosis  Persistent Persistent Persistent 1. Chronic painful diabetic neuropathy ([place])   2. Chronic right shoulder pain   3. Primary osteoarthritis of right shoulder   4. Congestive heart failure, unspecified HF chronicity, unspecified heart failure type ([place])   5. PAD (peripheral artery disease) ([place])   6. Chronic pain of both shoulders   7. Chronic pain syndrome         Plan of Care    Mr. AFFAN HARMSEN has a current medication list which includes the following long-term medication(s): albuterol, carvedilol, furosemide, metformin, pravastatin, sodium chloride, bupropion, [START ON 10/27/2022] hydrocodone-acetaminophen,  [START ON 11/26/2022] hydrocodone-acetaminophen, and [START ON 09/27/2022] hydrocodone-acetaminophen.     Pharmacotherapy (Medications Ordered): Meds ordered this encounter  Medications   DISCONTD: HYDROcodone-acetaminophen (NORCO) 10-325 MG tablet    Sig: Take 1 tablet by mouth every 8 (eight) hours as needed for severe pain. Must last 30 days.    Dispense:  90 tablet    Refill:  0    Chronic Pain. (STOP Act - Not applicable). Fill one day early if closed on scheduled refill date.   DISCONTD: Buprenorphine HCl (BELBUCA) 450 MCG FILM    Sig: Place 1 Film (450 mcg total) inside cheek every 12 (twelve) hours.    Dispense:  60 each    Refill:  2   DISCONTD: HYDROcodone-acetaminophen (NORCO) 10-325 MG tablet    Sig: Take 1 tablet by mouth every 8 (eight) hours as needed for severe pain. Must last 30 days.    Dispense:  90 tablet    Refill:  0    Chronic Pain. (STOP Act - Not applicable). Fill one day early if closed on scheduled refill date.   DISCONTD: HYDROcodone-acetaminophen (NORCO) 10-325 MG tablet    Sig: Take 1 tablet by mouth every 8 (eight) hours as needed for severe pain. Must last 30 days.    Dispense:  90 tablet    Refill:  0    Chronic Pain. (STOP Act - Not applicable). Fill one day early if closed on scheduled refill date.   HYDROcodone-acetaminophen (NORCO) 10-325 MG tablet    Sig: Take 1 tablet by mouth every 8 (eight) hours as needed for severe pain. Must last 30 days.    Dispense:  90 tablet    Refill:  0    Chronic Pain. (STOP Act - Not applicable). Fill one day early if closed on scheduled refill date.   HYDROcodone-acetaminophen (NORCO) 10-325 MG tablet    Sig: Take 1 tablet by mouth every 8 (eight) hours as needed for severe pain. Must last 30 days.    Dispense:  90 tablet    Refill:  0    Chronic Pain. (STOP Act - Not applicable). Fill one day early if closed on scheduled refill date.   HYDROcodone-acetaminophen (NORCO) 10-325 MG tablet    Sig: Take 1 tablet by  mouth every 8 (eight) hours as needed for severe pain. Must last 30 days.    Dispense:  90 tablet    Refill:  0    Chronic Pain. (STOP Act - Not applicable). Fill one day early if closed on scheduled refill date.   Buprenorphine HCl (BELBUCA) 450 MCG FILM    Sig: Place  1 Film (450 mcg total) inside cheek every 12 (twelve) hours.    Dispense:  60 each    Refill:  2     Patient has tried gabapentin, Lyrica, Cymbalta which resulted in side effects including nausea, cognitive changes which the patient did not like.  Follow-up plan:   Return in about 3 months (around 12/21/2022) for Medication Management, in person.     s/p R GH shoulder joint injection 04/03/19, 08/26/19 (anterior approach), 03/23/2020- repeat prn; s/p right axillary nerve PNS         Recent Visits No visits were found meeting these conditions. Showing recent visits within past 90 days and meeting all other requirements Today's Visits Date Type Provider Dept  09/22/22 Office Visit Gillis Santa, MD [facility]  Showing today's visits and meeting all other requirements Future Appointments No visits were found meeting these conditions. Showing future appointments within next 90 days and meeting all other requirements  I discussed the assessment and treatment plan with the patient. The patient was provided an opportunity to ask questions and all were answered. The patient agreed with the plan and demonstrated an understanding of the instructions.  Patient advised to call back or seek an in-person evaluation if the symptoms or condition worsens.  Duration of encounter:75mnutes.  Note by: BGillis Santa MD Date: 09/22/2022; Time: 10:22 AM

## 2022-10-11 ENCOUNTER — Other Ambulatory Visit
Admission: RE | Admit: 2022-10-11 | Discharge: 2022-10-11 | Disposition: A | Discharge status: Home / Self Care | Payer: Medicare HMO | Source: Ambulatory Visit | Attending: Physician Assistant | Admitting: Physician Assistant

## 2022-10-11 DIAGNOSIS — R609 Edema, unspecified: Secondary | ICD-10-CM | POA: Insufficient documentation

## 2022-10-11 DIAGNOSIS — Z951 Presence of aortocoronary bypass graft: Secondary | ICD-10-CM | POA: Diagnosis not present

## 2022-10-11 DIAGNOSIS — I5020 Unspecified systolic (congestive) heart failure: Secondary | ICD-10-CM | POA: Insufficient documentation

## 2022-10-11 DIAGNOSIS — Z23 Encounter for immunization: Secondary | ICD-10-CM | POA: Diagnosis not present

## 2022-10-11 DIAGNOSIS — I4891 Unspecified atrial fibrillation: Secondary | ICD-10-CM | POA: Diagnosis not present

## 2022-10-11 DIAGNOSIS — R0609 Other forms of dyspnea: Secondary | ICD-10-CM | POA: Insufficient documentation

## 2022-10-11 DIAGNOSIS — I9789 Other postprocedural complications and disorders of the circulatory system, not elsewhere classified: Secondary | ICD-10-CM | POA: Diagnosis not present

## 2022-10-11 DIAGNOSIS — I2510 Atherosclerotic heart disease of native coronary artery without angina pectoris: Secondary | ICD-10-CM | POA: Diagnosis not present

## 2022-10-11 LAB — BRAIN NATRIURETIC PEPTIDE: B Natriuretic Peptide: 596.8 pg/mL — ABNORMAL HIGH (ref 0.0–100.0)

## 2022-11-08 DIAGNOSIS — I255 Ischemic cardiomyopathy: Secondary | ICD-10-CM | POA: Diagnosis not present

## 2022-11-08 DIAGNOSIS — R7981 Abnormal blood-gas level: Secondary | ICD-10-CM | POA: Diagnosis not present

## 2022-11-08 DIAGNOSIS — J449 Chronic obstructive pulmonary disease, unspecified: Secondary | ICD-10-CM | POA: Diagnosis not present

## 2022-11-08 DIAGNOSIS — J849 Interstitial pulmonary disease, unspecified: Secondary | ICD-10-CM | POA: Diagnosis not present

## 2022-11-08 DIAGNOSIS — Z6841 Body Mass Index (BMI) 40.0 and over, adult: Secondary | ICD-10-CM | POA: Diagnosis not present

## 2022-11-08 DIAGNOSIS — R0602 Shortness of breath: Secondary | ICD-10-CM | POA: Diagnosis not present

## 2022-11-08 DIAGNOSIS — J90 Pleural effusion, not elsewhere classified: Secondary | ICD-10-CM | POA: Diagnosis not present

## 2022-11-09 DIAGNOSIS — I429 Cardiomyopathy, unspecified: Secondary | ICD-10-CM | POA: Diagnosis not present

## 2022-11-09 DIAGNOSIS — I519 Heart disease, unspecified: Secondary | ICD-10-CM | POA: Diagnosis not present

## 2022-11-09 DIAGNOSIS — J9611 Chronic respiratory failure with hypoxia: Secondary | ICD-10-CM | POA: Diagnosis not present

## 2022-11-09 DIAGNOSIS — E1165 Type 2 diabetes mellitus with hyperglycemia: Secondary | ICD-10-CM | POA: Diagnosis not present

## 2022-11-09 DIAGNOSIS — Z6830 Body mass index (BMI) 30.0-30.9, adult: Secondary | ICD-10-CM | POA: Diagnosis not present

## 2022-11-09 DIAGNOSIS — R808 Other proteinuria: Secondary | ICD-10-CM | POA: Diagnosis not present

## 2022-11-09 DIAGNOSIS — J432 Centrilobular emphysema: Secondary | ICD-10-CM | POA: Diagnosis not present

## 2022-11-09 DIAGNOSIS — D649 Anemia, unspecified: Secondary | ICD-10-CM | POA: Diagnosis not present

## 2022-11-09 DIAGNOSIS — Z951 Presence of aortocoronary bypass graft: Secondary | ICD-10-CM | POA: Diagnosis not present

## 2022-11-15 DIAGNOSIS — Z951 Presence of aortocoronary bypass graft: Secondary | ICD-10-CM | POA: Diagnosis not present

## 2022-11-15 DIAGNOSIS — J9610 Chronic respiratory failure, unspecified whether with hypoxia or hypercapnia: Secondary | ICD-10-CM | POA: Diagnosis not present

## 2022-11-15 DIAGNOSIS — E669 Obesity, unspecified: Secondary | ICD-10-CM | POA: Diagnosis not present

## 2022-11-15 DIAGNOSIS — Z1331 Encounter for screening for depression: Secondary | ICD-10-CM | POA: Diagnosis not present

## 2022-11-15 DIAGNOSIS — R5383 Other fatigue: Secondary | ICD-10-CM | POA: Diagnosis not present

## 2022-11-15 DIAGNOSIS — M19019 Primary osteoarthritis, unspecified shoulder: Secondary | ICD-10-CM | POA: Diagnosis not present

## 2022-11-15 DIAGNOSIS — Z6829 Body mass index (BMI) 29.0-29.9, adult: Secondary | ICD-10-CM | POA: Diagnosis not present

## 2022-11-15 DIAGNOSIS — J84112 Idiopathic pulmonary fibrosis: Secondary | ICD-10-CM | POA: Diagnosis not present

## 2022-11-15 DIAGNOSIS — E1151 Type 2 diabetes mellitus with diabetic peripheral angiopathy without gangrene: Secondary | ICD-10-CM | POA: Diagnosis not present

## 2022-11-15 DIAGNOSIS — Z0000 Encounter for general adult medical examination without abnormal findings: Secondary | ICD-10-CM | POA: Diagnosis not present

## 2022-11-15 DIAGNOSIS — I5020 Unspecified systolic (congestive) heart failure: Secondary | ICD-10-CM | POA: Diagnosis not present

## 2022-11-15 DIAGNOSIS — M25569 Pain in unspecified knee: Secondary | ICD-10-CM | POA: Diagnosis not present

## 2022-11-28 ENCOUNTER — Inpatient Hospital Stay
Admission: EM | Admit: 2022-11-28 | Discharge: 2022-12-07 | DRG: 286 | Disposition: A | Discharge status: Home / Self Care | Payer: Medicare HMO | Attending: Internal Medicine | Admitting: Internal Medicine

## 2022-11-28 ENCOUNTER — Emergency Department: Payer: Medicare HMO

## 2022-11-28 ENCOUNTER — Other Ambulatory Visit: Payer: Self-pay

## 2022-11-28 DIAGNOSIS — M797 Fibromyalgia: Secondary | ICD-10-CM | POA: Diagnosis present

## 2022-11-28 DIAGNOSIS — R0602 Shortness of breath: Secondary | ICD-10-CM | POA: Diagnosis not present

## 2022-11-28 DIAGNOSIS — J84112 Idiopathic pulmonary fibrosis: Secondary | ICD-10-CM | POA: Diagnosis not present

## 2022-11-28 DIAGNOSIS — R570 Cardiogenic shock: Secondary | ICD-10-CM | POA: Diagnosis present

## 2022-11-28 DIAGNOSIS — U099 Post covid-19 condition, unspecified: Secondary | ICD-10-CM | POA: Diagnosis present

## 2022-11-28 DIAGNOSIS — E871 Hypo-osmolality and hyponatremia: Secondary | ICD-10-CM | POA: Diagnosis present

## 2022-11-28 DIAGNOSIS — Z9981 Dependence on supplemental oxygen: Secondary | ICD-10-CM

## 2022-11-28 DIAGNOSIS — E1151 Type 2 diabetes mellitus with diabetic peripheral angiopathy without gangrene: Secondary | ICD-10-CM | POA: Diagnosis present

## 2022-11-28 DIAGNOSIS — Z95810 Presence of automatic (implantable) cardiac defibrillator: Secondary | ICD-10-CM | POA: Diagnosis not present

## 2022-11-28 DIAGNOSIS — J9621 Acute and chronic respiratory failure with hypoxia: Secondary | ICD-10-CM | POA: Diagnosis present

## 2022-11-28 DIAGNOSIS — I272 Pulmonary hypertension, unspecified: Secondary | ICD-10-CM | POA: Diagnosis not present

## 2022-11-28 DIAGNOSIS — I5023 Acute on chronic systolic (congestive) heart failure: Secondary | ICD-10-CM | POA: Diagnosis not present

## 2022-11-28 DIAGNOSIS — I472 Ventricular tachycardia, unspecified: Secondary | ICD-10-CM | POA: Diagnosis not present

## 2022-11-28 DIAGNOSIS — I110 Hypertensive heart disease with heart failure: Secondary | ICD-10-CM | POA: Diagnosis not present

## 2022-11-28 DIAGNOSIS — R079 Chest pain, unspecified: Secondary | ICD-10-CM | POA: Diagnosis not present

## 2022-11-28 DIAGNOSIS — K219 Gastro-esophageal reflux disease without esophagitis: Secondary | ICD-10-CM | POA: Diagnosis present

## 2022-11-28 DIAGNOSIS — J449 Chronic obstructive pulmonary disease, unspecified: Secondary | ICD-10-CM | POA: Diagnosis present

## 2022-11-28 DIAGNOSIS — I70202 Unspecified atherosclerosis of native arteries of extremities, left leg: Secondary | ICD-10-CM | POA: Diagnosis present

## 2022-11-28 DIAGNOSIS — N179 Acute kidney failure, unspecified: Secondary | ICD-10-CM | POA: Diagnosis not present

## 2022-11-28 DIAGNOSIS — I739 Peripheral vascular disease, unspecified: Secondary | ICD-10-CM | POA: Diagnosis present

## 2022-11-28 DIAGNOSIS — E1122 Type 2 diabetes mellitus with diabetic chronic kidney disease: Secondary | ICD-10-CM | POA: Diagnosis present

## 2022-11-28 DIAGNOSIS — K7460 Unspecified cirrhosis of liver: Secondary | ICD-10-CM | POA: Diagnosis not present

## 2022-11-28 DIAGNOSIS — R188 Other ascites: Secondary | ICD-10-CM | POA: Diagnosis not present

## 2022-11-28 DIAGNOSIS — J810 Acute pulmonary edema: Secondary | ICD-10-CM | POA: Diagnosis not present

## 2022-11-28 DIAGNOSIS — K8020 Calculus of gallbladder without cholecystitis without obstruction: Secondary | ICD-10-CM | POA: Diagnosis not present

## 2022-11-28 DIAGNOSIS — I429 Cardiomyopathy, unspecified: Secondary | ICD-10-CM | POA: Diagnosis not present

## 2022-11-28 DIAGNOSIS — I129 Hypertensive chronic kidney disease with stage 1 through stage 4 chronic kidney disease, or unspecified chronic kidney disease: Secondary | ICD-10-CM | POA: Diagnosis not present

## 2022-11-28 DIAGNOSIS — Z951 Presence of aortocoronary bypass graft: Secondary | ICD-10-CM

## 2022-11-28 DIAGNOSIS — E785 Hyperlipidemia, unspecified: Secondary | ICD-10-CM | POA: Diagnosis present

## 2022-11-28 DIAGNOSIS — E1165 Type 2 diabetes mellitus with hyperglycemia: Secondary | ICD-10-CM | POA: Diagnosis present

## 2022-11-28 DIAGNOSIS — Z87891 Personal history of nicotine dependence: Secondary | ICD-10-CM

## 2022-11-28 DIAGNOSIS — R748 Abnormal levels of other serum enzymes: Secondary | ICD-10-CM | POA: Diagnosis not present

## 2022-11-28 DIAGNOSIS — E119 Type 2 diabetes mellitus without complications: Secondary | ICD-10-CM | POA: Diagnosis not present

## 2022-11-28 DIAGNOSIS — Z95820 Peripheral vascular angioplasty status with implants and grafts: Secondary | ICD-10-CM

## 2022-11-28 DIAGNOSIS — I252 Old myocardial infarction: Secondary | ICD-10-CM

## 2022-11-28 DIAGNOSIS — G8929 Other chronic pain: Secondary | ICD-10-CM | POA: Diagnosis present

## 2022-11-28 DIAGNOSIS — I130 Hypertensive heart and chronic kidney disease with heart failure and stage 1 through stage 4 chronic kidney disease, or unspecified chronic kidney disease: Principal | ICD-10-CM | POA: Diagnosis present

## 2022-11-28 DIAGNOSIS — E669 Obesity, unspecified: Secondary | ICD-10-CM | POA: Diagnosis present

## 2022-11-28 DIAGNOSIS — K761 Chronic passive congestion of liver: Secondary | ICD-10-CM | POA: Diagnosis not present

## 2022-11-28 DIAGNOSIS — N1832 Chronic kidney disease, stage 3b: Secondary | ICD-10-CM | POA: Diagnosis present

## 2022-11-28 DIAGNOSIS — I509 Heart failure, unspecified: Secondary | ICD-10-CM

## 2022-11-28 DIAGNOSIS — I5043 Acute on chronic combined systolic (congestive) and diastolic (congestive) heart failure: Secondary | ICD-10-CM | POA: Diagnosis not present

## 2022-11-28 DIAGNOSIS — Z7982 Long term (current) use of aspirin: Secondary | ICD-10-CM

## 2022-11-28 DIAGNOSIS — N1831 Chronic kidney disease, stage 3a: Secondary | ICD-10-CM | POA: Diagnosis not present

## 2022-11-28 DIAGNOSIS — I4510 Unspecified right bundle-branch block: Secondary | ICD-10-CM | POA: Diagnosis present

## 2022-11-28 DIAGNOSIS — R809 Proteinuria, unspecified: Secondary | ICD-10-CM | POA: Diagnosis not present

## 2022-11-28 DIAGNOSIS — Z79899 Other long term (current) drug therapy: Secondary | ICD-10-CM

## 2022-11-28 DIAGNOSIS — R0600 Dyspnea, unspecified: Secondary | ICD-10-CM | POA: Diagnosis not present

## 2022-11-28 DIAGNOSIS — Z6830 Body mass index (BMI) 30.0-30.9, adult: Secondary | ICD-10-CM

## 2022-11-28 DIAGNOSIS — Z87442 Personal history of urinary calculi: Secondary | ICD-10-CM

## 2022-11-28 DIAGNOSIS — I2510 Atherosclerotic heart disease of native coronary artery without angina pectoris: Secondary | ICD-10-CM | POA: Diagnosis present

## 2022-11-28 DIAGNOSIS — Z7984 Long term (current) use of oral hypoglycemic drugs: Secondary | ICD-10-CM

## 2022-11-28 DIAGNOSIS — I5022 Chronic systolic (congestive) heart failure: Secondary | ICD-10-CM | POA: Diagnosis not present

## 2022-11-28 DIAGNOSIS — Z8249 Family history of ischemic heart disease and other diseases of the circulatory system: Secondary | ICD-10-CM

## 2022-11-28 DIAGNOSIS — Z85828 Personal history of other malignant neoplasm of skin: Secondary | ICD-10-CM

## 2022-11-28 DIAGNOSIS — J9601 Acute respiratory failure with hypoxia: Secondary | ICD-10-CM | POA: Diagnosis not present

## 2022-11-28 DIAGNOSIS — I25810 Atherosclerosis of coronary artery bypass graft(s) without angina pectoris: Secondary | ICD-10-CM | POA: Diagnosis not present

## 2022-11-28 DIAGNOSIS — J90 Pleural effusion, not elsewhere classified: Secondary | ICD-10-CM | POA: Diagnosis not present

## 2022-11-28 LAB — CBC
HCT: 39.6 % (ref 39.0–52.0)
Hemoglobin: 12.2 g/dL — ABNORMAL LOW (ref 13.0–17.0)
MCH: 30.2 pg (ref 26.0–34.0)
MCHC: 30.8 g/dL (ref 30.0–36.0)
MCV: 98 fL (ref 80.0–100.0)
Platelets: 185 10*3/uL (ref 150–400)
RBC: 4.04 MIL/uL — ABNORMAL LOW (ref 4.22–5.81)
RDW: 15 % (ref 11.5–15.5)
WBC: 9.3 10*3/uL (ref 4.0–10.5)
nRBC: 0 % (ref 0.0–0.2)

## 2022-11-28 LAB — COMPREHENSIVE METABOLIC PANEL
ALT: 177 U/L — ABNORMAL HIGH (ref 0–44)
AST: 42 U/L — ABNORMAL HIGH (ref 15–41)
Albumin: 3.3 g/dL — ABNORMAL LOW (ref 3.5–5.0)
Alkaline Phosphatase: 67 U/L (ref 38–126)
Anion gap: 14 (ref 5–15)
BUN: UNDETERMINED mg/dL (ref 8–23)
CO2: 23 mmol/L (ref 22–32)
Calcium: 8.3 mg/dL — ABNORMAL LOW (ref 8.9–10.3)
Chloride: 93 mmol/L — ABNORMAL LOW (ref 98–111)
Creatinine, Ser: UNDETERMINED mg/dL (ref 0.61–1.24)
Glucose, Bld: 256 mg/dL — ABNORMAL HIGH (ref 70–99)
Potassium: 4.5 mmol/L (ref 3.5–5.1)
Sodium: 130 mmol/L — ABNORMAL LOW (ref 135–145)
Total Bilirubin: 1.5 mg/dL — ABNORMAL HIGH (ref 0.3–1.2)
Total Protein: 7.5 g/dL (ref 6.5–8.1)

## 2022-11-28 LAB — TROPONIN I (HIGH SENSITIVITY)
Troponin I (High Sensitivity): 19 ng/L — ABNORMAL HIGH (ref <18)
Troponin I (High Sensitivity): 17 ng/L (ref <18)

## 2022-11-28 LAB — CREATININE, SERUM
Creatinine, Ser: 1.53 mg/dL — ABNORMAL HIGH (ref 0.61–1.24)
GFR, Estimated: 51 mL/min — ABNORMAL LOW (ref >60)

## 2022-11-28 LAB — BRAIN NATRIURETIC PEPTIDE: B Natriuretic Peptide: 1280.6 pg/mL — ABNORMAL HIGH (ref 0.0–100.0)

## 2022-11-28 LAB — BUN: BUN: 53 mg/dL — ABNORMAL HIGH (ref 8–23)

## 2022-11-28 MED ORDER — POLYETHYLENE GLYCOL 3350 17 G PO PACK
17.0000 g | PACK | Freq: Every day | ORAL | Status: DC | PRN
  Administered 2022-12-03: 17 g via ORAL
  Filled 2022-11-28 (×2): qty 1

## 2022-11-28 MED ORDER — NOREPINEPHRINE 4 MG/250ML-% IV SOLN
0.0000 ug/min | INTRAVENOUS | Status: DC
  Administered 2022-11-28: 2 ug/min via INTRAVENOUS
  Filled 2022-11-28: qty 250

## 2022-11-28 MED ORDER — FUROSEMIDE 10 MG/ML IJ SOLN
40.0000 mg | Freq: Once | INTRAMUSCULAR | Status: AC
  Administered 2022-11-28: 40 mg via INTRAVENOUS
  Filled 2022-11-28: qty 4

## 2022-11-28 MED ORDER — ENOXAPARIN SODIUM 40 MG/0.4ML IJ SOSY
40.0000 mg | PREFILLED_SYRINGE | INTRAMUSCULAR | Status: DC
  Administered 2022-11-29 – 2022-12-06 (×8): 40 mg via SUBCUTANEOUS
  Filled 2022-11-28 (×8): qty 0.4

## 2022-11-28 MED ORDER — SODIUM CHLORIDE 0.9 % IV BOLUS
1000.0000 mL | Freq: Once | INTRAVENOUS | Status: DC
  Administered 2022-11-28: 1000 mL via INTRAVENOUS

## 2022-11-28 MED ORDER — DOCUSATE SODIUM 100 MG PO CAPS
100.0000 mg | ORAL_CAPSULE | Freq: Two times a day (BID) | ORAL | Status: DC | PRN
  Administered 2022-12-04: 100 mg via ORAL
  Filled 2022-11-28: qty 1

## 2022-11-28 NOTE — ED Notes (Signed)
Pt states has been feeling weak and dehydrated for several days and gained 3# yesterday, hx CHF. Has been using oxygen more often at home since last 2 months or so. Getting out of breath easily. BP low in triage. Pt denies dizziness. Retaking BP now. EKG was handed to EDP Bradler.

## 2022-11-28 NOTE — ED Provider Notes (Signed)
[facility] Provider Note   Event Date/Time   First MD Initiated Contact with Patient 11/28/22 1558     (approximate) History  Shortness of Breath  HPI [name] is a 62 y.o. male with a stated past medical history of CHF who presents complaining of worsening shortness of breath and needing to use his home oxygen at 2 L over the last week of increasing weight and edema on his lower extremities.  Patient states that he has been taking his metolazone as needed as prescribed with continued worsening of his edema.  Patient also complaining of worsening shortness of breath with exertion. ROS: Patient currently denies any vision changes, tinnitus, difficulty speaking, facial droop, sore throat, chest pain, abdominal pain, nausea/vomiting/diarrhea, dysuria, or weakness/numbness/paresthesias in any extremity   Physical Exam  Triage Vital Signs: ED Triage Vitals  Enc Vitals Group     BP 11/28/22 1551 (!) 76/30     Pulse Rate 11/28/22 1551 86     Resp 11/28/22 1551 16     Temp 11/28/22 1551 97.8 F (36.6 C)     Temp Source 11/28/22 1551 Oral     SpO2 11/28/22 1551 (!) 89 %     Weight 11/28/22 1552 198 lb (89.8 kg)     Height --      Head Circumference --      Peak Flow --      Pain Score 11/28/22 1549 0     Pain Loc --      Pain Edu? --      Excl. in GC? --    Most recent vital signs: Vitals:   11/28/22 2115 11/28/22 2130  BP: 93/71 96/69  Pulse: 100 100  Resp: (!) 0 16  Temp:    SpO2: 97% 96%   General: Awake, oriented x4. CV:  Good peripheral perfusion.  Resp:  Normal effort.  Rales over bilateral lung fields with 3 L nasal cannula in place Abd:  No distention.  Other:  Middle-aged overweight Caucasian male laying in bed in no acute distress ED Results / Procedures / Treatments  Labs (all labs ordered are listed, but only abnormal results are displayed) Labs Reviewed  CBC - Abnormal; Notable for the following components:      Result Value    RBC 4.04 (*)    Hemoglobin 12.2 (*)    All other components within normal limits  COMPREHENSIVE METABOLIC PANEL - Abnormal; Notable for the following components:   Sodium 130 (*)    Chloride 93 (*)    Glucose, Bld 256 (*)    Calcium 8.3 (*)    Albumin 3.3 (*)    AST 42 (*)    ALT 177 (*)    Total Bilirubin 1.5 (*)    All other components within normal limits  BRAIN NATRIURETIC PEPTIDE - Abnormal; Notable for the following components:   B Natriuretic Peptide 1,280.6 (*)    All other components within normal limits  BUN - Abnormal; Notable for the following components:   BUN 53 (*)    All other components within normal limits  CREATININE, SERUM - Abnormal; Notable for the following components:   Creatinine, Ser 1.53 (*)    GFR, Estimated 51 (*)    All other components within normal limits  TROPONIN I (HIGH SENSITIVITY) - Abnormal; Notable for the following components:   Troponin I (High Sensitivity) 19 (*)    All other components within normal limits  TROPONIN I (HIGH SENSITIVITY)  EKG ED ECG REPORT I, Merwyn Katos, the attending physician, personally viewed and interpreted this ECG. Date: 11/28/2022 EKG Time: 1555 Rate: 85 Rhythm: normal sinus rhythm QRS Axis: normal Intervals: Right bundle branch block with occasional PVC ST/T Wave abnormalities: normal Narrative Interpretation: Normal sinus rhythm with right bundle branch block and occasional PVCs.  No evidence of acute ischemia RADIOLOGY ED MD interpretation: 2 view chest x-ray shows enlarged heart with congestion and interstitial edema as well as tiny pleural effusions with fibrillator in place -Agree with radiology assessment Official radiology report(s): DG Chest 2 View  Result Date: 11/28/2022 CLINICAL DATA:  Chest pain EXAM: CHEST - 2 VIEW COMPARISON:  X-ray 12/26/2020.  Report of a x-ray 11/08/2022 FINDINGS: Status post median sternotomy. Left upper chest defibrillator with leads along the right side of the  enlarged heart. Increasing interstitial changes, possibly edema. Small pleural effusions. No pneumothorax. Overlapping cardiac leads. Osteopenia. IMPRESSION: Status post median sternotomy. Enlarged heart with vascular congestion and interstitial edema. Tiny pleural effusions. Defibrillator.  Recommend follow-up Electronically Signed   By: Karen Kays M.D.   On: 11/28/2022 16:46   PROCEDURES: Critical Care performed: Yes, see critical care procedure note(s) .1-3 Lead EKG Interpretation  Performed by: Merwyn Katos, MD Authorized by: Merwyn Katos, MD     Interpretation: normal     ECG rate:  91   ECG rate assessment: normal     Rhythm: sinus rhythm     Ectopy: PVCs     Conduction: normal   Comments:     Right bundle branch block CRITICAL CARE Performed by: Merwyn Katos  Total critical care time: 33 minutes  Critical care time was exclusive of separately billable procedures and treating other patients.  Critical care was necessary to treat or prevent imminent or life-threatening deterioration.  Critical care was time spent personally by me on the following activities: development of treatment plan with patient and/or surrogate as well as nursing, discussions with consultants, evaluation of patient's response to treatment, examination of patient, obtaining history from patient or surrogate, ordering and performing treatments and interventions, ordering and review of laboratory studies, ordering and review of radiographic studies, pulse oximetry and re-evaluation of patient's condition.  MEDICATIONS ORDERED IN ED: Medications  norepinephrine (LEVOPHED) 4mg  in (0.016 mg/mL) premix infusion (3 mcg/min Intravenous Rate/Dose Change 11/28/22 2128)  furosemide (LASIX) injection 40 mg (40 mg Intravenous Given 11/28/22 1738)   IMPRESSION / MDM / ASSESSMENT AND PLAN / ED COURSE  I reviewed the triage vital signs and the nursing notes.                             The patient is on the  cardiac monitor to evaluate for evidence of arrhythmia and/or significant heart rate changes. Patient's presentation is most consistent with acute presentation with potential threat to life or bodily function. Endorses dyspnea, endorses LE edema denies Non adherence to medication regimen  Workup: ECG, CBC, BMP, Troponin, BNP, CXR Findings: EKG: No STEMI and no evidence of Brugadas sign, delta wave, epsilon wave, significantly prolonged QTc, or malignant arrhythmia. BNP: 1280 CXR: Pulmonary edema with tiny bilateral pleural effusions Based on history, exam and findings, presentation most consistent with acute on chronic heart failure. Low suspicion for PNA, ACS, tamponade, aortic dissection. Interventions: Oxygen, Diuresis  Reassessment: Symptoms improved in ED with oxygen and diuresis however patient did develop significant hypotension requiring Levophed  Disposition (Stable but not significantly improved):  Admit to ICU for further monitoring and for improvement of medication regimen to control symptoms.  FINAL CLINICAL IMPRESSION(S) / ED DIAGNOSES   Final diagnoses:  Acute pulmonary edema (HCC)  SOB (shortness of breath)  Acute on chronic congestive heart failure, unspecified heart failure type (HCC)  Acute hypoxic respiratory failure (HCC)   Rx / DC Orders   ED Discharge Orders     None      Note:  This document was prepared using Dragon voice recognition software and may include unintentional dictation errors.   Merwyn Katos, MD 11/28/22 2139

## 2022-11-28 NOTE — H&P (Incomplete)
NAME:  [name], MRN:  [id number], DOB:  August 19, 1960, LOS: 0 ADMISSION DATE:  11/28/2022, CONSULTATION DATE:  11/28/22 REFERRING MD:  Dr. Vicente Males, CHIEF COMPLAINT:  shortness of breath   History of Present Illness:  62 yo M presenting to [facility] ED from home for evaluation of progressive shortness of breath, weight gain and bilateral lower extremity edema.  History provided by patient and spouse bedside. They describe that since January 2024 when his defibrillator was placed he has been declining from a respiratory standpoint and has been requiring more and more chronic oxygen support at home.  He and his wife had COVID in February 2024 and since that time he has needed chronic 2 L nasal cannula 24 hours a day.  Over the past month he has noticed progressive dyspnea even with his oxygen support. He also describes fatigue, poor p.o. appetite with some nausea, and mild congestion.  He also reports a 13 pound weight gain this past week even though he has been taking his Lasix as prescribed twice a day and the last 4 days added metolazone as instructed by his cardiologist. He denies fever/chills, chest pain, abdominal pain/vomiting/diarrhea, urinary symptoms or productive cough.  ED course: Upon arrival patient alert and oriented but hypotensive and mildly hypoxic requiring 2 L chronic O2.  Labs significant for mild hyponatremia, hyperglycemia, elevated BNP & Transaminitis.  Creatinine elevated but does appear to be close to baseline from most recent labs in care everywhere.  Imaging revealed vascular congestion, interstitial edema and pleural effusions.  Blood pressure improved initially and patient was treated with oxygen and Lasix, however he became hypotensive again requiring low-dose Levophed drip. Medications given: 40 mg Lasix, Levophed drip started Initial Vitals: 97.8, 16, 86, 76/30 & 89% on 2 L [place] Significant labs: (Labs/ Imaging personally reviewed) I, Cheryll Cockayne Rust-Chester, AGACNP-BC,  personally viewed and interpreted this ECG. EKG Interpretation: Date: 11/28/22, EKG Time: 15:55, Rate: 85, Rhythm: NSR, QRS Axis: RAD Intervals: RBBB, ST/T Wave abnormalities: non specific T wave abnormalities,  Narrative Interpretation: NSR with RBBB Chemistry: Na+:130, K+: 4.5, Cl: 93, glucose: 256, BUN/Cr.: 53/1.53, Serum CO2/ AG: 23/14, AST/ALT: 42/177, T.Bili:1.5 Hematology: WBC: 9.3, Hgb: 12.2,  Troponin: 19 > 17, BNP: 1280.6  CXR 11/28/22: Enlarged heart with vascular congestion and interstitial edema.  Tiny pleural effusions.  Status post median sternotomy.  PCCM consulted for admission due to acute on chronic HFrEF exacerbation with suspected cardiogenic shock requiring peripheral vasopressor support.  Pertinent  Medical History  Type 2 diabetes mellitus HFrEF (echo 05/13/2022: LVEF 20%, mild MR/TR) s/p AICD placement CAD s/p CABG Former smoker COPD (chronic 2 L nasal cannula) PAD s/p stents in LE arteries (2014) NSTEMI Obesity Anemia Chronic pain of both shoulders Skin cancer, SCC of the face Hyperlipidemia GERD  Significant Hospital Events: Including procedures, antibiotic start and stop dates in addition to other pertinent events   11/28/2022: Admit to ICU acute on chronic HFrEF exacerbation with suspected cardiogenic shock requiring peripheral vasopressor support.  Interim History / Subjective:  Patient alert and oriented sitting on edge of the bed on chronic O2 with spouse bedside.  Vital signs stable on 3 mcg Levophed drip. Plan of care discussed, all questions and concerns answered at this time.  Objective   Blood pressure 94/76, pulse (!) 108, temperature 97.8 F (36.6 C), temperature source Oral, resp. rate 18, weight 89.8 kg, SpO2 97 %.        Intake/Output Summary (Last 24 hours) at 11/28/2022 2300 Last data filed  at 11/28/2022 2239 Gross per 24 hour  Intake 700 ml  Output 650 ml  Net 50 ml   Filed Weights   11/28/22 1552  Weight: 89.8 kg     Examination: General: Adult male, acutely ill,sitting in bed, NAD HEENT: MM pink/moist, anicteric, atraumatic, neck supple Neuro: A&O x 4, able to follow commands, PERRL +3, MAE CV: s1s2 RRR, NSR with RBBB on monitor, no r/m/g Pulm: Regular, non labored on RA , breath sounds clear-BUL & diminished with fine crackles-BLL GI: soft, rounded, non tender, bs x 4 Skin: excoriated areas scattered over body Extremities: warm/dry, pulses + 2 R/P, +2 edema noted BLE  Resolved Hospital Problem list     Assessment & Plan:  Acute on Chronic HFrEF exacerbation Cardiogenic Shock PMHx: HFrEF s/p AICD, CAD s/p CABG, PAD - Continuous cardiac monitoring  - continue outpatient ASA -Continue peripheral Levophed drip, wean as tolerated to maintain MAP > 65 - Strict I/O's: alert provider if UOP < 0.5 mL/kg/hr - Daily BMP, replace electrolytes PRN - Daily weights to assess volume status - Diurese with the use of IV lasix as renal function and hemodynamics allow - Cardiology consulted, appreciate input - Cardiac diet with sodium restriction - outpatient regimen on hold due to hypotension: Carvedilol, Lasix, metolazone, spironolactone  Chronic hypoxic Respiratory Failure  Chronic COPD without exacerbation Idiopathic Pulmonary fibrosis - Utilize BIPAP/HHFNC as needed - Supplemental O2 to maintain SpO2 > 90% - Intermittent chest x-ray & ABG PRN - Ensure adequate pulmonary hygiene  - bronchodilators PRN, hold outpatient Esbriet due to Transaminitis  CKD Stage 3a - Strict I/O's: alert provider if UOP < 0.5 mL/kg/hr - Daily BMP, replace electrolytes PRN - Avoid nephrotoxic agents as able, ensure adequate renal perfusion  Transaminitis  - Trend hepatic function - Consider RUQ [place] - avoid hepatotoxic agents - hold outpatient pravastatin until LFT's normalize  Type 2 Diabetes Mellitus Hemoglobin A1C: pending - Monitor CBG Q 4 hours - SSI moderate dosing - target range while in ICU: 140-180 -  follow ICU hyper/hypo-glycemia protocol - metformin on hold  Chronic Pain -Continue outpatient Norco -Hold high-dose long-acting buprenorphine, substitute with oxycodone IR 10 mg every 6, consider titration if hemodynamics stabilize  Best Practice (right click and "Reselect all SmartList Selections" daily)  Diet/type: Regular consistency (see orders) DVT prophylaxis: LMWH GI prophylaxis: N/A Lines: N/A Foley:  N/A Code Status:  full code Last date of multidisciplinary goals of care discussion [11/28/22]  Labs   CBC: Recent Labs  Lab 11/28/22 1606  WBC 9.3  HGB 12.2*  HCT 39.6  MCV 98.0  PLT 185    Basic Metabolic Panel: Recent Labs  Lab 11/28/22 1606 11/28/22 1855  NA  --  130*  K  --  4.5  CL  --  93*  CO2  --  23  GLUCOSE  --  256*  BUN 53* QUANTITY NOT SUFFICIENT, UNABLE TO PERFORM TEST  CREATININE 1.53* QUANTITY NOT SUFFICIENT, UNABLE TO PERFORM TEST  CALCIUM  --  8.3*   GFR: CrCl cannot be calculated (This lab value cannot be used to calculate CrCl because it is not a number: QUANTITY NOT SUFFICIENT, UNABLE TO PERFORM TEST). Recent Labs  Lab 11/28/22 1606  WBC 9.3    Liver Function Tests: Recent Labs  Lab 11/28/22 1855  AST 42*  ALT 177*  ALKPHOS 67  BILITOT 1.5*  PROT 7.5  ALBUMIN 3.3*   No results for input(s): "LIPASE", "AMYLASE" in the last 168 hours. No results for  input(s): "AMMONIA" in the last 168 hours.  ABG    Component Value Date/Time   PHART 7.25 (L) 09/08/2017 1325   PCO2ART 48 09/08/2017 1325   PO2ART 54 (L) 09/08/2017 1325   HCO3 21.0 09/08/2017 1325   ACIDBASEDEF 6.3 (H) 09/08/2017 1325   O2SAT 81.6 09/08/2017 1325     Coagulation Profile: No results for input(s): "INR", "PROTIME" in the last 168 hours.  Cardiac Enzymes: No results for input(s): "CKTOTAL", "CKMB", "CKMBINDEX", "TROPONINI" in the last 168 hours.  HbA1C: Hgb A1c MFr Bld  Date/Time Value Ref Range Status  07/21/2018 03:31 AM 7.8 (H) 4.8 - 5.6 % Final     Comment:    (NOTE) Pre diabetes:          5.7%-6.4% Diabetes:              >6.4% Glycemic control for   <7.0% adults with diabetes     CBG: No results for input(s): "GLUCAP" in the last 168 hours.  Review of Systems: Positives in BOLD  Gen: Denies fever, chills, weight change, fatigue, night sweats HEENT: Denies blurred vision, double vision, hearing loss, tinnitus, sinus congestion, rhinorrhea, sore throat, neck stiffness, dysphagia PULM: Denies shortness of breath, cough, sputum production, hemoptysis, wheezing CV: Denies chest pain, edema, orthopnea, paroxysmal nocturnal dyspnea, palpitations GI: Denies abdominal pain, nausea, vomiting, diarrhea, hematochezia, melena, constipation, change in bowel habits GU: Denies dysuria, hematuria, polyuria, oliguria, urethral discharge Endocrine: Denies hot or cold intolerance, polyuria, polyphagia or appetite change Derm: Denies rash, dry skin, scaling or peeling skin change Heme: Denies easy bruising, bleeding, bleeding gums Neuro: Denies headache, numbness, weakness, slurred speech, loss of memory or consciousness  Past Medical History:  He,  has a past medical history of Cancer (HCC), CHF (congestive heart failure) (HCC), COPD (chronic obstructive pulmonary disease) (HCC), Coronary artery disease, Coronary artery disease, Diabetes mellitus without complication (HCC), Dyspnea, Fibromyalgia, History of kidney stones, IPF (idiopathic pulmonary fibrosis) (HCC), MI (myocardial infarction) (HCC), Peripheral vascular disease (HCC), and Pneumonia.   Surgical History:   Past Surgical History:  Procedure Laterality Date   APPLICATION OF WOUND VAC     chest wound s/p CABG   CARDIAC CATHETERIZATION     CARDIAC DEFIBRILLATOR PLACEMENT  08/15/2022   CORONARY ARTERY BYPASS GRAFT  2015   3 vessels   ENDARTERECTOMY Left 09/08/2017   Procedure: ENDARTERECTOMY CAROTID;  Surgeon: Renford Dills, MD;  Location: [facility] ORS;  Service: Vascular;   Laterality: Left;   LOWER EXTREMITY ANGIOGRAPHY Right 01/29/2019   Procedure: LOWER EXTREMITY ANGIOGRAPHY;  Surgeon: Renford Dills, MD;  Location: [facility] INVASIVE CV LAB;  Service: Cardiovascular;  Laterality: Right;   PERIPHERAL VASCULAR CATHETERIZATION N/A 06/09/2015   Procedure: Abdominal Aortogram w/Lower Extremity;  Surgeon: Renford Dills, MD;  Location: [facility] INVASIVE CV LAB;  Service: Cardiovascular;  Laterality: N/A;   PERIPHERAL VASCULAR CATHETERIZATION  06/09/2015   Procedure: Lower Extremity Intervention;  Surgeon: Renford Dills, MD;  Location: [facility] INVASIVE CV LAB;  Service: Cardiovascular;;   stent in leg Left    WOUND DEBRIDEMENT     debridement of chest wound x 2 s/p CABG      Social History:   reports that he quit smoking about 9 years ago. His smoking use included cigarettes. He has quit using smokeless tobacco. He reports current alcohol use. He reports that he does not use drugs.   Family History:  His family history includes Cancer in his father; Heart attack in his mother. There is no  history of Mental illness.   Allergies No Known Allergies   Home Medications  Prior to Admission medications   Medication Sig Start Date End Date Taking? Authorizing Provider  acetaminophen (TYLENOL) 500 MG tablet Take 1 tablet (500 mg total) by mouth every 8 (eight) hours as needed for moderate pain or headache. 07/14/20   Edward Jolly, MD  albuterol (PROVENTIL HFA;VENTOLIN HFA) 108 (90 Base) MCG/ACT inhaler Inhale 2 puffs into the lungs every 6 (six) hours as needed for wheezing or shortness of breath.    [provider]  aspirin EC 81 MG tablet Take 81 mg by mouth daily.    [provider]  Buprenorphine HCl (BELBUCA) 450 MCG FILM Place 1 Film (450 mcg total) inside cheek every 12 (twelve) hours. 09/27/22 12/26/22  Edward Jolly, MD  buPROPion (WELLBUTRIN XL) 150 MG 24 hr tablet Take by mouth. Patient not taking: Reported on 10/14/2021 11/23/20 11/23/21  [provider]  carvedilol (COREG) 3.125 MG tablet Take by mouth. 01/21/20 09/22/22  [provider]  furosemide (LASIX) 40 MG tablet Take 40-80 mg by mouth See admin instructions. Take 80 mg in the morning and 40 mg in the evening    [provider]  HYDROcodone-acetaminophen (NORCO) 10-325 MG tablet Take 1 tablet by mouth every 8 (eight) hours as needed for severe pain. Must last 30 days. 10/27/22 11/26/22  Edward Jolly, MD  HYDROcodone-acetaminophen (NORCO) 10-325 MG tablet Take 1 tablet by mouth every 8 (eight) hours as needed for severe pain. Must last 30 days. 11/26/22 12/26/22  Edward Jolly, MD  HYDROcodone-acetaminophen (NORCO) 10-325 MG tablet Take 1 tablet by mouth every 8 (eight) hours as needed for severe pain. Must last 30 days. 09/27/22 10/27/22  Edward Jolly, MD  metFORMIN (GLUCOPHAGE) 500 MG tablet Take 500 mg by mouth 2 (two) times daily with a meal. 06/19/20   [provider]  Pirfenidone (ESBRIET) 267 MG CAPS Take 801 mg by mouth 2 (two) times daily.  03/28/18   [provider]  pravastatin (PRAVACHOL) 40 MG tablet Take 40 mg by mouth every other day.     [provider]  sodium chloride (OCEAN) 0.65 % nasal spray Place 1 spray into the nose as needed (dryness).    [provider]  spironolactone (ALDACTONE) 25 MG tablet Take 1 tablet by mouth as needed. 08/31/20   [provider]  tamsulosin (FLOMAX) 0.4 MG CAPS capsule Take 0.4 mg by mouth every evening.    [provider]     Critical care time: 64 minutes       Betsey Holiday, AGACNP-BC Acute Care Nurse Practitioner [facility]   [phone] / [phone] Please see Amion for pager details.

## 2022-11-28 NOTE — ED Triage Notes (Signed)
C/O chest tightness, SOB, decrease appetite for "a while".  Wears 2l/ [place] [facility] home oxygen.

## 2022-11-29 ENCOUNTER — Inpatient Hospital Stay: Payer: Medicare HMO | Admitting: Anesthesiology

## 2022-11-29 ENCOUNTER — Inpatient Hospital Stay
Admit: 2022-11-29 | Discharge: 2022-11-29 | Disposition: A | Discharge status: Home / Self Care | Payer: Medicare HMO | Attending: Student in an Organized Health Care Education/Training Program | Admitting: Student in an Organized Health Care Education/Training Program

## 2022-11-29 ENCOUNTER — Inpatient Hospital Stay: Payer: Medicare HMO

## 2022-11-29 ENCOUNTER — Encounter
Admission: EM | Disposition: A | Discharge status: Home / Self Care | Payer: Self-pay | Source: Home / Self Care | Attending: Internal Medicine

## 2022-11-29 DIAGNOSIS — J9601 Acute respiratory failure with hypoxia: Secondary | ICD-10-CM

## 2022-11-29 DIAGNOSIS — I509 Heart failure, unspecified: Secondary | ICD-10-CM

## 2022-11-29 DIAGNOSIS — J810 Acute pulmonary edema: Secondary | ICD-10-CM

## 2022-11-29 HISTORY — PX: RIGHT HEART CATH: CATH118263

## 2022-11-29 LAB — CBC
HCT: 33.1 % — ABNORMAL LOW (ref 39.0–52.0)
HCT: 37.5 % — ABNORMAL LOW (ref 39.0–52.0)
Hemoglobin: 10.7 g/dL — ABNORMAL LOW (ref 13.0–17.0)
Hemoglobin: 11.6 g/dL — ABNORMAL LOW (ref 13.0–17.0)
MCH: 30.7 pg (ref 26.0–34.0)
MCH: 30.1 pg (ref 26.0–34.0)
MCHC: 32.3 g/dL (ref 30.0–36.0)
MCHC: 30.9 g/dL (ref 30.0–36.0)
MCV: 95.1 fL (ref 80.0–100.0)
MCV: 97.2 fL (ref 80.0–100.0)
Platelets: 206 10*3/uL (ref 150–400)
Platelets: 189 10*3/uL (ref 150–400)
RBC: 3.48 MIL/uL — ABNORMAL LOW (ref 4.22–5.81)
RBC: 3.86 MIL/uL — ABNORMAL LOW (ref 4.22–5.81)
RDW: 15.1 % (ref 11.5–15.5)
RDW: 15.2 % (ref 11.5–15.5)
WBC: 8.9 10*3/uL (ref 4.0–10.5)
WBC: 6 10*3/uL (ref 4.0–10.5)
nRBC: 0 % (ref 0.0–0.2)
nRBC: 0 % (ref 0.0–0.2)

## 2022-11-29 LAB — ECHOCARDIOGRAM COMPLETE
AR max vel: 2.53 cm2
AV Area VTI: 2.04 cm2
AV Area mean vel: 2.31 cm2
AV Mean grad: 2 mmHg
AV Peak grad: 3.3 mmHg
Ao pk vel: 0.9 m/s
Area-P 1/2: 5.58 cm2
Est EF: <20
MV VTI: 1.75 cm2
S' Lateral: 5.9 cm
Weight: 3121.71 oz

## 2022-11-29 LAB — POCT I-STAT EG7
Acid-Base Excess: 4 mmol/L — ABNORMAL HIGH (ref 0.0–2.0)
Bicarbonate: 30.2 mmol/L — ABNORMAL HIGH (ref 20.0–28.0)
Calcium, Ion: 1.19 mmol/L (ref 1.15–1.40)
HCT: 38 % — ABNORMAL LOW (ref 39.0–52.0)
Hemoglobin: 12.9 g/dL — ABNORMAL LOW (ref 13.0–17.0)
O2 Saturation: 54 %
Potassium: 3.9 mmol/L (ref 3.5–5.1)
Sodium: 134 mmol/L — ABNORMAL LOW (ref 135–145)
TCO2: 32 mmol/L (ref 22–32)
pCO2, Ven: 48.8 mmHg (ref 44–60)
pH, Ven: 7.4 (ref 7.25–7.43)
pO2, Ven: 29 mmHg — CL (ref 32–45)

## 2022-11-29 LAB — HEMOGLOBIN A1C
Hgb A1c MFr Bld: 9 % — ABNORMAL HIGH (ref 4.8–5.6)
Mean Plasma Glucose: 211.6 mg/dL

## 2022-11-29 LAB — BASIC METABOLIC PANEL
Anion gap: 13 (ref 5–15)
Anion gap: 14 (ref 5–15)
BUN: 56 mg/dL — ABNORMAL HIGH (ref 8–23)
BUN: 56 mg/dL — ABNORMAL HIGH (ref 8–23)
CO2: 24 mmol/L (ref 22–32)
CO2: 26 mmol/L (ref 22–32)
Calcium: 8.4 mg/dL — ABNORMAL LOW (ref 8.9–10.3)
Calcium: 8.6 mg/dL — ABNORMAL LOW (ref 8.9–10.3)
Chloride: 95 mmol/L — ABNORMAL LOW (ref 98–111)
Chloride: 92 mmol/L — ABNORMAL LOW (ref 98–111)
Creatinine, Ser: 1.44 mg/dL — ABNORMAL HIGH (ref 0.61–1.24)
Creatinine, Ser: 1.51 mg/dL — ABNORMAL HIGH (ref 0.61–1.24)
GFR, Estimated: 55 mL/min — ABNORMAL LOW (ref >60)
GFR, Estimated: 52 mL/min — ABNORMAL LOW (ref >60)
Glucose, Bld: 170 mg/dL — ABNORMAL HIGH (ref 70–99)
Glucose, Bld: 186 mg/dL — ABNORMAL HIGH (ref 70–99)
Potassium: 3.8 mmol/L (ref 3.5–5.1)
Potassium: 4 mmol/L (ref 3.5–5.1)
Sodium: 132 mmol/L — ABNORMAL LOW (ref 135–145)
Sodium: 132 mmol/L — ABNORMAL LOW (ref 135–145)

## 2022-11-29 LAB — GLUCOSE, CAPILLARY
Glucose-Capillary: 218 mg/dL — ABNORMAL HIGH (ref 70–99)
Glucose-Capillary: 216 mg/dL — ABNORMAL HIGH (ref 70–99)
Glucose-Capillary: 130 mg/dL — ABNORMAL HIGH (ref 70–99)
Glucose-Capillary: 211 mg/dL — ABNORMAL HIGH (ref 70–99)
Glucose-Capillary: 74 mg/dL (ref 70–99)
Glucose-Capillary: 185 mg/dL — ABNORMAL HIGH (ref 70–99)
Glucose-Capillary: 125 mg/dL — ABNORMAL HIGH (ref 70–99)

## 2022-11-29 LAB — POCT I-STAT 7, (LYTES, BLD GAS, ICA,H+H)
Acid-Base Excess: 3 mmol/L — ABNORMAL HIGH (ref 0.0–2.0)
Bicarbonate: 27.1 mmol/L (ref 20.0–28.0)
Calcium, Ion: 1.19 mmol/L (ref 1.15–1.40)
HCT: 37 % — ABNORMAL LOW (ref 39.0–52.0)
Hemoglobin: 12.6 g/dL — ABNORMAL LOW (ref 13.0–17.0)
O2 Saturation: 96 %
Potassium: 3.8 mmol/L (ref 3.5–5.1)
Sodium: 133 mmol/L — ABNORMAL LOW (ref 135–145)
TCO2: 28 mmol/L (ref 22–32)
pCO2 arterial: 40.3 mmHg (ref 32–48)
pH, Arterial: 7.436 (ref 7.35–7.45)
pO2, Arterial: 80 mmHg — ABNORMAL LOW (ref 83–108)

## 2022-11-29 LAB — HIV ANTIBODY (ROUTINE TESTING W REFLEX): HIV Screen 4th Generation wRfx: NONREACTIVE

## 2022-11-29 LAB — PHOSPHORUS: Phosphorus: 2.8 mg/dL (ref 2.5–4.6)

## 2022-11-29 LAB — MAGNESIUM
Magnesium: 2.3 mg/dL (ref 1.7–2.4)
Magnesium: 2.3 mg/dL (ref 1.7–2.4)

## 2022-11-29 LAB — MRSA NEXT GEN BY PCR, NASAL: MRSA by PCR Next Gen: NOT DETECTED

## 2022-11-29 SURGERY — RIGHT HEART CATH
Anesthesia: Moderate Sedation

## 2022-11-29 MED ORDER — SODIUM CHLORIDE 0.9 % IV SOLN: 250.0000 mL | INTRAVENOUS | Status: DC | PRN

## 2022-11-29 MED ORDER — SODIUM CHLORIDE 0.9% FLUSH: 3.0000 mL | INTRAVENOUS | Status: DC | PRN

## 2022-11-29 MED ORDER — SODIUM CHLORIDE 0.9% FLUSH
3.0000 mL | Freq: Two times a day (BID) | INTRAVENOUS | Status: DC
  Administered 2022-11-29 – 2022-12-07 (×14): 3 mL via INTRAVENOUS

## 2022-11-29 MED ORDER — PERFLUTREN LIPID MICROSPHERE
1.0000 mL | INTRAVENOUS | Status: AC | PRN
  Administered 2022-11-29: 5 mL via INTRAVENOUS

## 2022-11-29 MED ORDER — ASPIRIN 81 MG PO TBEC
81.0000 mg | DELAYED_RELEASE_TABLET | Freq: Every day | ORAL | Status: DC
  Administered 2022-11-30 – 2022-12-07 (×8): 81 mg via ORAL
  Filled 2022-11-29 (×8): qty 1

## 2022-11-29 MED ORDER — HYDRALAZINE HCL 20 MG/ML IJ SOLN: 10.0000 mg | INTRAMUSCULAR | Status: AC | PRN

## 2022-11-29 MED ORDER — ASPIRIN 81 MG PO CHEW: 81.0000 mg | CHEWABLE_TABLET | ORAL | Status: DC

## 2022-11-29 MED ORDER — SODIUM CHLORIDE 0.9% FLUSH
3.0000 mL | Freq: Two times a day (BID) | INTRAVENOUS | Status: DC
  Administered 2022-11-29 – 2022-12-07 (×10): 3 mL via INTRAVENOUS

## 2022-11-29 MED ORDER — HYDROCODONE-ACETAMINOPHEN 10-325 MG PO TABS
1.0000 | ORAL_TABLET | Freq: Three times a day (TID) | ORAL | Status: DC | PRN
  Administered 2022-11-29 – 2022-12-07 (×11): 1 via ORAL
  Filled 2022-11-29 (×11): qty 1

## 2022-11-29 MED ORDER — CHLORHEXIDINE GLUCONATE CLOTH 2 % EX PADS
6.0000 | MEDICATED_PAD | Freq: Every day | CUTANEOUS | Status: DC
  Administered 2022-11-30 – 2022-12-06 (×7): 6 via TOPICAL

## 2022-11-29 MED ORDER — ACETAMINOPHEN 325 MG PO TABS: 650.0000 mg | ORAL_TABLET | ORAL | Status: DC | PRN

## 2022-11-29 MED ORDER — ASPIRIN 81 MG PO CHEW
81.0000 mg | CHEWABLE_TABLET | Freq: Once | ORAL | Status: AC
  Administered 2022-11-29: 81 mg via ORAL
  Filled 2022-11-29: qty 1

## 2022-11-29 MED ORDER — INSULIN ASPART 100 UNIT/ML IJ SOLN
0.0000 [IU] | INTRAMUSCULAR | Status: DC
  Administered 2022-11-29: 3 [IU] via SUBCUTANEOUS
  Administered 2022-11-29: 5 [IU] via SUBCUTANEOUS
  Administered 2022-11-29 (×2): 2 [IU] via SUBCUTANEOUS
  Administered 2022-11-29: 5 [IU] via SUBCUTANEOUS
  Administered 2022-11-30: 3 [IU] via SUBCUTANEOUS
  Administered 2022-11-30: 8 [IU] via SUBCUTANEOUS
  Administered 2022-11-30: 3 [IU] via SUBCUTANEOUS
  Administered 2022-11-30: 5 [IU] via SUBCUTANEOUS
  Administered 2022-12-01 (×3): 3 [IU] via SUBCUTANEOUS
  Administered 2022-12-01: 5 [IU] via SUBCUTANEOUS
  Administered 2022-12-01: 8 [IU] via SUBCUTANEOUS
  Administered 2022-12-02: 3 [IU] via SUBCUTANEOUS
  Administered 2022-12-02: 5 [IU] via SUBCUTANEOUS
  Administered 2022-12-03: 3 [IU] via SUBCUTANEOUS
  Administered 2022-12-03: 2 [IU] via SUBCUTANEOUS
  Administered 2022-12-03: 3 [IU] via SUBCUTANEOUS
  Filled 2022-11-29 (×20): qty 1

## 2022-11-29 MED ORDER — LIDOCAINE HCL (PF) 1 % IJ SOLN
INTRAMUSCULAR | Status: DC | PRN
  Administered 2022-11-29: 2 mL

## 2022-11-29 MED ORDER — ENSURE MAX PROTEIN PO LIQD
11.0000 [oz_av] | Freq: Every day | ORAL | Status: DC
  Administered 2022-11-30 – 2022-12-07 (×6): 11 [oz_av] via ORAL
  Filled 2022-11-29: qty 330

## 2022-11-29 MED ORDER — ALBUTEROL SULFATE (2.5 MG/3ML) 0.083% IN NEBU: 2.5000 mg | INHALATION_SOLUTION | Freq: Four times a day (QID) | RESPIRATORY_TRACT | Status: DC | PRN

## 2022-11-29 MED ORDER — MAGNESIUM SULFATE 2 GM/50ML IV SOLN
2.0000 g | Freq: Once | INTRAVENOUS | Status: AC
  Administered 2022-11-29: 2 g via INTRAVENOUS
  Filled 2022-11-29: qty 50

## 2022-11-29 MED ORDER — ONDANSETRON HCL 4 MG/2ML IJ SOLN: 4.0000 mg | Freq: Four times a day (QID) | INTRAMUSCULAR | Status: DC | PRN

## 2022-11-29 MED ORDER — LABETALOL HCL 5 MG/ML IV SOLN: 10.0000 mg | INTRAVENOUS | Status: AC | PRN

## 2022-11-29 MED ORDER — SODIUM CHLORIDE 0.9 % IV SOLN
250.0000 mL | INTRAVENOUS | Status: DC | PRN
  Administered 2022-11-29: 250 mL via INTRAVENOUS

## 2022-11-29 MED ORDER — ASPIRIN 81 MG PO TBEC: 81.0000 mg | DELAYED_RELEASE_TABLET | Freq: Every day | ORAL | Status: DC

## 2022-11-29 MED ORDER — ADULT MULTIVITAMIN W/MINERALS CH
1.0000 | ORAL_TABLET | Freq: Every day | ORAL | Status: DC
  Administered 2022-11-30 – 2022-12-07 (×8): 1 via ORAL
  Filled 2022-11-29 (×8): qty 1

## 2022-11-29 MED ORDER — FUROSEMIDE 10 MG/ML IJ SOLN
10.0000 mg/h | INTRAVENOUS | Status: DC
  Administered 2022-11-29 – 2022-11-30 (×2): 8 mg/h via INTRAVENOUS
  Administered 2022-12-01 – 2022-12-02 (×2): 10 mg/h via INTRAVENOUS
  Filled 2022-11-29 (×5): qty 20

## 2022-11-29 MED ORDER — FUROSEMIDE 10 MG/ML IJ SOLN
60.0000 mg | Freq: Once | INTRAMUSCULAR | Status: AC
  Administered 2022-11-29: 60 mg via INTRAVENOUS
  Filled 2022-11-29: qty 6

## 2022-11-29 MED ORDER — OXYCODONE HCL 5 MG PO TABS
10.0000 mg | ORAL_TABLET | Freq: Four times a day (QID) | ORAL | Status: DC
  Administered 2022-11-29 – 2022-12-07 (×33): 10 mg via ORAL
  Filled 2022-11-29 (×34): qty 2

## 2022-11-29 MED ORDER — HEPARIN (PORCINE) IN NACL 1000-0.9 UT/500ML-% IV SOLN
INTRAVENOUS | Status: DC | PRN
  Administered 2022-11-29: 1000 mL

## 2022-11-29 SURGICAL SUPPLY — 9 items
CATH BALLN WEDGE 5F 110CM (CATHETERS) IMPLANT
DRAPE BRACHIAL (DRAPES) IMPLANT
DRAPE FEMORAL ANGIO W/ POUCH (DRAPES) IMPLANT
GUIDEWIRE EMER 3M J .025X150CM (WIRE) IMPLANT
PACK CARDIAC CATH (CUSTOM PROCEDURE TRAY) ×1 IMPLANT
PROTECTION STATION PRESSURIZED (MISCELLANEOUS) ×1
SET ATX SIMPLICITY (MISCELLANEOUS) IMPLANT
SHEATH GLIDE SLENDER 4/5FR (SHEATH) IMPLANT
STATION PROTECTION PRESSURIZED (MISCELLANEOUS) IMPLANT

## 2022-11-29 NOTE — Progress Notes (Signed)
eLink Physician-Brief Progress Note Patient Name: [name] DOB: 1960/08/15 MRN: [id number]   Date of Service  11/29/2022  HPI/Events of Note  62 year old with a history of heart failure with reduced ejection fraction, COPD, and coronary artery disease status post CABG that came and went to acute respiratory failure with hypoxemia requiring 2 L of oxygen Fluid overload in CHF exacerbation.  He was tachycardic and tachypneic.  He was placed on norepinephrine and requiring up to 4 mcg.  Complete metabolic panel consistent with mild hyponatremia, elevated creatinine, and elevated BNP.  Chest radiograph with interstitial edema.  eICU Interventions  Patient received 1 dose of Lasix, urinating well unresponsiveness.  Feels like he has a hard time getting it all out but urinating independently.  Not requiring BiPAP for respiratory support, but could utilize it if his respiratory status declines further  Congestive hepatopathy should be trended with a.m. labs tomorrow.  Home med rec pending, will reevaluate after ground team evaluation.  No immediate needs.    GI prophylaxis not indicated.  Enoxaparin for DVT prophylaxis in place.   Intervention Category Evaluation Type: New Patient Evaluation  Jamika Sadek 11/29/2022, 2:16 AM

## 2022-11-29 NOTE — Progress Notes (Signed)
*  PRELIMINARY RESULTS* Echocardiogram 2D Echocardiogram has been performed.  Carolyne Fiscal 11/29/2022, 11:36 AM

## 2022-11-29 NOTE — Progress Notes (Signed)
  [facility] CLINIC CARDIOLOGY CONSULT NOTE       Patient ID: [name] MRN: [id number] DOB/AGE: October 05, 1960 62 y.o.  Admit date: 11/28/2022 Referring Physician Eual Fines, NP  Primary Physician Dr. Marcello Fennel Primary Cardiologist Dr. Darrold Junker Reason for Consultation AoCHF  Interval History:  -making good urine while on lasix gtt at 52mcg/hr, net negative just over 1L  -BP still ok without vasopressor support  -s/p RHC this afternoon with Dr. Darrold Junker with results below.     Hemodynamic findings consistent with severe pulmonary hypertension.   1.  Pulmonary hypertension with PA pressure 77/33 with pulmonary arterial mean 50 mmHg 2.  Elevated pulmonary capillary wedge 31 mmHg consistent with systolic congestive heart failure 3.  Cardiac output 4.02, cardiac index 2.01   Recommendations   1.  Continue diuresis 2.  Blood pressure support as needed  Plan:  - continue lasix gtt for today with likely up titration tomorrow - Consider dopamine for BP support overnight if needed rather than Levophed - Continue ICU level of care for today, possible transition to stepdown if he can remain off of vasopressors  This patient's plan of care was discussed and created with Dr. Darrold Junker and he is in agreement.  Signed: Rebeca Allegra , PA-C 11/29/2022, 4:59 PM [facility]

## 2022-11-29 NOTE — Plan of Care (Signed)

## 2022-11-29 NOTE — ED Notes (Signed)
I transported the pt. To ICU 17 from 0050 to 0105 on CCM and 2L's O2.

## 2022-11-29 NOTE — Consult Note (Signed)
[facility] CLINIC CARDIOLOGY CONSULT NOTE       Patient ID: KADARIUS VILLANEDA MRN: [id number] DOB/AGE: 1961/02/06 62 y.o.  Admit date: 11/28/2022 Referring Physician Eual Fines, NP  Primary Physician Dr. Marcello Fennel Primary Cardiologist Dr. Darrold Junker Reason for Consultation AoCHF  HPI: [name] ia a [phone] with a PMH of HFrEF (20%), s/p primary prevention ICD 07/2022, CAD s/p CABG x 3 (LIMA-LAD, SVG-OM2, SVG-PDA 12/09/2013), ILD/chronic respiratory failure (2L), DM2 who presented initially to [facility] Cardiology clinic with 2 months of worsening dyspnea on exertion, ~13 pound weight gain in the past week and abdominal distention despite taking metolazone. He was referred directly to [facility] ED, where he was hypotensive to 70/30 and hypoxic, requiring levophed & 2L by [place]. Cardiology consulted for assistance with his CHF exacerbation.   The patient presents 2 months weight gain, shortness of breath, and abdominal distention despite taking metolazone 5 mg once a day for 3 days prescribed by his PCP.  He initially presented to his cardiologist office for an acute visit yesterday morning, where he was hypotensive and tachypneic and referred directly to [facility] ED for further evaluation and management. He weighs himself daily and notes his baseline weights was 175-180lbs. Notes weight fluctuations when he takes metolazone, but this time his weight has remained up and increasing to ~198lbs on admission. His lasix 40mg  BID was discontinued for a period of time recently by his PCP due to low BP per the patients report. The patient's appetite has been overall poor and admits to some use of salt with food. Admits to 1-2 pillow orthopnea, but denies chest pain, lightheadedness or dizziness, or presyncope.  At my time of evaluation this morning patient is sitting upright with his legs off hospital bed, eating breakfast with his wife, son, daughter present at the bedside.  He feels a little bit better first presented to not  quite at his baseline. He was given 40 mg of IV Lasix x 1 yesterday and started on a Levophed infusion which was turned off this morning.    Blood pressure low 100s systolic.  Comfortable on 2 L of oxygen by nasal cannula.  Labs notable for BUN/creatinine 56/1.51 and GFR 52.  Slight elevation in AST ALT at 42/177 respectively.  Alk phos within normal limits at 67.  BNP elevated at 1283, mag 2.3, potassium 4.0.  High-sensitivity troponin initially borderline elevated at 19, and on repeat WNL at 17, H&H 11.6/37.5. plts 189.    Review of systems complete and found to be negative unless listed above     Past Medical History:  Diagnosis Date   Cancer (HCC)    skin (scalp)   CHF (congestive heart failure) (HCC)    COPD (chronic obstructive pulmonary disease) (HCC)    Coronary artery disease    Coronary artery disease    Diabetes mellitus without complication (HCC)    Dyspnea    Fibromyalgia    History of kidney stones    asymptomatic   IPF (idiopathic pulmonary fibrosis) (HCC)    MI (myocardial infarction) (HCC)    Peripheral vascular disease (HCC)    Pneumonia     Past Surgical History:  Procedure Laterality Date   APPLICATION OF WOUND VAC     chest wound s/p CABG   CARDIAC CATHETERIZATION     CARDIAC DEFIBRILLATOR PLACEMENT  08/15/2022   CORONARY ARTERY BYPASS GRAFT  2015   3 vessels   ENDARTERECTOMY Left 09/08/2017   Procedure: ENDARTERECTOMY CAROTID;  Surgeon: Renford Dills, MD;  Location: [facility] ORS;  Service: Vascular;  Laterality: Left;   LOWER EXTREMITY ANGIOGRAPHY Right 01/29/2019   Procedure: LOWER EXTREMITY ANGIOGRAPHY;  Surgeon: Renford Dills, MD;  Location: [facility] INVASIVE CV LAB;  Service: Cardiovascular;  Laterality: Right;   PERIPHERAL VASCULAR CATHETERIZATION N/A 06/09/2015   Procedure: Abdominal Aortogram w/Lower Extremity;  Surgeon: Renford Dills, MD;  Location: [facility] INVASIVE CV LAB;  Service: Cardiovascular;  Laterality: N/A;   PERIPHERAL VASCULAR  CATHETERIZATION  06/09/2015   Procedure: Lower Extremity Intervention;  Surgeon: Renford Dills, MD;  Location: [facility] INVASIVE CV LAB;  Service: Cardiovascular;;   stent in leg Left    WOUND DEBRIDEMENT     debridement of chest wound x 2 s/p CABG     Medications Prior to Admission  Medication Sig Dispense Refill Last Dose   albuterol (PROVENTIL HFA;VENTOLIN HFA) 108 (90 Base) MCG/ACT inhaler Inhale 2 puffs into the lungs every 6 (six) hours as needed for wheezing or shortness of breath.   unk at unk   aspirin EC 81 MG tablet Take 81 mg by mouth daily.   Past Week   Buprenorphine HCl (BELBUCA) 450 MCG FILM Place 1 Film (450 mcg total) inside cheek every 12 (twelve) hours. 60 each 2 11/27/2022   carvedilol (COREG) 3.125 MG tablet Take by mouth.   11/28/2022   furosemide (LASIX) 40 MG tablet Take 40 mg by mouth 2 (two) times daily.   11/28/2022   HYDROcodone-acetaminophen (NORCO) 10-325 MG tablet Take 1 tablet by mouth every 8 (eight) hours as needed for severe pain. Must last 30 days. 90 tablet 0 11/27/2022   metFORMIN (GLUCOPHAGE) 500 MG tablet Take 500 mg by mouth 2 (two) times daily with a meal.   Past Month   metolazone (ZAROXOLYN) 5 MG tablet Take 5 mg by mouth daily as needed.   11/28/2022   Pirfenidone (ESBRIET) 267 MG CAPS Take 801 mg by mouth 2 (two) times daily.    11/28/2022   pravastatin (PRAVACHOL) 40 MG tablet Take 40 mg by mouth every other day.    11/27/2022   sodium chloride (OCEAN) 0.65 % nasal spray Place 1 spray into the nose as needed (dryness).   unk at unk   tamsulosin (FLOMAX) 0.4 MG CAPS capsule Take 0.4 mg by mouth every evening.  3 Past Month   acetaminophen (TYLENOL) 500 MG tablet Take 1 tablet (500 mg total) by mouth every 8 (eight) hours as needed for moderate pain or headache. (Patient not taking: Reported on 11/28/2022) 90 tablet 2 Not Taking   HYDROcodone-acetaminophen (NORCO) 10-325 MG tablet Take 1 tablet by mouth every 8 (eight) hours as needed for severe pain. Must last 30  days. 90 tablet 0    HYDROcodone-acetaminophen (NORCO) 10-325 MG tablet Take 1 tablet by mouth every 8 (eight) hours as needed for severe pain. Must last 30 days. 90 tablet 0    spironolactone (ALDACTONE) 25 MG tablet Take 1 tablet by mouth as needed. (Patient not taking: Reported on 11/28/2022)   Not Taking   Social History   Socioeconomic History   Marital status: Married    Spouse name: jerri   Number of children: 2   Years of education: Not on file   Highest education level: Some college, no degree  Occupational History   Not on file  Tobacco Use   Smoking status: Former    Types: Cigarettes    Quit date: 10/20/2013    Years since quitting: 9.1   Smokeless tobacco: Former  Psychologist, educational  Use   Vaping Use: Never used  Substance and Sexual Activity   Alcohol use: Yes    Comment: seldom   Drug use: No   Sexual activity: Not on file  Other Topics Concern   Not on file  Social History Narrative   Not on file   Social Determinants of Health   Financial Resource Strain: Low Risk  (01/28/2019)   Overall Financial Resource Strain (CARDIA)    Difficulty of Paying Living Expenses: Not hard at all  Food Insecurity: No Food Insecurity (01/28/2019)   Hunger Vital Sign    Worried About Running Out of Food in the Last Year: Never true    Ran Out of Food in the Last Year: Never true  Transportation Needs: No Transportation Needs (01/28/2019)   PRAPARE - Administrator, Civil Service (Medical): No    Lack of Transportation (Non-Medical): No  Physical Activity: Sufficiently Active (01/28/2019)   Exercise Vital Sign    Days of Exercise per Week: 5 days    Minutes of Exercise per Session: 30 min  Stress: Stress Concern Present (01/28/2019)   [employer] of Occupational Health - Occupational Stress Questionnaire    Feeling of Stress : Very much  Social Connections: Unknown (01/28/2019)   Social Connection and Isolation Panel [NHANES]    Frequency of Communication with Friends and  Family: Not on file    Frequency of Social Gatherings with Friends and Family: Not on file    Attends Religious Services: Never    Active Member of Clubs or Organizations: No    Attends Banker Meetings: Never    Marital Status: Married  Catering manager Violence: Not At Risk (01/28/2019)   Humiliation, Afraid, Rape, and Kick questionnaire    Fear of Current or Ex-Partner: No    Emotionally Abused: No    Physically Abused: No    Sexually Abused: No    Family History  Problem Relation Age of Onset   Heart attack Mother    Cancer Father    Mental illness Neg Hx       Intake/Output Summary (Last 24 hours) at 11/29/2022 0804 Last data filed at 11/29/2022 0600 Gross per 24 hour  Intake 789.45 ml  Output 1050 ml  Net -260.55 ml    Vitals:   11/29/22 0600 11/29/22 0630 11/29/22 0645 11/29/22 0700  BP: 94/64 107/81 109/75 110/79  Pulse: 79 81 81 82  Resp: 17 (!) 27 16 17   Temp:      TempSrc:      SpO2: 96% 97% 95% 95%  Weight:        PHYSICAL EXAM General: ill appearing caucasian male, appears older than chronological age in no acute distress. Sitting up in ICU bed with son, daughter, and wife present HEENT:  Normocephalic and atraumatic. Neck:  No JVD.  Lungs: Normal respiratory effort on 2L [place]. Rhonchi in bases with decreased breath sounds in upper lung fields and trace wheezing.  Heart: HRRR . Normal S1 and S2 without gallops or murmurs.  Abdomen: Non-distended appearing.  Msk: Normal strength and tone for age. Extremities: chronic brawny hyperpigmentation with 2+ bilateral LE edema, SCDs in place.  Neuro: Alert and oriented X 3. Psych:  Answers questions appropriately.   Labs: Basic Metabolic Panel: Recent Labs    11/28/22 0536 11/28/22 1606 11/28/22 1855  NA 132*  --  130*  K 3.8  --  4.5  CL 95*  --  93*  CO2 24  --  23  GLUCOSE 170*  --  256*  BUN 56* 53* QUANTITY NOT SUFFICIENT, UNABLE TO PERFORM TEST  CREATININE 1.44* 1.53* QUANTITY NOT  SUFFICIENT, UNABLE TO PERFORM TEST  CALCIUM 8.4*  --  8.3*  MG 2.3  --   --   PHOS 2.8  --   --    Liver Function Tests: Recent Labs    11/28/22 1855  AST 42*  ALT 177*  ALKPHOS 67  BILITOT 1.5*  PROT 7.5  ALBUMIN 3.3*   No results for input(s): "LIPASE", "AMYLASE" in the last 72 hours. CBC: Recent Labs    11/28/22 0536 11/28/22 1606  WBC 8.9 9.3  HGB 10.7* 12.2*  HCT 33.1* 39.6  MCV 95.1 98.0  PLT 206 185   Cardiac Enzymes: Recent Labs    11/28/22 1606 11/28/22 1855  TROPONINIHS 19* 17   BNP: Recent Labs    11/28/22 1613  BNP 1,280.6*   D-Dimer: No results for input(s): "DDIMER" in the last 72 hours. Hemoglobin A1C: No results for input(s): "HGBA1C" in the last 72 hours. Fasting Lipid Panel: No results for input(s): "CHOL", "HDL", "LDLCALC", "TRIG", "CHOLHDL", "LDLDIRECT" in the last 72 hours. Thyroid Function Tests: No results for input(s): "TSH", "T4TOTAL", "T3FREE", "THYROIDAB" in the last 72 hours.  Invalid input(s): "FREET3" Anemia Panel: No results for input(s): "VITAMINB12", "FOLATE", "FERRITIN", "TIBC", "IRON", "RETICCTPCT" in the last 72 hours.   Radiology: DG Chest 2 View  Result Date: 11/28/2022 CLINICAL DATA:  Chest pain EXAM: CHEST - 2 VIEW COMPARISON:  X-ray 12/26/2020.  Report of a x-ray 11/08/2022 FINDINGS: Status post median sternotomy. Left upper chest defibrillator with leads along the right side of the enlarged heart. Increasing interstitial changes, possibly edema. Small pleural effusions. No pneumothorax. Overlapping cardiac leads. Osteopenia. IMPRESSION: Status post median sternotomy. Enlarged heart with vascular congestion and interstitial edema. Tiny pleural effusions. Defibrillator.  Recommend follow-up Electronically Signed   By: Karen Kays M.D.   On: 11/28/2022 16:46    ECHO 05/20/2022 INTERPRETATION  SEVERE LV SYSTOLIC DYSFUNCTION (See above)  MILD RV SYSTOLIC DYSFUNCTION (See above)  MILD VALVULAR REGURGITATION (See above)   NO VALVULAR STENOSIS  Contraction: SEVERE GLOBAL DECREASE  Closest EF: 20% (Estimated)  Aortic: TRIVIAL AR  Mitral: MILD MR  Tricuspid: MILD TR  _________________________________________________________________________________________  Electronically signed by            MD Mariel Kansky on 05/20/2022 09: 54 AM           Performed By: Luretha Murphy, RDCS, RVT     Ordering Physician: Lady Gary, MD [place] reviewed by me (LT) 11/29/2022 : NSR rate 80s-90s, NSVT 20 bests   EKG reviewed by me: NSR rate 84 incomplete RBBB with chronic TW flattening laterally   Data reviewed by me (LT) 11/29/2022: PCP note, ED note, cardiology clinic note, admission H&P last 24h vitals tele labs imaging I/O    Principal Problem:   Cardiogenic shock (HCC)    ASSESSMENT AND PLAN:  [name] ia a [phone] with a PMH of HFrEF (20%), s/p primary prevention ICD 07/2022, CAD s/p CABG x 3 (LIMA-LAD, SVG-OM2, SVG-PDA 12/09/2013), ILD/chronic respiratory failure (2L), DM2 who presented initially to [facility] Cardiology clinic with 2 months of worsening dyspnea on exertion, ~13 pound weight gain in the past week and abdominal distention despite taking metolazone. He was referred directly to [facility] ED, where he was hypotensive to 70/30 and hypoxic, requiring levophed & 2L by [place]. Cardiology consulted for assistance with his CHF  exacerbation.   # cardiogenic shock  # acute on chronic HFrEF  Echo this admission shows EF < 20% with severely dilated LV, moderately elevated PASP, moderate MR, mi-mod TR. BNP elevated at 1200, CXR with pulmonary edema and pleural effusions. Clinically hypervolemic with 2+ pitting edema in both lower legs and abdominal distention on exam today. ~ 20-25lbs above dry weight. Discussed case with Dr. Cheral [facility]) who recommended RHC to assess hemodynamics to help guide management, levophed for BP support if needed.  - levophed off since this AM, BP low normal  - give lasix 60mg  IV x 1, then start lasix  gtt at 8mg /hr  - hold coreg with borderline low BP. ARB/ARNI, SGLT2i, MRA limited by BP historically. Will aim to add as BP and renal function allow.  - salt and fluid restriction, strict I/O  - plan for RHC this afternoon to assess hemodynamics. Risks and benefits discussed with patient and family and patient is agreeable to proceed. NPO now except for sips with meds. On cath lab schedule for ~3:30 pm with Dr. Darrold Junker.    # acute on chronic respiratory failure  # hx ILD  Intermittent 2L at home, using more frequently as he's been more short of breath from the above.   # CAD s/p CABG  Chest pain free, EKG non ischemic. Troponin 19, 17. Continue aspirin 81mg  daily + home pravastatin 40mg  every other day.   # poorly controlled DM2 A1C 9%. Could benefit for SGLT2i, GLP-1 as above.   This patient's plan of care was discussed and created with Dr. Darrold Junker and he is in agreement.  Signed: Rebeca Allegra , PA-C 11/29/2022, 8:04 AM [facility] Cardiology

## 2022-11-29 NOTE — Consult Note (Signed)
PHARMACY CONSULT NOTE - FOLLOW UP  Pharmacy Consult for Electrolyte Monitoring and Replacement   Recent Labs: Potassium (mmol/L)  Date Value  11/29/2022 4.0  10/22/2013 4.5   Magnesium (mg/dL)  Date Value  41/28/7867 2.3   Calcium (mg/dL)  Date Value  67/20/9470 8.6 (L)   Calcium, Total (mg/dL)  Date Value  96/28/3662 8.0 (L)   Albumin (g/dL)  Date Value  94/76/5465 3.3 (L)  10/20/2013 3.5   Phosphorus (mg/dL)  Date Value  03/54/6568 2.8   Sodium (mmol/L)  Date Value  11/29/2022 132 (L)  10/22/2013 134 (L)     Assessment: 62 yo M presenting to [facility] ED from home for evaluation of progressive shortness of breath, weight gain and bilateral lower extremity edema. Patient was intubated and transferred to ICU. Pharmacy has been consulted to monitor and replace electrolytes while under PCCM care.  Goal of Therapy:  Electrolytes WNL  Plan:  No replacement currently indicated Will recheck electrolytes with AM labs  Bettey Costa ,PharmD Clinical Pharmacist 11/29/2022 1:36 PM

## 2022-11-29 NOTE — Progress Notes (Signed)
NAME:  [name], MRN:  [id number], DOB:  24-Aug-1960, LOS: 1 ADMISSION DATE:  11/28/2022, CHIEF COMPLAINT:  Shortness of breath   History of Present Illness:   62 yo M presenting to [facility] ED from home for evaluation of progressive shortness of breath, weight gain and bilateral lower extremity edema.   History provided by patient and spouse bedside. They describe that since January 2024 when his defibrillator was placed he has been declining from a respiratory standpoint and has been requiring more and more chronic oxygen support at home.  He and his wife had COVID in February 2024 and since that time he has needed chronic 2 L nasal cannula 24 hours a day.  Over the past month he has noticed progressive dyspnea even with his oxygen support. He also describes fatigue, poor p.o. appetite with some nausea, and mild congestion.  He also reports a 13 pound weight gain this past week even though he has been taking his Lasix as prescribed twice a day and the last 4 days added metolazone as instructed by his cardiologist. He denies fever/chills, chest pain, abdominal pain/vomiting/diarrhea, urinary symptoms or productive cough.   ED course: Upon arrival patient alert and oriented but hypotensive and mildly hypoxic requiring 2 L chronic O2.  Labs significant for mild hyponatremia, hyperglycemia, elevated BNP & Transaminitis.  Creatinine elevated but does appear to be close to baseline from most recent labs in care everywhere.  Imaging revealed vascular congestion, interstitial edema and pleural effusions.  Blood pressure improved initially and patient was treated with oxygen and Lasix, however he became hypotensive again requiring low-dose Levophed drip.  CXR 11/28/22: Enlarged heart with vascular congestion and interstitial edema.  Tiny pleural effusions.  Status post median sternotomy.   PCCM consulted for admission due to acute on chronic HFrEF exacerbation with suspected cardiogenic shock requiring  peripheral vasopressor support.  Pertinent  Medical History  Type 2 diabetes mellitus HFrEF (echo 05/13/2022: LVEF 20%, mild MR/TR) s/p AICD placement CAD s/p CABG Former smoker COPD (chronic 2 L nasal cannula) PAD s/p stents in LE arteries (2014) NSTEMI Obesity Anemia Chronic pain of both shoulders Skin cancer, SCC of the face Hyperlipidemia GERD  Significant Hospital Events: Including procedures, antibiotic start and stop dates in addition to other pertinent events   11/28/2022: Admit to ICU acute on chronic HFrEF exacerbation with suspected cardiogenic shock requiring peripheral vasopressor support.  Interim History / Subjective:  Patient reports feeling weak and short of breath  Objective   Blood pressure 103/82, pulse 84, temperature 98.1 F (36.7 C), temperature source Oral, resp. rate 15, weight 88.5 kg, SpO2 95 %.        Intake/Output Summary (Last 24 hours) at 11/29/2022 0952 Last data filed at 11/29/2022 0800 Gross per 24 hour  Intake 814.46 ml  Output 1050 ml  Net -235.54 ml   Filed Weights   11/28/22 1552 11/29/22 0115  Weight: 89.8 kg 88.5 kg    Examination: Physical Exam Constitutional:      General: He is not in acute distress.    Appearance: Normal appearance. He is not ill-appearing.  HENT:     Head: Normocephalic.     Mouth/Throat:     Mouth: Mucous membranes are moist.  Cardiovascular:     Rate and Rhythm: Normal rate and regular rhythm.     Pulses: Normal pulses.     Heart sounds: Normal heart sounds.  Pulmonary:     Effort: Pulmonary effort is normal.  Breath sounds: Rales present. No wheezing.  Abdominal:     Palpations: Abdomen is soft.  Musculoskeletal:     Right lower leg: Edema present.     Left lower leg: Edema present.  Neurological:     General: No focal deficit present.     Mental Status: He is alert and oriented to person, place, and time. Mental status is at baseline.      Assessment & Plan:   Neurology  Patient is  at baseline, denies pain. Home meds continued  Cardiovascular #Acute Decompensated HFrEF #CAD s/p CABG #HFrEF s/p AICD  History of CAD and HFrEF, last EF was 20%, s/p ICD. He is maintained on furosemide, metolazone, and carvedilol at home. Presets with shortness of breath, with imaging and blood work (elevated BNP, normal troponin) consistent with pulmonary edema as well as his known underlying interstitial lung disease. Started on lasix, but held secondary to hypotension. He was briefly on norepinephrine that we have since discontinued. Cardiology consulted and recs appreciated. They will consider him for RHC today.  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 Patient also with a history of smoking. Maintained on oxygen via nasal cannula, also on Pirfenidone, followed by Dr. Meredeth Ide. On oxygen via nasal cannula at home. Crackles on exam, likely secondary to a combination of pulmonary edema and ILD.  -hold perfinidone given elevated LFT's -oxygen via nasal cannula  Gastrointestinal #Cardiac Cirrhosis  LFT's are elevated in the setting of Perfinidone use as well as decompensated heart failure. Abdominal US ordered.  -hold perfinidone -US abdomen  Renal #AKI on CKD  AKI on presentation in the setting of furosemide and metolazone use at home, as well as hypotension. Will attempt to minimize exposure to nephrotoxic medications. He is volume overloaded and will require IV diuresis in coordination with cardiology. Started on furosemide gtt per cards recommendations.  Endocrine #T2DM  ICU glycemic protocol  Hem/Onc Lovenox for DVT prophylaxis  ID No sign of infection, monitor   Best Practice (right click and "Reselect all SmartList Selections" daily)   Diet/type: Regular consistency (see orders) DVT prophylaxis: LMWH GI  prophylaxis: N/A Lines: N/A Foley:  N/A Code Status:  full code Last date of multidisciplinary goals of care discussion [11/29/2022]  Labs   CBC: Recent Labs  Lab 11/28/22 0536 11/28/22 1606  WBC 8.9 9.3  HGB 10.7* 12.2*  HCT 33.1* 39.6  MCV 95.1 98.0  PLT 206 185    Basic Metabolic Panel: Recent Labs  Lab 11/28/22 0536 11/28/22 1606 11/28/22 1855  NA 132*  --  130*  K 3.8  --  4.5  CL 95*  --  93*  CO2 24  --  23  GLUCOSE 170*  --  256*  BUN 56* 53* QUANTITY NOT SUFFICIENT, UNABLE TO PERFORM TEST  CREATININE 1.44* 1.53* QUANTITY NOT SUFFICIENT, UNABLE TO PERFORM TEST  CALCIUM 8.4*  --  8.3*  MG 2.3  --   --   PHOS 2.8  --   --    GFR: CrCl cannot be calculated (This lab value cannot be used to calculate CrCl because it is not a number: QUANTITY NOT SUFFICIENT, UNABLE TO PERFORM TEST). Recent Labs  Lab 11/28/22 0536 11/28/22 1606  WBC 8.9 9.3    Liver Function Tests: Recent Labs  Lab 11/28/22 1855  AST 42*  ALT 177*  ALKPHOS 67  BILITOT 1.5*  PROT 7.5  ALBUMIN  3.3*   No results for input(s): "LIPASE", "AMYLASE" in the last 168 hours. No results for input(s): "AMMONIA" in the last 168 hours.  ABG    Component Value Date/Time   PHART 7.25 (L) 09/08/2017 1325   PCO2ART 48 09/08/2017 1325   PO2ART 54 (L) 09/08/2017 1325   HCO3 21.0 09/08/2017 1325   ACIDBASEDEF 6.3 (H) 09/08/2017 1325   O2SAT 81.6 09/08/2017 1325     Coagulation Profile: No results for input(s): "INR", "PROTIME" in the last 168 hours.  Cardiac Enzymes: No results for input(s): "CKTOTAL", "CKMB", "CKMBINDEX", "TROPONINI" in the last 168 hours.  HbA1C: Hgb A1c MFr Bld  Date/Time Value Ref Range Status  07/21/2018 03:31 AM 7.8 (H) 4.8 - 5.6 % Final    Comment:    (NOTE) Pre diabetes:          5.7%-6.4% Diabetes:              >6.4% Glycemic control for   <7.0% adults with diabetes     CBG: Recent Labs  Lab 11/29/22 0105 11/29/22 0308 11/29/22 0747  GLUCAP 218* 216*  130*   Past Medical History:  He,  has a past medical history of Cancer (HCC), CHF (congestive heart failure) (HCC), COPD (chronic obstructive pulmonary disease) (HCC), Coronary artery disease, Coronary artery disease, Diabetes mellitus without complication (HCC), Dyspnea, Fibromyalgia, History of kidney stones, IPF (idiopathic pulmonary fibrosis) (HCC), MI (myocardial infarction) (HCC), Peripheral vascular disease (HCC), and Pneumonia.   Surgical History:   Past Surgical History:  Procedure Laterality Date   APPLICATION OF WOUND VAC     chest wound s/p CABG   CARDIAC CATHETERIZATION     CARDIAC DEFIBRILLATOR PLACEMENT  08/15/2022   CORONARY ARTERY BYPASS GRAFT  2015   3 vessels   ENDARTERECTOMY Left 09/08/2017   Procedure: ENDARTERECTOMY CAROTID;  Surgeon: Renford Dills, MD;  Location: [facility] ORS;  Service: Vascular;  Laterality: Left;   LOWER EXTREMITY ANGIOGRAPHY Right 01/29/2019   Procedure: LOWER EXTREMITY ANGIOGRAPHY;  Surgeon: Renford Dills, MD;  Location: [facility] INVASIVE CV LAB;  Service: Cardiovascular;  Laterality: Right;   PERIPHERAL VASCULAR CATHETERIZATION N/A 06/09/2015   Procedure: Abdominal Aortogram w/Lower Extremity;  Surgeon: Renford Dills, MD;  Location: [facility] INVASIVE CV LAB;  Service: Cardiovascular;  Laterality: N/A;   PERIPHERAL VASCULAR CATHETERIZATION  06/09/2015   Procedure: Lower Extremity Intervention;  Surgeon: Renford Dills, MD;  Location: [facility] INVASIVE CV LAB;  Service: Cardiovascular;;   stent in leg Left    WOUND DEBRIDEMENT     debridement of chest wound x 2 s/p CABG      Social History:   reports that he quit smoking about 9 years ago. His smoking use included cigarettes. He has quit using smokeless tobacco. He reports current alcohol use. He reports that he does not use drugs.   Family History:  His family history includes Cancer in his father; Heart attack in his mother. There is no history of Mental illness.   Allergies No Known  Allergies   Home Medications  Prior to Admission medications   Medication Sig Start Date End Date Taking? Authorizing Provider  albuterol (PROVENTIL HFA;VENTOLIN HFA) 108 (90 Base) MCG/ACT inhaler Inhale 2 puffs into the lungs every 6 (six) hours as needed for wheezing or shortness of breath.   Yes [provider]  aspirin EC 81 MG tablet Take 81 mg by mouth daily.   Yes [provider]  Buprenorphine HCl (BELBUCA) 450 MCG FILM Place 1 Film (450  mcg total) inside cheek every 12 (twelve) hours. 09/27/22 12/26/22 Yes Edward Jolly, MD  carvedilol (COREG) 3.125 MG tablet Take by mouth. 01/21/20 11/28/22 Yes [provider]  furosemide (LASIX) 40 MG tablet Take 40 mg by mouth 2 (two) times daily.   Yes [provider]  HYDROcodone-acetaminophen (NORCO) 10-325 MG tablet Take 1 tablet by mouth every 8 (eight) hours as needed for severe pain. Must last 30 days. 11/26/22 12/26/22 Yes Edward Jolly, MD  metFORMIN (GLUCOPHAGE) 500 MG tablet Take 500 mg by mouth 2 (two) times daily with a meal. 06/19/20  Yes [provider]  metolazone (ZAROXOLYN) 5 MG tablet Take 5 mg by mouth daily as needed. 11/15/22 11/15/23 Yes [provider]  Pirfenidone (ESBRIET) 267 MG CAPS Take 801 mg by mouth 2 (two) times daily.  03/28/18  Yes [provider]  pravastatin (PRAVACHOL) 40 MG tablet Take 40 mg by mouth every other day.    Yes [provider]  sodium chloride (OCEAN) 0.65 % nasal spray Place 1 spray into the nose as needed (dryness).   Yes [provider]  tamsulosin (FLOMAX) 0.4 MG CAPS capsule Take 0.4 mg by mouth every evening.   Yes [provider]  acetaminophen (TYLENOL) 500 MG tablet Take 1 tablet (500 mg total) by mouth every 8 (eight) hours as needed for moderate pain or headache. Patient not taking: Reported on 11/28/2022 07/14/20   Edward Jolly, MD  HYDROcodone-acetaminophen (NORCO) 10-325 MG tablet Take 1 tablet by mouth every 8  (eight) hours as needed for severe pain. Must last 30 days. 10/27/22 11/26/22  Edward Jolly, MD  HYDROcodone-acetaminophen (NORCO) 10-325 MG tablet Take 1 tablet by mouth every 8 (eight) hours as needed for severe pain. Must last 30 days. 09/27/22 10/27/22  Edward Jolly, MD  spironolactone (ALDACTONE) 25 MG tablet Take 1 tablet by mouth as needed. Patient not taking: Reported on 11/28/2022 08/31/20   [provider]     Critical care time: 40 minutes    Raechel Chute, MD [facility] 11/29/2022 12:53 PM

## 2022-11-30 ENCOUNTER — Encounter: Payer: Self-pay | Admitting: Cardiology

## 2022-11-30 DIAGNOSIS — R570 Cardiogenic shock: Secondary | ICD-10-CM

## 2022-11-30 LAB — CBC
HCT: 37.9 % — ABNORMAL LOW (ref 39.0–52.0)
Hemoglobin: 12 g/dL — ABNORMAL LOW (ref 13.0–17.0)
MCH: 30 pg (ref 26.0–34.0)
MCHC: 31.7 g/dL (ref 30.0–36.0)
MCV: 94.8 fL (ref 80.0–100.0)
Platelets: 216 10*3/uL (ref 150–400)
RBC: 4 MIL/uL — ABNORMAL LOW (ref 4.22–5.81)
RDW: 14.9 % (ref 11.5–15.5)
WBC: 5.8 10*3/uL (ref 4.0–10.5)
nRBC: 0 % (ref 0.0–0.2)

## 2022-11-30 LAB — GLUCOSE, CAPILLARY
Glucose-Capillary: 110 mg/dL — ABNORMAL HIGH (ref 70–99)
Glucose-Capillary: 146 mg/dL — ABNORMAL HIGH (ref 70–99)
Glucose-Capillary: 157 mg/dL — ABNORMAL HIGH (ref 70–99)
Glucose-Capillary: 167 mg/dL — ABNORMAL HIGH (ref 70–99)
Glucose-Capillary: 234 mg/dL — ABNORMAL HIGH (ref 70–99)
Glucose-Capillary: 261 mg/dL — ABNORMAL HIGH (ref 70–99)

## 2022-11-30 LAB — PHOSPHORUS: Phosphorus: 3.6 mg/dL (ref 2.5–4.6)

## 2022-11-30 LAB — HEPATIC FUNCTION PANEL
ALT: 125 U/L — ABNORMAL HIGH (ref 0–44)
AST: 33 U/L (ref 15–41)
Albumin: 3.5 g/dL (ref 3.5–5.0)
Alkaline Phosphatase: 69 U/L (ref 38–126)
Bilirubin, Direct: 0.5 mg/dL — ABNORMAL HIGH (ref 0.0–0.2)
Indirect Bilirubin: 1 mg/dL — ABNORMAL HIGH (ref 0.3–0.9)
Total Bilirubin: 1.5 mg/dL — ABNORMAL HIGH (ref 0.3–1.2)
Total Protein: 7.6 g/dL (ref 6.5–8.1)

## 2022-11-30 LAB — BASIC METABOLIC PANEL
Anion gap: 12 (ref 5–15)
BUN: 54 mg/dL — ABNORMAL HIGH (ref 8–23)
CO2: 30 mmol/L (ref 22–32)
Calcium: 8.9 mg/dL (ref 8.9–10.3)
Chloride: 92 mmol/L — ABNORMAL LOW (ref 98–111)
Creatinine, Ser: 1.3 mg/dL — ABNORMAL HIGH (ref 0.61–1.24)
GFR, Estimated: >60 mL/min (ref >60)
Glucose, Bld: 131 mg/dL — ABNORMAL HIGH (ref 70–99)
Potassium: 3.9 mmol/L (ref 3.5–5.1)
Sodium: 134 mmol/L — ABNORMAL LOW (ref 135–145)

## 2022-11-30 LAB — MAGNESIUM: Magnesium: 2.7 mg/dL — ABNORMAL HIGH (ref 1.7–2.4)

## 2022-11-30 MED ORDER — DOPAMINE-DEXTROSE 3.2-5 MG/ML-% IV SOLN
0.0000 ug/kg/min | INTRAVENOUS | Status: DC
  Administered 2022-11-30: 3.5 ug/kg/min via INTRAVENOUS
  Administered 2022-12-01 (×2): 10 ug/kg/min via INTRAVENOUS
  Administered 2022-12-02: 3.5 ug/kg/min via INTRAVENOUS
  Filled 2022-11-30 (×4): qty 250

## 2022-11-30 MED ORDER — AMIODARONE LOAD VIA INFUSION
150.0000 mg | Freq: Once | INTRAVENOUS | Status: AC
  Administered 2022-11-30: 150 mg via INTRAVENOUS
  Filled 2022-11-30: qty 83.34

## 2022-11-30 MED ORDER — AMIODARONE HCL IN DEXTROSE 360-4.14 MG/200ML-% IV SOLN
60.0000 mg/h | INTRAVENOUS | Status: DC
  Administered 2022-11-30 (×2): 60 mg/h via INTRAVENOUS
  Filled 2022-11-30 (×3): qty 200

## 2022-11-30 MED ORDER — POTASSIUM CHLORIDE CRYS ER 20 MEQ PO TBCR
40.0000 meq | EXTENDED_RELEASE_TABLET | Freq: Once | ORAL | Status: AC
  Administered 2022-11-30: 40 meq via ORAL
  Filled 2022-11-30: qty 2

## 2022-11-30 MED ORDER — AMIODARONE HCL IN DEXTROSE 360-4.14 MG/200ML-% IV SOLN
30.0000 mg/h | INTRAVENOUS | Status: DC
  Administered 2022-11-30 – 2022-12-01 (×2): 30 mg/h via INTRAVENOUS
  Filled 2022-11-30: qty 200

## 2022-11-30 NOTE — Progress Notes (Signed)
1910 patient alert x4 able to make all needs known at side of bed at this time  12/01/2022 0400 patient have Vtach run 34 beats while sitting at side of bed talking non-symptomatic BP 120/84

## 2022-11-30 NOTE — Plan of Care (Signed)
  Problem: Education: Goal: Knowledge of General Education information will improve Description: Including pain rating scale, medication(s)/side effects and non-pharmacologic comfort measures Outcome: Progressing   Problem: Health Behavior/Discharge Planning: Goal: Ability to manage health-related needs will improve Outcome: Progressing   Problem: Clinical Measurements: Goal: Ability to maintain clinical measurements within normal limits will improve Outcome: Progressing Goal: Will remain free from infection Outcome: Progressing Goal: Diagnostic test results will improve Outcome: Progressing Goal: Respiratory complications will improve Outcome: Progressing   Problem: Activity: Goal: Risk for activity intolerance will decrease Outcome: Progressing   Problem: Nutrition: Goal: Adequate nutrition will be maintained Outcome: Progressing   Problem: Coping: Goal: Level of anxiety will decrease Outcome: Progressing   Problem: Elimination: Goal: Will not experience complications related to bowel motility Outcome: Progressing Goal: Will not experience complications related to urinary retention Outcome: Progressing   Problem: Pain Managment: Goal: General experience of comfort will improve Outcome: Progressing   Problem: Safety: Goal: Ability to remain free from injury will improve Outcome: Progressing   Problem: Skin Integrity: Goal: Risk for impaired skin integrity will decrease Outcome: Progressing   Problem: Education: Goal: Ability to describe self-care measures that may prevent or decrease complications (Diabetes Survival Skills Education) will improve Outcome: Progressing Goal: Individualized Educational Video(s) Outcome: Progressing   Problem: Coping: Goal: Ability to adjust to condition or change in health will improve Outcome: Progressing   Problem: Fluid Volume: Goal: Ability to maintain a balanced intake and output will improve Outcome: Progressing    Problem: Health Behavior/Discharge Planning: Goal: Ability to identify and utilize available resources and services will improve Outcome: Progressing Goal: Ability to manage health-related needs will improve Outcome: Progressing   Problem: Metabolic: Goal: Ability to maintain appropriate glucose levels will improve Outcome: Progressing   Problem: Nutritional: Goal: Maintenance of adequate nutrition will improve Outcome: Progressing Goal: Progress toward achieving an optimal weight will improve Outcome: Progressing   Problem: Skin Integrity: Goal: Risk for impaired skin integrity will decrease Outcome: Progressing   Problem: Tissue Perfusion: Goal: Adequacy of tissue perfusion will improve Outcome: Progressing   Problem: Education: Goal: Understanding of CV disease, CV risk reduction, and recovery process will improve Outcome: Progressing Goal: Individualized Educational Video(s) Outcome: Progressing   Problem: Activity: Goal: Ability to return to baseline activity level will improve Outcome: Progressing   Problem: Cardiovascular: Goal: Ability to achieve and maintain adequate cardiovascular perfusion will improve Outcome: Progressing Goal: Vascular access site(s) Level 0-1 will be maintained Outcome: Progressing   Problem: Health Behavior/Discharge Planning: Goal: Ability to safely manage health-related needs after discharge will improve Outcome: Progressing

## 2022-11-30 NOTE — Progress Notes (Signed)
[facility] CLINIC CARDIOLOGY CONSULT NOTE       Patient ID: [name] MRN: [id number] DOB/AGE: [birth year] 62 y.o.  Admit date: 11/28/2022 Referring Physician Eual Fines, NP  Primary Physician Dr. Marcello Fennel Primary Cardiologist Dr. Darrold Junker Reason for Consultation AoCHF  HPI: [name] ia a [phone] with a PMH of HFrEF (20%), s/p primary prevention ICD 07/2022, CAD s/p CABG x 3 (LIMA-LAD, SVG-OM2, SVG-PDA 12/09/2013), ILD/chronic respiratory failure (2L), DM2 who presented initially to [facility] Cardiology clinic with 2 months of worsening dyspnea on exertion, ~13 pound weight gain in the past week and abdominal distention despite taking metolazone. He was referred directly to [facility] ED, where he was hypotensive to 70/30 and hypoxic, requiring levophed & 2L by [name]. Cardiology consulted for assistance with his CHF exacerbation.   Interval History:  - feels much better overall today, ate a good dinner (most he has eaten in a while)  - diuresing well on a lasix drip Net IO Since Admission: -3,233.64 mL [11/30/22 1250]  - no chest pain, shortness of breath, orthopnea. Remains on supplemental O2 - noted ~45 secs of VT this AM and BP low normal     Review of systems complete and found to be negative unless listed above     Past Medical History:  Diagnosis Date   Cancer (HCC)    skin (scalp)   CHF (congestive heart failure) (HCC)    COPD (chronic obstructive pulmonary disease) (HCC)    Coronary artery disease    Coronary artery disease    Diabetes mellitus without complication (HCC)    Dyspnea    Fibromyalgia    History of kidney stones    asymptomatic   IPF (idiopathic pulmonary fibrosis) (HCC)    MI (myocardial infarction) (HCC)    Peripheral vascular disease (HCC)    Pneumonia     Past Surgical History:  Procedure Laterality Date   APPLICATION OF WOUND VAC     chest wound s/p CABG   CARDIAC CATHETERIZATION     CARDIAC DEFIBRILLATOR PLACEMENT  08/15/2022   CORONARY  ARTERY BYPASS GRAFT  2015   3 vessels   ENDARTERECTOMY Left 09/08/2017   Procedure: ENDARTERECTOMY CAROTID;  Surgeon: Renford Dills, MD;  Location: [facility] ORS;  Service: Vascular;  Laterality: Left;   LOWER EXTREMITY ANGIOGRAPHY Right 01/29/2019   Procedure: LOWER EXTREMITY ANGIOGRAPHY;  Surgeon: Renford Dills, MD;  Location: [facility] INVASIVE CV LAB;  Service: Cardiovascular;  Laterality: Right;   PERIPHERAL VASCULAR CATHETERIZATION N/A 06/09/2015   Procedure: Abdominal Aortogram w/Lower Extremity;  Surgeon: Renford Dills, MD;  Location: [facility] INVASIVE CV LAB;  Service: Cardiovascular;  Laterality: N/A;   PERIPHERAL VASCULAR CATHETERIZATION  06/09/2015   Procedure: Lower Extremity Intervention;  Surgeon: Renford Dills, MD;  Location: [facility] INVASIVE CV LAB;  Service: Cardiovascular;;   RIGHT HEART CATH N/A 11/29/2022   Procedure: RIGHT HEART CATH;  Surgeon: Marcina Millard, MD;  Location: [facility] INVASIVE CV LAB;  Service: Cardiovascular;  Laterality: N/A;   stent in leg Left    WOUND DEBRIDEMENT     debridement of chest wound x 2 s/p CABG     Medications Prior to Admission  Medication Sig Dispense Refill Last Dose   albuterol (PROVENTIL HFA;VENTOLIN HFA) 108 (90 Base) MCG/ACT inhaler Inhale 2 puffs into the lungs every 6 (six) hours as needed for wheezing or shortness of breath.   unk at unk   aspirin EC 81 MG tablet Take 81 mg by mouth daily.   Past Week  Buprenorphine HCl (BELBUCA) 450 MCG FILM Place 1 Film (450 mcg total) inside cheek every 12 (twelve) hours. 60 each 2 11/27/2022   carvedilol (COREG) 3.125 MG tablet Take by mouth.   11/28/2022   furosemide (LASIX) 40 MG tablet Take 40 mg by mouth 2 (two) times daily.   11/28/2022   HYDROcodone-acetaminophen (NORCO) 10-325 MG tablet Take 1 tablet by mouth every 8 (eight) hours as needed for severe pain. Must last 30 days. 90 tablet 0 11/27/2022   metFORMIN (GLUCOPHAGE) 500 MG tablet Take 500 mg by mouth 2 (two) times daily with a meal.    Past Month   metolazone (ZAROXOLYN) 5 MG tablet Take 5 mg by mouth daily as needed.   11/28/2022   Pirfenidone (ESBRIET) 267 MG CAPS Take 801 mg by mouth 2 (two) times daily.    11/28/2022   pravastatin (PRAVACHOL) 40 MG tablet Take 40 mg by mouth every other day.    11/27/2022   sodium chloride (OCEAN) 0.65 % nasal spray Place 1 spray into the nose as needed (dryness).   unk at unk   tamsulosin (FLOMAX) 0.4 MG CAPS capsule Take 0.4 mg by mouth every evening.  3 Past Month   acetaminophen (TYLENOL) 500 MG tablet Take 1 tablet (500 mg total) by mouth every 8 (eight) hours as needed for moderate pain or headache. (Patient not taking: Reported on 11/28/2022) 90 tablet 2 Not Taking   HYDROcodone-acetaminophen (NORCO) 10-325 MG tablet Take 1 tablet by mouth every 8 (eight) hours as needed for severe pain. Must last 30 days. 90 tablet 0    HYDROcodone-acetaminophen (NORCO) 10-325 MG tablet Take 1 tablet by mouth every 8 (eight) hours as needed for severe pain. Must last 30 days. 90 tablet 0    spironolactone (ALDACTONE) 25 MG tablet Take 1 tablet by mouth as needed. (Patient not taking: Reported on 11/28/2022)   Not Taking   Social History   Socioeconomic History   Marital status: Married    Spouse name: jerri   Number of children: 2   Years of education: Not on file   Highest education level: Some college, no degree  Occupational History   Not on file  Tobacco Use   Smoking status: Former    Types: Cigarettes    Quit date: 10/20/2013    Years since quitting: 9.1   Smokeless tobacco: Former  Building services engineer Use: Never used  Substance and Sexual Activity   Alcohol use: Yes    Comment: seldom   Drug use: No   Sexual activity: Not on file  Other Topics Concern   Not on file  Social History Narrative   Not on file   Social Determinants of Health   Financial Resource Strain: Low Risk  (01/28/2019)   Overall Financial Resource Strain (CARDIA)    Difficulty of Paying Living Expenses: Not hard  at all  Food Insecurity: No Food Insecurity (01/28/2019)   Hunger Vital Sign    Worried About Running Out of Food in the Last Year: Never true    Ran Out of Food in the Last Year: Never true  Transportation Needs: No Transportation Needs (01/28/2019)   PRAPARE - Administrator, Civil Service (Medical): No    Lack of Transportation (Non-Medical): No  Physical Activity: Sufficiently Active (01/28/2019)   Exercise Vital Sign    Days of Exercise per Week: 5 days    Minutes of Exercise per Session: 30 min  Stress: Stress Concern Present (01/28/2019)  [employer] of Occupational Health - Occupational Stress Questionnaire    Feeling of Stress : Very much  Social Connections: Unknown (01/28/2019)   Social Connection and Isolation Panel [NHANES]    Frequency of Communication with Friends and Family: Not on file    Frequency of Social Gatherings with Friends and Family: Not on file    Attends Religious Services: Never    Active Member of Clubs or Organizations: No    Attends Banker Meetings: Never    Marital Status: Married  Catering manager Violence: Not At Risk (01/28/2019)   Humiliation, Afraid, Rape, and Kick questionnaire    Fear of Current or Ex-Partner: No    Emotionally Abused: No    Physically Abused: No    Sexually Abused: No    Family History  Problem Relation Age of Onset   Heart attack Mother    Cancer Father    Mental illness Neg Hx       Intake/Output Summary (Last 24 hours) at 11/30/2022 0755 Last data filed at 11/30/2022 0700 Gross per 24 hour  Intake 682.26 ml  Output 3950 ml  Net -3267.74 ml     Vitals:   11/30/22 0300 11/30/22 0400 11/30/22 0500 11/30/22 0600  BP: (!) 81/68 (!) 89/68 91/70 105/67  Pulse: 92 78 81 94  Resp: (!) 25 20 17 14   Temp:  97.9 F (36.6 C)    TempSrc:  Oral    SpO2: 100% 100% 100% 94%  Weight:      Height:        PHYSICAL EXAM General: ill appearing caucasian male, appears older than chronological age in  no acute distress. Laying at low incline in stepdown bed HEENT:  Normocephalic and atraumatic. Neck:  No JVD.  Lungs: Normal respiratory effort on 2L [place]. Rhonchi in bases with decreased breath sounds in upper lung fields and trace wheezing.  Heart: HRRR . Normal S1 and S2 without gallops or murmurs.  Abdomen: soft, nontender, mildly distended but improving Msk: Normal strength and tone for age. Extremities: chronic brawny hyperpigmentation with 1+ bilateral LE edema Neuro: Alert and oriented X 3. Psych:  Answers questions appropriately.   Labs: Basic Metabolic Panel: Recent Labs    11/28/22 0536 11/28/22 1606 11/29/22 1137 11/29/22 1602 11/29/22 1607 11/30/22 0512  NA 132*   < > 132*   < > 134* 134*  K 3.8   < > 4.0   < > 3.9 3.9  CL 95*   < > 92*  --   --  92*  CO2 24   < > 26  --   --  30  GLUCOSE 170*   < > 186*  --   --  131*  BUN 56*   < > 56*  --   --  54*  CREATININE 1.44*   < > 1.51*  --   --  1.30*  CALCIUM 8.4*   < > 8.6*  --   --  8.9  MG 2.3  --  2.3  --   --  2.7*  PHOS 2.8  --   --   --   --  3.6   < > = values in this interval not displayed.    Liver Function Tests: Recent Labs    11/28/22 1855  AST 42*  ALT 177*  ALKPHOS 67  BILITOT 1.5*  PROT 7.5  ALBUMIN 3.3*    No results for input(s): "LIPASE", "AMYLASE" in the last 72 hours. CBC: Recent  Labs    11/29/22 1137 11/29/22 1602 11/29/22 1607 11/30/22 0512  WBC 6.0  --   --  5.8  HGB 11.6*   < > 12.9* 12.0*  HCT 37.5*   < > 38.0* 37.9*  MCV 97.2  --   --  94.8  PLT 189  --   --  216   < > = values in this interval not displayed.    Cardiac Enzymes: Recent Labs    11/28/22 1606 11/28/22 1855  TROPONINIHS 19* 17    BNP: Recent Labs    11/28/22 1613  BNP 1,280.6*    D-Dimer: No results for input(s): "DDIMER" in the last 72 hours. Hemoglobin A1C: Recent Labs    11/29/22 0536  HGBA1C 9.0*   Fasting Lipid Panel: No results for input(s): "CHOL", "HDL", "LDLCALC", "TRIG",  "CHOLHDL", "LDLDIRECT" in the last 72 hours. Thyroid Function Tests: No results for input(s): "TSH", "T4TOTAL", "T3FREE", "THYROIDAB" in the last 72 hours.  Invalid input(s): "FREET3" Anemia Panel: No results for input(s): "VITAMINB12", "FOLATE", "FERRITIN", "TIBC", "IRON", "RETICCTPCT" in the last 72 hours.   Radiology: CARDIAC CATHETERIZATION  Result Date: 11/29/2022   Hemodynamic findings consistent with severe pulmonary hypertension. 1.  Pulmonary hypertension with PA pressure 77/33 with pulmonary arterial mean 50 mmHg 2.  Elevated pulmonary capillary wedge 31 mmHg consistent with systolic congestive heart failure 3.  Cardiac output 4.02, cardiac index 2.01 Recommendations 1.  Continue diuresis 2.  Blood pressure support as needed   ECHOCARDIOGRAM COMPLETE  Result Date: 11/29/2022    ECHOCARDIOGRAM REPORT   Patient Name:   [name] Date of Exam: 11/29/2022 Medical Rec #:  [id number]       Height:       67.0 in Accession #:    [id number]      Weight:       195.1 lb Date of Birth:  Oct 23, 1960       BSA:          2.001 m Patient Age:    61 years        BP:           96/67 mmHg Patient Gender: M               HR:           93 bpm. Exam Location:  [facility] Procedure: 2D Echo, Cardiac Doppler, Color Doppler and Intracardiac            Opacification Agent Indications:     Dyspnea  History:         Patient has prior history of Echocardiogram examinations, most                  recent 09/10/2017. CHF, CAD, Prior CABG and Defibrillator, PAD                  and COPD; Risk Factors:Diabetes and Dyslipidemia. Shock.  Sonographer:     Mikki Harbor Referring Phys:  [id number] KHABIB DGAYLI Diagnosing Phys: Marcina Millard MD  Sonographer Comments: Technically difficult study due to poor echo windows. Image acquisition challenging due to COPD. IMPRESSIONS  1. Left ventricular ejection fraction, by estimation, is <20%. The left ventricle has severely decreased function. The left ventricle has no regional wall  motion abnormalities. The left ventricular internal cavity size was severely dilated. Left ventricular diastolic parameters were normal.  2. Right ventricular systolic function is normal. The right ventricular size is normal. There is moderately elevated pulmonary artery systolic pressure.  3. The mitral valve is normal in structure. Moderate mitral valve regurgitation. No evidence of mitral stenosis.  4. Tricuspid valve regurgitation is mild to moderate.  5. The aortic valve is normal in structure. Aortic valve regurgitation is not visualized. No aortic stenosis is present.  6. The inferior vena cava is normal in size with greater than 50% respiratory variability, suggesting right atrial pressure of 3 mmHg. FINDINGS  Left Ventricle: Left ventricular ejection fraction, by estimation, is <20%. The left ventricle has severely decreased function. The left ventricle has no regional wall motion abnormalities. Definity contrast agent was given IV to delineate the left ventricular endocardial borders. The left ventricular internal cavity size was severely dilated. There is no left ventricular hypertrophy. Left ventricular diastolic parameters were normal. Right Ventricle: The right ventricular size is normal. No increase in right ventricular wall thickness. Right ventricular systolic function is normal. There is moderately elevated pulmonary artery systolic pressure. The tricuspid regurgitant velocity is 2.86 m/s, and with an assumed right atrial pressure of 15 mmHg, the estimated right ventricular systolic pressure is 47.7 mmHg. Left Atrium: Left atrial size was normal in size. Right Atrium: Right atrial size was normal in size. Pericardium: There is no evidence of pericardial effusion. Mitral Valve: The mitral valve is normal in structure. Moderate mitral valve regurgitation. No evidence of mitral valve stenosis. MV peak gradient, 4.1 mmHg. The mean mitral valve gradient is 2.0 mmHg. Tricuspid Valve: The tricuspid valve  is normal in structure. Tricuspid valve regurgitation is mild to moderate. No evidence of tricuspid stenosis. Aortic Valve: The aortic valve is normal in structure. Aortic valve regurgitation is not visualized. No aortic stenosis is present. Aortic valve mean gradient measures 2.0 mmHg. Aortic valve peak gradient measures 3.3 mmHg. Aortic valve area, by VTI measures 2.04 cm. Pulmonic Valve: The pulmonic valve was normal in structure. Pulmonic valve regurgitation is not visualized. No evidence of pulmonic stenosis. Aorta: The aortic root is normal in size and structure. Venous: The inferior vena cava is normal in size with greater than 50% respiratory variability, suggesting right atrial pressure of 3 mmHg. IAS/Shunts: No atrial level shunt detected by color flow Doppler.  LEFT VENTRICLE PLAX 2D LVIDd:         6.10 cm   Diastology LVIDs:         5.90 cm   LV e' medial:    5.26 cm/s LV PW:         1.00 cm   LV E/e' medial:  15.7 LV IVS:        1.00 cm   LV e' lateral:   8.59 cm/s LVOT diam:     2.00 cm   LV E/e' lateral: 9.6 LV SV:         34 LV SV Index:   17 LVOT Area:     3.14 cm  RIGHT VENTRICLE RV Basal diam:  3.85 cm RV Mid diam:    4.10 cm RV S prime:     4.73 cm/s TAPSE (M-mode): 1.0 cm LEFT ATRIUM           Index        RIGHT ATRIUM           Index LA diam:      4.50 cm 2.25 cm/m   RA Area:     17.30 cm LA Vol (A2C): 74.2 ml 37.08 ml/m  RA Volume:   48.50 ml  24.24 ml/m LA Vol (A4C): 76.6 ml 38.28 ml/m  AORTIC VALVE  PULMONIC VALVE AV Area (Vmax):    2.53 cm     PV Vmax:       0.80 m/s AV Area (Vmean):   2.31 cm     PV Peak grad:  2.5 mmHg AV Area (VTI):     2.04 cm AV Vmax:           90.40 cm/s AV Vmean:          62.300 cm/s AV VTI:            0.166 m AV Peak Grad:      3.3 mmHg AV Mean Grad:      2.0 mmHg LVOT Vmax:         72.70 cm/s LVOT Vmean:        45.900 cm/s LVOT VTI:          0.108 m LVOT/AV VTI ratio: 0.65  AORTA Ao Root diam: 2.70 cm MITRAL VALVE               TRICUSPID  VALVE MV Area (PHT): 5.58 cm    TR Peak grad:   32.7 mmHg MV Area VTI:   1.75 cm    TR Vmax:        286.00 cm/s MV Peak grad:  4.1 mmHg MV Mean grad:  2.0 mmHg    SHUNTS MV Vmax:       1.01 m/s    Systemic VTI:  0.11 m MV Vmean:      60.7 cm/s   Systemic Diam: 2.00 cm MV Decel Time: 136 msec MV E velocity: 82.80 cm/s MV A velocity: 43.50 cm/s MV E/A ratio:  1.90 Marcina Millard MD Electronically signed by Marcina Millard MD Signature Date/Time: 11/29/2022/1:03:23 PM    Final    US Abdomen Limited RUQ (LIVER/GB)  Result Date: 11/29/2022 CLINICAL DATA:  Transaminitis. EXAM: ULTRASOUND ABDOMEN LIMITED RIGHT UPPER QUADRANT COMPARISON:  None Available. FINDINGS: Gallbladder: Several gallstones evident measuring up to 6 mm. Gallbladder wall is mildly thickened at 3-4 mm. Sonographer reports no sonographic Murphy sign. Trace fluid is seen adjacent to the liver. Common bile duct: Diameter: 6 mm Liver: Echogenic parenchyma with nodular contour. No focal parenchymal mass lesion. No intrahepatic biliary duct dilatation. Portal vein is patent on color Doppler imaging with normal direction of blood flow towards the liver. Other: As above, there is trace perihepatic ascites. IMPRESSION: 1. Cholelithiasis with mild gallbladder wall thickening. Sonographer reports no sonographic Murphy sign. Gallbladder wall thickening can be nonspecific in the setting of liver disease. 2. Echogenic liver with nodular contour suggesting cirrhosis. 3. Trace perihepatic ascites. Electronically Signed   By: Kennith Center M.D.   On: 11/29/2022 09:41   DG Chest 2 View  Result Date: 11/28/2022 CLINICAL DATA:  Chest pain EXAM: CHEST - 2 VIEW COMPARISON:  X-ray 12/26/2020.  Report of a x-ray 11/08/2022 FINDINGS: Status post median sternotomy. Left upper chest defibrillator with leads along the right side of the enlarged heart. Increasing interstitial changes, possibly edema. Small pleural effusions. No pneumothorax. Overlapping cardiac leads.  Osteopenia. IMPRESSION: Status post median sternotomy. Enlarged heart with vascular congestion and interstitial edema. Tiny pleural effusions. Defibrillator.  Recommend follow-up Electronically Signed   By: Karen Kays M.D.   On: 11/28/2022 16:46    ECHO 05/20/2022 INTERPRETATION  SEVERE LV SYSTOLIC DYSFUNCTION (See above)  MILD RV SYSTOLIC DYSFUNCTION (See above)  MILD VALVULAR REGURGITATION (See above)  NO VALVULAR STENOSIS  Contraction: SEVERE GLOBAL DECREASE  Closest EF: 20% (Estimated)  Aortic: TRIVIAL AR  Mitral: MILD MR  Tricuspid: MILD TR  _________________________________________________________________________________________  Electronically signed by            MD Mariel Kansky on 05/20/2022 09: 54 AM           Performed By: Luretha Murphy, RDCS, RVT     Ordering Physician: Lady Gary, MD [place] reviewed by me (LT) 11/30/2022 : NSR rate 80s-90s, 45 beats of VT   EKG reviewed by me: NSR rate 84 incomplete RBBB with chronic TW flattening laterally   Data reviewed by me (LT) 11/30/2022: PCCM note, nursing notes last 24h vitals tele labs imaging I/O    Principal Problem:   Cardiogenic shock (HCC)    ASSESSMENT AND PLAN:  [name] ia a [phone] with a PMH of HFrEF (20%), s/p primary prevention ICD 07/2022, CAD s/p CABG x 3 (LIMA-LAD, SVG-OM2, SVG-PDA 12/09/2013), ILD/chronic respiratory failure (2L), DM2 who presented initially to [facility] Cardiology clinic with 2 months of worsening dyspnea on exertion, ~13 pound weight gain in the past week and abdominal distention despite taking metolazone. He was referred directly to [facility] ED, where he was hypotensive to 70/30 and hypoxic, requiring levophed & 2L by [place]. Cardiology consulted for assistance with his CHF exacerbation.   # cardiogenic shock  # acute on chronic HFrEF  # s/p primary prevention ICD  Echo this admission shows EF < 20% with severely dilated LV, moderately elevated PASP, moderate MR, mi-mod TR. BNP elevated at 1200, CXR  with pulmonary edema and pleural effusions. ~ 20-25lbs above dry weight on admission. Discussed case with Dr. Cheral [facility] HF) who recommended RHC to assess hemodynamics to help guide management. RHC with PA pressure 77/33, PA mean , PCWP c/w systolic CHF, CO 6.04, CI 2.01.  - will start dopamine for BP support to assist with diuresis this AM  - s/p lasix 60mg  IV x 1, increase lasix gtt from 8mg /hr to 10mg /hr - hold coreg with borderline low BP. ARB/ARNI, SGLT2i, MRA limited by BP historically. Will aim to add as BP and renal function allow.  - salt and fluid restriction, strict I/O  - echo resulted with EF <20%,  severe LV dilation, mod MR, mild-mod TR  # sustained VT Noted on tele for 45secs this AM, with frequent ectopy  - start amiodarone IV bolus + continuous infusion for a 1 gram load today, then start PO 400mg  BID x 10 days for a goal load ~8 grams - restart BB as BP tolerates, likely after IV diuresis    # acute on chronic respiratory failure  # hx ILD  Intermittent 2L at home, using more frequently as he's been more short of breath from the above.   # CAD s/p CABG  Chest pain free, EKG non ischemic. Troponin 19, 17. Continue aspirin 81mg  daily + home pravastatin 40mg  every other day.   # poorly controlled DM2 A1C 9%. Could benefit for SGLT2i, GLP-1 as above.   This patient's plan of care was discussed and created with Dr. Darrold Junker and he is in agreement.  Signed: Rebeca Allegra , PA-C 11/30/2022, 7:55 AM [facility] Cardiology

## 2022-11-30 NOTE — Progress Notes (Signed)
Progress Note    [name]  ZOX:[id number] DOB: 1961-06-19  DOA: 11/28/2022 PCP: Barbette Reichmann, MD      Brief Narrative:    Medical records reviewed and are as summarized below:  [name] is a 62 y.o. male PMH of HFrEF (20%), s/p primary prevention ICD 07/2022, CAD s/p CABG x 3 (LIMA-LAD, SVG-OM2, SVG-PDA 12/09/2013), ILD/chronic respiratory failure (2L), DM2, who presented to the hospital with shortness of breath, chest tightness and decreased appetite.        Assessment/Plan:   Principal Problem:   Cardiogenic shock (HCC)   Body mass index is 29.59 kg/m.   Cardiogenic shock: He is on IV dopamine infusion.  Monitor BP closely   Acute on chronic systolic CHF, s/p AICD: He is on IV Lasix drip.  Monitor BMP, daily weight and urine output.  2D echo showed EF estimated at 20%. S/p right heart cath on 12/09/2022   Ventricular tachycardia: He is on IV amiodarone drip   CAD s/p CABG: Continue aspirin   Type II DM with hyperglycemia: NovoLog as needed for hyperglycemia.   Interstitial lung disease, chronic hypoxic respiratory failure: Continue 2 L/min oxygen via [place]   CKD stage IIIa: Creatinine is stable.  Baseline creatinine is around 1.5.   Liver cirrhosis: Abdominal ultrasound showed liver cirrhosis, cholelithiasis.    Diet Order             Diet Heart Room service appropriate? Yes; Fluid consistency: Thin  Diet effective now                            Consultants: Cardiologist Intensivist  Procedures: S/p right heart cath    Medications:    aspirin EC  81 mg Oral Daily   Chlorhexidine Gluconate Cloth  6 each Topical Daily   enoxaparin (LOVENOX) injection  40 mg Subcutaneous Q24H   insulin aspart  0-15 Units Subcutaneous Q4H   multivitamin with minerals  1 tablet Oral Daily   oxyCODONE  10 mg Oral Q6H   potassium chloride  40 mEq Oral Once   Ensure Max Protein  11 oz Oral Daily   sodium chloride flush  3 mL  Intravenous Q12H   sodium chloride flush  3 mL Intravenous Q12H   Continuous Infusions:  sodium chloride     amiodarone 60 mg/hr (11/30/22 0830)   Followed by   amiodarone     DOPamine 3.5 mcg/kg/min (11/30/22 0845)   furosemide (LASIX) 200 mg in dextrose 5 % 100 mL (2 mg/mL) infusion 10 mg/hr (11/30/22 0846)   norepinephrine (LEVOPHED) Adult infusion Stopped (11/29/22 0825)     Anti-infectives (From admission, onward)    None              Family Communication/Anticipated D/C date and plan/Code Status   DVT prophylaxis: enoxaparin (LOVENOX) injection 40 mg Start: 11/29/22 0800 SCDs Start: 11/28/22 2259     Code Status: Full Code  Family Communication: None Disposition Plan: Plan to discharge home in 3 to 4 days   Status is: Inpatient Remains inpatient appropriate because: CHF exacerbation       Subjective:   Interval events noted.  No shortness of breath or chest pain.  He still has swelling in the legs.  Objective:    Vitals:   11/30/22 0400 11/30/22 0500 11/30/22 0600 11/30/22 0800  BP: (!) 89/68 91/70 105/67   Pulse: 78 81 94   Resp: 20 17  14   Temp: 97.9 F (36.6 C)     TempSrc: Oral     SpO2: 100% 100% 94%   Weight:    85.7 kg  Height:       No data found.   Intake/Output Summary (Last 24 hours) at 11/30/2022 0925 Last data filed at 11/30/2022 0700 Gross per 24 hour  Intake 657.25 ml  Output 3950 ml  Net -3292.75 ml   Filed Weights   11/29/22 0115 11/29/22 1330 11/30/22 0800  Weight: 88.5 kg 88.7 kg 85.7 kg    Exam:   GEN: NAD SKIN: Warm and dry EYES: No pallor or icterus ENT: MMM CV: RRR PULM: CTA B ABD: soft, distended, NT, +BS CNS: AAO x 3, non focal EXT: B/l lower extremity edema from thighs to the feet,  tenderness       Data Reviewed:   I have personally reviewed following labs and imaging studies:  Labs: Labs show the following:   Basic Metabolic Panel: Recent Labs  Lab 11/28/22 0536 11/28/22 1606  11/28/22 1855 11/29/22 1137 11/29/22 1602 11/29/22 1607 11/30/22 0512  NA 132*  --  130* 132* 133* 134* 134*  K 3.8  --  4.5 4.0 3.8 3.9 3.9  CL 95*  --  93* 92*  --   --  92*  CO2 24  --  23 26  --   --  30  GLUCOSE 170*  --  256* 186*  --   --  131*  BUN 56* 53* QUANTITY NOT SUFFICIENT, UNABLE TO PERFORM TEST 56*  --   --  54*  CREATININE 1.44* 1.53* QUANTITY NOT SUFFICIENT, UNABLE TO PERFORM TEST 1.51*  --   --  1.30*  CALCIUM 8.4*  --  8.3* 8.6*  --   --  8.9  MG 2.3  --   --  2.3  --   --  2.7*  PHOS 2.8  --   --   --   --   --  3.6   GFR Estimated Creatinine Clearance: 60.8 mL/min (A) (by C-G formula based on SCr of 1.3 mg/dL (H)). Liver Function Tests: Recent Labs  Lab 11/28/22 1855  AST 42*  ALT 177*  ALKPHOS 67  BILITOT 1.5*  PROT 7.5  ALBUMIN 3.3*   No results for input(s): "LIPASE", "AMYLASE" in the last 168 hours. No results for input(s): "AMMONIA" in the last 168 hours. Coagulation profile No results for input(s): "INR", "PROTIME" in the last 168 hours.  CBC: Recent Labs  Lab 11/28/22 0536 11/28/22 1606 11/29/22 1137 11/29/22 1602 11/29/22 1607 11/30/22 0512  WBC 8.9 9.3 6.0  --   --  5.8  HGB 10.7* 12.2* 11.6* 12.6* 12.9* 12.0*  HCT 33.1* 39.6 37.5* 37.0* 38.0* 37.9*  MCV 95.1 98.0 97.2  --   --  94.8  PLT 206 185 189  --   --  216   Cardiac Enzymes: No results for input(s): "CKTOTAL", "CKMB", "CKMBINDEX", "TROPONINI" in the last 168 hours. BNP (last 3 results) No results for input(s): "PROBNP" in the last 8760 hours. CBG: Recent Labs  Lab 11/29/22 1726 11/29/22 1939 11/29/22 2313 11/30/22 0403 11/30/22 0724  GLUCAP 74 185* 125* 110* 157*   D-Dimer: No results for input(s): "DDIMER" in the last 72 hours. Hgb A1c: Recent Labs    11/29/22 0536  HGBA1C 9.0*   Lipid Profile: No results for input(s): "CHOL", "HDL", "LDLCALC", "TRIG", "CHOLHDL", "LDLDIRECT" in the last 72 hours. Thyroid function studies: No results for  input(s):  "TSH", "T4TOTAL", "T3FREE", "THYROIDAB" in the last 72 hours.  Invalid input(s): "FREET3" Anemia work up: No results for input(s): "VITAMINB12", "FOLATE", "FERRITIN", "TIBC", "IRON", "RETICCTPCT" in the last 72 hours. Sepsis Labs: Recent Labs  Lab 11/28/22 0536 11/28/22 1606 11/29/22 1137 11/30/22 0512  WBC 8.9 9.3 6.0 5.8    Microbiology Recent Results (from the past 240 hour(s))  MRSA Next Gen by PCR, Nasal     Status: None   Collection Time: 11/29/22  1:06 AM   Specimen: Nasal Mucosa; Nasal Swab  Result Value Ref Range Status   MRSA by PCR Next Gen NOT DETECTED NOT DETECTED Final    Comment: (NOTE) The GeneXpert MRSA Assay (FDA approved for NASAL specimens only), is one component of a comprehensive MRSA colonization surveillance program. It is not intended to diagnose MRSA infection nor to guide or monitor treatment for MRSA infections. Test performance is not FDA approved in patients less than 81 years old. Performed at [facility], [address] [place], [place] [address]     Procedures and diagnostic studies:  CARDIAC CATHETERIZATION  Result Date: 11/29/2022   Hemodynamic findings consistent with severe pulmonary hypertension. 1.  Pulmonary hypertension with PA pressure 77/33 with pulmonary arterial mean 50 mmHg 2.  Elevated pulmonary capillary wedge 31 mmHg consistent with systolic congestive heart failure 3.  Cardiac output 4.02, cardiac index 2.01 Recommendations 1.  Continue diuresis 2.  Blood pressure support as needed   ECHOCARDIOGRAM COMPLETE  Result Date: 11/29/2022    ECHOCARDIOGRAM REPORT   Patient Name:   MAKAY SCHEIDT Date of Exam: 11/29/2022 Medical Rec #:  [id number]       Height:       67.0 in Accession #:    [id number]      Weight:       195.1 lb Date of Birth:  11-Aug-1960       BSA:          2.001 m Patient Age:    61 years        BP:           96/67 mmHg Patient Gender: M               HR:           93 bpm. Exam Location:  [facility] Procedure: 2D  Echo, Cardiac Doppler, Color Doppler and Intracardiac            Opacification Agent Indications:     Dyspnea  History:         Patient has prior history of Echocardiogram examinations, most                  recent 09/10/2017. CHF, CAD, Prior CABG and Defibrillator, PAD                  and COPD; Risk Factors:Diabetes and Dyslipidemia. Shock.  Sonographer:     Mikki Harbor Referring Phys:  [id number] KHABIB DGAYLI Diagnosing Phys: Marcina Millard MD  Sonographer Comments: Technically difficult study due to poor echo windows. Image acquisition challenging due to COPD. IMPRESSIONS  1. Left ventricular ejection fraction, by estimation, is <20%. The left ventricle has severely decreased function. The left ventricle has no regional wall motion abnormalities. The left ventricular internal cavity size was severely dilated. Left ventricular diastolic parameters were normal.  2. Right ventricular systolic function is normal. The right ventricular size is normal. There is moderately elevated pulmonary artery systolic pressure.  3. The mitral  valve is normal in structure. Moderate mitral valve regurgitation. No evidence of mitral stenosis.  4. Tricuspid valve regurgitation is mild to moderate.  5. The aortic valve is normal in structure. Aortic valve regurgitation is not visualized. No aortic stenosis is present.  6. The inferior vena cava is normal in size with greater than 50% respiratory variability, suggesting right atrial pressure of 3 mmHg. FINDINGS  Left Ventricle: Left ventricular ejection fraction, by estimation, is <20%. The left ventricle has severely decreased function. The left ventricle has no regional wall motion abnormalities. Definity contrast agent was given IV to delineate the left ventricular endocardial borders. The left ventricular internal cavity size was severely dilated. There is no left ventricular hypertrophy. Left ventricular diastolic parameters were normal. Right Ventricle: The right  ventricular size is normal. No increase in right ventricular wall thickness. Right ventricular systolic function is normal. There is moderately elevated pulmonary artery systolic pressure. The tricuspid regurgitant velocity is 2.86 m/s, and with an assumed right atrial pressure of 15 mmHg, the estimated right ventricular systolic pressure is 47.7 mmHg. Left Atrium: Left atrial size was normal in size. Right Atrium: Right atrial size was normal in size. Pericardium: There is no evidence of pericardial effusion. Mitral Valve: The mitral valve is normal in structure. Moderate mitral valve regurgitation. No evidence of mitral valve stenosis. MV peak gradient, 4.1 mmHg. The mean mitral valve gradient is 2.0 mmHg. Tricuspid Valve: The tricuspid valve is normal in structure. Tricuspid valve regurgitation is mild to moderate. No evidence of tricuspid stenosis. Aortic Valve: The aortic valve is normal in structure. Aortic valve regurgitation is not visualized. No aortic stenosis is present. Aortic valve mean gradient measures 2.0 mmHg. Aortic valve peak gradient measures 3.3 mmHg. Aortic valve area, by VTI measures 2.04 cm. Pulmonic Valve: The pulmonic valve was normal in structure. Pulmonic valve regurgitation is not visualized. No evidence of pulmonic stenosis. Aorta: The aortic root is normal in size and structure. Venous: The inferior vena cava is normal in size with greater than 50% respiratory variability, suggesting right atrial pressure of 3 mmHg. IAS/Shunts: No atrial level shunt detected by color flow Doppler.  LEFT VENTRICLE PLAX 2D LVIDd:         6.10 cm   Diastology LVIDs:         5.90 cm   LV e' medial:    5.26 cm/s LV PW:         1.00 cm   LV E/e' medial:  15.7 LV IVS:        1.00 cm   LV e' lateral:   8.59 cm/s LVOT diam:     2.00 cm   LV E/e' lateral: 9.6 LV SV:         34 LV SV Index:   17 LVOT Area:     3.14 cm  RIGHT VENTRICLE RV Basal diam:  3.85 cm RV Mid diam:    4.10 cm RV S prime:     4.73 cm/s  TAPSE (M-mode): 1.0 cm LEFT ATRIUM           Index        RIGHT ATRIUM           Index LA diam:      4.50 cm 2.25 cm/m   RA Area:     17.30 cm LA Vol (A2C): 74.2 ml 37.08 ml/m  RA Volume:   48.50 ml  24.24 ml/m LA Vol (A4C): 76.6 ml 38.28 ml/m  AORTIC VALVE  PULMONIC VALVE AV Area (Vmax):    2.53 cm     PV Vmax:       0.80 m/s AV Area (Vmean):   2.31 cm     PV Peak grad:  2.5 mmHg AV Area (VTI):     2.04 cm AV Vmax:           90.40 cm/s AV Vmean:          62.300 cm/s AV VTI:            0.166 m AV Peak Grad:      3.3 mmHg AV Mean Grad:      2.0 mmHg LVOT Vmax:         72.70 cm/s LVOT Vmean:        45.900 cm/s LVOT VTI:          0.108 m LVOT/AV VTI ratio: 0.65  AORTA Ao Root diam: 2.70 cm MITRAL VALVE               TRICUSPID VALVE MV Area (PHT): 5.58 cm    TR Peak grad:   32.7 mmHg MV Area VTI:   1.75 cm    TR Vmax:        286.00 cm/s MV Peak grad:  4.1 mmHg MV Mean grad:  2.0 mmHg    SHUNTS MV Vmax:       1.01 m/s    Systemic VTI:  0.11 m MV Vmean:      60.7 cm/s   Systemic Diam: 2.00 cm MV Decel Time: 136 msec MV E velocity: 82.80 cm/s MV A velocity: 43.50 cm/s MV E/A ratio:  1.90 Marcina Millard MD Electronically signed by Marcina Millard MD Signature Date/Time: 11/29/2022/1:03:23 PM    Final    US Abdomen Limited RUQ (LIVER/GB)  Result Date: 11/29/2022 CLINICAL DATA:  Transaminitis. EXAM: ULTRASOUND ABDOMEN LIMITED RIGHT UPPER QUADRANT COMPARISON:  None Available. FINDINGS: Gallbladder: Several gallstones evident measuring up to 6 mm. Gallbladder wall is mildly thickened at 3-4 mm. Sonographer reports no sonographic Murphy sign. Trace fluid is seen adjacent to the liver. Common bile duct: Diameter: 6 mm Liver: Echogenic parenchyma with nodular contour. No focal parenchymal mass lesion. No intrahepatic biliary duct dilatation. Portal vein is patent on color Doppler imaging with normal direction of blood flow towards the liver. Other: As above, there is trace perihepatic ascites.  IMPRESSION: 1. Cholelithiasis with mild gallbladder wall thickening. Sonographer reports no sonographic Murphy sign. Gallbladder wall thickening can be nonspecific in the setting of liver disease. 2. Echogenic liver with nodular contour suggesting cirrhosis. 3. Trace perihepatic ascites. Electronically Signed   By: Kennith Center M.D.   On: 11/29/2022 09:41   DG Chest 2 View  Result Date: 11/28/2022 CLINICAL DATA:  Chest pain EXAM: CHEST - 2 VIEW COMPARISON:  X-ray 12/26/2020.  Report of a x-ray 11/08/2022 FINDINGS: Status post median sternotomy. Left upper chest defibrillator with leads along the right side of the enlarged heart. Increasing interstitial changes, possibly edema. Small pleural effusions. No pneumothorax. Overlapping cardiac leads. Osteopenia. IMPRESSION: Status post median sternotomy. Enlarged heart with vascular congestion and interstitial edema. Tiny pleural effusions. Defibrillator.  Recommend follow-up Electronically Signed   By: Karen Kays M.D.   On: 11/28/2022 16:46               LOS: 2 days   Kirstein Baxley  [facility]   Pager on [email or web address]. If 7PM-7AM, please contact night-coverage at [email or web address]     11/30/2022, 9:25 AM

## 2022-11-30 NOTE — Inpatient Diabetes Management (Signed)
Inpatient Diabetes Program Recommendations  AACE/ADA: New Consensus Statement on Inpatient Glycemic Control (2015)  Target Ranges:  Prepandial:   less than 140 mg/dL      Peak postprandial:   less than 180 mg/dL (1-2 hours)      Critically ill patients:  140 - 180 mg/dL   Lab Results  Component Value Date   GLUCAP 261 (H) 11/30/2022   HGBA1C 9.0 (H) 11/29/2022    Review of Glycemic Control  Latest Reference Range & Units 11/29/22 07:47 11/29/22 12:49 11/29/22 17:26 11/29/22 19:39 11/29/22 23:13 11/30/22 04:03 11/30/22 07:24 11/30/22 11:56  Glucose-Capillary 70 - 99 mg/dL 161 (H) 096 (H) 74 045 (H) 125 (H) 110 (H) 157 (H) 261 (H)   Diabetes history: DM 2 Outpatient Diabetes medications:  Metformin 500 mg bid Current orders for Inpatient glycemic control:  Novolog 0-15 units q 4 hours  Inpatient Diabetes Program Recommendations:    Consider changing Novolog to tid with meals and HS.  Note elevated A1C- Likely needs adjustment up of Metformin and possibly addition of SGLT-2?? Will follow.   Thanks,  Beryl Meager, RN, BC-ADM Inpatient Diabetes Coordinator Pager [phone]  (8a-5p)

## 2022-11-30 NOTE — Consult Note (Signed)
PHARMACY CONSULT NOTE - FOLLOW UP  Pharmacy Consult for Electrolyte Monitoring and Replacement   Recent Labs: Potassium (mmol/L)  Date Value  11/30/2022 3.9  10/22/2013 4.5   Magnesium (mg/dL)  Date Value  96/10/5407 2.7 (H)   Calcium (mg/dL)  Date Value  81/19/1478 8.9   Calcium, Total (mg/dL)  Date Value  29/56/2130 8.0 (L)   Albumin (g/dL)  Date Value  86/57/8469 3.3 (L)  10/20/2013 3.5   Phosphorus (mg/dL)  Date Value  62/95/2841 3.6   Sodium (mmol/L)  Date Value  11/30/2022 134 (L)  10/22/2013 134 (L)     Assessment: 62 yo M presenting to [facility] ED from home for evaluation of progressive shortness of breath, weight gain and bilateral lower extremity edema. Patient was intubated and transferred to ICU. Pharmacy has been consulted to monitor and replace electrolytes while under PCCM care.  Goal of Therapy:  Electrolytes WNL  Plan:  No replacement currently indicated Will recheck electrolytes with AM labs  Bettey Costa ,PharmD Clinical Pharmacist 11/30/2022 9:24 AM

## 2022-11-30 NOTE — TOC CM/SW Note (Signed)
  Transition of Care [facility]) Screening Note   Patient Details  Name: [name] Date of Birth: 1961-05-13   Transition of Care [facility]) CM/SW Contact:    Margarito Liner, LCSW Phone Number: 11/30/2022, 9:33 AM    Transition of Care Department [facility]) has reviewed patient and no TOC needs have been identified at this time. We will continue to monitor patient advancement through interdisciplinary progression rounds. If new patient transition needs arise, please place a TOC consult.

## 2022-12-01 DIAGNOSIS — I472 Ventricular tachycardia, unspecified: Secondary | ICD-10-CM | POA: Insufficient documentation

## 2022-12-01 DIAGNOSIS — K7460 Unspecified cirrhosis of liver: Secondary | ICD-10-CM | POA: Insufficient documentation

## 2022-12-01 DIAGNOSIS — I5023 Acute on chronic systolic (congestive) heart failure: Secondary | ICD-10-CM | POA: Diagnosis present

## 2022-12-01 DIAGNOSIS — R570 Cardiogenic shock: Secondary | ICD-10-CM | POA: Diagnosis not present

## 2022-12-01 LAB — BASIC METABOLIC PANEL
Anion gap: 17 — ABNORMAL HIGH (ref 5–15)
BUN: 52 mg/dL — ABNORMAL HIGH (ref 8–23)
CO2: 24 mmol/L (ref 22–32)
Calcium: 8.9 mg/dL (ref 8.9–10.3)
Chloride: 89 mmol/L — ABNORMAL LOW (ref 98–111)
Creatinine, Ser: 1.35 mg/dL — ABNORMAL HIGH (ref 0.61–1.24)
GFR, Estimated: 59 mL/min — ABNORMAL LOW (ref >60)
Glucose, Bld: 181 mg/dL — ABNORMAL HIGH (ref 70–99)
Potassium: 3.6 mmol/L (ref 3.5–5.1)
Sodium: 130 mmol/L — ABNORMAL LOW (ref 135–145)

## 2022-12-01 LAB — GLUCOSE, CAPILLARY
Glucose-Capillary: 101 mg/dL — ABNORMAL HIGH (ref 70–99)
Glucose-Capillary: 161 mg/dL — ABNORMAL HIGH (ref 70–99)
Glucose-Capillary: 168 mg/dL — ABNORMAL HIGH (ref 70–99)
Glucose-Capillary: 176 mg/dL — ABNORMAL HIGH (ref 70–99)
Glucose-Capillary: 250 mg/dL — ABNORMAL HIGH (ref 70–99)
Glucose-Capillary: 265 mg/dL — ABNORMAL HIGH (ref 70–99)

## 2022-12-01 LAB — MAGNESIUM: Magnesium: 2.3 mg/dL (ref 1.7–2.4)

## 2022-12-01 MED ORDER — SALINE SPRAY 0.65 % NA SOLN
1.0000 | NASAL | Status: DC | PRN
  Administered 2022-12-01 – 2022-12-02 (×2): 1 via NASAL
  Filled 2022-12-01: qty 44

## 2022-12-01 MED ORDER — METOLAZONE 5 MG PO TABS
5.0000 mg | ORAL_TABLET | Freq: Once | ORAL | Status: AC
  Administered 2022-12-01: 5 mg via ORAL
  Filled 2022-12-01: qty 1

## 2022-12-01 MED ORDER — POTASSIUM CHLORIDE CRYS ER 20 MEQ PO TBCR
40.0000 meq | EXTENDED_RELEASE_TABLET | Freq: Once | ORAL | Status: AC
  Administered 2022-12-01: 40 meq via ORAL
  Filled 2022-12-01: qty 2

## 2022-12-01 MED ORDER — PRAVASTATIN SODIUM 40 MG PO TABS
40.0000 mg | ORAL_TABLET | ORAL | Status: DC
  Administered 2022-12-01 – 2022-12-07 (×4): 40 mg via ORAL
  Filled 2022-12-01 (×2): qty 2
  Filled 2022-12-01: qty 1
  Filled 2022-12-01: qty 2

## 2022-12-01 MED ORDER — POTASSIUM CHLORIDE CRYS ER 20 MEQ PO TBCR
20.0000 meq | EXTENDED_RELEASE_TABLET | Freq: Once | ORAL | Status: AC
  Administered 2022-12-01: 20 meq via ORAL
  Filled 2022-12-01: qty 1

## 2022-12-01 MED ORDER — AMIODARONE HCL 200 MG PO TABS
400.0000 mg | ORAL_TABLET | Freq: Two times a day (BID) | ORAL | Status: DC
  Administered 2022-12-01 – 2022-12-07 (×13): 400 mg via ORAL
  Filled 2022-12-01 (×13): qty 2

## 2022-12-01 MED ORDER — AMIODARONE HCL 200 MG PO TABS: 200.0000 mg | ORAL_TABLET | Freq: Every day | ORAL | Status: DC

## 2022-12-01 NOTE — Progress Notes (Signed)
[facility] CLINIC CARDIOLOGY CONSULT NOTE       Patient ID: [name] MRN: [id number] DOB/AGE: [birth year] 62 y.o.  Admit date: 11/28/2022 Referring Physician Eual Fines, NP  Primary Physician Dr. Marcello Fennel Primary Cardiologist Dr. Darrold Junker Reason for Consultation AoCHF  HPI: [name] ia a [phone] with a PMH of HFrEF (20%), s/p primary prevention ICD 07/2022, CAD s/p CABG x 3 (LIMA-LAD, SVG-OM2, SVG-PDA 12/09/2013), ILD/chronic respiratory failure (2L), DM2 who presented initially to [facility] Cardiology clinic with 2 months of worsening dyspnea on exertion, ~13 pound weight gain in the past week and abdominal distention despite taking metolazone. He was referred directly to [facility] ED, where he was hypotensive to 70/30 and hypoxic, requiring levophed & 2L by [place]. Cardiology consulted for assistance with his CHF exacerbation.   Interval History:  -Noted some transient nausea around lunchtime yesterday afternoon, no recurrence today, tolerated breakfast okay -Continues to deny chest pain, shortness of breath and overall volume status much improving -Weight has decreased 12 pounds since admission, continues to diurese well on a Lasix infusion, remains on dopamine for blood pressure support   Review of systems complete and found to be negative unless listed above     Past Medical History:  Diagnosis Date   Cancer (HCC)    skin (scalp)   CHF (congestive heart failure) (HCC)    COPD (chronic obstructive pulmonary disease) (HCC)    Coronary artery disease    Coronary artery disease    Diabetes mellitus without complication (HCC)    Dyspnea    Fibromyalgia    History of kidney stones    asymptomatic   IPF (idiopathic pulmonary fibrosis) (HCC)    MI (myocardial infarction) (HCC)    Peripheral vascular disease (HCC)    Pneumonia     Past Surgical History:  Procedure Laterality Date   APPLICATION OF WOUND VAC     chest wound s/p CABG   CARDIAC CATHETERIZATION     CARDIAC  DEFIBRILLATOR PLACEMENT  08/15/2022   CORONARY ARTERY BYPASS GRAFT  2015   3 vessels   ENDARTERECTOMY Left 09/08/2017   Procedure: ENDARTERECTOMY CAROTID;  Surgeon: Renford Dills, MD;  Location: [facility] ORS;  Service: Vascular;  Laterality: Left;   LOWER EXTREMITY ANGIOGRAPHY Right 01/29/2019   Procedure: LOWER EXTREMITY ANGIOGRAPHY;  Surgeon: Renford Dills, MD;  Location: [facility] INVASIVE CV LAB;  Service: Cardiovascular;  Laterality: Right;   PERIPHERAL VASCULAR CATHETERIZATION N/A 06/09/2015   Procedure: Abdominal Aortogram w/Lower Extremity;  Surgeon: Renford Dills, MD;  Location: [facility] INVASIVE CV LAB;  Service: Cardiovascular;  Laterality: N/A;   PERIPHERAL VASCULAR CATHETERIZATION  06/09/2015   Procedure: Lower Extremity Intervention;  Surgeon: Renford Dills, MD;  Location: [facility] INVASIVE CV LAB;  Service: Cardiovascular;;   RIGHT HEART CATH N/A 11/29/2022   Procedure: RIGHT HEART CATH;  Surgeon: Marcina Millard, MD;  Location: [facility] INVASIVE CV LAB;  Service: Cardiovascular;  Laterality: N/A;   stent in leg Left    WOUND DEBRIDEMENT     debridement of chest wound x 2 s/p CABG     Medications Prior to Admission  Medication Sig Dispense Refill Last Dose   albuterol (PROVENTIL HFA;VENTOLIN HFA) 108 (90 Base) MCG/ACT inhaler Inhale 2 puffs into the lungs every 6 (six) hours as needed for wheezing or shortness of breath.   unk at unk   aspirin EC 81 MG tablet Take 81 mg by mouth daily.   Past Week   Buprenorphine HCl (BELBUCA) 450 MCG FILM Place 1  Film (450 mcg total) inside cheek every 12 (twelve) hours. 60 each 2 11/27/2022   carvedilol (COREG) 3.125 MG tablet Take by mouth.   11/28/2022   furosemide (LASIX) 40 MG tablet Take 40 mg by mouth 2 (two) times daily.   11/28/2022   HYDROcodone-acetaminophen (NORCO) 10-325 MG tablet Take 1 tablet by mouth every 8 (eight) hours as needed for severe pain. Must last 30 days. 90 tablet 0 11/27/2022   metFORMIN (GLUCOPHAGE) 500 MG tablet Take  500 mg by mouth 2 (two) times daily with a meal.   Past Month   metolazone (ZAROXOLYN) 5 MG tablet Take 5 mg by mouth daily as needed.   11/28/2022   Pirfenidone (ESBRIET) 267 MG CAPS Take 801 mg by mouth 2 (two) times daily.    11/28/2022   pravastatin (PRAVACHOL) 40 MG tablet Take 40 mg by mouth every other day.    11/27/2022   sodium chloride (OCEAN) 0.65 % nasal spray Place 1 spray into the nose as needed (dryness).   unk at unk   tamsulosin (FLOMAX) 0.4 MG CAPS capsule Take 0.4 mg by mouth every evening.  3 Past Month   acetaminophen (TYLENOL) 500 MG tablet Take 1 tablet (500 mg total) by mouth every 8 (eight) hours as needed for moderate pain or headache. (Patient not taking: Reported on 11/28/2022) 90 tablet 2 Not Taking   HYDROcodone-acetaminophen (NORCO) 10-325 MG tablet Take 1 tablet by mouth every 8 (eight) hours as needed for severe pain. Must last 30 days. 90 tablet 0    HYDROcodone-acetaminophen (NORCO) 10-325 MG tablet Take 1 tablet by mouth every 8 (eight) hours as needed for severe pain. Must last 30 days. 90 tablet 0    spironolactone (ALDACTONE) 25 MG tablet Take 1 tablet by mouth as needed. (Patient not taking: Reported on 11/28/2022)   Not Taking   Social History   Socioeconomic History   Marital status: Married    Spouse name: jerri   Number of children: 2   Years of education: Not on file   Highest education level: Some college, no degree  Occupational History   Not on file  Tobacco Use   Smoking status: Former    Types: Cigarettes    Quit date: 10/20/2013    Years since quitting: 9.1   Smokeless tobacco: Former  Building services engineer Use: Never used  Substance and Sexual Activity   Alcohol use: Yes    Comment: seldom   Drug use: No   Sexual activity: Not on file  Other Topics Concern   Not on file  Social History Narrative   Not on file   Social Determinants of Health   Financial Resource Strain: Low Risk  (01/28/2019)   Overall Financial Resource Strain (CARDIA)     Difficulty of Paying Living Expenses: Not hard at all  Food Insecurity: No Food Insecurity (01/28/2019)   Hunger Vital Sign    Worried About Running Out of Food in the Last Year: Never true    Ran Out of Food in the Last Year: Never true  Transportation Needs: No Transportation Needs (01/28/2019)   PRAPARE - Administrator, Civil Service (Medical): No    Lack of Transportation (Non-Medical): No  Physical Activity: Sufficiently Active (01/28/2019)   Exercise Vital Sign    Days of Exercise per Week: 5 days    Minutes of Exercise per Session: 30 min  Stress: Stress Concern Present (01/28/2019)   [employer] of Occupational Health -  Occupational Stress Questionnaire    Feeling of Stress : Very much  Social Connections: Unknown (01/28/2019)   Social Connection and Isolation Panel [NHANES]    Frequency of Communication with Friends and Family: Not on file    Frequency of Social Gatherings with Friends and Family: Not on file    Attends Religious Services: Never    Active Member of Clubs or Organizations: No    Attends Banker Meetings: Never    Marital Status: Married  Catering manager Violence: Not At Risk (01/28/2019)   Humiliation, Afraid, Rape, and Kick questionnaire    Fear of Current or Ex-Partner: No    Emotionally Abused: No    Physically Abused: No    Sexually Abused: No    Family History  Problem Relation Age of Onset   Heart attack Mother    Cancer Father    Mental illness Neg Hx       Intake/Output Summary (Last 24 hours) at 12/01/2022 1436 Last data filed at 12/01/2022 1243 Gross per 24 hour  Intake 1079.9 ml  Output 2220 ml  Net -1140.1 ml    Vitals:   12/01/22 1200 12/01/22 1230 12/01/22 1300 12/01/22 1305  BP: 95/67 (!) 85/76  (!) 98/59  Pulse: (!) 103 (!) 106 (!) 102 (!) 102  Resp: 17 19 15 18   Temp: 97.8 F (36.6 C)     TempSrc: Oral     SpO2: 98% 98% 96% 98%  Weight:      Height:        PHYSICAL EXAM General: ill appearing  caucasian male, appears older than chronological age in no acute distress.  Sitting upright in stepdown bed with wife and daughter at bedside  HEENT:  Normocephalic and atraumatic. Neck:  No JVD.  Lungs: Normal respiratory effort on 2L [place]. Rhonchi in bases with decreased breath sounds in upper lung fields no wheezing.   Heart: Tacky but regular, less ectopy. Normal S1 and S2 without gallops or murmurs.  Abdomen: soft, nontender, mildly distended but improving Msk: Normal strength and tone for age. Extremities: Feet are cool to touch, chronic brawny hyperpigmentation with 1+ bilateral LE edema Neuro: Alert and oriented X 3. Psych:  Answers questions appropriately.   Labs: Basic Metabolic Panel: Recent Labs    11/30/22 0512 12/01/22 0647  NA 134* 130*  K 3.9 3.6  CL 92* 89*  CO2 30 24  GLUCOSE 131* 181*  BUN 54* 52*  CREATININE 1.30* 1.35*  CALCIUM 8.9 8.9  MG 2.7* 2.3  PHOS 3.6  --    Liver Function Tests: Recent Labs    11/28/22 1855 11/30/22 0512  AST 42* 33  ALT 177* 125*  ALKPHOS 67 69  BILITOT 1.5* 1.5*  PROT 7.5 7.6  ALBUMIN 3.3* 3.5   No results for input(s): "LIPASE", "AMYLASE" in the last 72 hours. CBC: Recent Labs    11/29/22 1137 11/29/22 1602 11/29/22 1607 11/30/22 0512  WBC 6.0  --   --  5.8  HGB 11.6*   < > 12.9* 12.0*  HCT 37.5*   < > 38.0* 37.9*  MCV 97.2  --   --  94.8  PLT 189  --   --  216   < > = values in this interval not displayed.   Cardiac Enzymes: Recent Labs    11/28/22 1606 11/28/22 1855  TROPONINIHS 19* 17   BNP: Recent Labs    11/28/22 1613  BNP 1,280.6*   D-Dimer: No results for input(s): "DDIMER"  in the last 72 hours. Hemoglobin A1C: Recent Labs    11/29/22 0536  HGBA1C 9.0*   Fasting Lipid Panel: No results for input(s): "CHOL", "HDL", "LDLCALC", "TRIG", "CHOLHDL", "LDLDIRECT" in the last 72 hours. Thyroid Function Tests: No results for input(s): "TSH", "T4TOTAL", "T3FREE", "THYROIDAB" in the last 72  hours.  Invalid input(s): "FREET3" Anemia Panel: No results for input(s): "VITAMINB12", "FOLATE", "FERRITIN", "TIBC", "IRON", "RETICCTPCT" in the last 72 hours.   Radiology: CARDIAC CATHETERIZATION  Result Date: 11/29/2022   Hemodynamic findings consistent with severe pulmonary hypertension. 1.  Pulmonary hypertension with PA pressure 77/33 with pulmonary arterial mean 50 mmHg 2.  Elevated pulmonary capillary wedge 31 mmHg consistent with systolic congestive heart failure 3.  Cardiac output 4.02, cardiac index 2.01 Recommendations 1.  Continue diuresis 2.  Blood pressure support as needed   ECHOCARDIOGRAM COMPLETE  Result Date: 11/29/2022    ECHOCARDIOGRAM REPORT   Patient Name:   [name] Date of Exam: 11/29/2022 Medical Rec #:  [id number]       Height:       67.0 in Accession #:    [id number]      Weight:       195.1 lb Date of Birth:  24-Dec-1960       BSA:          2.001 m Patient Age:    61 years        BP:           96/67 mmHg Patient Gender: M               HR:           93 bpm. Exam Location:  [facility] Procedure: 2D Echo, Cardiac Doppler, Color Doppler and Intracardiac            Opacification Agent Indications:     Dyspnea  History:         Patient has prior history of Echocardiogram examinations, most                  recent 09/10/2017. CHF, CAD, Prior CABG and Defibrillator, PAD                  and COPD; Risk Factors:Diabetes and Dyslipidemia. Shock.  Sonographer:     Mikki Harbor Referring Phys:  [id number] KHABIB DGAYLI Diagnosing Phys: Marcina Millard MD  Sonographer Comments: Technically difficult study due to poor echo windows. Image acquisition challenging due to COPD. IMPRESSIONS  1. Left ventricular ejection fraction, by estimation, is <20%. The left ventricle has severely decreased function. The left ventricle has no regional wall motion abnormalities. The left ventricular internal cavity size was severely dilated. Left ventricular diastolic parameters were normal.  2. Right  ventricular systolic function is normal. The right ventricular size is normal. There is moderately elevated pulmonary artery systolic pressure.  3. The mitral valve is normal in structure. Moderate mitral valve regurgitation. No evidence of mitral stenosis.  4. Tricuspid valve regurgitation is mild to moderate.  5. The aortic valve is normal in structure. Aortic valve regurgitation is not visualized. No aortic stenosis is present.  6. The inferior vena cava is normal in size with greater than 50% respiratory variability, suggesting right atrial pressure of 3 mmHg. FINDINGS  Left Ventricle: Left ventricular ejection fraction, by estimation, is <20%. The left ventricle has severely decreased function. The left ventricle has no regional wall motion abnormalities. Definity contrast agent was given IV to delineate the left ventricular endocardial borders. The  left ventricular internal cavity size was severely dilated. There is no left ventricular hypertrophy. Left ventricular diastolic parameters were normal. Right Ventricle: The right ventricular size is normal. No increase in right ventricular wall thickness. Right ventricular systolic function is normal. There is moderately elevated pulmonary artery systolic pressure. The tricuspid regurgitant velocity is 2.86 m/s, and with an assumed right atrial pressure of 15 mmHg, the estimated right ventricular systolic pressure is 47.7 mmHg. Left Atrium: Left atrial size was normal in size. Right Atrium: Right atrial size was normal in size. Pericardium: There is no evidence of pericardial effusion. Mitral Valve: The mitral valve is normal in structure. Moderate mitral valve regurgitation. No evidence of mitral valve stenosis. MV peak gradient, 4.1 mmHg. The mean mitral valve gradient is 2.0 mmHg. Tricuspid Valve: The tricuspid valve is normal in structure. Tricuspid valve regurgitation is mild to moderate. No evidence of tricuspid stenosis. Aortic Valve: The aortic valve is  normal in structure. Aortic valve regurgitation is not visualized. No aortic stenosis is present. Aortic valve mean gradient measures 2.0 mmHg. Aortic valve peak gradient measures 3.3 mmHg. Aortic valve area, by VTI measures 2.04 cm. Pulmonic Valve: The pulmonic valve was normal in structure. Pulmonic valve regurgitation is not visualized. No evidence of pulmonic stenosis. Aorta: The aortic root is normal in size and structure. Venous: The inferior vena cava is normal in size with greater than 50% respiratory variability, suggesting right atrial pressure of 3 mmHg. IAS/Shunts: No atrial level shunt detected by color flow Doppler.  LEFT VENTRICLE PLAX 2D LVIDd:         6.10 cm   Diastology LVIDs:         5.90 cm   LV e' medial:    5.26 cm/s LV PW:         1.00 cm   LV E/e' medial:  15.7 LV IVS:        1.00 cm   LV e' lateral:   8.59 cm/s LVOT diam:     2.00 cm   LV E/e' lateral: 9.6 LV SV:         34 LV SV Index:   17 LVOT Area:     3.14 cm  RIGHT VENTRICLE RV Basal diam:  3.85 cm RV Mid diam:    4.10 cm RV S prime:     4.73 cm/s TAPSE (M-mode): 1.0 cm LEFT ATRIUM           Index        RIGHT ATRIUM           Index LA diam:      4.50 cm 2.25 cm/m   RA Area:     17.30 cm LA Vol (A2C): 74.2 ml 37.08 ml/m  RA Volume:   48.50 ml  24.24 ml/m LA Vol (A4C): 76.6 ml 38.28 ml/m  AORTIC VALVE                    PULMONIC VALVE AV Area (Vmax):    2.53 cm     PV Vmax:       0.80 m/s AV Area (Vmean):   2.31 cm     PV Peak grad:  2.5 mmHg AV Area (VTI):     2.04 cm AV Vmax:           90.40 cm/s AV Vmean:          62.300 cm/s AV VTI:            0.166 m AV  Peak Grad:      3.3 mmHg AV Mean Grad:      2.0 mmHg LVOT Vmax:         72.70 cm/s LVOT Vmean:        45.900 cm/s LVOT VTI:          0.108 m LVOT/AV VTI ratio: 0.65  AORTA Ao Root diam: 2.70 cm MITRAL VALVE               TRICUSPID VALVE MV Area (PHT): 5.58 cm    TR Peak grad:   32.7 mmHg MV Area VTI:   1.75 cm    TR Vmax:        286.00 cm/s MV Peak grad:  4.1 mmHg MV  Mean grad:  2.0 mmHg    SHUNTS MV Vmax:       1.01 m/s    Systemic VTI:  0.11 m MV Vmean:      60.7 cm/s   Systemic Diam: 2.00 cm MV Decel Time: 136 msec MV E velocity: 82.80 cm/s MV A velocity: 43.50 cm/s MV E/A ratio:  1.90 Marcina Millard MD Electronically signed by Marcina Millard MD Signature Date/Time: 11/29/2022/1:03:23 PM    Final    US Abdomen Limited RUQ (LIVER/GB)  Result Date: 11/29/2022 CLINICAL DATA:  Transaminitis. EXAM: ULTRASOUND ABDOMEN LIMITED RIGHT UPPER QUADRANT COMPARISON:  None Available. FINDINGS: Gallbladder: Several gallstones evident measuring up to 6 mm. Gallbladder wall is mildly thickened at 3-4 mm. Sonographer reports no sonographic Murphy sign. Trace fluid is seen adjacent to the liver. Common bile duct: Diameter: 6 mm Liver: Echogenic parenchyma with nodular contour. No focal parenchymal mass lesion. No intrahepatic biliary duct dilatation. Portal vein is patent on color Doppler imaging with normal direction of blood flow towards the liver. Other: As above, there is trace perihepatic ascites. IMPRESSION: 1. Cholelithiasis with mild gallbladder wall thickening. Sonographer reports no sonographic Murphy sign. Gallbladder wall thickening can be nonspecific in the setting of liver disease. 2. Echogenic liver with nodular contour suggesting cirrhosis. 3. Trace perihepatic ascites. Electronically Signed   By: Kennith Center M.D.   On: 11/29/2022 09:41   DG Chest 2 View  Result Date: 11/28/2022 CLINICAL DATA:  Chest pain EXAM: CHEST - 2 VIEW COMPARISON:  X-ray 12/26/2020.  Report of a x-ray 11/08/2022 FINDINGS: Status post median sternotomy. Left upper chest defibrillator with leads along the right side of the enlarged heart. Increasing interstitial changes, possibly edema. Small pleural effusions. No pneumothorax. Overlapping cardiac leads. Osteopenia. IMPRESSION: Status post median sternotomy. Enlarged heart with vascular congestion and interstitial edema. Tiny pleural  effusions. Defibrillator.  Recommend follow-up Electronically Signed   By: Karen Kays M.D.   On: 11/28/2022 16:46    ECHO 05/20/2022 INTERPRETATION  SEVERE LV SYSTOLIC DYSFUNCTION (See above)  MILD RV SYSTOLIC DYSFUNCTION (See above)  MILD VALVULAR REGURGITATION (See above)  NO VALVULAR STENOSIS  Contraction: SEVERE GLOBAL DECREASE  Closest EF: 20% (Estimated)  Aortic: TRIVIAL AR  Mitral: MILD MR  Tricuspid: MILD TR  _________________________________________________________________________________________  Electronically signed by            MD Mariel Kansky on 05/20/2022 09: 54 AM           Performed By: Luretha Murphy, RDCS, RVT     Ordering Physician: Lady Gary, MD [place] reviewed by me (LT) 12/01/2022 : Sinus tach rate low 100s  EKG reviewed by me: NSR rate 84 incomplete RBBB with chronic TW flattening laterally   Data reviewed by me (  LT) 12/01/2022: Hospitalist note, nursing notes last 24h vitals tele labs imaging I/O    Principal Problem:   Cardiogenic shock (HCC) Active Problems:   PAD (peripheral artery disease) (HCC)   CAD (coronary artery disease)   DM (diabetes mellitus) (HCC)   Acute on chronic systolic CHF (congestive heart failure) (HCC)   Ventricular tachycardia (HCC)   Liver cirrhosis (HCC)    ASSESSMENT AND PLAN:  [name] ia a [phone] with a PMH of HFrEF (20%), s/p primary prevention ICD 07/2022, CAD s/p CABG x 3 (LIMA-LAD, SVG-OM2, SVG-PDA 12/09/2013), ILD/chronic respiratory failure (2L), DM2 who presented initially to [facility] Cardiology clinic with 2 months of worsening dyspnea on exertion, ~13 pound weight gain in the past week and abdominal distention despite taking metolazone. He was referred directly to [facility] ED, where he was hypotensive to 70/30 and hypoxic, requiring levophed & 2L by [place]. Cardiology consulted for assistance with his CHF exacerbation.   # cardiogenic shock  # acute on chronic HFrEF  # s/p primary prevention ICD  Echo this admission shows  EF < 20% with severely dilated LV, moderately elevated PASP, moderate MR, mi-mod TR. BNP elevated at 1200, CXR with pulmonary edema and pleural effusions. ~ 20-25lbs above dry weight on admission. Discussed case with Dr. Cheral [facility] HF) who recommended RHC to assess hemodynamics to help guide management. RHC with PA pressure 77/33, PA mean , PCWP c/w systolic CHF, CO 6.04, CI 2.01.  -Continue dopamine for BP support to assist with diuresis for today -Continue lasix gtt at 10mg /hr -Give metolazone 5 mg x 1 today - hold coreg with borderline low BP. ARB/ARNI, SGLT2i, MRA limited by BP historically. Will aim to add as BP and renal function allow.  - salt and fluid restriction, strict I/O  - echo resulted with EF <20%,  severe LV dilation, mod MR, mild-mod TR  # sustained VT Noted on tele for 45secs this AM, with frequent ectopy  -S/p 1 g load of IV amiodarone  - start amiodarone PO 400mg  BID x 10 days today for a goal load ~8 grams - restart BB as BP tolerates, likely after IV diuresis    # acute on chronic respiratory failure  # hx ILD  Intermittent 2L at home, using more frequently as he's been more short of breath from the above.   # CAD s/p CABG  Chest pain free, EKG non ischemic. Troponin 19, 17. Continue aspirin 81mg  daily + home pravastatin 40mg  every other day.   # poorly controlled DM2 A1C 9%. Could benefit for SGLT2i, GLP-1 as above.   This patient's plan of care was discussed and created with Dr. Darrold Junker and he is in agreement.  Signed: Rebeca Allegra , PA-C 12/01/2022, 2:36 PM [facility] Cardiology

## 2022-12-01 NOTE — Plan of Care (Signed)
  Problem: Education: Goal: Knowledge of General Education information will improve Description: Including pain rating scale, medication(s)/side effects and non-pharmacologic comfort measures Outcome: Progressing   Problem: Health Behavior/Discharge Planning: Goal: Ability to manage health-related needs will improve Outcome: Progressing   Problem: Clinical Measurements: Goal: Ability to maintain clinical measurements within normal limits will improve Outcome: Progressing Goal: Will remain free from infection Outcome: Progressing   Problem: Safety: Goal: Ability to remain free from injury will improve Outcome: Progressing   

## 2022-12-01 NOTE — Progress Notes (Addendum)
Progress Note    [name]  ZOX:[id number] DOB: 1960-10-07  DOA: 11/28/2022 PCP: Barbette Reichmann, MD      Brief Narrative:    Medical records reviewed and are as summarized below:  [name] is a 62 y.o. male PMH of HFrEF (20%), s/p primary prevention ICD 07/2022, CAD s/p CABG x 3 (LIMA-LAD, SVG-OM2, SVG-PDA 12/09/2013), ILD/chronic respiratory failure (2L), DM2, who presented to the hospital with shortness of breath, chest tightness and decreased appetite.        Assessment/Plan:   Principal Problem:   Cardiogenic shock (HCC) Active Problems:   PAD (peripheral artery disease) (HCC)   CAD (coronary artery disease)   DM (diabetes mellitus) (HCC)   Acute on chronic systolic CHF (congestive heart failure) (HCC)   Ventricular tachycardia (HCC)   Liver cirrhosis (HCC)   Body mass index is 28.8 kg/m.   Cardiogenic shock: He is still on IV dopamine drip for hypotension.   Acute on chronic systolic CHF, s/p AICD: Continue IV Lasix drip.  Monitor BMP, daily weight and urine output.  2D echo showed EF estimated at 20%. S/p right heart cath on 12/09/2022.  Follow-up with cardiologist for further recommendations.   Ventricular tachycardia: Amiodarone drip has been converted to oral amiodarone.   CAD s/p CABG: Continue aspirin   Type II DM with hyperglycemia: NovoLog as needed for hyperglycemia.   Interstitial lung disease, chronic hypoxic respiratory failure: Continue 2 L/min oxygen via [place].  His wife said ILD was attributed to COVID-19 infection   CKD stage IIIa: Creatinine is stable.  Baseline creatinine is around 1.5.   Liver cirrhosis: Abdominal ultrasound showed liver cirrhosis, cholelithiasis. Patient and his wife said they were not aware of liver disease.  I went over the diagnosis and management of liver cirrhosis.  Outpatient follow-up with gastroenterologist recommended.   Chronic hyponatremia: Likely from liver disease and CHF   Diet  Order             Diet Heart Room service appropriate? Yes; Fluid consistency: Thin  Diet effective now                            Consultants: Cardiologist Intensivist  Procedures: S/p right heart cath    Medications:    amiodarone  400 mg Oral BID   Followed by   Melene Muller ON 12/11/2022] amiodarone  200 mg Oral Daily   aspirin EC  81 mg Oral Daily   Chlorhexidine Gluconate Cloth  6 each Topical Daily   enoxaparin (LOVENOX) injection  40 mg Subcutaneous Q24H   insulin aspart  0-15 Units Subcutaneous Q4H   multivitamin with minerals  1 tablet Oral Daily   oxyCODONE  10 mg Oral Q6H   pravastatin  40 mg Oral QODAY   Ensure Max Protein  11 oz Oral Daily   sodium chloride flush  3 mL Intravenous Q12H   sodium chloride flush  3 mL Intravenous Q12H   Continuous Infusions:  sodium chloride     DOPamine 10 mcg/kg/min (12/01/22 0900)   furosemide (LASIX) 200 mg in dextrose 5 % 100 mL (2 mg/mL) infusion 10 mg/hr (12/01/22 0900)     Anti-infectives (From admission, onward)    None              Family Communication/Anticipated D/C date and plan/Code Status   DVT prophylaxis: Place TED hose Start: 12/01/22 1057 enoxaparin (LOVENOX) injection 40 mg Start: 11/29/22 0800  SCDs Start: 11/28/22 2259     Code Status: Full Code  Family Communication: Plan discussed with his wife at the bedside Disposition Plan: Plan to discharge home in 3 to 4 days   Status is: Inpatient Remains inpatient appropriate because: CHF exacerbation       Subjective:   Interval events noted.  No new complaints.  No shortness of breath or chest pain.  He still has swelling in the legs.  His wife was at the bedside.  Objective:    Vitals:   12/01/22 1200 12/01/22 1230 12/01/22 1300 12/01/22 1305  BP: 95/67 (!) 85/76  (!) 98/59  Pulse: (!) 103 (!) 106 (!) 102 (!) 102  Resp: 17 19 15 18   Temp: 97.8 F (36.6 C)     TempSrc: Oral     SpO2: 98% 98% 96% 98%  Weight:       Height:       No data found.   Intake/Output Summary (Last 24 hours) at 12/01/2022 1407 Last data filed at 12/01/2022 1243 Gross per 24 hour  Intake 1079.9 ml  Output 2220 ml  Net -1140.1 ml   Filed Weights   11/29/22 1330 11/30/22 0800 12/01/22 0600  Weight: 88.7 kg 85.7 kg 83.4 kg    Exam:   GEN: NAD SKIN: Warm and dry EYES: No pallor or icterus ENT: MMM CV: RRR PULM: Bibasilar rales ABD: soft, ND, NT, +BS CNS: AAO x 3, non focal EXT: Bilateral lower extremity edema from the thighs to the feet, no tenderness      Data Reviewed:   I have personally reviewed following labs and imaging studies:  Labs: Labs show the following:   Basic Metabolic Panel: Recent Labs  Lab 11/28/22 0536 11/28/22 1606 11/28/22 1855 11/29/22 1137 11/29/22 1602 11/29/22 1607 11/30/22 0512 12/01/22 0647  NA 132*  --  130* 132* 133* 134* 134* 130*  K 3.8  --  4.5 4.0 3.8 3.9 3.9 3.6  CL 95*  --  93* 92*  --   --  92* 89*  CO2 24  --  23 26  --   --  30 24  GLUCOSE 170*  --  256* 186*  --   --  131* 181*  BUN 56* 53* QUANTITY NOT SUFFICIENT, UNABLE TO PERFORM TEST 56*  --   --  54* 52*  CREATININE 1.44* 1.53* QUANTITY NOT SUFFICIENT, UNABLE TO PERFORM TEST 1.51*  --   --  1.30* 1.35*  CALCIUM 8.4*  --  8.3* 8.6*  --   --  8.9 8.9  MG 2.3  --   --  2.3  --   --  2.7* 2.3  PHOS 2.8  --   --   --   --   --  3.6  --    GFR Estimated Creatinine Clearance: 57.8 mL/min (A) (by C-G formula based on SCr of 1.35 mg/dL (H)). Liver Function Tests: Recent Labs  Lab 11/28/22 1855 11/30/22 0512  AST 42* 33  ALT 177* 125*  ALKPHOS 67 69  BILITOT 1.5* 1.5*  PROT 7.5 7.6  ALBUMIN 3.3* 3.5   No results for input(s): "LIPASE", "AMYLASE" in the last 168 hours. No results for input(s): "AMMONIA" in the last 168 hours. Coagulation profile No results for input(s): "INR", "PROTIME" in the last 168 hours.  CBC: Recent Labs  Lab 11/28/22 0536 11/28/22 1606 11/29/22 1137 11/29/22 1602  11/29/22 1607 11/30/22 0512  WBC 8.9 9.3 6.0  --   --  5.8  HGB 10.7* 12.2* 11.6* 12.6* 12.9* 12.0*  HCT 33.1* 39.6 37.5* 37.0* 38.0* 37.9*  MCV 95.1 98.0 97.2  --   --  94.8  PLT 206 185 189  --   --  216   Cardiac Enzymes: No results for input(s): "CKTOTAL", "CKMB", "CKMBINDEX", "TROPONINI" in the last 168 hours. BNP (last 3 results) No results for input(s): "PROBNP" in the last 8760 hours. CBG: Recent Labs  Lab 11/30/22 1938 11/30/22 2338 12/01/22 0346 12/01/22 0750 12/01/22 1137  GLUCAP 167* 146* 161* 176* 250*   D-Dimer: No results for input(s): "DDIMER" in the last 72 hours. Hgb A1c: Recent Labs    11/29/22 0536  HGBA1C 9.0*   Lipid Profile: No results for input(s): "CHOL", "HDL", "LDLCALC", "TRIG", "CHOLHDL", "LDLDIRECT" in the last 72 hours. Thyroid function studies: No results for input(s): "TSH", "T4TOTAL", "T3FREE", "THYROIDAB" in the last 72 hours.  Invalid input(s): "FREET3" Anemia work up: No results for input(s): "VITAMINB12", "FOLATE", "FERRITIN", "TIBC", "IRON", "RETICCTPCT" in the last 72 hours. Sepsis Labs: Recent Labs  Lab 11/28/22 0536 11/28/22 1606 11/29/22 1137 11/30/22 0512  WBC 8.9 9.3 6.0 5.8    Microbiology Recent Results (from the past 240 hour(s))  MRSA Next Gen by PCR, Nasal     Status: None   Collection Time: 11/29/22  1:06 AM   Specimen: Nasal Mucosa; Nasal Swab  Result Value Ref Range Status   MRSA by PCR Next Gen NOT DETECTED NOT DETECTED Final    Comment: (NOTE) The GeneXpert MRSA Assay (FDA approved for NASAL specimens only), is one component of a comprehensive MRSA colonization surveillance program. It is not intended to diagnose MRSA infection nor to guide or monitor treatment for MRSA infections. Test performance is not FDA approved in patients less than 98 years old. Performed at [facility], [address] [place], [place] [address]     Procedures and diagnostic studies:  CARDIAC  CATHETERIZATION  Result Date: 11/29/2022   Hemodynamic findings consistent with severe pulmonary hypertension. 1.  Pulmonary hypertension with PA pressure 77/33 with pulmonary arterial mean 50 mmHg 2.  Elevated pulmonary capillary wedge 31 mmHg consistent with systolic congestive heart failure 3.  Cardiac output 4.02, cardiac index 2.01 Recommendations 1.  Continue diuresis 2.  Blood pressure support as needed               LOS: 3 days   Gianfranco Araki  [facility]   Pager on [email or web address]. If 7PM-7AM, please contact night-coverage at [email or web address]     12/01/2022, 2:07 PM

## 2022-12-02 DIAGNOSIS — I5023 Acute on chronic systolic (congestive) heart failure: Secondary | ICD-10-CM | POA: Diagnosis not present

## 2022-12-02 DIAGNOSIS — R570 Cardiogenic shock: Secondary | ICD-10-CM | POA: Diagnosis not present

## 2022-12-02 LAB — BASIC METABOLIC PANEL
Anion gap: 14 (ref 5–15)
BUN: 54 mg/dL — ABNORMAL HIGH (ref 8–23)
CO2: 29 mmol/L (ref 22–32)
Calcium: 8.9 mg/dL (ref 8.9–10.3)
Chloride: 89 mmol/L — ABNORMAL LOW (ref 98–111)
Creatinine, Ser: 1.55 mg/dL — ABNORMAL HIGH (ref 0.61–1.24)
GFR, Estimated: 50 mL/min — ABNORMAL LOW (ref >60)
Glucose, Bld: 86 mg/dL (ref 70–99)
Potassium: 4.1 mmol/L (ref 3.5–5.1)
Sodium: 132 mmol/L — ABNORMAL LOW (ref 135–145)

## 2022-12-02 LAB — MAGNESIUM: Magnesium: 2.3 mg/dL (ref 1.7–2.4)

## 2022-12-02 LAB — GLUCOSE, CAPILLARY
Glucose-Capillary: 82 mg/dL (ref 70–99)
Glucose-Capillary: 95 mg/dL (ref 70–99)
Glucose-Capillary: 203 mg/dL — ABNORMAL HIGH (ref 70–99)
Glucose-Capillary: 230 mg/dL — ABNORMAL HIGH (ref 70–99)
Glucose-Capillary: 168 mg/dL — ABNORMAL HIGH (ref 70–99)

## 2022-12-02 LAB — LIPOPROTEIN A (LPA): Lipoprotein (a): 135.6 nmol/L — ABNORMAL HIGH (ref <75.0)

## 2022-12-02 MED ORDER — TORSEMIDE 20 MG PO TABS
40.0000 mg | ORAL_TABLET | Freq: Two times a day (BID) | ORAL | Status: DC
  Administered 2022-12-02 – 2022-12-03 (×3): 40 mg via ORAL
  Filled 2022-12-02 (×3): qty 2

## 2022-12-02 NOTE — Plan of Care (Signed)

## 2022-12-02 NOTE — Progress Notes (Signed)
[facility] CLINIC CARDIOLOGY CONSULT NOTE       Patient ID: [name] MRN: [id number] DOB/AGE: September 10, 1960 62 y.o.  Admit date: 11/28/2022 Referring Physician Eual Fines, NP  Primary Physician Dr. Marcello Fennel Primary Cardiologist Dr. Darrold Junker Reason for Consultation AoCHF  HPI: [name] ia a [phone] with a PMH of HFrEF (20%), s/p primary prevention ICD 07/2022, CAD s/p CABG x 3 (LIMA-LAD, SVG-OM2, SVG-PDA 12/09/2013), ILD/chronic respiratory failure (2L), DM2 who presented initially to [facility] Cardiology clinic with 2 months of worsening dyspnea on exertion, ~13 pound weight gain in the past week and abdominal distention despite taking metolazone. He was referred directly to [facility] ED, where he was hypotensive to 70/30 and hypoxic, requiring levophed & 2L by [place]. Cardiology consulted for assistance with his CHF exacerbation.   Interval History:  -Continues to diurese well, weight down to 82.8 kg (182 pounds) today.  Dry weight around 175-180lbs -No chest pain, dyspnea improving.  Peripheral edema remains present primarily in his left lower leg, but abdominal distention has greatly improved.  Review of systems complete and found to be negative unless listed above     Past Medical History:  Diagnosis Date   Cancer (HCC)    skin (scalp)   CHF (congestive heart failure) (HCC)    COPD (chronic obstructive pulmonary disease) (HCC)    Coronary artery disease    Coronary artery disease    Diabetes mellitus without complication (HCC)    Dyspnea    Fibromyalgia    History of kidney stones    asymptomatic   IPF (idiopathic pulmonary fibrosis) (HCC)    MI (myocardial infarction) (HCC)    Peripheral vascular disease (HCC)    Pneumonia     Past Surgical History:  Procedure Laterality Date   APPLICATION OF WOUND VAC     chest wound s/p CABG   CARDIAC CATHETERIZATION     CARDIAC DEFIBRILLATOR PLACEMENT  08/15/2022   CORONARY ARTERY BYPASS GRAFT  2015   3 vessels   ENDARTERECTOMY  Left 09/08/2017   Procedure: ENDARTERECTOMY CAROTID;  Surgeon: Renford Dills, MD;  Location: [facility] ORS;  Service: Vascular;  Laterality: Left;   LOWER EXTREMITY ANGIOGRAPHY Right 01/29/2019   Procedure: LOWER EXTREMITY ANGIOGRAPHY;  Surgeon: Renford Dills, MD;  Location: [facility] INVASIVE CV LAB;  Service: Cardiovascular;  Laterality: Right;   PERIPHERAL VASCULAR CATHETERIZATION N/A 06/09/2015   Procedure: Abdominal Aortogram w/Lower Extremity;  Surgeon: Renford Dills, MD;  Location: [facility] INVASIVE CV LAB;  Service: Cardiovascular;  Laterality: N/A;   PERIPHERAL VASCULAR CATHETERIZATION  06/09/2015   Procedure: Lower Extremity Intervention;  Surgeon: Renford Dills, MD;  Location: [facility] INVASIVE CV LAB;  Service: Cardiovascular;;   RIGHT HEART CATH N/A 11/29/2022   Procedure: RIGHT HEART CATH;  Surgeon: Marcina Millard, MD;  Location: [facility] INVASIVE CV LAB;  Service: Cardiovascular;  Laterality: N/A;   stent in leg Left    WOUND DEBRIDEMENT     debridement of chest wound x 2 s/p CABG     Medications Prior to Admission  Medication Sig Dispense Refill Last Dose   albuterol (PROVENTIL HFA;VENTOLIN HFA) 108 (90 Base) MCG/ACT inhaler Inhale 2 puffs into the lungs every 6 (six) hours as needed for wheezing or shortness of breath.   unk at unk   aspirin EC 81 MG tablet Take 81 mg by mouth daily.   Past Week   Buprenorphine HCl (BELBUCA) 450 MCG FILM Place 1 Film (450 mcg total) inside cheek every 12 (twelve) hours. 60 each  2 11/27/2022   carvedilol (COREG) 3.125 MG tablet Take by mouth.   11/28/2022   furosemide (LASIX) 40 MG tablet Take 40 mg by mouth 2 (two) times daily.   11/28/2022   HYDROcodone-acetaminophen (NORCO) 10-325 MG tablet Take 1 tablet by mouth every 8 (eight) hours as needed for severe pain. Must last 30 days. 90 tablet 0 11/27/2022   metFORMIN (GLUCOPHAGE) 500 MG tablet Take 500 mg by mouth 2 (two) times daily with a meal.   Past Month   metolazone (ZAROXOLYN) 5 MG tablet Take 5  mg by mouth daily as needed.   11/28/2022   Pirfenidone (ESBRIET) 267 MG CAPS Take 801 mg by mouth 2 (two) times daily.    11/28/2022   pravastatin (PRAVACHOL) 40 MG tablet Take 40 mg by mouth every other day.    11/27/2022   sodium chloride (OCEAN) 0.65 % nasal spray Place 1 spray into the nose as needed (dryness).   unk at unk   tamsulosin (FLOMAX) 0.4 MG CAPS capsule Take 0.4 mg by mouth every evening.  3 Past Month   acetaminophen (TYLENOL) 500 MG tablet Take 1 tablet (500 mg total) by mouth every 8 (eight) hours as needed for moderate pain or headache. (Patient not taking: Reported on 11/28/2022) 90 tablet 2 Not Taking   HYDROcodone-acetaminophen (NORCO) 10-325 MG tablet Take 1 tablet by mouth every 8 (eight) hours as needed for severe pain. Must last 30 days. 90 tablet 0    HYDROcodone-acetaminophen (NORCO) 10-325 MG tablet Take 1 tablet by mouth every 8 (eight) hours as needed for severe pain. Must last 30 days. 90 tablet 0    spironolactone (ALDACTONE) 25 MG tablet Take 1 tablet by mouth as needed. (Patient not taking: Reported on 11/28/2022)   Not Taking   Social History   Socioeconomic History   Marital status: Married    Spouse name: jerri   Number of children: 2   Years of education: Not on file   Highest education level: Some college, no degree  Occupational History   Not on file  Tobacco Use   Smoking status: Former    Types: Cigarettes    Quit date: 10/20/2013    Years since quitting: 9.1   Smokeless tobacco: Former  Building services engineer Use: Never used  Substance and Sexual Activity   Alcohol use: Yes    Comment: seldom   Drug use: No   Sexual activity: Not on file  Other Topics Concern   Not on file  Social History Narrative   Not on file   Social Determinants of Health   Financial Resource Strain: Low Risk  (01/28/2019)   Overall Financial Resource Strain (CARDIA)    Difficulty of Paying Living Expenses: Not hard at all  Food Insecurity: No Food Insecurity (01/28/2019)    Hunger Vital Sign    Worried About Running Out of Food in the Last Year: Never true    Ran Out of Food in the Last Year: Never true  Transportation Needs: No Transportation Needs (01/28/2019)   PRAPARE - Administrator, Civil Service (Medical): No    Lack of Transportation (Non-Medical): No  Physical Activity: Sufficiently Active (01/28/2019)   Exercise Vital Sign    Days of Exercise per Week: 5 days    Minutes of Exercise per Session: 30 min  Stress: Stress Concern Present (01/28/2019)   [employer] of Occupational Health - Occupational Stress Questionnaire    Feeling of Stress : Very much  Social Connections: Unknown (01/28/2019)   Social Connection and Isolation Panel [NHANES]    Frequency of Communication with Friends and Family: Not on file    Frequency of Social Gatherings with Friends and Family: Not on file    Attends Religious Services: Never    Active Member of Clubs or Organizations: No    Attends Banker Meetings: Never    Marital Status: Married  Catering manager Violence: Not At Risk (01/28/2019)   Humiliation, Afraid, Rape, and Kick questionnaire    Fear of Current or Ex-Partner: No    Emotionally Abused: No    Physically Abused: No    Sexually Abused: No    Family History  Problem Relation Age of Onset   Heart attack Mother    Cancer Father    Mental illness Neg Hx       Intake/Output Summary (Last 24 hours) at 12/02/2022 1004 Last data filed at 12/02/2022 0908 Gross per 24 hour  Intake 1356.57 ml  Output 1595 ml  Net -238.43 ml     Vitals:   12/02/22 0800 12/02/22 0815 12/02/22 0830 12/02/22 0835  BP: 98/75  109/78 114/63  Pulse: 98 94 (!) 102 (!) 102  Resp: (!) 22 16 (!) 26 (!) 23  Temp: 98.7 F (37.1 C)     TempSrc: Oral     SpO2: 97% 98% 99% 100%  Weight:      Height:        PHYSICAL EXAM General: ill appearing caucasian male, appears older than chronological age in no acute distress.  Sitting upright in stepdown bed  with wife and daughter at bedside  HEENT:  Normocephalic and atraumatic. Neck:  No JVD.  Lungs: Normal respiratory effort on 2L . Rhonchi in bases with decreased breath sounds in upper lung fields no wheezing.   Heart: Regular rate and rhythm normal S1 and S2 without gallops or murmurs.  Abdomen: soft, nontender, mildly distended but improving Msk: Normal strength and tone for age. Extremities: Feet are cool to touch, chronic brawny hyperpigmentation with 1+ left LE edema and trace right lower extremity edema, compression stockings in place bilaterally Neuro: Alert and oriented X 3. Psych:  Answers questions appropriately.   Labs: Basic Metabolic Panel: Recent Labs    11/30/22 0512 12/01/22 0647 12/02/22 0548  NA 134* 130* 132*  K 3.9 3.6 4.1  CL 92* 89* 89*  CO2 30 24 29   GLUCOSE 131* 181* 86  BUN 54* 52* 54*  CREATININE 1.30* 1.35* 1.55*  CALCIUM 8.9 8.9 8.9  MG 2.7* 2.3 2.3  PHOS 3.6  --   --     Liver Function Tests: Recent Labs    11/30/22 0512  AST 33  ALT 125*  ALKPHOS 69  BILITOT 1.5*  PROT 7.6  ALBUMIN 3.5    No results for input(s): "LIPASE", "AMYLASE" in the last 72 hours. CBC: Recent Labs    11/29/22 1137 11/29/22 1602 11/29/22 1607 11/30/22 0512  WBC 6.0  --   --  5.8  HGB 11.6*   < > 12.9* 12.0*  HCT 37.5*   < > 38.0* 37.9*  MCV 97.2  --   --  94.8  PLT 189  --   --  216   < > = values in this interval not displayed.    Cardiac Enzymes: No results for input(s): "CKTOTAL", "CKMB", "CKMBINDEX", "TROPONINIHS" in the last 72 hours.  BNP: No results for input(s): "BNP" in the last 72 hours.  D-Dimer: No  results for input(s): "DDIMER" in the last 72 hours. Hemoglobin A1C: No results for input(s): "HGBA1C" in the last 72 hours.  Fasting Lipid Panel: No results for input(s): "CHOL", "HDL", "LDLCALC", "TRIG", "CHOLHDL", "LDLDIRECT" in the last 72 hours. Thyroid Function Tests: No results for input(s): "TSH", "T4TOTAL", "T3FREE",  "THYROIDAB" in the last 72 hours.  Invalid input(s): "FREET3" Anemia Panel: No results for input(s): "VITAMINB12", "FOLATE", "FERRITIN", "TIBC", "IRON", "RETICCTPCT" in the last 72 hours.   Radiology: CARDIAC CATHETERIZATION  Result Date: 11/29/2022   Hemodynamic findings consistent with severe pulmonary hypertension. 1.  Pulmonary hypertension with PA pressure 77/33 with pulmonary arterial mean 50 mmHg 2.  Elevated pulmonary capillary wedge 31 mmHg consistent with systolic congestive heart failure 3.  Cardiac output 4.02, cardiac index 2.01 Recommendations 1.  Continue diuresis 2.  Blood pressure support as needed   ECHOCARDIOGRAM COMPLETE  Result Date: 11/29/2022    ECHOCARDIOGRAM REPORT   Patient Name:   JAEN SCHILDT Date of Exam: 11/29/2022 Medical Rec #:  [id number]       Height:       67.0 in Accession #:    [id number]      Weight:       195.1 lb Date of Birth:  [birth year]       BSA:          2.001 m Patient Age:    61 years        BP:           96/67 mmHg Patient Gender: M               HR:           93 bpm. Exam Location:  [facility] Procedure: 2D Echo, Cardiac Doppler, Color Doppler and Intracardiac            Opacification Agent Indications:     Dyspnea  History:         Patient has prior history of Echocardiogram examinations, most                  recent 09/10/2017. CHF, CAD, Prior CABG and Defibrillator, PAD                  and COPD; Risk Factors:Diabetes and Dyslipidemia. Shock.  Sonographer:     Mikki Harbor Referring Phys:  [id number] KHABIB DGAYLI Diagnosing Phys: Marcina Millard MD  Sonographer Comments: Technically difficult study due to poor echo windows. Image acquisition challenging due to COPD. IMPRESSIONS  1. Left ventricular ejection fraction, by estimation, is <20%. The left ventricle has severely decreased function. The left ventricle has no regional wall motion abnormalities. The left ventricular internal cavity size was severely dilated. Left ventricular diastolic parameters  were normal.  2. Right ventricular systolic function is normal. The right ventricular size is normal. There is moderately elevated pulmonary artery systolic pressure.  3. The mitral valve is normal in structure. Moderate mitral valve regurgitation. No evidence of mitral stenosis.  4. Tricuspid valve regurgitation is mild to moderate.  5. The aortic valve is normal in structure. Aortic valve regurgitation is not visualized. No aortic stenosis is present.  6. The inferior vena cava is normal in size with greater than 50% respiratory variability, suggesting right atrial pressure of 3 mmHg. FINDINGS  Left Ventricle: Left ventricular ejection fraction, by estimation, is <20%. The left ventricle has severely decreased function. The left ventricle has no regional wall motion abnormalities. Definity contrast agent was given IV to delineate the left ventricular  endocardial borders. The left ventricular internal cavity size was severely dilated. There is no left ventricular hypertrophy. Left ventricular diastolic parameters were normal. Right Ventricle: The right ventricular size is normal. No increase in right ventricular wall thickness. Right ventricular systolic function is normal. There is moderately elevated pulmonary artery systolic pressure. The tricuspid regurgitant velocity is 2.86 m/s, and with an assumed right atrial pressure of 15 mmHg, the estimated right ventricular systolic pressure is 47.7 mmHg. Left Atrium: Left atrial size was normal in size. Right Atrium: Right atrial size was normal in size. Pericardium: There is no evidence of pericardial effusion. Mitral Valve: The mitral valve is normal in structure. Moderate mitral valve regurgitation. No evidence of mitral valve stenosis. MV peak gradient, 4.1 mmHg. The mean mitral valve gradient is 2.0 mmHg. Tricuspid Valve: The tricuspid valve is normal in structure. Tricuspid valve regurgitation is mild to moderate. No evidence of tricuspid stenosis. Aortic Valve:  The aortic valve is normal in structure. Aortic valve regurgitation is not visualized. No aortic stenosis is present. Aortic valve mean gradient measures 2.0 mmHg. Aortic valve peak gradient measures 3.3 mmHg. Aortic valve area, by VTI measures 2.04 cm. Pulmonic Valve: The pulmonic valve was normal in structure. Pulmonic valve regurgitation is not visualized. No evidence of pulmonic stenosis. Aorta: The aortic root is normal in size and structure. Venous: The inferior vena cava is normal in size with greater than 50% respiratory variability, suggesting right atrial pressure of 3 mmHg. IAS/Shunts: No atrial level shunt detected by color flow Doppler.  LEFT VENTRICLE PLAX 2D LVIDd:         6.10 cm   Diastology LVIDs:         5.90 cm   LV e' medial:    5.26 cm/s LV PW:         1.00 cm   LV E/e' medial:  15.7 LV IVS:        1.00 cm   LV e' lateral:   8.59 cm/s LVOT diam:     2.00 cm   LV E/e' lateral: 9.6 LV SV:         34 LV SV Index:   17 LVOT Area:     3.14 cm  RIGHT VENTRICLE RV Basal diam:  3.85 cm RV Mid diam:    4.10 cm RV S prime:     4.73 cm/s TAPSE (M-mode): 1.0 cm LEFT ATRIUM           Index        RIGHT ATRIUM           Index LA diam:      4.50 cm 2.25 cm/m   RA Area:     17.30 cm LA Vol (A2C): 74.2 ml 37.08 ml/m  RA Volume:   48.50 ml  24.24 ml/m LA Vol (A4C): 76.6 ml 38.28 ml/m  AORTIC VALVE                    PULMONIC VALVE AV Area (Vmax):    2.53 cm     PV Vmax:       0.80 m/s AV Area (Vmean):   2.31 cm     PV Peak grad:  2.5 mmHg AV Area (VTI):     2.04 cm AV Vmax:           90.40 cm/s AV Vmean:          62.300 cm/s AV VTI:  0.166 m AV Peak Grad:      3.3 mmHg AV Mean Grad:      2.0 mmHg LVOT Vmax:         72.70 cm/s LVOT Vmean:        45.900 cm/s LVOT VTI:          0.108 m LVOT/AV VTI ratio: 0.65  AORTA Ao Root diam: 2.70 cm MITRAL VALVE               TRICUSPID VALVE MV Area (PHT): 5.58 cm    TR Peak grad:   32.7 mmHg MV Area VTI:   1.75 cm    TR Vmax:        286.00 cm/s MV Peak  grad:  4.1 mmHg MV Mean grad:  2.0 mmHg    SHUNTS MV Vmax:       1.01 m/s    Systemic VTI:  0.11 m MV Vmean:      60.7 cm/s   Systemic Diam: 2.00 cm MV Decel Time: 136 msec MV E velocity: 82.80 cm/s MV A velocity: 43.50 cm/s MV E/A ratio:  1.90 Marcina Millard MD Electronically signed by Marcina Millard MD Signature Date/Time: 11/29/2022/1:03:23 PM    Final    US Abdomen Limited RUQ (LIVER/GB)  Result Date: 11/29/2022 CLINICAL DATA:  Transaminitis. EXAM: ULTRASOUND ABDOMEN LIMITED RIGHT UPPER QUADRANT COMPARISON:  None Available. FINDINGS: Gallbladder: Several gallstones evident measuring up to 6 mm. Gallbladder wall is mildly thickened at 3-4 mm. Sonographer reports no sonographic Murphy sign. Trace fluid is seen adjacent to the liver. Common bile duct: Diameter: 6 mm Liver: Echogenic parenchyma with nodular contour. No focal parenchymal mass lesion. No intrahepatic biliary duct dilatation. Portal vein is patent on color Doppler imaging with normal direction of blood flow towards the liver. Other: As above, there is trace perihepatic ascites. IMPRESSION: 1. Cholelithiasis with mild gallbladder wall thickening. Sonographer reports no sonographic Murphy sign. Gallbladder wall thickening can be nonspecific in the setting of liver disease. 2. Echogenic liver with nodular contour suggesting cirrhosis. 3. Trace perihepatic ascites. Electronically Signed   By: Kennith Center M.D.   On: 11/29/2022 09:41   DG Chest 2 View  Result Date: 11/28/2022 CLINICAL DATA:  Chest pain EXAM: CHEST - 2 VIEW COMPARISON:  X-ray 12/26/2020.  Report of a x-ray 11/08/2022 FINDINGS: Status post median sternotomy. Left upper chest defibrillator with leads along the right side of the enlarged heart. Increasing interstitial changes, possibly edema. Small pleural effusions. No pneumothorax. Overlapping cardiac leads. Osteopenia. IMPRESSION: Status post median sternotomy. Enlarged heart with vascular congestion and interstitial edema.  Tiny pleural effusions. Defibrillator.  Recommend follow-up Electronically Signed   By: Karen Kays M.D.   On: 11/28/2022 16:46    ECHO 05/20/2022 INTERPRETATION  SEVERE LV SYSTOLIC DYSFUNCTION (See above)  MILD RV SYSTOLIC DYSFUNCTION (See above)  MILD VALVULAR REGURGITATION (See above)  NO VALVULAR STENOSIS  Contraction: SEVERE GLOBAL DECREASE  Closest EF: 20% (Estimated)  Aortic: TRIVIAL AR  Mitral: MILD MR  Tricuspid: MILD TR  _________________________________________________________________________________________  Electronically signed by            MD Mariel Kansky on 05/20/2022 09: 54 AM           Performed By: Luretha Murphy, RDCS, RVT     Ordering Physician: Lady Gary, MD [place] reviewed by me (LT) 12/02/2022 : Sinus rhythm rate 70s to 80s  EKG reviewed by me: NSR rate 84 incomplete RBBB with chronic TW flattening laterally  Data reviewed by me (LT) 12/02/2022: Hospitalist note, nursing notes last 24h vitals tele labs imaging I/O    Principal Problem:   Cardiogenic shock (HCC) Active Problems:   PAD (peripheral artery disease) (HCC)   CAD (coronary artery disease)   DM (diabetes mellitus) (HCC)   Acute on chronic systolic CHF (congestive heart failure) (HCC)   Ventricular tachycardia (HCC)   Liver cirrhosis (HCC)    ASSESSMENT AND PLAN:  [name] ia a [phone] with a PMH of HFrEF (20%), s/p primary prevention ICD 07/2022, CAD s/p CABG x 3 (LIMA-LAD, SVG-OM2, SVG-PDA 12/09/2013), ILD/chronic respiratory failure (2L), DM2 who presented initially to [facility] Cardiology clinic with 2 months of worsening dyspnea on exertion, ~13 pound weight gain in the past week and abdominal distention despite taking metolazone. He was referred directly to [facility] ED, where he was hypotensive to 70/30 and hypoxic, requiring levophed & 2L by [place]. Cardiology consulted for assistance with his CHF exacerbation.   # cardiogenic shock  # acute on chronic HFrEF  # s/p primary prevention ICD  Echo  this admission shows EF < 20% with severely dilated LV, moderately elevated PASP, moderate MR, mi-mod TR. BNP elevated at 1200, CXR with pulmonary edema and pleural effusions. ~ 20-25lbs above dry weight on admission. Discussed case with Dr. Cheral [facility] HF) who recommended RHC to assess hemodynamics to help guide management. RHC with PA pressure 77/33, PA mean , PCWP c/w systolic CHF, CO 6.04, CI 2.01.  -Wean dopamine as tolerated to keep a MAP of 65 or greater -Discontinue lasix gtt  -S/p metolazone 5 mg x 1 5/10 -Start torsemide 40 mg twice a day -Monitor BMP daily - hold coreg with borderline low BP. ARB/ARNI, SGLT2i, MRA limited by BP historically. Will aim to add as BP and renal function allow.  - salt and fluid restriction, strict I/O  - echo resulted with EF <20%,  severe LV dilation, mod MR, mild-mod TR  # sustained VT ~39 sec run of VT overnight, asymptomatic.  -S/p 1 g load of IV amiodarone  -Continue amiodarone PO 400mg  BID x 10 days for a goal load ~8 grams, then 200 mg daily thereafter starting Sunday 5/19 - restart BB as BP tolerates, likely after IV diuresis    # acute on chronic respiratory failure  # hx ILD  Intermittent 2L at home, using more frequently as he's been more short of breath from the above.   # CAD s/p CABG  Chest pain free, EKG non ischemic. Troponin 19, 17. Continue aspirin 81mg  daily + home pravastatin 40mg  every other day.   # poorly controlled DM2 A1C 9%. Could benefit for SGLT2i, GLP-1 as above.   This patient's plan of care was discussed and created with Dr. Darrold Junker and he is in agreement.  Signed: Rebeca Allegra , PA-C 12/02/2022, 10:04 AM [facility]

## 2022-12-02 NOTE — Progress Notes (Addendum)
Progress Note    [name]  [id number] DOB: [birth year]  DOA: 11/28/2022 PCP: Barbette Reichmann, MD      Brief Narrative:    Medical records reviewed and are as summarized below:  [name] is a 62 y.o. male PMH of HFrEF (20%), s/p primary prevention ICD 07/2022, CAD s/p CABG x 3 (LIMA-LAD, SVG-OM2, SVG-PDA 12/09/2013), ILD/chronic respiratory failure (2L), DM2, who presented to the hospital with shortness of breath, chest tightness and decreased appetite.        Assessment/Plan:   Principal Problem:   Cardiogenic shock (HCC) Active Problems:   PAD (peripheral artery disease) (HCC)   CAD (coronary artery disease)   DM (diabetes mellitus) (HCC)   Acute on chronic systolic CHF (congestive heart failure) (HCC)   Ventricular tachycardia (HCC)   Liver cirrhosis (HCC)   Body mass index is 28.59 kg/m.   Cardiogenic shock: BP is better and dopamine drip has been tapered off.   Acute on chronic systolic CHF, s/p AICD: IV history of aspirin discontinued.  He has been started on torsemide.  Monitor BMP, daily weight and urine output.  2D echo showed EF estimated at 20%. S/p right heart cath on 12/09/2022.  Follow-up with cardiologist for further recommendations.   Ventricular tachycardia: He still has episodes of NSVT on telemetry.  Continue oral amiodarone.   CAD s/p CABG: Continue aspirin   Type II DM with hyperglycemia: NovoLog as needed for hyperglycemia.   Interstitial lung disease, chronic hypoxic respiratory failure: Continue 2 L/min oxygen via [place].  His wife said ILD was attributed to COVID-19 infection   CKD stage IIIa: Creatinine is stable.  Baseline creatinine is around 1.5.   Liver cirrhosis: Abdominal ultrasound showed liver cirrhosis, cholelithiasis. Outpatient follow-up with gastroenterologist recommended.   Chronic hyponatremia: Likely from liver disease and CHF   Recommended palliative care consult.  However, patient  declined.   Diet Order             Diet 2 gram sodium Room service appropriate? Yes; Fluid consistency: Thin; Fluid restriction: 1200 mL Fluid  Diet effective now                            Consultants: Cardiologist Intensivist  Procedures: S/p right heart cath    Medications:    amiodarone  400 mg Oral BID   Followed by   Melene Muller ON 12/11/2022] amiodarone  200 mg Oral Daily   aspirin EC  81 mg Oral Daily   Chlorhexidine Gluconate Cloth  6 each Topical Daily   enoxaparin (LOVENOX) injection  40 mg Subcutaneous Q24H   insulin aspart  0-15 Units Subcutaneous Q4H   multivitamin with minerals  1 tablet Oral Daily   oxyCODONE  10 mg Oral Q6H   pravastatin  40 mg Oral QODAY   Ensure Max Protein  11 oz Oral Daily   sodium chloride flush  3 mL Intravenous Q12H   sodium chloride flush  3 mL Intravenous Q12H   torsemide  40 mg Oral BID   Continuous Infusions:  sodium chloride     DOPamine 3.5 mcg/kg/min (12/02/22 1509)     Anti-infectives (From admission, onward)    None              Family Communication/Anticipated D/C date and plan/Code Status   DVT prophylaxis: Place TED hose Start: 12/01/22 1057 enoxaparin (LOVENOX) injection 40 mg Start: 11/29/22 0800 SCDs Start: 11/28/22 2259  Code Status: Full Code  Family Communication: Plan discussed with his wife at the bedside Disposition Plan: Plan to discharge home in 2 to 3 days   Status is: Inpatient Remains inpatient appropriate because: CHF exacerbation       Subjective:   Interval events noted.  He said he feels better.  No shortness of breath or chest pain.  He still has swelling in the legs.  His wife was at the bedside.  Audley Hose, nurses, were at the bedside.  Objective:    Vitals:   12/02/22 1430 12/02/22 1445 12/02/22 1500 12/02/22 1515  BP: (!) 66/22  (!) 80/59 (!) 84/65  Pulse: 77 76 77 83  Resp: 18 14 (!) 22 17  Temp:   97.9 F (36.6 C)   TempSrc:   Oral    SpO2: 100% 97% 100% 100%  Weight:      Height:       No data found.   Intake/Output Summary (Last 24 hours) at 12/02/2022 1559 Last data filed at 12/02/2022 1510 Gross per 24 hour  Intake 1161.36 ml  Output 1725 ml  Net -563.64 ml   Filed Weights   11/30/22 0800 12/01/22 0600 12/02/22 0500  Weight: 85.7 kg 83.4 kg 82.8 kg    Exam:  GEN: NAD SKIN: Warm and dry EYES: No pallor or icterus ENT: MMM CV: RRR PULM: Bibasilar rales, no wheezing ABD: soft, ND, NT, +BS CNS: AAO x 3, non focal EXT: Bilateral lower extremity edema   Data Reviewed:   I have personally reviewed following labs and imaging studies:  Labs: Labs show the following:   Basic Metabolic Panel: Recent Labs  Lab 11/28/22 0536 11/28/22 1606 11/28/22 1855 11/29/22 1137 11/29/22 1602 11/29/22 1607 11/30/22 0512 12/01/22 0647 12/02/22 0548  NA 132*  --  130* 132* 133* 134* 134* 130* 132*  K 3.8  --  4.5 4.0 3.8 3.9 3.9 3.6 4.1  CL 95*  --  93* 92*  --   --  92* 89* 89*  CO2 24  --  23 26  --   --  30 24 29   GLUCOSE 170*  --  256* 186*  --   --  131* 181* 86  BUN 56*   < > QUANTITY NOT SUFFICIENT, UNABLE TO PERFORM TEST 56*  --   --  54* 52* 54*  CREATININE 1.44*   < > QUANTITY NOT SUFFICIENT, UNABLE TO PERFORM TEST 1.51*  --   --  1.30* 1.35* 1.55*  CALCIUM 8.4*  --  8.3* 8.6*  --   --  8.9 8.9 8.9  MG 2.3  --   --  2.3  --   --  2.7* 2.3 2.3  PHOS 2.8  --   --   --   --   --  3.6  --   --    < > = values in this interval not displayed.   GFR Estimated Creatinine Clearance: 50.2 mL/min (A) (by C-G formula based on SCr of 1.55 mg/dL (H)). Liver Function Tests: Recent Labs  Lab 11/28/22 1855 11/30/22 0512  AST 42* 33  ALT 177* 125*  ALKPHOS 67 69  BILITOT 1.5* 1.5*  PROT 7.5 7.6  ALBUMIN 3.3* 3.5   No results for input(s): "LIPASE", "AMYLASE" in the last 168 hours. No results for input(s): "AMMONIA" in the last 168 hours. Coagulation profile No results for input(s): "INR", "PROTIME"  in the last 168 hours.  CBC: Recent Labs  Lab 11/28/22  1610 11/28/22 1606 11/29/22 1137 11/29/22 1602 11/29/22 1607 11/30/22 0512  WBC 8.9 9.3 6.0  --   --  5.8  HGB 10.7* 12.2* 11.6* 12.6* 12.9* 12.0*  HCT 33.1* 39.6 37.5* 37.0* 38.0* 37.9*  MCV 95.1 98.0 97.2  --   --  94.8  PLT 206 185 189  --   --  216   Cardiac Enzymes: No results for input(s): "CKTOTAL", "CKMB", "CKMBINDEX", "TROPONINI" in the last 168 hours. BNP (last 3 results) No results for input(s): "PROBNP" in the last 8760 hours. CBG: Recent Labs  Lab 12/01/22 1934 12/01/22 2331 12/02/22 0412 12/02/22 0724 12/02/22 1139  GLUCAP 168* 101* 82 95 203*   D-Dimer: No results for input(s): "DDIMER" in the last 72 hours. Hgb A1c: No results for input(s): "HGBA1C" in the last 72 hours.  Lipid Profile: No results for input(s): "CHOL", "HDL", "LDLCALC", "TRIG", "CHOLHDL", "LDLDIRECT" in the last 72 hours. Thyroid function studies: No results for input(s): "TSH", "T4TOTAL", "T3FREE", "THYROIDAB" in the last 72 hours.  Invalid input(s): "FREET3" Anemia work up: No results for input(s): "VITAMINB12", "FOLATE", "FERRITIN", "TIBC", "IRON", "RETICCTPCT" in the last 72 hours. Sepsis Labs: Recent Labs  Lab 11/28/22 0536 11/28/22 1606 11/29/22 1137 11/30/22 0512  WBC 8.9 9.3 6.0 5.8    Microbiology Recent Results (from the past 240 hour(s))  MRSA Next Gen by PCR, Nasal     Status: None   Collection Time: 11/29/22  1:06 AM   Specimen: Nasal Mucosa; Nasal Swab  Result Value Ref Range Status   MRSA by PCR Next Gen NOT DETECTED NOT DETECTED Final    Comment: (NOTE) The GeneXpert MRSA Assay (FDA approved for NASAL specimens only), is one component of a comprehensive MRSA colonization surveillance program. It is not intended to diagnose MRSA infection nor to guide or monitor treatment for MRSA infections. Test performance is not FDA approved in patients less than 73 years old. Performed at [facility],  [address] [place], [place] [address]     Procedures and diagnostic studies:  No results found.             LOS: 4 days   Katerina Zurn  [facility]   Pager on [email or web address]. If 7PM-7AM, please contact night-coverage at [email or web address]     12/02/2022, 3:59 PM

## 2022-12-02 NOTE — Inpatient Diabetes Management (Signed)
Inpatient Diabetes Program Recommendations  AACE/ADA: New Consensus Statement on Inpatient Glycemic Control (2015)  Target Ranges:  Prepandial:   less than 140 mg/dL      Peak postprandial:   less than 180 mg/dL (1-2 hours)      Critically ill patients:  140 - 180 mg/dL    Latest Reference Range & Units 11/30/22 23:38 12/01/22 03:46 12/01/22 07:50 12/01/22 11:37 12/01/22 15:30 12/01/22 19:34  Glucose-Capillary 70 - 99 mg/dL 161 (H) 096 (H)  3 units Novolog  176 (H)  3 units Novolog  250 (H)  5 units Novolog  265 (H)  8 units Novolog  168 (H)  3 units Novolog   (H): Data is abnormally high  Latest Reference Range & Units 12/01/22 23:31 12/02/22 04:12 12/02/22 07:24  Glucose-Capillary 70 - 99 mg/dL 045 (H) 82 95  (H): Data is abnormally high    Diabetes history: DM 2  Outpatient DM Meds:  Metformin 500 mg bid  Current orders: Novolog 0-15 units Q4 hours    MD- Please consider:  1. Change Novolog SSI to TID AC + HS  2. Start Novolog Meal Coverage: Novolog 4 units TID with meals HOLD if pt NPO HOLD if pt eats <50% meals  3. Note elevated A1C- Would pt benefit from adjustment of Metformin and possibly addition of SGLT-2 for home?    --Will follow patient during hospitalization--  Ambrose Finland RN, MSN, CDCES Diabetes Coordinator Inpatient Glycemic Control Team Team Pager: [phone] (8a-5p)

## 2022-12-03 ENCOUNTER — Encounter: Payer: Self-pay | Admitting: Student in an Organized Health Care Education/Training Program

## 2022-12-03 DIAGNOSIS — R570 Cardiogenic shock: Secondary | ICD-10-CM | POA: Diagnosis not present

## 2022-12-03 DIAGNOSIS — N179 Acute kidney failure, unspecified: Secondary | ICD-10-CM | POA: Diagnosis not present

## 2022-12-03 DIAGNOSIS — I5023 Acute on chronic systolic (congestive) heart failure: Secondary | ICD-10-CM | POA: Diagnosis not present

## 2022-12-03 LAB — BASIC METABOLIC PANEL
Anion gap: 12 (ref 5–15)
Anion gap: 13 (ref 5–15)
BUN: 65 mg/dL — ABNORMAL HIGH (ref 8–23)
BUN: 70 mg/dL — ABNORMAL HIGH (ref 8–23)
CO2: 30 mmol/L (ref 22–32)
CO2: 28 mmol/L (ref 22–32)
Calcium: 8.9 mg/dL (ref 8.9–10.3)
Calcium: 8.8 mg/dL — ABNORMAL LOW (ref 8.9–10.3)
Chloride: 88 mmol/L — ABNORMAL LOW (ref 98–111)
Chloride: 87 mmol/L — ABNORMAL LOW (ref 98–111)
Creatinine, Ser: 1.95 mg/dL — ABNORMAL HIGH (ref 0.61–1.24)
Creatinine, Ser: 2.26 mg/dL — ABNORMAL HIGH (ref 0.61–1.24)
GFR, Estimated: 38 mL/min — ABNORMAL LOW (ref >60)
GFR, Estimated: 32 mL/min — ABNORMAL LOW (ref >60)
Glucose, Bld: 113 mg/dL — ABNORMAL HIGH (ref 70–99)
Glucose, Bld: 148 mg/dL — ABNORMAL HIGH (ref 70–99)
Potassium: 4.2 mmol/L (ref 3.5–5.1)
Potassium: 4.6 mmol/L (ref 3.5–5.1)
Sodium: 130 mmol/L — ABNORMAL LOW (ref 135–145)
Sodium: 128 mmol/L — ABNORMAL LOW (ref 135–145)

## 2022-12-03 LAB — GLUCOSE, CAPILLARY
Glucose-Capillary: 112 mg/dL — ABNORMAL HIGH (ref 70–99)
Glucose-Capillary: 112 mg/dL — ABNORMAL HIGH (ref 70–99)
Glucose-Capillary: 131 mg/dL — ABNORMAL HIGH (ref 70–99)
Glucose-Capillary: 168 mg/dL — ABNORMAL HIGH (ref 70–99)
Glucose-Capillary: 191 mg/dL — ABNORMAL HIGH (ref 70–99)
Glucose-Capillary: 93 mg/dL (ref 70–99)

## 2022-12-03 LAB — MAGNESIUM: Magnesium: 2.3 mg/dL (ref 1.7–2.4)

## 2022-12-03 MED ORDER — BISACODYL 10 MG RE SUPP
10.0000 mg | Freq: Every day | RECTAL | Status: DC | PRN
  Administered 2022-12-03: 10 mg via RECTAL
  Filled 2022-12-03: qty 1

## 2022-12-03 MED ORDER — METOLAZONE 5 MG PO TABS: 5.0000 mg | ORAL_TABLET | Freq: Every day | ORAL | Status: DC

## 2022-12-03 MED ORDER — SODIUM CHLORIDE 0.9 % IV SOLN: INTRAVENOUS | Status: DC

## 2022-12-03 MED ORDER — LACTATED RINGERS IV SOLN: INTRAVENOUS | Status: AC

## 2022-12-03 MED ORDER — METOLAZONE 5 MG PO TABS
5.0000 mg | ORAL_TABLET | Freq: Every day | ORAL | Status: DC
  Filled 2022-12-03: qty 1

## 2022-12-03 MED ORDER — INSULIN ASPART 100 UNIT/ML IJ SOLN
0.0000 [IU] | Freq: Three times a day (TID) | INTRAMUSCULAR | Status: DC
  Administered 2022-12-04: 5 [IU] via SUBCUTANEOUS
  Administered 2022-12-05 – 2022-12-06 (×3): 3 [IU] via SUBCUTANEOUS
  Administered 2022-12-06: 2 [IU] via SUBCUTANEOUS
  Administered 2022-12-06 – 2022-12-07 (×3): 5 [IU] via SUBCUTANEOUS
  Administered 2022-12-07: 2 [IU] via SUBCUTANEOUS
  Filled 2022-12-03 (×9): qty 1

## 2022-12-03 NOTE — Progress Notes (Addendum)
Progress Note    [name]  WUJ:[id number] DOB: [birth year]  DOA: 11/28/2022 PCP: Barbette Reichmann, MD      Brief Narrative:    Medical records reviewed and are as summarized below:  [name] is a 62 y.o. male PMH of HFrEF (20%), s/p primary prevention ICD 07/2022, CAD s/p CABG x 3 (LIMA-LAD, SVG-OM2, SVG-PDA 12/09/2013), ILD/chronic respiratory failure (2L), DM2, who presented to the hospital with shortness of breath, chest tightness and decreased appetite.        Assessment/Plan:   Principal Problem:   Cardiogenic shock (HCC) Active Problems:   PAD (peripheral artery disease) (HCC)   CAD (coronary artery disease)   DM (diabetes mellitus) (HCC)   Acute on chronic systolic CHF (congestive heart failure) (HCC)   Ventricular tachycardia (HCC)   Liver cirrhosis (HCC)   AKI (acute kidney injury) (HCC)   Body mass index is 28.69 kg/m.   Cardiogenic shock: BP is better.  He is no longer on dopamine drip.  Monitor BP closely.   Acute on chronic systolic CHF, s/p AICD: He has been started on torsemide and metolazone by the cardiologist.  Monitor BMP, daily weight and urine output.  2D echo showed EF estimated at 20%. S/p right heart cath on 12/09/2022.  Follow-up with cardiologist for further recommendations.   Ventricular tachycardia: Continue amiodarone.  CAD s/p CABG: Continue aspirin   Type II DM with hyperglycemia: NovoLog as needed for hyperglycemia.   Interstitial lung disease, chronic hypoxic respiratory failure: Continue 2 L/min oxygen via [place] as needed.  His wife said ILD was attributed to COVID-19 infection   AKI on CKD stage IIIa: Monitor creatinine closely while on diuretics.  Baseline creatinine is around 1.5.   Liver cirrhosis: Abdominal ultrasound showed liver cirrhosis, cholelithiasis. Outpatient follow-up with gastroenterologist recommended.   Chronic hyponatremia: Likely from liver disease and CHF   Recommended palliative  care consult.  However, patient declined.   Diet Order             Diet 2 gram sodium Room service appropriate? Yes; Fluid consistency: Thin; Fluid restriction: 1200 mL Fluid  Diet effective now                            Consultants: Cardiologist Intensivist  Procedures: S/p right heart cath    Medications:    amiodarone  400 mg Oral BID   Followed by   Melene Muller ON 12/11/2022] amiodarone  200 mg Oral Daily   aspirin EC  81 mg Oral Daily   Chlorhexidine Gluconate Cloth  6 each Topical Daily   enoxaparin (LOVENOX) injection  40 mg Subcutaneous Q24H   insulin aspart  0-15 Units Subcutaneous Q4H   [START ON 12/04/2022] metolazone  5 mg Oral Daily   multivitamin with minerals  1 tablet Oral Daily   oxyCODONE  10 mg Oral Q6H   pravastatin  40 mg Oral QODAY   Ensure Max Protein  11 oz Oral Daily   sodium chloride flush  3 mL Intravenous Q12H   sodium chloride flush  3 mL Intravenous Q12H   torsemide  40 mg Oral BID   Continuous Infusions:  sodium chloride     DOPamine Stopped (12/03/22 0838)     Anti-infectives (From admission, onward)    None              Family Communication/Anticipated D/C date and plan/Code Status   DVT prophylaxis: Place TED  hose Start: 12/01/22 1057 enoxaparin (LOVENOX) injection 40 mg Start: 11/29/22 0800 SCDs Start: 11/28/22 2259     Code Status: Full Code  Family Communication: Plan discussed with his wife and daughter at the bedside Disposition Plan: Plan to discharge home in 2 to 3 days   Status is: Inpatient Remains inpatient appropriate because: CHF exacerbation       Subjective:   Interval events noted.  He said he did not sleep well last night but otherwise he feels okay.  He was able to ambulate in the hallway, on room air, without any shortness of breath or oxygen desaturation.  His wife and daughter were at the bedside.  Objective:    Vitals:   12/03/22 0830 12/03/22 0900 12/03/22 0930  12/03/22 1030  BP: (!) 91/59 (!) 98/59 (!) 108/51 99/62  Pulse: 79 (!) 36 86 84  Resp: 16 (!) 31 18 12   Temp:      TempSrc:      SpO2: 97% 98% 95% 93%  Weight:      Height:       No data found.   Intake/Output Summary (Last 24 hours) at 12/03/2022 1105 Last data filed at 12/03/2022 1054 Gross per 24 hour  Intake 364.37 ml  Output 350 ml  Net 14.37 ml   Filed Weights   12/01/22 0600 12/02/22 0500 12/03/22 0500  Weight: 83.4 kg 82.8 kg 83.1 kg    Exam:  GEN: NAD SKIN: Warm and dry EYES: No pallor or icterus ENT: MMM CV: RRR PULM: Bibasilar rales ABD: soft, ND, NT, +BS CNS: AAO x 3, non focal EXT: Bilateral lower extremity edema.  No tenderness    Data Reviewed:   I have personally reviewed following labs and imaging studies:  Labs: Labs show the following:   Basic Metabolic Panel: Recent Labs  Lab 11/28/22 0536 11/28/22 1606 11/29/22 1137 11/29/22 1602 11/29/22 1607 11/30/22 0512 12/01/22 0647 12/02/22 0548 12/03/22 0524  NA 132*   < > 132*   < > 134* 134* 130* 132* 130*  K 3.8   < > 4.0   < > 3.9 3.9 3.6 4.1 4.2  CL 95*   < > 92*  --   --  92* 89* 89* 88*  CO2 24   < > 26  --   --  30 24 29 30   GLUCOSE 170*   < > 186*  --   --  131* 181* 86 113*  BUN 56*   < > 56*  --   --  54* 52* 54* 65*  CREATININE 1.44*   < > 1.51*  --   --  1.30* 1.35* 1.55* 1.95*  CALCIUM 8.4*   < > 8.6*  --   --  8.9 8.9 8.9 8.9  MG 2.3  --  2.3  --   --  2.7* 2.3 2.3 2.3  PHOS 2.8  --   --   --   --  3.6  --   --   --    < > = values in this interval not displayed.   GFR Estimated Creatinine Clearance: 40 mL/min (A) (by C-G formula based on SCr of 1.95 mg/dL (H)). Liver Function Tests: Recent Labs  Lab 11/28/22 1855 11/30/22 0512  AST 42* 33  ALT 177* 125*  ALKPHOS 67 69  BILITOT 1.5* 1.5*  PROT 7.5 7.6  ALBUMIN 3.3* 3.5   No results for input(s): "LIPASE", "AMYLASE" in the last 168 hours. No results for input(s): "AMMONIA"  in the last 168 hours. Coagulation  profile No results for input(s): "INR", "PROTIME" in the last 168 hours.  CBC: Recent Labs  Lab 11/28/22 0536 11/28/22 1606 11/29/22 1137 11/29/22 1602 11/29/22 1607 11/30/22 0512  WBC 8.9 9.3 6.0  --   --  5.8  HGB 10.7* 12.2* 11.6* 12.6* 12.9* 12.0*  HCT 33.1* 39.6 37.5* 37.0* 38.0* 37.9*  MCV 95.1 98.0 97.2  --   --  94.8  PLT 206 185 189  --   --  216   Cardiac Enzymes: No results for input(s): "CKTOTAL", "CKMB", "CKMBINDEX", "TROPONINI" in the last 168 hours. BNP (last 3 results) No results for input(s): "PROBNP" in the last 8760 hours. CBG: Recent Labs  Lab 12/02/22 1604 12/02/22 1947 12/03/22 0059 12/03/22 0322 12/03/22 0741  GLUCAP 230* 168* 112* 112* 131*   D-Dimer: No results for input(s): "DDIMER" in the last 72 hours. Hgb A1c: No results for input(s): "HGBA1C" in the last 72 hours.  Lipid Profile: No results for input(s): "CHOL", "HDL", "LDLCALC", "TRIG", "CHOLHDL", "LDLDIRECT" in the last 72 hours. Thyroid function studies: No results for input(s): "TSH", "T4TOTAL", "T3FREE", "THYROIDAB" in the last 72 hours.  Invalid input(s): "FREET3" Anemia work up: No results for input(s): "VITAMINB12", "FOLATE", "FERRITIN", "TIBC", "IRON", "RETICCTPCT" in the last 72 hours. Sepsis Labs: Recent Labs  Lab 11/28/22 0536 11/28/22 1606 11/29/22 1137 11/30/22 0512  WBC 8.9 9.3 6.0 5.8    Microbiology Recent Results (from the past 240 hour(s))  MRSA Next Gen by PCR, Nasal     Status: None   Collection Time: 11/29/22  1:06 AM   Specimen: Nasal Mucosa; Nasal Swab  Result Value Ref Range Status   MRSA by PCR Next Gen NOT DETECTED NOT DETECTED Final    Comment: (NOTE) The GeneXpert MRSA Assay (FDA approved for NASAL specimens only), is one component of a comprehensive MRSA colonization surveillance program. It is not intended to diagnose MRSA infection nor to guide or monitor treatment for MRSA infections. Test performance is not FDA approved in patients less  than 4 years old. Performed at [facility], [address] [place], [place] [address]     Procedures and diagnostic studies:  No results found.             LOS: 5 days   Nahima Ales  [facility]   Pager on [email or web address]. If 7PM-7AM, please contact night-coverage at [email or web address]     12/03/2022, 11:05 AM

## 2022-12-03 NOTE — Progress Notes (Signed)
[facility] Cardiology  SUBJECTIVE: Patient sitting up in side of the bed, denies chest pain or shortness of breath   Vitals:   12/03/22 0630 12/03/22 0645 12/03/22 0700 12/03/22 0742  BP: 102/73 90/68 94/68    Pulse: 81 86 86 86  Resp: 15 20 17 15   Temp:    (!) 97.5 F (36.4 C)  TempSrc:    Oral  SpO2: 100% 92% 99% 100%  Weight:      Height:         Intake/Output Summary (Last 24 hours) at 12/03/2022 0845 Last data filed at 12/03/2022 0743 Gross per 24 hour  Intake 777.66 ml  Output 675 ml  Net 102.66 ml      PHYSICAL EXAM  General: Well developed, well nourished, in no acute distress HEENT:  Normocephalic and atramatic Neck:  No JVD.  Lungs: Clear bilaterally to auscultation and percussion. Heart: HRRR . Normal S1 and S2 without gallops or murmurs.  Abdomen: Bowel sounds are positive, abdomen soft and non-tender  Msk:  Back normal, normal gait. Normal strength and tone for age. Extremities: No clubbing, cyanosis or edema.   Neuro: Alert and oriented X 3. Psych:  Good affect, responds appropriately   LABS: Basic Metabolic Panel: Recent Labs    12/02/22 0548 12/03/22 0524  NA 132* 130*  K 4.1 4.2  CL 89* 88*  CO2 29 30  GLUCOSE 86 113*  BUN 54* 65*  CREATININE 1.55* 1.95*  CALCIUM 8.9 8.9  MG 2.3 2.3   Liver Function Tests: No results for input(s): "AST", "ALT", "ALKPHOS", "BILITOT", "PROT", "ALBUMIN" in the last 72 hours. No results for input(s): "LIPASE", "AMYLASE" in the last 72 hours. CBC: No results for input(s): "WBC", "NEUTROABS", "HGB", "HCT", "MCV", "PLT" in the last 72 hours. Cardiac Enzymes: No results for input(s): "CKTOTAL", "CKMB", "CKMBINDEX", "TROPONINI" in the last 72 hours. BNP: Invalid input(s): "POCBNP" D-Dimer: No results for input(s): "DDIMER" in the last 72 hours. Hemoglobin A1C: No results for input(s): "HGBA1C" in the last 72 hours. Fasting Lipid Panel: No results for input(s): "CHOL", "HDL", "LDLCALC", "TRIG", "CHOLHDL",  "LDLDIRECT" in the last 72 hours. Thyroid Function Tests: No results for input(s): "TSH", "T4TOTAL", "T3FREE", "THYROIDAB" in the last 72 hours.  Invalid input(s): "FREET3" Anemia Panel: No results for input(s): "VITAMINB12", "FOLATE", "FERRITIN", "TIBC", "IRON", "RETICCTPCT" in the last 72 hours.  No results found.   Echo EF less than 20%, moderate mitral regurgitation 11/29/2022  TELEMETRY: Sinus rhythm with PVCs:  ASSESSMENT AND PLAN:  Principal Problem:   Cardiogenic shock (HCC) Active Problems:   PAD (peripheral artery disease) (HCC)   CAD (coronary artery disease)   DM (diabetes mellitus) (HCC)   Acute on chronic systolic CHF (congestive heart failure) (HCC)   Ventricular tachycardia (HCC)   Liver cirrhosis (HCC)    1.  Acute on chronic HFrEF, EF less than 20% 11/29/2022, right heart cath revealed PCWP 31 mmHg, a 77/33, mean 50 mmHg, cardiac output 4.02, cardiac index 2.01, on dopamine to maintain MAP 65 better, furosemide drip transition to torsemide 40 mg twice daily with overall good diuresis, good medical management limited by low blood pressure 2.  CAD, CABG x 3 (LIMA-LAD, SVG-OM2 and PDA 12/09/2013), no chest pain 3.  Sustained VT, 39 seconds, received 1 g IV load amiodarone, addition to 400 mg p.o. twice daily for 10 days, then 200 mg daily started 12/11/2022 4.  CKD stage IIIb, BUN and creatinine 65 and 1.95, respectively  Recommendations  1.  Agree with current therapy  2.  Wean dopamine off 3.  Continue torsemide 40 mg twice daily 4.  Add metolazone 5 mg daily 5.  Continue amiodarone 400 mg twice daily, transition to 200 mg daily 12/11/2022 6.  Carvedilol 3.125 mg twice daily when blood pressure permits 7.  Will require advanced heart failure care, possible LVAD in the near future   Marcina Millard, MD, PhD, [facility] 12/03/2022 8:45 AM

## 2022-12-03 NOTE — Evaluation (Signed)
Physical Therapy Evaluation Patient Details Name: [name] MRN: [id number] DOB: [birth year] Today's Date: 12/03/2022  History of Present Illness  Pt admitted for cardiogenic shock. HIstory includes HF with EF of 20%, ICD, CAD s/p CABG x 3, ILD, DM, MI, and fibromyalgia. 2L available at home, however does wears PRN.  Clinical Impression  Pt is a pleasant 62 year old male who was admitted for cardiogenic shock. Pt demonstrates all bed mobility/transfers/ambulation at baseline level. All mobility performed on RA with sats at 95-97%. Pt does not require any further PT needs at this time. Pt will be dc in house and does not require follow up. RN aware. Will dc current orders.       Recommendations for follow up therapy are one component of a multi-disciplinary discharge planning process, led by the attending physician.  Recommendations may be updated based on patient status, additional functional criteria and insurance authorization.  Follow Up Recommendations       Assistance Recommended at Discharge PRN  Patient can return home with the following       Equipment Recommendations None recommended by PT  Recommendations for Other Services       Functional Status Assessment Patient has not had a recent decline in their functional status     Precautions / Restrictions Precautions Precautions: Fall Restrictions Weight Bearing Restrictions: No      Mobility  Bed Mobility               General bed mobility comments: NT, recevied at EOB upon arrival    Transfers Overall transfer level: Independent Equipment used: None               General transfer comment: safe technique with upright posture    Ambulation/Gait Ambulation/Gait assistance: Supervision Gait Distance (Feet): 100 Feet Assistive device: None Gait Pattern/deviations: Step-through pattern       General Gait Details: slightly unsteady, however improves with increased distance. No AD used. Vitals  monitored WNL on RA. Pt able to carry conversation during exertion  Stairs            Wheelchair Mobility    Modified Rankin (Stroke Patients Only)       Balance Overall balance assessment: No apparent balance deficits (not formally assessed)                                           Pertinent Vitals/Pain Pain Assessment Pain Assessment: No/denies pain    Home Living Family/patient expects to be discharged to:: Private residence Living Arrangements: Spouse/significant other Available Help at Discharge: Family Type of Home: House Home Access: Stairs to enter Entrance Stairs-Rails: None Entrance Stairs-Number of Steps: 3   Home Layout: One level Home Equipment: None      Prior Function Prior Level of Function : Independent/Modified Independent;Working/employed             Mobility Comments: very indep, working 3 jobs ADLs Comments: indep     Higher education careers adviser        Extremity/Trunk Assessment   Upper Extremity Assessment Upper Extremity Assessment: Overall WFL for tasks assessed    Lower Extremity Assessment Lower Extremity Assessment: Overall WFL for tasks assessed       Communication   Communication: No difficulties  Cognition Arousal/Alertness: Awake/alert Behavior During Therapy: WFL for tasks assessed/performed Overall Cognitive Status: Within Functional Limits for tasks assessed  General Comments: pleasant and agreeable to session        General Comments      Exercises     Assessment/Plan    PT Assessment Patient does not need any further PT services  PT Problem List         PT Treatment Interventions      PT Goals (Current goals can be found in the Care Plan section)  Acute Rehab PT Goals Patient Stated Goal: to go home PT Goal Formulation: All assessment and education complete, DC therapy Time For Goal Achievement: 12/03/22 Potential to Achieve Goals:  Good    Frequency       Co-evaluation               AM-PAC PT "6 Clicks" Mobility  Outcome Measure Help needed turning from your back to your side while in a flat bed without using bedrails?: None Help needed moving from lying on your back to sitting on the side of a flat bed without using bedrails?: None Help needed moving to and from a bed to a chair (including a wheelchair)?: None Help needed standing up from a chair using your arms (e.g., wheelchair or bedside chair)?: None Help needed to walk in hospital room?: None Help needed climbing 3-5 steps with a railing? : None 6 Click Score: 24    End of Session Equipment Utilized During Treatment: Gait belt Activity Tolerance: Patient tolerated treatment well Patient left: in bed Nurse Communication: Mobility status PT Visit Diagnosis: Muscle weakness (generalized) (M62.81);Difficulty in walking, not elsewhere classified (R26.2)    Time: 3474-2595 PT Time Calculation (min) (ACUTE ONLY): 16 min   Charges:   PT Evaluation $PT Eval Low Complexity: 1 Low PT Treatments $Gait Training: 8-22 mins        Elizabeth Palau, PT, DPT, GCS [phone]   [name] 12/03/2022, 10:17 AM

## 2022-12-03 NOTE — Progress Notes (Signed)
Patient just reported that he " felt very dizzy standing up and it has happened twice now'. BP 79/59 ( 67) Hr 84. Resp 16, oxygen sat 94/RA. Communication send to MD for above and low urine output. Stat BMP ordered and lab in room now collecting it.

## 2022-12-04 DIAGNOSIS — R570 Cardiogenic shock: Secondary | ICD-10-CM | POA: Diagnosis not present

## 2022-12-04 DIAGNOSIS — N179 Acute kidney failure, unspecified: Secondary | ICD-10-CM | POA: Diagnosis not present

## 2022-12-04 DIAGNOSIS — I5023 Acute on chronic systolic (congestive) heart failure: Secondary | ICD-10-CM | POA: Diagnosis not present

## 2022-12-04 LAB — GLUCOSE, CAPILLARY
Glucose-Capillary: 90 mg/dL (ref 70–99)
Glucose-Capillary: 203 mg/dL — ABNORMAL HIGH (ref 70–99)
Glucose-Capillary: 104 mg/dL — ABNORMAL HIGH (ref 70–99)
Glucose-Capillary: 116 mg/dL — ABNORMAL HIGH (ref 70–99)

## 2022-12-04 LAB — BASIC METABOLIC PANEL
Anion gap: 17 — ABNORMAL HIGH (ref 5–15)
BUN: 70 mg/dL — ABNORMAL HIGH (ref 8–23)
CO2: 26 mmol/L (ref 22–32)
Calcium: 8.9 mg/dL (ref 8.9–10.3)
Chloride: 85 mmol/L — ABNORMAL LOW (ref 98–111)
Creatinine, Ser: 2.3 mg/dL — ABNORMAL HIGH (ref 0.61–1.24)
GFR, Estimated: 31 mL/min — ABNORMAL LOW (ref >60)
Glucose, Bld: 104 mg/dL — ABNORMAL HIGH (ref 70–99)
Potassium: 4.9 mmol/L (ref 3.5–5.1)
Sodium: 128 mmol/L — ABNORMAL LOW (ref 135–145)

## 2022-12-04 LAB — MAGNESIUM: Magnesium: 2.3 mg/dL (ref 1.7–2.4)

## 2022-12-04 MED ORDER — TORSEMIDE 20 MG PO TABS
40.0000 mg | ORAL_TABLET | Freq: Every day | ORAL | Status: DC
  Administered 2022-12-04: 40 mg via ORAL
  Filled 2022-12-04: qty 2

## 2022-12-04 NOTE — Progress Notes (Signed)
Progress Note    [name]  ZOX:[id number] DOB: Dec 30, 1960  DOA: 11/28/2022 PCP: Barbette Reichmann, MD      Brief Narrative:    Medical records reviewed and are as summarized below:  [name] is a 62 y.o. male PMH of HFrEF (20%), s/p primary prevention ICD 07/2022, CAD s/p CABG x 3 (LIMA-LAD, SVG-OM2, SVG-PDA 12/09/2013), ILD/chronic respiratory failure (2L), DM2, who presented to the hospital with shortness of breath, chest tightness and decreased appetite.        Assessment/Plan:   Principal Problem:   Cardiogenic shock (HCC) Active Problems:   PAD (peripheral artery disease) (HCC)   CAD (coronary artery disease)   DM (diabetes mellitus) (HCC)   Acute on chronic systolic CHF (congestive heart failure) (HCC)   Ventricular tachycardia (HCC)   Liver cirrhosis (HCC)   AKI (acute kidney injury) (HCC)   Body mass index is 28.73 kg/m.   Cardiogenic shock: Hypotension has improved.  He is off of dopamine infusion.     Acute on chronic systolic CHF, s/p AICD: Torsemide has been decreased from 40 mg twice daily to 40 mg daily by cardiologist.  Metolazone has been discontinued.  Monitor BMP, daily weight and urine output.  2D echo showed EF estimated at 20%. S/p right heart cath on 12/09/2022 which showed pulmonary hypertension and elevated pulmonary capillary wedge consistent with diastolic CHF..  Follow-up with cardiologist for further recommendations.   Ventricular tachycardia: Continue amiodarone.  CAD s/p CABG: Continue aspirin   Type II DM with hyperglycemia: NovoLog as needed for hyperglycemia.   Interstitial lung disease, chronic hypoxic respiratory failure: Continue 2 L/min oxygen via [place] as needed.  His wife said ILD was attributed to COVID-19 infection   AKI on CKD stage IIIa: He was given IV fluids (Ringer's lactate 50 mL/h for 10 hours) overnight.  Creatinine is slowly trending upward.  Monitor kidney function closely.  Baseline creatinine is  around 1.5.   Liver cirrhosis: Abdominal ultrasound showed liver cirrhosis, cholelithiasis. Outpatient follow-up with gastroenterologist recommended.   Chronic hyponatremia: Likely from liver disease and CHF   Recommended palliative care consult.  However, patient declined.   Diet Order             Diet 2 gram sodium Room service appropriate? Yes; Fluid consistency: Thin; Fluid restriction: 1200 mL Fluid  Diet effective now                            Consultants: Cardiologist Intensivist  Procedures: S/p right heart cath    Medications:    amiodarone  400 mg Oral BID   Followed by   Melene Muller ON 12/11/2022] amiodarone  200 mg Oral Daily   aspirin EC  81 mg Oral Daily   Chlorhexidine Gluconate Cloth  6 each Topical Daily   enoxaparin (LOVENOX) injection  40 mg Subcutaneous Q24H   insulin aspart  0-15 Units Subcutaneous TID AC & HS   multivitamin with minerals  1 tablet Oral Daily   oxyCODONE  10 mg Oral Q6H   pravastatin  40 mg Oral QODAY   Ensure Max Protein  11 oz Oral Daily   sodium chloride flush  3 mL Intravenous Q12H   sodium chloride flush  3 mL Intravenous Q12H   torsemide  40 mg Oral Daily   Continuous Infusions:  sodium chloride     DOPamine Stopped (12/03/22 0838)     Anti-infectives (From admission, onward)  None              Family Communication/Anticipated D/C date and plan/Code Status   DVT prophylaxis: Place TED hose Start: 12/01/22 1057 enoxaparin (LOVENOX) injection 40 mg Start: 11/29/22 0800 SCDs Start: 11/28/22 2259     Code Status: Full Code  Family Communication: Plan discussed with his daughter at the bedside Disposition Plan: Plan to discharge home in 2 to 3 days   Status is: Inpatient Remains inpatient appropriate because: CHF exacerbation       Subjective:   Interval events noted.  He said he had trouble passing urine yesterday.  No shortness of breath or chest pain.  His urine output is  better today.  His daughter was at the bedside.    Objective:    Vitals:   12/04/22 0900 12/04/22 1000 12/04/22 1130 12/04/22 1200  BP: 100/70 97/84 105/71 (!) 113/43  Pulse: 75 88 78 75  Resp: 14 19 19 17   Temp:   98.1 F (36.7 C)   TempSrc:   Oral   SpO2: 97% 97% 96% 93%  Weight:      Height:       No data found.   Intake/Output Summary (Last 24 hours) at 12/04/2022 1521 Last data filed at 12/03/2022 1533 Gross per 24 hour  Intake 240 ml  Output --  Net 240 ml   Filed Weights   12/02/22 0500 12/03/22 0500 12/04/22 0500  Weight: 82.8 kg 83.1 kg 83.2 kg    Exam:  GEN: NAD SKIN: Warm and dry EYES: No pallor or icterus ENT: MMM CV: RRR PULM: CTA B ABD: soft, ND, NT, +BS CNS: AAO x 3, non focal EXT: Bilateral lower extremity edema, no erythema or tenderness   Data Reviewed:   I have personally reviewed following labs and imaging studies:  Labs: Labs show the following:   Basic Metabolic Panel: Recent Labs  Lab 11/28/22 0536 11/28/22 1606 11/30/22 0512 12/01/22 0647 12/02/22 0548 12/03/22 0524 12/03/22 1752 12/04/22 0341  NA 132*   < > 134* 130* 132* 130* 128* 128*  K 3.8   < > 3.9 3.6 4.1 4.2 4.6 4.9  CL 95*   < > 92* 89* 89* 88* 87* 85*  CO2 24   < > 30 24 29 30 28 26   GLUCOSE 170*   < > 131* 181* 86 113* 148* 104*  BUN 56*   < > 54* 52* 54* 65* 70* 70*  CREATININE 1.44*   < > 1.30* 1.35* 1.55* 1.95* 2.26* 2.30*  CALCIUM 8.4*   < > 8.9 8.9 8.9 8.9 8.8* 8.9  MG 2.3   < > 2.7* 2.3 2.3 2.3  --  2.3  PHOS 2.8  --  3.6  --   --   --   --   --    < > = values in this interval not displayed.   GFR Estimated Creatinine Clearance: 33.9 mL/min (A) (by C-G formula based on SCr of 2.3 mg/dL (H)). Liver Function Tests: Recent Labs  Lab 11/28/22 1855 11/30/22 0512  AST 42* 33  ALT 177* 125*  ALKPHOS 67 69  BILITOT 1.5* 1.5*  PROT 7.5 7.6  ALBUMIN 3.3* 3.5   No results for input(s): "LIPASE", "AMYLASE" in the last 168 hours. No results for  input(s): "AMMONIA" in the last 168 hours. Coagulation profile No results for input(s): "INR", "PROTIME" in the last 168 hours.  CBC: Recent Labs  Lab 11/28/22 0536 11/28/22 1606 11/29/22 1137 11/29/22 1602  11/29/22 1607 11/30/22 0512  WBC 8.9 9.3 6.0  --   --  5.8  HGB 10.7* 12.2* 11.6* 12.6* 12.9* 12.0*  HCT 33.1* 39.6 37.5* 37.0* 38.0* 37.9*  MCV 95.1 98.0 97.2  --   --  94.8  PLT 206 185 189  --   --  216   Cardiac Enzymes: No results for input(s): "CKTOTAL", "CKMB", "CKMBINDEX", "TROPONINI" in the last 168 hours. BNP (last 3 results) No results for input(s): "PROBNP" in the last 8760 hours. CBG: Recent Labs  Lab 12/03/22 1126 12/03/22 1611 12/03/22 2114 12/04/22 0736 12/04/22 1131  GLUCAP 168* 191* 93 90 203*   D-Dimer: No results for input(s): "DDIMER" in the last 72 hours. Hgb A1c: No results for input(s): "HGBA1C" in the last 72 hours.  Lipid Profile: No results for input(s): "CHOL", "HDL", "LDLCALC", "TRIG", "CHOLHDL", "LDLDIRECT" in the last 72 hours. Thyroid function studies: No results for input(s): "TSH", "T4TOTAL", "T3FREE", "THYROIDAB" in the last 72 hours.  Invalid input(s): "FREET3" Anemia work up: No results for input(s): "VITAMINB12", "FOLATE", "FERRITIN", "TIBC", "IRON", "RETICCTPCT" in the last 72 hours. Sepsis Labs: Recent Labs  Lab 11/28/22 0536 11/28/22 1606 11/29/22 1137 11/30/22 0512  WBC 8.9 9.3 6.0 5.8    Microbiology Recent Results (from the past 240 hour(s))  MRSA Next Gen by PCR, Nasal     Status: None   Collection Time: 11/29/22  1:06 AM   Specimen: Nasal Mucosa; Nasal Swab  Result Value Ref Range Status   MRSA by PCR Next Gen NOT DETECTED NOT DETECTED Final    Comment: (NOTE) The GeneXpert MRSA Assay (FDA approved for NASAL specimens only), is one component of a comprehensive MRSA colonization surveillance program. It is not intended to diagnose MRSA infection nor to guide or monitor treatment for MRSA  infections. Test performance is not FDA approved in patients less than 35 years old. Performed at [facility], [address] [place], [place] [address]     Procedures and diagnostic studies:  No results found.             LOS: 6 days   Azahel Belcastro  [facility]   Pager on [email or web address]. If 7PM-7AM, please contact night-coverage at [email or web address]     12/04/2022, 3:21 PM

## 2022-12-04 NOTE — Progress Notes (Signed)
Unable to visit with pt to complete HCPOA. Will make second attempt in a little bit.

## 2022-12-04 NOTE — Progress Notes (Signed)
[facility] Cardiology  SUBJECTIVE: Patient sitting on side of the bed, reports feeling much better   Vitals:   12/04/22 0400 12/04/22 0500 12/04/22 0600 12/04/22 0701  BP: 100/69 108/65 110/82   Pulse: 80 83 (!) 110   Resp:  (!) 21 14   Temp:    97.6 F (36.4 C)  TempSrc:    Oral  SpO2: 92% 92% 93%   Weight:  83.2 kg    Height:         Intake/Output Summary (Last 24 hours) at 12/04/2022 8295 Last data filed at 12/03/2022 1533 Gross per 24 hour  Intake 604.07 ml  Output 200 ml  Net 404.07 ml      PHYSICAL EXAM  General: Well developed, well nourished, in no acute distress HEENT:  Normocephalic and atramatic Neck:  No JVD.  Lungs: Clear bilaterally to auscultation and percussion. Heart: HRRR . Normal S1 and S2 without gallops or murmurs.  Abdomen: Bowel sounds are positive, abdomen soft and non-tender  Msk:  Back normal, normal gait. Normal strength and tone for age. Extremities: No clubbing, cyanosis or edema.   Neuro: Alert and oriented X 3. Psych:  Good affect, responds appropriately   LABS: Basic Metabolic Panel: Recent Labs    12/03/22 0524 12/03/22 1752 12/04/22 0341  NA 130* 128* 128*  K 4.2 4.6 4.9  CL 88* 87* 85*  CO2 30 28 26   GLUCOSE 113* 148* 104*  BUN 65* 70* 70*  CREATININE 1.95* 2.26* 2.30*  CALCIUM 8.9 8.8* 8.9  MG 2.3  --  2.3   Liver Function Tests: No results for input(s): "AST", "ALT", "ALKPHOS", "BILITOT", "PROT", "ALBUMIN" in the last 72 hours. No results for input(s): "LIPASE", "AMYLASE" in the last 72 hours. CBC: No results for input(s): "WBC", "NEUTROABS", "HGB", "HCT", "MCV", "PLT" in the last 72 hours. Cardiac Enzymes: No results for input(s): "CKTOTAL", "CKMB", "CKMBINDEX", "TROPONINI" in the last 72 hours. BNP: Invalid input(s): "POCBNP" D-Dimer: No results for input(s): "DDIMER" in the last 72 hours. Hemoglobin A1C: No results for input(s): "HGBA1C" in the last 72 hours. Fasting Lipid Panel: No results for input(s): "CHOL",  "HDL", "LDLCALC", "TRIG", "CHOLHDL", "LDLDIRECT" in the last 72 hours. Thyroid Function Tests: No results for input(s): "TSH", "T4TOTAL", "T3FREE", "THYROIDAB" in the last 72 hours.  Invalid input(s): "FREET3" Anemia Panel: No results for input(s): "VITAMINB12", "FOLATE", "FERRITIN", "TIBC", "IRON", "RETICCTPCT" in the last 72 hours.  No results found.   Echo severely reduced left ventricular function, LVEF less than 20%, moderate mitral regurgitation 11/29/2022  TELEMETRY: Sinus rhythm with PVCs:  ASSESSMENT AND PLAN:  Principal Problem:   Cardiogenic shock (HCC) Active Problems:   PAD (peripheral artery disease) (HCC)   CAD (coronary artery disease)   DM (diabetes mellitus) (HCC)   Acute on chronic systolic CHF (congestive heart failure) (HCC)   Ventricular tachycardia (HCC)   Liver cirrhosis (HCC)   AKI (acute kidney injury) (HCC)    1.  Acute on chronic HFrEF, EF less than 20% 11/29/2022, right heart cath revealed PCWP 31 mmHg, a 77/33, mean 50 mmHg, cardiac output 4.02, cardiac index 2.01, on dopamine to maintain MAP 65 better, furosemide drip transitioned to torsemide 40 mg twice daily with overall good diuresis, good medical management limited by low blood pressure 2.  CAD, CABG x 3 (LIMA-LAD, SVG-OM2 and PDA 12/09/2013), no chest pain 3.  Sustained VT, 39 seconds, received 1 g IV load amiodarone, addition to 400 mg p.o. twice daily for 10 days, then 200 mg daily  to be started 12/11/2022 4.  CKD stage IIIb, BUN and creatinine 70 and 2.30, respectively   Recommendations   1.  Agree with current therapy 2.  Torsemide 40 mg daily 3.  Metolazone 5 mg daily as needed 4.  Continue amiodarone 400 mg twice daily, transition to 200 mg daily 12/11/2022 5.  Carvedilol 3.125 mg twice daily when blood pressure permits 6.  Transfer to telemetry 7.  Will require advanced heart failure care, possible LVAD in the near future     Marcina Millard, MD, PhD, [facility] 12/04/2022 8:26  AM

## 2022-12-05 ENCOUNTER — Inpatient Hospital Stay: Payer: Medicare HMO

## 2022-12-05 DIAGNOSIS — N179 Acute kidney failure, unspecified: Secondary | ICD-10-CM | POA: Diagnosis not present

## 2022-12-05 DIAGNOSIS — I5023 Acute on chronic systolic (congestive) heart failure: Secondary | ICD-10-CM | POA: Diagnosis not present

## 2022-12-05 DIAGNOSIS — R570 Cardiogenic shock: Secondary | ICD-10-CM | POA: Diagnosis not present

## 2022-12-05 LAB — URINALYSIS, COMPLETE (UACMP) WITH MICROSCOPIC
Bilirubin Urine: NEGATIVE
Glucose, UA: NEGATIVE mg/dL
Hgb urine dipstick: NEGATIVE
Leukocytes,Ua: NEGATIVE
Nitrite: NEGATIVE
Protein, ur: 100 mg/dL — AB
Specific Gravity, Urine: 1.015 (ref 1.005–1.030)
pH: 5 (ref 5.0–8.0)

## 2022-12-05 LAB — PROTEIN / CREATININE RATIO, URINE
Creatinine, Urine: 78 mg/dL
Protein Creatinine Ratio: 0.83 mg/mg{Cre} — ABNORMAL HIGH (ref 0.00–0.15)
Total Protein, Urine: 65 mg/dL

## 2022-12-05 LAB — BASIC METABOLIC PANEL
Anion gap: 16 — ABNORMAL HIGH (ref 5–15)
BUN: 83 mg/dL — ABNORMAL HIGH (ref 8–23)
CO2: 26 mmol/L (ref 22–32)
Calcium: 9 mg/dL (ref 8.9–10.3)
Chloride: 85 mmol/L — ABNORMAL LOW (ref 98–111)
Creatinine, Ser: 2.44 mg/dL — ABNORMAL HIGH (ref 0.61–1.24)
GFR, Estimated: 29 mL/min — ABNORMAL LOW (ref >60)
Glucose, Bld: 166 mg/dL — ABNORMAL HIGH (ref 70–99)
Potassium: 4.9 mmol/L (ref 3.5–5.1)
Sodium: 127 mmol/L — ABNORMAL LOW (ref 135–145)

## 2022-12-05 LAB — CBC WITH DIFFERENTIAL/PLATELET
Abs Immature Granulocytes: 0.02 10*3/uL (ref 0.00–0.07)
Basophils Absolute: 0 10*3/uL (ref 0.0–0.1)
Basophils Relative: 0 %
Eosinophils Absolute: 0.2 10*3/uL (ref 0.0–0.5)
Eosinophils Relative: 4 %
HCT: 37.4 % — ABNORMAL LOW (ref 39.0–52.0)
Hemoglobin: 12 g/dL — ABNORMAL LOW (ref 13.0–17.0)
Immature Granulocytes: 0 %
Lymphocytes Relative: 9 %
Lymphs Abs: 0.6 10*3/uL — ABNORMAL LOW (ref 0.7–4.0)
MCH: 29.7 pg (ref 26.0–34.0)
MCHC: 32.1 g/dL (ref 30.0–36.0)
MCV: 92.6 fL (ref 80.0–100.0)
Monocytes Absolute: 1.2 10*3/uL — ABNORMAL HIGH (ref 0.1–1.0)
Monocytes Relative: 20 %
Neutro Abs: 3.9 10*3/uL (ref 1.7–7.7)
Neutrophils Relative %: 67 %
Platelets: 231 10*3/uL (ref 150–400)
RBC: 4.04 MIL/uL — ABNORMAL LOW (ref 4.22–5.81)
RDW: 14.6 % (ref 11.5–15.5)
WBC: 5.9 10*3/uL (ref 4.0–10.5)
nRBC: 0 % (ref 0.0–0.2)

## 2022-12-05 LAB — GLUCOSE, CAPILLARY
Glucose-Capillary: 173 mg/dL — ABNORMAL HIGH (ref 70–99)
Glucose-Capillary: 179 mg/dL — ABNORMAL HIGH (ref 70–99)
Glucose-Capillary: 94 mg/dL (ref 70–99)
Glucose-Capillary: 109 mg/dL — ABNORMAL HIGH (ref 70–99)

## 2022-12-05 LAB — SODIUM, URINE, RANDOM: Sodium, Ur: 15 mmol/L

## 2022-12-05 LAB — OSMOLALITY, URINE: Osmolality, Ur: 289 mOsm/kg — ABNORMAL LOW (ref 300–900)

## 2022-12-05 LAB — OSMOLALITY: Osmolality: 311 mOsm/kg — ABNORMAL HIGH (ref 275–295)

## 2022-12-05 MED ORDER — TORSEMIDE 20 MG PO TABS: 40.0000 mg | ORAL_TABLET | Freq: Every day | ORAL | Status: DC

## 2022-12-05 MED ORDER — TORSEMIDE 20 MG PO TABS: 20.0000 mg | ORAL_TABLET | Freq: Every day | ORAL | Status: DC

## 2022-12-05 NOTE — Progress Notes (Signed)
1810 -report called and pt transferred to room 242A. Pt is alert and oriented x4. Pts v/s are stable at this time and pt has no c/o pain or discomfort noted or stated.

## 2022-12-05 NOTE — Progress Notes (Signed)
Progress Note    [name]  WUJ:[id number] DOB: [birth year]  DOA: 11/28/2022 PCP: Barbette Reichmann, MD      Brief Narrative:    Medical records reviewed and are as summarized below:  [name] is a 62 y.o. male PMH of HFrEF (20%), s/p primary prevention ICD 07/2022, CAD s/p CABG x 3 (LIMA-LAD, SVG-OM2, SVG-PDA 12/09/2013), ILD/chronic respiratory failure (2L), DM2, who presented to the hospital with shortness of breath, chest tightness and decreased appetite.        Assessment/Plan:   Principal Problem:   Cardiogenic shock (HCC) Active Problems:   PAD (peripheral artery disease) (HCC)   CAD (coronary artery disease)   DM (diabetes mellitus) (HCC)   Acute on chronic systolic CHF (congestive heart failure) (HCC)   Ventricular tachycardia (HCC)   Liver cirrhosis (HCC)   AKI (acute kidney injury) (HCC)   Body mass index is 28.73 kg/m.   Cardiogenic shock: Hypotension has improved.  He is off of dopamine infusion.     Acute on chronic systolic CHF, s/p AICD: Torsemide has been discontinued because of AKI.  Cardiologist recommended restarting torsemide at 20 mg daily (half the previous dose).  Monitor BMP, daily weight and urine output.  2D echo showed EF estimated at 20%. S/p right heart cath on 12/09/2022 which showed pulmonary hypertension and elevated pulmonary capillary wedge consistent with diastolic CHF..  Follow-up with cardiologist for further recommendations.   Ventricular tachycardia: Continue amiodarone.   CAD s/p CABG: Continue aspirin   Type II DM with hyperglycemia: NovoLog as needed for hyperglycemia.   Interstitial lung disease, chronic hypoxic respiratory failure: Continue 2 L/min oxygen via [place] as needed.  His wife said ILD was attributed to COVID-19 infection   AKI on CKD stage IIIa: Worsening AKI.  Creatinine still trending upward.  Torsemide has been held.  Consulted Dr. Wynelle Link, nephrologist to assist with management.  Monitor  kidney function closely.  Baseline creatinine is around 1.5.   Liver cirrhosis: Abdominal ultrasound showed liver cirrhosis, cholelithiasis. Outpatient follow-up with gastroenterologist recommended.   Chronic hyponatremia: Likely from liver disease and CHF   Recommended palliative care consult.  However, patient declined.   Diet Order             Diet 2 gram sodium Room service appropriate? Yes; Fluid consistency: Thin; Fluid restriction: 1200 mL Fluid  Diet effective now                            Consultants: Cardiologist Intensivist  Procedures: S/p right heart cath    Medications:    amiodarone  400 mg Oral BID   Followed by   Melene Muller ON 12/11/2022] amiodarone  200 mg Oral Daily   aspirin EC  81 mg Oral Daily   Chlorhexidine Gluconate Cloth  6 each Topical Daily   enoxaparin (LOVENOX) injection  40 mg Subcutaneous Q24H   insulin aspart  0-15 Units Subcutaneous TID AC & HS   multivitamin with minerals  1 tablet Oral Daily   oxyCODONE  10 mg Oral Q6H   pravastatin  40 mg Oral QODAY   Ensure Max Protein  11 oz Oral Daily   sodium chloride flush  3 mL Intravenous Q12H   sodium chloride flush  3 mL Intravenous Q12H   [START ON 12/06/2022] torsemide  40 mg Oral Daily   Continuous Infusions:  sodium chloride       Anti-infectives (From admission, onward)  None              Family Communication/Anticipated D/C date and plan/Code Status   DVT prophylaxis: Place TED hose Start: 12/01/22 1057 enoxaparin (LOVENOX) injection 40 mg Start: 11/29/22 0800 SCDs Start: 11/28/22 2259     Code Status: Full Code  Family Communication: Plan discussed with his daughter at the bedside Disposition Plan: Plan to discharge home in 2 to 3 days   Status is: Inpatient Remains inpatient appropriate because: CHF exacerbation       Subjective:   Interval events noted.  He complains of decreased urine output.  No shortness of breath or chest pain.   Overall, he feels better.  His wife was at the bedside.  Objective:    Vitals:   12/05/22 1000 12/05/22 1120 12/05/22 1200 12/05/22 1300  BP:  (!) 101/59    Pulse: 82 79  81  Resp: 17 (!) 22  19  Temp:   97.6 F (36.4 C)   TempSrc:   Oral   SpO2: 93% 93%  93%  Weight:      Height:       No data found.   Intake/Output Summary (Last 24 hours) at 12/05/2022 1359 Last data filed at 12/05/2022 0600 Gross per 24 hour  Intake 675 ml  Output 250 ml  Net 425 ml   Filed Weights   12/02/22 0500 12/03/22 0500 12/04/22 0500  Weight: 82.8 kg 83.1 kg 83.2 kg    Exam:  GEN: NAD SKIN: Warm and dry EYES: EOMI ENT: MMM CV: RRR PULM: CTA B ABD: soft, ND, NT, +BS CNS: AAO x 3, non focal EXT: B/l leg edema, no tenderness    Data Reviewed:   I have personally reviewed following labs and imaging studies:  Labs: Labs show the following:   Basic Metabolic Panel: Recent Labs  Lab 11/30/22 0512 12/01/22 0647 12/02/22 0548 12/03/22 0524 12/03/22 1752 12/04/22 0341 12/05/22 0430  NA 134* 130* 132* 130* 128* 128* 127*  K 3.9 3.6 4.1 4.2 4.6 4.9 4.9  CL 92* 89* 89* 88* 87* 85* 85*  CO2 30 24 29 30 28 26 26   GLUCOSE 131* 181* 86 113* 148* 104* 166*  BUN 54* 52* 54* 65* 70* 70* 83*  CREATININE 1.30* 1.35* 1.55* 1.95* 2.26* 2.30* 2.44*  CALCIUM 8.9 8.9 8.9 8.9 8.8* 8.9 9.0  MG 2.7* 2.3 2.3 2.3  --  2.3  --   PHOS 3.6  --   --   --   --   --   --    GFR Estimated Creatinine Clearance: 32 mL/min (A) (by C-G formula based on SCr of 2.44 mg/dL (H)). Liver Function Tests: Recent Labs  Lab 11/28/22 1855 11/30/22 0512  AST 42* 33  ALT 177* 125*  ALKPHOS 67 69  BILITOT 1.5* 1.5*  PROT 7.5 7.6  ALBUMIN 3.3* 3.5   No results for input(s): "LIPASE", "AMYLASE" in the last 168 hours. No results for input(s): "AMMONIA" in the last 168 hours. Coagulation profile No results for input(s): "INR", "PROTIME" in the last 168 hours.  CBC: Recent Labs  Lab 11/28/22 1606  11/29/22 1137 11/29/22 1602 11/29/22 1607 11/30/22 0512 12/05/22 0430  WBC 9.3 6.0  --   --  5.8 5.9  NEUTROABS  --   --   --   --   --  3.9  HGB 12.2* 11.6* 12.6* 12.9* 12.0* 12.0*  HCT 39.6 37.5* 37.0* 38.0* 37.9* 37.4*  MCV 98.0 97.2  --   --  94.8 92.6  PLT 185 189  --   --  216 231   Cardiac Enzymes: No results for input(s): "CKTOTAL", "CKMB", "CKMBINDEX", "TROPONINI" in the last 168 hours. BNP (last 3 results) No results for input(s): "PROBNP" in the last 8760 hours. CBG: Recent Labs  Lab 12/04/22 1131 12/04/22 1557 12/04/22 2116 Dec 14, 2022 0746 2022/12/14 1214  GLUCAP 203* 104* 116* 173* 179*   D-Dimer: No results for input(s): "DDIMER" in the last 72 hours. Hgb A1c: No results for input(s): "HGBA1C" in the last 72 hours.  Lipid Profile: No results for input(s): "CHOL", "HDL", "LDLCALC", "TRIG", "CHOLHDL", "LDLDIRECT" in the last 72 hours. Thyroid function studies: No results for input(s): "TSH", "T4TOTAL", "T3FREE", "THYROIDAB" in the last 72 hours.  Invalid input(s): "FREET3" Anemia work up: No results for input(s): "VITAMINB12", "FOLATE", "FERRITIN", "TIBC", "IRON", "RETICCTPCT" in the last 72 hours. Sepsis Labs: Recent Labs  Lab 11/28/22 1606 11/29/22 1137 11/30/22 0512 14-Dec-2022 0430  WBC 9.3 6.0 5.8 5.9    Microbiology Recent Results (from the past 240 hour(s))  MRSA Next Gen by PCR, Nasal     Status: None   Collection Time: 11/29/22  1:06 AM   Specimen: Nasal Mucosa; Nasal Swab  Result Value Ref Range Status   MRSA by PCR Next Gen NOT DETECTED NOT DETECTED Final    Comment: (NOTE) The GeneXpert MRSA Assay (FDA approved for NASAL specimens only), is one component of a comprehensive MRSA colonization surveillance program. It is not intended to diagnose MRSA infection nor to guide or monitor treatment for MRSA infections. Test performance is not FDA approved in patients less than 43 years old. Performed at [facility], [address] [place], [place] [address]     Procedures and diagnostic studies:  US RENAL  Result Date: Dec 14, 2022 CLINICAL DATA:  Acute renal failure, proteinuria and chronic kidney disease. EXAM: RENAL / URINARY TRACT ULTRASOUND COMPLETE COMPARISON:  None Available. FINDINGS: Right Kidney: Renal measurements: 9.2 x 5.3 x 5.2 cm = volume: 124 mL. Echogenicity within normal limits. No mass or hydronephrosis visualized. Left Kidney: Renal measurements: 9.0 x 5.6 x 5.2 cm = volume: 136 mL. Echogenicity within normal limits. No mass or hydronephrosis visualized. Bladder: Appears normal for degree of bladder distention. Other: None. IMPRESSION: Unremarkable ultrasound of the kidneys and bladder. Electronically Signed   By: Irish Lack M.D.   On: Dec 14, 2022 11:56               LOS: 7 days   Anarosa Kubisiak  [facility]   Pager on [email or web address]. If 7PM-7AM, please contact night-coverage at [email or web address]     12/14/2022, 1:59 PM

## 2022-12-05 NOTE — Progress Notes (Signed)
   12/05/22 1100  Spiritual Encounters  Type of Visit Initial  Care provided to: Pt and family  Referral source Nurse (RN/NT/LPN)  Reason for visit Routine spiritual support  OnCall Visit No   Chaplain responded to nurse consult. Chaplain provided compassionate presence and reflective listening as patient spoke about health challenges. Patient has also filled out advance directive paperwork but did not desire to complete and notarize them today. He said he "will save it for tomorrow". Wife [name] was supportive of patient's decisions. Patient shared life values and beliefs with Chaplain. Chaplain services will follow up with patient and wife tomorrow to check in about completing advance directive paperwork.

## 2022-12-05 NOTE — Progress Notes (Signed)
[facility] Cardiology  Patient ID: [name] MRN: [id number] DOB/AGE: Oct 11, 1960 62 y.o.   Admit date: 11/28/2022 Referring Physician Eual Fines, NP  Primary Physician Dr. Marcello Fennel Primary Cardiologist Dr. Darrold Junker Reason for Consultation AoCHF   HPI: [name] ia a [phone] with a PMH of HFrEF (20%), s/p primary prevention ICD 07/2022, CAD s/p CABG x 3 (LIMA-LAD, SVG-OM2, SVG-PDA 12/09/2013), ILD/chronic respiratory failure (2L), DM2 who presented initially to [facility] clinic with 2 months of worsening dyspnea on exertion, ~13 pound weight gain in the past week and abdominal distention despite taking metolazone. He was referred directly to [facility] ED, where he was hypotensive to 70/30 and hypoxic, requiring levophed & 2L by [place]. Cardiology consulted for assistance with his CHF exacerbation.    Interval History:  - Cr starting to plateau, torsemide decreased from twice daily to once daily yesterday - off dopamine entirely x 2 days and on room air - no chest pain or dyspnea. Peripheral edema stable. Weight today stable at 183lbs   Vitals:   12/05/22 0300 12/05/22 0400 12/05/22 0500 12/05/22 0600  BP:   114/71   Pulse: 79 76 80 (!) 118  Resp: (!) 21   18  Temp:   97.8 F (36.6 C)   TempSrc:   Oral   SpO2: 93% (!) 88% 94% 97%  Weight:      Height:         Intake/Output Summary (Last 24 hours) at 12/05/2022 1914 Last data filed at 12/05/2022 0600 Gross per 24 hour  Intake 1155 ml  Output 250 ml  Net 905 ml       PHYSICAL EXAM General: pleasant chronically ill appearing caucasian male, appears older than chronological age in no acute distress.  Sitting upright in stepdown bed with wife and daughter at bedside  HEENT:  Normocephalic and atraumatic. Neck:   No JVD.  Lungs: Normal respiratory effort on room air Trace rhonchi in bases with decreased breath sounds in upper lung fields no wheezing.   Heart: Regular rate and rhythm normal S1 and S2 without gallops or murmurs.   Abdomen:  non distended appearing Msk: Normal strength and tone for age. Extremities: Feet are cool to touch, chronic brawny hyperpigmentation trace bilateral LE edema  Neuro: Alert and oriented X 3. Psych:  Answers questions appropriately.    LABS: Basic Metabolic Panel: Recent Labs    12/03/22 0524 12/03/22 1752 12/04/22 0341 12/05/22 0430  NA 130*   < > 128* 127*  K 4.2   < > 4.9 4.9  CL 88*   < > 85* 85*  CO2 30   < > 26 26  GLUCOSE 113*   < > 104* 166*  BUN 65*   < > 70* 83*  CREATININE 1.95*   < > 2.30* 2.44*  CALCIUM 8.9   < > 8.9 9.0  MG 2.3  --  2.3  --    < > = values in this interval not displayed.    Liver Function Tests: No results for input(s): "AST", "ALT", "ALKPHOS", "BILITOT", "PROT", "ALBUMIN" in the last 72 hours. No results for input(s): "LIPASE", "AMYLASE" in the last 72 hours. CBC: Recent Labs    12/05/22 0430  WBC 5.9  NEUTROABS 3.9  HGB 12.0*  HCT 37.4*  MCV 92.6  PLT 231   Cardiac Enzymes: No results for input(s): "CKTOTAL", "CKMB", "CKMBINDEX", "TROPONINI" in the last 72 hours. BNP: Invalid input(s): "POCBNP" D-Dimer: No results for input(s): "DDIMER" in the last 72  hours. Hemoglobin A1C: No results for input(s): "HGBA1C" in the last 72 hours. Fasting Lipid Panel: No results for input(s): "CHOL", "HDL", "LDLCALC", "TRIG", "CHOLHDL", "LDLDIRECT" in the last 72 hours. Thyroid Function Tests: No results for input(s): "TSH", "T4TOTAL", "T3FREE", "THYROIDAB" in the last 72 hours.  Invalid input(s): "FREET3" Anemia Panel: No results for input(s): "VITAMINB12", "FOLATE", "FERRITIN", "TIBC", "IRON", "RETICCTPCT" in the last 72 hours.  No results found.   Echo severely reduced left ventricular function, LVEF less than 20%, moderate mitral regurgitation 11/29/2022  TELEMETRY: Sinus rhythm with PVCs:  ASSESSMENT AND PLAN:  Principal Problem:   Cardiogenic shock (HCC) Active Problems:   PAD (peripheral artery disease) (HCC)   CAD  (coronary artery disease)   DM (diabetes mellitus) (HCC)   Acute on chronic systolic CHF (congestive heart failure) (HCC)   Ventricular tachycardia (HCC)   Liver cirrhosis (HCC)   AKI (acute kidney injury) (HCC)    1.  Acute on chronic HFrEF, EF less than 20% 11/29/2022, right heart cath revealed PCWP 31 mmHg, a 77/33, mean 50 mmHg, cardiac output 4.02, cardiac index 2.01, on dopamine to maintain MAP 65 better, furosemide drip transitioned to torsemide 40 mg twice daily with overall good diuresis, good medical management limited by low blood pressure 2.  CAD, CABG x 3 (LIMA-LAD, SVG-OM2 and PDA 12/09/2013), no chest pain 3.  Sustained VT, 39 seconds, received 1 g IV load amiodarone, addition to 400 mg p.o. twice daily for 10 days, then 200 mg daily to be started 12/11/2022 4.  CKD stage IIIb, BUN and creatinine 83 and 2.44, respectively   Recommendations   1.  Agree with current therapy 2.  Hold Torsemide 40 mg today and resume tomorrow 3.  Hold further Metolazone 5 mg daily  4.  Continue amiodarone 400 mg twice daily, transition to 200 mg daily 12/11/2022 5.  Defer resuming Carvedilol 3.125 mg twice daily as BP is low normal 6.  Transfer to telemetry 7.  Will require advanced heart failure care, possible LVAD in the near future. He has follow up appt on Tuesday 5/21 at 2pm with Dr. Cheral [facility]).  8.  Anticipate discharge readiness from a cardiac perspective tomorrow morning, 5/14 if the patient continues to do well.   This patient's plan of care was discussed and created with Dr. Darrold Junker and he is in agreement.    Rebeca Allegra, PA-C 12/05/2022 8:21 AM

## 2022-12-05 NOTE — Consult Note (Signed)
[facility]  CONSULT NOTE    Date: 12/05/2022                  Patient Name:  [name]  MRN: [id number]  DOB: [birth year]  Age / Sex: 62 y.o., male         PCP: Barbette Reichmann, MD                 Service Requesting Consult: Dr. Myriam Forehand                 Reason for Consult: Acute kidney injury            History of Present Illness: Mr. MOHMED HASPEL admitted to [facility] with acute exacerbation of congestive heart failure. Right heart cath shows pulmonary hypertension and echo with systolic and diastolic congestive heart failure.   Patient states he has known that he has chronic kidney disease with proteinuria secondary to diabetes. He does not follow with nephrology.   Patient states his volume status has been controlled with outpatient torsemide and metolazone. Creatinine rose to 2.44 today and sodium down to 127. Diuretics held and nephrology consulted.   Wife at bedside.    Medications: Outpatient medications: Medications Prior to Admission  Medication Sig Dispense Refill Last Dose   albuterol (PROVENTIL HFA;VENTOLIN HFA) 108 (90 Base) MCG/ACT inhaler Inhale 2 puffs into the lungs every 6 (six) hours as needed for wheezing or shortness of breath.   unk at unk   aspirin EC 81 MG tablet Take 81 mg by mouth daily.   Past Week   Buprenorphine HCl (BELBUCA) 450 MCG FILM Place 1 Film (450 mcg total) inside cheek every 12 (twelve) hours. 60 each 2 11/27/2022   carvedilol (COREG) 3.125 MG tablet Take by mouth.   11/28/2022   furosemide (LASIX) 40 MG tablet Take 40 mg by mouth 2 (two) times daily.   11/28/2022   HYDROcodone-acetaminophen (NORCO) 10-325 MG tablet Take 1 tablet by mouth every 8 (eight) hours as needed for severe pain. Must last 30 days. 90 tablet 0 11/27/2022   metFORMIN (GLUCOPHAGE) 500 MG tablet Take 500 mg by mouth 2 (two) times daily with a meal.   Past Month   metolazone (ZAROXOLYN) 5 MG tablet Take 5 mg by mouth daily as needed.   11/28/2022    Pirfenidone (ESBRIET) 267 MG CAPS Take 801 mg by mouth 2 (two) times daily.    11/28/2022   pravastatin (PRAVACHOL) 40 MG tablet Take 40 mg by mouth every other day.    11/27/2022   sodium chloride (OCEAN) 0.65 % nasal spray Place 1 spray into the nose as needed (dryness).   unk at unk   tamsulosin (FLOMAX) 0.4 MG CAPS capsule Take 0.4 mg by mouth every evening.  3 Past Month   acetaminophen (TYLENOL) 500 MG tablet Take 1 tablet (500 mg total) by mouth every 8 (eight) hours as needed for moderate pain or headache. (Patient not taking: Reported on 11/28/2022) 90 tablet 2 Not Taking   HYDROcodone-acetaminophen (NORCO) 10-325 MG tablet Take 1 tablet by mouth every 8 (eight) hours as needed for severe pain. Must last 30 days. 90 tablet 0    HYDROcodone-acetaminophen (NORCO) 10-325 MG tablet Take 1 tablet by mouth every 8 (eight) hours as needed for severe pain. Must last 30 days. 90 tablet 0    spironolactone (ALDACTONE) 25 MG tablet Take 1 tablet by mouth as needed. (Patient not taking: Reported on 11/28/2022)   Not  Taking    Current medications: Current Facility-Administered Medications  Medication Dose Route Frequency Provider Last Rate Last Admin   0.9 %  sodium chloride infusion  250 mL Intravenous PRN Paraschos, Alexander, MD       acetaminophen (TYLENOL) tablet 650 mg  650 mg Oral Q4H PRN Paraschos, Alexander, MD       albuterol (PROVENTIL) (2.5 MG/3ML) 0.083% nebulizer solution 2.5 mg  2.5 mg Inhalation Q6H PRN Rust-Chester, Cecelia Byars, NP       amiodarone (PACERONE) tablet 400 mg  400 mg Oral BID Rebeca Allegra, PA-C   400 mg at 12/05/22 1308   Followed by   Melene Muller ON 12/11/2022] amiodarone (PACERONE) tablet 200 mg  200 mg Oral Daily Tang, Cheryln Manly, PA-C       aspirin EC tablet 81 mg  81 mg Oral Daily Synthia Innocent, RPH   81 mg at 12/05/22 6578   bisacodyl (DULCOLAX) suppository 10 mg  10 mg Rectal Daily PRN Manuela Schwartz, NP   10 mg at 12/03/22 2110   Chlorhexidine Gluconate Cloth 2  % PADS 6 each  6 each Topical Daily Raechel Chute, MD   6 each at 12/05/22 0927   docusate sodium (COLACE) capsule 100 mg  100 mg Oral BID PRN Rust-Chester, Micheline Rough L, NP   100 mg at 12/04/22 0411   enoxaparin (LOVENOX) injection 40 mg  40 mg Subcutaneous Q24H Rust-Chester, Britton L, NP   40 mg at 12/05/22 0926   HYDROcodone-acetaminophen (NORCO) 10-325 MG per tablet 1 tablet  1 tablet Oral Q8H PRN Rust-Chester, Cecelia Byars, NP   1 tablet at 12/05/22 0610   insulin aspart (novoLOG) injection 0-15 Units  0-15 Units Subcutaneous TID AC & HS Manuela Schwartz, NP   3 Units at 12/05/22 1306   multivitamin with minerals tablet 1 tablet  1 tablet Oral Daily Raechel Chute, MD   1 tablet at 12/05/22 0927   ondansetron (ZOFRAN) injection 4 mg  4 mg Intravenous Q6H PRN Paraschos, Alexander, MD       oxyCODONE (Oxy IR/ROXICODONE) immediate release tablet 10 mg  10 mg Oral Q6H Rust-Chester, Britton L, NP   10 mg at 12/05/22 1411   polyethylene glycol (MIRALAX / GLYCOLAX) packet 17 g  17 g Oral Daily PRN Rust-Chester, Britton L, NP   17 g at 12/03/22 2301   pravastatin (PRAVACHOL) tablet 40 mg  40 mg Oral QODAY Tang, Lily Marcelino Duster, PA-C   40 mg at 12/05/22 4696   protein supplement (ENSURE MAX) liquid  11 oz Oral Daily Dgayli, Lianne Bushy, MD   11 oz at 12/05/22 0928   sodium chloride (OCEAN) 0.65 % nasal spray 1 spray  1 spray Each Nare PRN Lurene Shadow, MD   1 spray at 12/02/22 0102   sodium chloride flush (NS) 0.9 % injection 3 mL  3 mL Intravenous Q12H Tang, Cheryln Manly, PA-C   3 mL at 12/05/22 2952   sodium chloride flush (NS) 0.9 % injection 3 mL  3 mL Intravenous Q12H Paraschos, Alexander, MD   3 mL at 12/05/22 8413   sodium chloride flush (NS) 0.9 % injection 3 mL  3 mL Intravenous PRN Paraschos, Alexander, MD       [START ON 12/06/2022] torsemide (DEMADEX) tablet 40 mg  40 mg Oral Daily Tang, Cheryln Manly, PA-C          Allergies: No Known Allergies    Past Medical History: Past Medical History:   Diagnosis Date   Cancer (HCC)  skin (scalp)   CHF (congestive heart failure) (HCC)    COPD (chronic obstructive pulmonary disease) (HCC)    Coronary artery disease    Coronary artery disease    Diabetes mellitus without complication (HCC)    Dyspnea    Fibromyalgia    History of kidney stones    asymptomatic   IPF (idiopathic pulmonary fibrosis) (HCC)    MI (myocardial infarction) (HCC)    Peripheral vascular disease (HCC)    Pneumonia      Past Surgical History: Past Surgical History:  Procedure Laterality Date   APPLICATION OF WOUND VAC     chest wound s/p CABG   CARDIAC CATHETERIZATION     CARDIAC DEFIBRILLATOR PLACEMENT  08/15/2022   CORONARY ARTERY BYPASS GRAFT  2015   3 vessels   ENDARTERECTOMY Left 09/08/2017   Procedure: ENDARTERECTOMY CAROTID;  Surgeon: Renford Dills, MD;  Location: [facility] ORS;  Service: Vascular;  Laterality: Left;   LOWER EXTREMITY ANGIOGRAPHY Right 01/29/2019   Procedure: LOWER EXTREMITY ANGIOGRAPHY;  Surgeon: Renford Dills, MD;  Location: [facility] INVASIVE CV LAB;  Service: Cardiovascular;  Laterality: Right;   PERIPHERAL VASCULAR CATHETERIZATION N/A 06/09/2015   Procedure: Abdominal Aortogram w/Lower Extremity;  Surgeon: Renford Dills, MD;  Location: [facility] INVASIVE CV LAB;  Service: Cardiovascular;  Laterality: N/A;   PERIPHERAL VASCULAR CATHETERIZATION  06/09/2015   Procedure: Lower Extremity Intervention;  Surgeon: Renford Dills, MD;  Location: [facility] INVASIVE CV LAB;  Service: Cardiovascular;;   RIGHT HEART CATH N/A 11/29/2022   Procedure: RIGHT HEART CATH;  Surgeon: Marcina Millard, MD;  Location: [facility] INVASIVE CV LAB;  Service: Cardiovascular;  Laterality: N/A;   stent in leg Left    WOUND DEBRIDEMENT     debridement of chest wound x 2 s/p CABG      Family History: Family History  Problem Relation Age of Onset   Heart attack Mother    Cancer Father    Mental illness Neg Hx      Social History: Social History    Socioeconomic History   Marital status: Married    Spouse name: jerri   Number of children: 2   Years of education: Not on file   Highest education level: Some college, no degree  Occupational History   Not on file  Tobacco Use   Smoking status: Former    Types: Cigarettes    Quit date: 10/20/2013    Years since quitting: 9.1   Smokeless tobacco: Former  Building services engineer Use: Never used  Substance and Sexual Activity   Alcohol use: Yes    Comment: seldom   Drug use: No   Sexual activity: Not on file  Other Topics Concern   Not on file  Social History Narrative   Not on file   Social Determinants of Health   Financial Resource Strain: Low Risk  (01/28/2019)   Overall Financial Resource Strain (CARDIA)    Difficulty of Paying Living Expenses: Not hard at all  Food Insecurity: No Food Insecurity (12/03/2022)   Hunger Vital Sign    Worried About Running Out of Food in the Last Year: Never true    Ran Out of Food in the Last Year: Never true  Transportation Needs: No Transportation Needs (12/03/2022)   PRAPARE - Administrator, Civil Service (Medical): No    Lack of Transportation (Non-Medical): No  Physical Activity: Sufficiently Active (01/28/2019)   Exercise Vital Sign    Days of Exercise per Week:  5 days    Minutes of Exercise per Session: 30 min  Stress: Stress Concern Present (01/28/2019)   [employer] of Occupational Health - Occupational Stress Questionnaire    Feeling of Stress : Very much  Social Connections: Unknown (01/28/2019)   Social Connection and Isolation Panel [NHANES]    Frequency of Communication with Friends and Family: Not on file    Frequency of Social Gatherings with Friends and Family: Not on file    Attends Religious Services: Never    Active Member of Clubs or Organizations: No    Attends Banker Meetings: Never    Marital Status: Married  Catering manager Violence: Not At Risk (12/03/2022)   Humiliation, Afraid,  Rape, and Kick questionnaire    Fear of Current or Ex-Partner: No    Emotionally Abused: No    Physically Abused: No    Sexually Abused: No        Vital Signs: Blood pressure 115/79, pulse 80, temperature 97.7 F (36.5 C), temperature source Oral, resp. rate 16, height 5\' 7"  (1.702 m), weight 83.2 kg, SpO2 95 %.  Weight trends: Filed Weights   12/02/22 0500 12/03/22 0500 12/04/22 0500  Weight: 82.8 kg 83.1 kg 83.2 kg    Physical Exam: General: NAD, ill appearing  Head: Normocephalic, atraumatic. Moist oral mucosal membranes  Eyes: Anicteric, PERRL  Neck: Supple, trachea midline  Lungs:  Crackles at bases  Heart: Regular rate and rhythm  Abdomen:  +distended  Extremities:  ++ peripheral edema.  Neurologic: Nonfocal, moving all four extremities  Skin: No lesions  Access: none     Lab results: Basic Metabolic Panel: Recent Labs  Lab 11/30/22 0512 12/01/22 0647 12/02/22 0548 12/03/22 0524 12/03/22 1752 12/04/22 0341 12/05/22 0430  NA 134* 130* 132* 130* 128* 128* 127*  K 3.9 3.6 4.1 4.2 4.6 4.9 4.9  CL 92* 89* 89* 88* 87* 85* 85*  CO2 30 24 29 30 28 26 26   GLUCOSE 131* 181* 86 113* 148* 104* 166*  BUN 54* 52* 54* 65* 70* 70* 83*  CREATININE 1.30* 1.35* 1.55* 1.95* 2.26* 2.30* 2.44*  CALCIUM 8.9 8.9 8.9 8.9 8.8* 8.9 9.0  MG 2.7* 2.3 2.3 2.3  --  2.3  --   PHOS 3.6  --   --   --   --   --   --     Liver Function Tests: Recent Labs  Lab 11/28/22 1855 11/30/22 0512  AST 42* 33  ALT 177* 125*  ALKPHOS 67 69  BILITOT 1.5* 1.5*  PROT 7.5 7.6  ALBUMIN 3.3* 3.5   No results for input(s): "LIPASE", "AMYLASE" in the last 168 hours. No results for input(s): "AMMONIA" in the last 168 hours.  CBC: Recent Labs  Lab 11/29/22 1137 11/29/22 1602 11/29/22 1607 11/30/22 0512 12/05/22 0430  WBC 6.0  --   --  5.8 5.9  NEUTROABS  --   --   --   --  3.9  HGB 11.6*   < > 12.9* 12.0* 12.0*  HCT 37.5*   < > 38.0* 37.9* 37.4*  MCV 97.2  --   --  94.8 92.6  PLT  189  --   --  216 231   < > = values in this interval not displayed.    Cardiac Enzymes: No results for input(s): "CKTOTAL", "CKMB", "CKMBINDEX", "TROPONINI" in the last 168 hours.  BNP: Invalid input(s): "POCBNP"  CBG: Recent Labs  Lab 12/04/22 1557 12/04/22 2116 12/05/22 0746 12/05/22 1214  12/05/22 1548  GLUCAP 104* 116* 173* 179* 94    Microbiology: Results for orders placed or performed during the hospital encounter of 11/28/22  MRSA Next Gen by PCR, Nasal     Status: None   Collection Time: 11/29/22  1:06 AM   Specimen: Nasal Mucosa; Nasal Swab  Result Value Ref Range Status   MRSA by PCR Next Gen NOT DETECTED NOT DETECTED Final    Comment: (NOTE) The GeneXpert MRSA Assay (FDA approved for NASAL specimens only), is one component of a comprehensive MRSA colonization surveillance program. It is not intended to diagnose MRSA infection nor to guide or monitor treatment for MRSA infections. Test performance is not FDA approved in patients less than 24 years old. Performed at [facility], [address] [place], [place] [address]     Coagulation Studies: No results for input(s): "LABPROT", "INR" in the last 72 hours.  Urinalysis: Recent Labs    12/05/22 1515  COLORURINE YELLOW*  LABSPEC 1.015  PHURINE 5.0  GLUCOSEU NEGATIVE  HGBUR NEGATIVE  BILIRUBINUR NEGATIVE  KETONESUR TRACE*  PROTEINUR 100*  NITRITE NEGATIVE  LEUKOCYTESUR NEGATIVE      Imaging: US RENAL  Result Date: 12/05/2022 CLINICAL DATA:  Acute renal failure, proteinuria and chronic kidney disease. EXAM: RENAL / URINARY TRACT ULTRASOUND COMPLETE COMPARISON:  None Available. FINDINGS: Right Kidney: Renal measurements: 9.2 x 5.3 x 5.2 cm = volume: 124 mL. Echogenicity within normal limits. No mass or hydronephrosis visualized. Left Kidney: Renal measurements: 9.0 x 5.6 x 5.2 cm = volume: 136 mL. Echogenicity within normal limits. No mass or hydronephrosis visualized. Bladder: Appears  normal for degree of bladder distention. Other: None. IMPRESSION: Unremarkable ultrasound of the kidneys and bladder. Electronically Signed   By: Irish Lack M.D.   On: 12/05/2022 11:56     Assessment & Plan: Mr. GAGANDEEP SIEGERT is a 62 y.o.  male with systolic congestive heart failure, ILD/COPD, coronary artery disease, diabetes mellitus type II, fibromyalgia, and peripheral vascular disease , who was admitted to [facility] on 11/28/2022 for Cardiogenic shock (HCC) [R57.0] Acute pulmonary edema (HCC) [J81.0] SOB (shortness of breath) [R06.02] Acute on chronic congestive heart failure, unspecified heart failure type (HCC) [I50.9] Acute hypoxic respiratory failure (HCC) [J96.01]  Acute kidney injury on chronic kidney disease stage IIIB with proteinuria: baseline creatinine of 1.5, GFR of 52 however suspect kidney function might be worse due to patient's poor muscle mass Acute exacerbation of chronic systolic and diastolic congestive heart failure and hypertension:  Hyponatremia: with hypervolemia: secondary to congestive heart failure and hepatic cirrhosis.  - fluid restriction.  - loop diuretic as tolerated.  - holding metolazone. - not a candidate for renal replacement therapy.     LOS: 7 Rosabella Edgin 5/13/20244:49 PM

## 2022-12-06 DIAGNOSIS — R570 Cardiogenic shock: Secondary | ICD-10-CM | POA: Diagnosis not present

## 2022-12-06 DIAGNOSIS — I5023 Acute on chronic systolic (congestive) heart failure: Secondary | ICD-10-CM | POA: Diagnosis not present

## 2022-12-06 DIAGNOSIS — N179 Acute kidney failure, unspecified: Secondary | ICD-10-CM | POA: Diagnosis not present

## 2022-12-06 LAB — KAPPA/LAMBDA LIGHT CHAINS
Kappa free light chain: 146.7 mg/L — ABNORMAL HIGH (ref 3.3–19.4)
Kappa, lambda light chain ratio: 1.64 (ref 0.26–1.65)
Lambda free light chains: 89.3 mg/L — ABNORMAL HIGH (ref 5.7–26.3)

## 2022-12-06 LAB — BASIC METABOLIC PANEL
Anion gap: 16 — ABNORMAL HIGH (ref 5–15)
BUN: 92 mg/dL — ABNORMAL HIGH (ref 8–23)
CO2: 28 mmol/L (ref 22–32)
Calcium: 8.9 mg/dL (ref 8.9–10.3)
Chloride: 85 mmol/L — ABNORMAL LOW (ref 98–111)
Creatinine, Ser: 2.66 mg/dL — ABNORMAL HIGH (ref 0.61–1.24)
GFR, Estimated: 26 mL/min — ABNORMAL LOW (ref >60)
Glucose, Bld: 120 mg/dL — ABNORMAL HIGH (ref 70–99)
Potassium: 4.8 mmol/L (ref 3.5–5.1)
Sodium: 129 mmol/L — ABNORMAL LOW (ref 135–145)

## 2022-12-06 LAB — GLUCOSE, CAPILLARY
Glucose-Capillary: 121 mg/dL — ABNORMAL HIGH (ref 70–99)
Glucose-Capillary: 241 mg/dL — ABNORMAL HIGH (ref 70–99)
Glucose-Capillary: 233 mg/dL — ABNORMAL HIGH (ref 70–99)
Glucose-Capillary: 154 mg/dL — ABNORMAL HIGH (ref 70–99)

## 2022-12-06 MED ORDER — ENOXAPARIN SODIUM 30 MG/0.3ML IJ SOSY
30.0000 mg | PREFILLED_SYRINGE | INTRAMUSCULAR | Status: DC
  Administered 2022-12-07: 30 mg via SUBCUTANEOUS
  Filled 2022-12-06: qty 0.3

## 2022-12-06 MED ORDER — TORSEMIDE 20 MG PO TABS
40.0000 mg | ORAL_TABLET | Freq: Every day | ORAL | Status: DC
  Administered 2022-12-07: 40 mg via ORAL
  Filled 2022-12-06: qty 2

## 2022-12-06 NOTE — Care Management Important Message (Signed)
Important Message  Patient Details  Name: [name] MRN: [id number] Date of Birth: [birth year]   Medicare Important Message Given:  Yes     [name] 12/06/2022, 11:32 AM

## 2022-12-06 NOTE — Progress Notes (Signed)
[facility] Cardiology  Patient ID: [name] MRN: [id number] DOB/AGE: May 28, 1961 62 y.o.   Admit date: 11/28/2022 Referring Physician John Fines, NP  Primary Physician Dr. Marcello [name] Primary Cardiologist Dr. Darrold [name] Reason for Consultation AoCHF   HPI: [name] ia a [phone] with a PMH of HFrEF (20%), s/p primary prevention ICD 07/2022, CAD s/p CABG x 3 (LIMA-LAD, SVG-OM2, SVG-PDA 12/09/2013), ILD/chronic respiratory failure (2L), DM2 who presented initially to [facility] clinic with 2 months of worsening dyspnea on exertion, ~13 pound weight gain in the past week and abdominal distention despite taking metolazone. He was referred directly to [facility] ED, where he was hypotensive to 70/30 and hypoxic, requiring levophed & 2L by [place]. Cardiology consulted for assistance with his CHF exacerbation.    Interval History:  - Cr continues to uptrend from 2.30 --2.44 -- 2.66  -Continues to feel well, still making urine although not recorded, weights also not recorded the past 2 days -Denies chest pain or dyspnea, peripheral edema stable   Vitals:   12/06/22 0232 12/06/22 0501 12/06/22 0737 12/06/22 1120  BP: 92/68 98/73 (!) 101/51 (!) 114/90  Pulse: 71 80 73 78  Resp: 20 18 14 14   Temp: (!) 97.4 F (36.3 C) 97.6 F (36.4 C) (!) 97.5 F (36.4 C) 97.7 F (36.5 C)  TempSrc:   Oral Oral  SpO2: 91% 91% 95% 95%  Weight:      Height:        No intake or output data in the 24 hours ending 12/06/22 1526   PHYSICAL EXAM General: pleasant chronically ill appearing caucasian male, appears older than chronological age in no acute distress.  Sitting upright in stepdown bed with daughter at bedside  HEENT:  Normocephalic and atraumatic. Neck:   No JVD.  Lungs: Normal respiratory effort on room air Trace rhonchi in bases with decreased breath sounds in upper lung fields no wheezing.   Heart: Regular rate and rhythm normal S1 and S2 without gallops or murmurs.  Abdomen:  non distended  appearing Msk: Normal strength and tone for age. Extremities: Feet are cool to touch, chronic brawny hyperpigmentation trace-1+ pitting bilateral LE edema  Neuro: Alert and oriented X 3. Psych:  Answers questions appropriately.    LABS: Basic Metabolic Panel: Recent Labs    12/04/22 0341 12/05/22 0430 12/06/22 0354  NA 128* 127* 129*  K 4.9 4.9 4.8  CL 85* 85* 85*  CO2 26 26 28   GLUCOSE 104* 166* 120*  BUN 70* 83* 92*  CREATININE 2.30* 2.44* 2.66*  CALCIUM 8.9 9.0 8.9  MG 2.3  --   --     Liver Function Tests: No results for input(s): "AST", "ALT", "ALKPHOS", "BILITOT", "PROT", "ALBUMIN" in the last 72 hours. No results for input(s): "LIPASE", "AMYLASE" in the last 72 hours. CBC: Recent Labs    12/05/22 0430  WBC 5.9  NEUTROABS 3.9  HGB 12.0*  HCT 37.4*  MCV 92.6  PLT 231    Cardiac Enzymes: No results for input(s): "CKTOTAL", "CKMB", "CKMBINDEX", "TROPONINI" in the last 72 hours. BNP: Invalid input(s): "POCBNP" D-Dimer: No results for input(s): "DDIMER" in the last 72 hours. Hemoglobin A1C: No results for input(s): "HGBA1C" in the last 72 hours. Fasting Lipid Panel: No results for input(s): "CHOL", "HDL", "LDLCALC", "TRIG", "CHOLHDL", "LDLDIRECT" in the last 72 hours. Thyroid Function Tests: No results for input(s): "TSH", "T4TOTAL", "T3FREE", "THYROIDAB" in the last 72 hours.  Invalid input(s): "FREET3" Anemia Panel: No results for input(s): "VITAMINB12", "FOLATE", "FERRITIN", "TIBC", "  IRON", "RETICCTPCT" in the last 72 hours.  US RENAL  Result Date: 12/05/2022 CLINICAL DATA:  Acute renal failure, proteinuria and chronic kidney disease. EXAM: RENAL / URINARY TRACT ULTRASOUND COMPLETE COMPARISON:  None Available. FINDINGS: Right Kidney: Renal measurements: 9.2 x 5.3 x 5.2 cm = volume: 124 mL. Echogenicity within normal limits. No mass or hydronephrosis visualized. Left Kidney: Renal measurements: 9.0 x 5.6 x 5.2 cm = volume: 136 mL. Echogenicity within  normal limits. No mass or hydronephrosis visualized. Bladder: Appears normal for degree of bladder distention. Other: None. IMPRESSION: Unremarkable ultrasound of the kidneys and bladder. Electronically Signed   By: [name] M.D.   On: 12/05/2022 11:56     Echo severely reduced left ventricular function, LVEF less than 20%, moderate mitral regurgitation 11/29/2022  TELEMETRY: Sinus rhythm 60s to 70s with artifact, no further sustained VT or frequent PVCs  ASSESSMENT AND PLAN:  Principal Problem:   Cardiogenic shock (HCC) Active Problems:   PAD (peripheral artery disease) (HCC)   CAD (coronary artery disease)   DM (diabetes mellitus) (HCC)   Acute on chronic systolic CHF (congestive heart failure) (HCC)   Ventricular tachycardia (HCC)   Liver cirrhosis (HCC)   AKI (acute kidney injury) (HCC)    1.  Acute on chronic HFrEF, EF less than 20% 11/29/2022, right heart cath revealed PCWP 31 mmHg, a 77/33, mean 50 mmHg, cardiac output 4.02, cardiac index 2.01, on dopamine to maintain MAP 65 better, furosemide drip transitioned to torsemide 40 mg twice daily with overall good diuresis, good medical management limited by low blood pressure 2.  CAD, CABG x 3 (LIMA-LAD, SVG-OM2 and PDA 12/09/2013), no chest pain 3.  Sustained VT, 39 seconds, received 1 g IV load amiodarone, addition to 400 mg p.o. twice daily for 10 days, then 200 mg daily to be started 12/11/2022 4.  CKD stage IIIb, BUN and creatinine 92/2.66 and GFR 26  Recommendations   1.  Agree with current therapy 2.  Discussed with nephrology, will hold Torsemide 40 mg today and resume tomorrow 3.  Hold further Metolazone 5 mg daily  4.  Continue amiodarone 400 mg twice daily, transition to 200 mg daily 12/11/2022 5.  Defer resuming Carvedilol 3.125 mg twice daily as BP is low normal 6.  Will require advanced heart failure care, possible LVAD in the near future. He has follow up appt on Tuesday 5/21 at 1:30pm with Dr. Cheral [name] [facility] HF).   7.  Ok for discharge cardiac perspective today   This patient's plan of care was discussed and created with Dr. Darrold [name] and he is in agreement.    Rebeca Allegra, PA-C 12/06/2022 3:26 PM

## 2022-12-06 NOTE — Progress Notes (Signed)
[facility]  ROUNDING NOTE   Subjective:   Daughter at bedside.  Patient states he is still working full time and wants to continue to do so.  Patient has not gotten torsemide since 5/12.   Objective:  Vital signs in last 24 hours:  Temp:  [97.3 F (36.3 C)-97.7 F (36.5 C)] 97.7 F (36.5 C) (05/14 1120) Pulse Rate:  [71-86] 74 (05/14 1622) Resp:  [14-20] 16 (05/14 1622) BP: (92-119)/(51-90) 119/65 (05/14 1622) SpO2:  [91 %-98 %] 93 % (05/14 1622)  Weight change:  Filed Weights   12/02/22 0500 12/03/22 0500 12/04/22 0500  Weight: 82.8 kg 83.1 kg 83.2 kg    Intake/Output: I/O last 3 completed shifts: In: 240 [P.O.:240] Out: -    Intake/Output this shift:  No intake/output data recorded.  Physical Exam: General: NAD,   Head: Normocephalic, atraumatic. Moist oral mucosal membranes  Eyes: Anicteric, PERRL  Neck: Supple, trachea midline  Lungs:  Clear to auscultation  Heart: Regular rate and rhythm  Abdomen:  Soft, nontender,   Extremities:  + peripheral edema.  Neurologic: Nonfocal, moving all four extremities  Skin: No lesions        Basic Metabolic Panel: Recent Labs  Lab 11/30/22 0512 12/01/22 0647 12/02/22 0548 12/03/22 0524 12/03/22 1752 12/04/22 0341 12/05/22 0430 12/06/22 0354  NA 134* 130* 132* 130* 128* 128* 127* 129*  K 3.9 3.6 4.1 4.2 4.6 4.9 4.9 4.8  CL 92* 89* 89* 88* 87* 85* 85* 85*  CO2 30 24 29 30 28 26 26 28   GLUCOSE 131* 181* 86 113* 148* 104* 166* 120*  BUN 54* 52* 54* 65* 70* 70* 83* 92*  CREATININE 1.30* 1.35* 1.55* 1.95* 2.26* 2.30* 2.44* 2.66*  CALCIUM 8.9 8.9 8.9 8.9 8.8* 8.9 9.0 8.9  MG 2.7* 2.3 2.3 2.3  --  2.3  --   --   PHOS 3.6  --   --   --   --   --   --   --     Liver Function Tests: Recent Labs  Lab 11/30/22 0512  AST 33  ALT 125*  ALKPHOS 69  BILITOT 1.5*  PROT 7.6  ALBUMIN 3.5   No results for input(s): "LIPASE", "AMYLASE" in the last 168 hours. No results for input(s): "AMMONIA" in the  last 168 hours.  CBC: Recent Labs  Lab 11/30/22 0512 12/05/22 0430  WBC 5.8 5.9  NEUTROABS  --  3.9  HGB 12.0* 12.0*  HCT 37.9* 37.4*  MCV 94.8 92.6  PLT 216 231    Cardiac Enzymes: No results for input(s): "CKTOTAL", "CKMB", "CKMBINDEX", "TROPONINI" in the last 168 hours.  BNP: Invalid input(s): "POCBNP"  CBG: Recent Labs  Lab 12/05/22 1548 12/05/22 2058 12/06/22 0736 12/06/22 1218 12/06/22 1615  GLUCAP 94 109* 121* 241* 233*    Microbiology: Results for orders placed or performed during the hospital encounter of 11/28/22  MRSA Next Gen by PCR, Nasal     Status: None   Collection Time: 11/29/22  1:06 AM   Specimen: Nasal Mucosa; Nasal Swab  Result Value Ref Range Status   MRSA by PCR Next Gen NOT DETECTED NOT DETECTED Final    Comment: (NOTE) The GeneXpert MRSA Assay (FDA approved for NASAL specimens only), is one component of a comprehensive MRSA colonization surveillance program. It is not intended to diagnose MRSA infection nor to guide or monitor treatment for MRSA infections. Test performance is not FDA approved in patients less than 26 years old. Performed  at [facility], [address] [place], [place] [address]     Coagulation Studies: No results for input(s): "LABPROT", "INR" in the last 72 hours.  Urinalysis: Recent Labs    12/05/22 1515  COLORURINE YELLOW*  LABSPEC 1.015  PHURINE 5.0  GLUCOSEU NEGATIVE  HGBUR NEGATIVE  BILIRUBINUR NEGATIVE  KETONESUR TRACE*  PROTEINUR 100*  NITRITE NEGATIVE  LEUKOCYTESUR NEGATIVE      Imaging: US RENAL  Result Date: 12/05/2022 CLINICAL DATA:  Acute renal failure, proteinuria and chronic kidney disease. EXAM: RENAL / URINARY TRACT ULTRASOUND COMPLETE COMPARISON:  None Available. FINDINGS: Right Kidney: Renal measurements: 9.2 x 5.3 x 5.2 cm = volume: 124 mL. Echogenicity within normal limits. No mass or hydronephrosis visualized. Left Kidney: Renal measurements: 9.0 x 5.6 x 5.2 cm =  volume: 136 mL. Echogenicity within normal limits. No mass or hydronephrosis visualized. Bladder: Appears normal for degree of bladder distention. Other: None. IMPRESSION: Unremarkable ultrasound of the kidneys and bladder. Electronically Signed   By: Irish Lack M.D.   On: 12/05/2022 11:56     Medications:    sodium chloride      amiodarone  400 mg Oral BID   Followed by   Melene Muller ON 12/11/2022] amiodarone  200 mg Oral Daily   aspirin EC  81 mg Oral Daily   Chlorhexidine Gluconate Cloth  6 each Topical Daily   [START ON 12/07/2022] enoxaparin (LOVENOX) injection  30 mg Subcutaneous Q24H   insulin aspart  0-15 Units Subcutaneous TID AC & HS   multivitamin with minerals  1 tablet Oral Daily   oxyCODONE  10 mg Oral Q6H   pravastatin  40 mg Oral QODAY   Ensure Max Protein  11 oz Oral Daily   sodium chloride flush  3 mL Intravenous Q12H   sodium chloride flush  3 mL Intravenous Q12H   [START ON 12/07/2022] torsemide  40 mg Oral Daily   sodium chloride, acetaminophen, albuterol, bisacodyl, docusate sodium, HYDROcodone-acetaminophen, ondansetron (ZOFRAN) IV, polyethylene glycol, sodium chloride, sodium chloride flush  Assessment/ Plan:  Mr. TROTTER REMSON is a 62 y.o.  male with systolic congestive heart failure, ILD/COPD, coronary artery disease, diabetes mellitus type II, fibromyalgia, and peripheral vascular disease , who was admitted to [facility] on 11/28/2022 for Cardiogenic shock (HCC) [R57.0] Acute pulmonary edema (HCC) [J81.0] SOB (shortness of breath) [R06.02] Acute on chronic congestive heart failure, unspecified heart failure type (HCC) [I50.9] Acute hypoxic respiratory failure (HCC) [J96.01]   Acute kidney injury on chronic kidney disease stage IIIB with proteinuria: baseline creatinine of 1.5, GFR of 52 however suspect kidney function might be worse due to patient's poor muscle mass. Holding all diuretics. Check serum albumin  Acute exacerbation of chronic systolic and diastolic  congestive heart failure and hypertension: Appreciate cardiology input.   Hyponatremia: with hypervolemia: secondary to congestive heart failure and hepatic cirrhosis.  - fluid restriction.  - holding metolazone. - not a candidate for renal replacement therapy.    LOS: 8 Monia Timmers 5/14/20247:33 PM

## 2022-12-06 NOTE — Progress Notes (Signed)
Progress Note    [name]  WUJ:[id number] DOB: 10-21-1960  DOA: 11/28/2022 PCP: Barbette Reichmann, MD      Brief Narrative:    Medical records reviewed and are as summarized below:  [name] is a 62 y.o. male PMH of HFrEF (20%), s/p primary prevention ICD 07/2022, CAD s/p CABG x 3 (LIMA-LAD, SVG-OM2, SVG-PDA 12/09/2013), ILD/chronic respiratory failure (2L), DM2, who presented to the hospital with shortness of breath, chest tightness and decreased appetite.        Assessment/Plan:   Principal Problem:   Cardiogenic shock (HCC) Active Problems:   PAD (peripheral artery disease) (HCC)   CAD (coronary artery disease)   DM (diabetes mellitus) (HCC)   Acute on chronic systolic CHF (congestive heart failure) (HCC)   Ventricular tachycardia (HCC)   Liver cirrhosis (HCC)   AKI (acute kidney injury) (HCC)   Body mass index is 28.73 kg/m.   Cardiogenic shock: Hypotension has improved.  He has been off of dopamine drip since 12/03/2022.   Acute on chronic systolic CHF, s/p AICD: Cardiologist has reordered torsemide.  Monitor BMP, daily weight and urine output.  2D echo showed EF estimated at 20%. S/p right heart cath on 12/09/2022 which showed pulmonary hypertension and elevated pulmonary capillary wedge consistent with diastolic CHF..  Follow-up with cardiologist for further recommendations.   Ventricular tachycardia: Continue amiodarone.   CAD s/p CABG: Continue aspirin   Type II DM with hyperglycemia: NovoLog as needed for hyperglycemia.   Interstitial lung disease, chronic hypoxic respiratory failure: Continue 2 L/min oxygen via [place] as needed.  His wife said ILD was attributed to COVID-19 infection   AKI on CKD stage IIIa: Worsening AKI.  Creatinine still trending upward.  Torsemide has been held.  Follow-up with nephrologist for further recommendations.  Baseline creatinine is around 1.5.   Liver cirrhosis: Abdominal ultrasound showed liver  cirrhosis, cholelithiasis. Outpatient follow-up with gastroenterologist recommended.   Chronic hyponatremia: Likely from liver disease and CHF   Recommended palliative care consult.  However, patient declined.   Diet Order             Diet 2 gram sodium Room service appropriate? Yes; Fluid consistency: Thin; Fluid restriction: 1200 mL Fluid  Diet effective now                            Consultants: Cardiologist Intensivist  Procedures: S/p right heart cath    Medications:    amiodarone  400 mg Oral BID   Followed by   Melene Muller ON 12/11/2022] amiodarone  200 mg Oral Daily   aspirin EC  81 mg Oral Daily   Chlorhexidine Gluconate Cloth  6 each Topical Daily   enoxaparin (LOVENOX) injection  40 mg Subcutaneous Q24H   insulin aspart  0-15 Units Subcutaneous TID AC & HS   multivitamin with minerals  1 tablet Oral Daily   oxyCODONE  10 mg Oral Q6H   pravastatin  40 mg Oral QODAY   Ensure Max Protein  11 oz Oral Daily   sodium chloride flush  3 mL Intravenous Q12H   sodium chloride flush  3 mL Intravenous Q12H   [START ON 12/07/2022] torsemide  40 mg Oral Daily   Continuous Infusions:  sodium chloride       Anti-infectives (From admission, onward)    None              Family Communication/Anticipated D/C date and plan/Code  Status   DVT prophylaxis: Place TED hose Start: 12/01/22 1057 enoxaparin (LOVENOX) injection 40 mg Start: 11/29/22 0800 SCDs Start: 11/28/22 2259     Code Status: Full Code  Family Communication: Plan discussed with his daughter at the bedside Disposition Plan: Plan to discharge home in 1 to 2 days   Status is: Inpatient Remains inpatient appropriate because: CHF exacerbation       Subjective:   Interval events noted.  He reports adequate urine output.  No shortness of breath or chest pain.  He still has swelling in the legs.  Objective:    Vitals:   12/06/22 0232 12/06/22 0501 12/06/22 0737 12/06/22 1120   BP: 92/68 98/73 (!) 101/51 (!) 114/90  Pulse: 71 80 73 78  Resp: 20 18 14 14   Temp: (!) 97.4 F (36.3 C) 97.6 F (36.4 C) (!) 97.5 F (36.4 C) 97.7 F (36.5 C)  TempSrc:   Oral Oral  SpO2: 91% 91% 95% 95%  Weight:      Height:       No data found.  No intake or output data in the 24 hours ending 12/06/22 1441  Filed Weights   12/02/22 0500 12/03/22 0500 12/04/22 0500  Weight: 82.8 kg 83.1 kg 83.2 kg    Exam:  GEN: NAD SKIN: Chronic rash on the back EYES: Anicteric, no pallor ENT: MMM CV: RRR PULM: CTA B ABD: soft, ND, NT, +BS CNS: AAO x 3, non focal EXT: No edema or tenderness     Data Reviewed:   I have personally reviewed following labs and imaging studies:  Labs: Labs show the following:   Basic Metabolic Panel: Recent Labs  Lab 11/30/22 0512 12/01/22 0647 12/02/22 0548 12/03/22 0524 12/03/22 1752 12/04/22 0341 12/05/22 0430 12/06/22 0354  NA 134* 130* 132* 130* 128* 128* 127* 129*  K 3.9 3.6 4.1 4.2 4.6 4.9 4.9 4.8  CL 92* 89* 89* 88* 87* 85* 85* 85*  CO2 30 24 29 30 28 26 26 28   GLUCOSE 131* 181* 86 113* 148* 104* 166* 120*  BUN 54* 52* 54* 65* 70* 70* 83* 92*  CREATININE 1.30* 1.35* 1.55* 1.95* 2.26* 2.30* 2.44* 2.66*  CALCIUM 8.9 8.9 8.9 8.9 8.8* 8.9 9.0 8.9  MG 2.7* 2.3 2.3 2.3  --  2.3  --   --   PHOS 3.6  --   --   --   --   --   --   --    GFR Estimated Creatinine Clearance: 29.3 mL/min (A) (by C-G formula based on SCr of 2.66 mg/dL (H)). Liver Function Tests: Recent Labs  Lab 11/30/22 0512  AST 33  ALT 125*  ALKPHOS 69  BILITOT 1.5*  PROT 7.6  ALBUMIN 3.5   No results for input(s): "LIPASE", "AMYLASE" in the last 168 hours. No results for input(s): "AMMONIA" in the last 168 hours. Coagulation profile No results for input(s): "INR", "PROTIME" in the last 168 hours.  CBC: Recent Labs  Lab 11/29/22 1602 11/29/22 1607 11/30/22 0512 12/05/22 0430  WBC  --   --  5.8 5.9  NEUTROABS  --   --   --  3.9  HGB 12.6* 12.9*  12.0* 12.0*  HCT 37.0* 38.0* 37.9* 37.4*  MCV  --   --  94.8 92.6  PLT  --   --  216 231   Cardiac Enzymes: No results for input(s): "CKTOTAL", "CKMB", "CKMBINDEX", "TROPONINI" in the last 168 hours. BNP (last 3 results) No results for input(s): "  PROBNP" in the last 8760 hours. CBG: Recent Labs  Lab 12/07/22 1214 12/07/2022 1548 07-Dec-2022 2058 12/06/22 0736 12/06/22 1218  GLUCAP 179* 94 109* 121* 241*   D-Dimer: No results for input(s): "DDIMER" in the last 72 hours. Hgb A1c: No results for input(s): "HGBA1C" in the last 72 hours.  Lipid Profile: No results for input(s): "CHOL", "HDL", "LDLCALC", "TRIG", "CHOLHDL", "LDLDIRECT" in the last 72 hours. Thyroid function studies: No results for input(s): "TSH", "T4TOTAL", "T3FREE", "THYROIDAB" in the last 72 hours.  Invalid input(s): "FREET3" Anemia work up: No results for input(s): "VITAMINB12", "FOLATE", "FERRITIN", "TIBC", "IRON", "RETICCTPCT" in the last 72 hours. Sepsis Labs: Recent Labs  Lab 11/30/22 0512 07-Dec-2022 0430  WBC 5.8 5.9    Microbiology Recent Results (from the past 240 hour(s))  MRSA Next Gen by PCR, Nasal     Status: None   Collection Time: 11/29/22  1:06 AM   Specimen: Nasal Mucosa; Nasal Swab  Result Value Ref Range Status   MRSA by PCR Next Gen NOT DETECTED NOT DETECTED Final    Comment: (NOTE) The GeneXpert MRSA Assay (FDA approved for NASAL specimens only), is one component of a comprehensive MRSA colonization surveillance program. It is not intended to diagnose MRSA infection nor to guide or monitor treatment for MRSA infections. Test performance is not FDA approved in patients less than 66 years old. Performed at [facility], [address] [place], [place] [address]     Procedures and diagnostic studies:  US RENAL  Result Date: 12-07-2022 CLINICAL DATA:  Acute renal failure, proteinuria and chronic kidney disease. EXAM: RENAL / URINARY TRACT ULTRASOUND COMPLETE COMPARISON:   None Available. FINDINGS: Right Kidney: Renal measurements: 9.2 x 5.3 x 5.2 cm = volume: 124 mL. Echogenicity within normal limits. No mass or hydronephrosis visualized. Left Kidney: Renal measurements: 9.0 x 5.6 x 5.2 cm = volume: 136 mL. Echogenicity within normal limits. No mass or hydronephrosis visualized. Bladder: Appears normal for degree of bladder distention. Other: None. IMPRESSION: Unremarkable ultrasound of the kidneys and bladder. Electronically Signed   By: Irish Lack M.D.   On: 2022/12/07 11:56               LOS: 8 days   [name]  [facility]   Pager on [email or web address]. If 7PM-7AM, please contact night-coverage at [email or web address]     12/06/2022, 2:41 PM

## 2022-12-07 ENCOUNTER — Other Ambulatory Visit (INDEPENDENT_AMBULATORY_CARE_PROVIDER_SITE_OTHER): Payer: Self-pay | Admitting: Vascular Surgery

## 2022-12-07 DIAGNOSIS — Z9889 Other specified postprocedural states: Secondary | ICD-10-CM

## 2022-12-07 DIAGNOSIS — I472 Ventricular tachycardia, unspecified: Secondary | ICD-10-CM | POA: Diagnosis not present

## 2022-12-07 DIAGNOSIS — R570 Cardiogenic shock: Secondary | ICD-10-CM | POA: Diagnosis not present

## 2022-12-07 DIAGNOSIS — I6523 Occlusion and stenosis of bilateral carotid arteries: Secondary | ICD-10-CM

## 2022-12-07 DIAGNOSIS — I5023 Acute on chronic systolic (congestive) heart failure: Secondary | ICD-10-CM | POA: Diagnosis not present

## 2022-12-07 LAB — COMPREHENSIVE METABOLIC PANEL
ALT: 76 U/L — ABNORMAL HIGH (ref 0–44)
AST: 101 U/L — ABNORMAL HIGH (ref 15–41)
Albumin: 3.3 g/dL — ABNORMAL LOW (ref 3.5–5.0)
Alkaline Phosphatase: 70 U/L (ref 38–126)
Anion gap: 14 (ref 5–15)
BUN: 97 mg/dL — ABNORMAL HIGH (ref 8–23)
CO2: 28 mmol/L (ref 22–32)
Calcium: 9 mg/dL (ref 8.9–10.3)
Chloride: 87 mmol/L — ABNORMAL LOW (ref 98–111)
Creatinine, Ser: 2.56 mg/dL — ABNORMAL HIGH (ref 0.61–1.24)
GFR, Estimated: 27 mL/min — ABNORMAL LOW (ref >60)
Glucose, Bld: 112 mg/dL — ABNORMAL HIGH (ref 70–99)
Potassium: 4.4 mmol/L (ref 3.5–5.1)
Sodium: 129 mmol/L — ABNORMAL LOW (ref 135–145)
Total Bilirubin: 1.9 mg/dL — ABNORMAL HIGH (ref 0.3–1.2)
Total Protein: 7.9 g/dL (ref 6.5–8.1)

## 2022-12-07 LAB — GLUCOSE, CAPILLARY
Glucose-Capillary: 149 mg/dL — ABNORMAL HIGH (ref 70–99)
Glucose-Capillary: 213 mg/dL — ABNORMAL HIGH (ref 70–99)

## 2022-12-07 MED ORDER — TORSEMIDE 40 MG PO TABS: 40.0000 mg | ORAL_TABLET | Freq: Every day | ORAL | 0 refills | Status: AC

## 2022-12-07 MED ORDER — AMIODARONE HCL 400 MG PO TABS: 400.0000 mg | ORAL_TABLET | Freq: Two times a day (BID) | ORAL | 0 refills | Status: AC

## 2022-12-07 MED ORDER — AMIODARONE HCL 200 MG PO TABS: 200.0000 mg | ORAL_TABLET | Freq: Every day | ORAL | 0 refills | Status: AC

## 2022-12-07 NOTE — Progress Notes (Signed)
[facility]  Patient ID: [name] MRN: [id number] DOB/AGE: 1961/04/24 62 y.o.   Admit date: 11/28/2022 Referring Physician Eual Fines, NP  Primary Physician Dr. Marcello Fennel Primary Cardiologist Dr. Darrold Junker Reason for Consultation AoCHF   HPI: [name] ia a [phone] with a PMH of HFrEF (20%), s/p primary prevention ICD 07/2022, CAD s/p CABG x 3 (LIMA-LAD, SVG-OM2, SVG-PDA 12/09/2013), ILD/chronic respiratory failure (2L), DM2 who presented initially to [facility] clinic with 2 months of worsening dyspnea on exertion, ~13 pound weight gain in the past week and abdominal distention despite taking metolazone. He was referred directly to [facility] ED, where he was hypotensive to 70/30 and hypoxic, requiring levophed & 2L by [place]. Cardiology consulted for assistance with his CHF exacerbation.    Interval History:  - no acute events - Cr reaching plateau  - no chest pain or dyspnea - peripheral edema stable.  - eager to go home today    Vitals:   12/07/22 0055 12/07/22 0523 12/07/22 0752 12/07/22 1215  BP: 102/71 (!) 98/58 108/78 96/66  Pulse: 78 70 78 80  Resp: 18 18 18 18   Temp: (!) 97.5 F (36.4 C) (!) 97.5 F (36.4 C) (!) 97.5 F (36.4 C) (!) 97.5 F (36.4 C)  TempSrc:      SpO2: 95% 92% 100% 94%  Weight:   85.5 kg   Height:         Intake/Output Summary (Last 24 hours) at 12/07/2022 1327 Last data filed at 12/07/2022 1054 Gross per 24 hour  Intake 600 ml  Output --  Net 600 ml     PHYSICAL EXAM General: pleasant chronically ill appearing caucasian male, appears older than chronological age in no acute distress.  Sitting upright in bed without family present HEENT:  Normocephalic and atraumatic. Neck:   No JVD.  Lungs: Normal respiratory effort on room air Trace rhonchi in bases with decreased breath sounds in upper lung fields no wheezing.   Heart: Regular rate and rhythm normal S1 and S2 without gallops or murmurs.  Abdomen:  non distended appearing Msk:  Normal strength and tone for age. Extremities: Feet are cool to touch, chronic brawny hyperpigmentation trace 1-2+ bilateral LE edema  Neuro: Alert and oriented X 3. Psych:  Answers questions appropriately.    LABS: Basic Metabolic Panel: Recent Labs    12/06/22 0354 12/07/22 0539  NA 129* 129*  K 4.8 4.4  CL 85* 87*  CO2 28 28  GLUCOSE 120* 112*  BUN 92* 97*  CREATININE 2.66* 2.56*  CALCIUM 8.9 9.0    Liver Function Tests: Recent Labs    12/07/22 0539  AST 101*  ALT 76*  ALKPHOS 70  BILITOT 1.9*  PROT 7.9  ALBUMIN 3.3*   No results for input(s): "LIPASE", "AMYLASE" in the last 72 hours. CBC: Recent Labs    12/05/22 0430  WBC 5.9  NEUTROABS 3.9  HGB 12.0*  HCT 37.4*  MCV 92.6  PLT 231    Cardiac Enzymes: No results for input(s): "CKTOTAL", "CKMB", "CKMBINDEX", "TROPONINI" in the last 72 hours. BNP: Invalid input(s): "POCBNP" D-Dimer: No results for input(s): "DDIMER" in the last 72 hours. Hemoglobin A1C: No results for input(s): "HGBA1C" in the last 72 hours. Fasting Lipid Panel: No results for input(s): "CHOL", "HDL", "LDLCALC", "TRIG", "CHOLHDL", "LDLDIRECT" in the last 72 hours. Thyroid Function Tests: No results for input(s): "TSH", "T4TOTAL", "T3FREE", "THYROIDAB" in the last 72 hours.  Invalid input(s): "FREET3" Anemia Panel: No results for input(s): "VITAMINB12", "FOLATE", "  FERRITIN", "TIBC", "IRON", "RETICCTPCT" in the last 72 hours.  No results found.   Echo severely reduced left ventricular function, LVEF less than 20%, moderate mitral regurgitation 11/29/2022  TELEMETRY: Sinus rhythm 60s to 70s with artifact, ~40 sec of sustained WCT this AM (asymptomatic)   ASSESSMENT AND PLAN:  Principal Problem:   Cardiogenic shock (HCC) Active Problems:   PAD (peripheral artery disease) (HCC)   CAD (coronary artery disease)   DM (diabetes mellitus) (HCC)   Acute on chronic systolic CHF (congestive heart failure) (HCC)   Ventricular  tachycardia (HCC)   Liver cirrhosis (HCC)   AKI (acute kidney injury) (HCC)    1.  Acute on chronic HFrEF, EF less than 20% 11/29/2022, right heart cath revealed PCWP 31 mmHg, a 77/33, mean 50 mmHg, cardiac output 4.02, cardiac index 2.01, on dopamine to maintain MAP 65 better, furosemide drip transitioned to torsemide 40 mg twice daily with overall good diuresis, good medical management limited by low blood pressure 2.  CAD, CABG x 3 (LIMA-LAD, SVG-OM2 and PDA 12/09/2013), no chest pain 3.  Sustained VT, 39 seconds, received 1 g IV load amiodarone, addition to 400 mg p.o. twice daily for 10 days, then 200 mg daily to be started 12/11/2022 4.  CKD stage IIIb, BUN and creatinine 92/2.66 and GFR 26  Recommendations   1.  Agree with current therapy 2.  Restart Torsemide 40 mg daily  3.  Hold further Metolazone 5 mg daily  4.  Continue amiodarone 400 mg twice daily, transition to 200 mg daily 12/11/2022 5.  Defer resuming Carvedilol 3.125 mg twice daily as BP is low normal 6.  Will require advanced heart failure care, possible LVAD in the near future. He has follow up appt on Tuesday 5/21 at 1:30pm with Dr. Cheral Bay [facility]).  7.  Ok for discharge cardiac perspective today   This patient's plan of care was discussed and created with Dr. Darrold Junker and he is in agreement.    Rebeca Allegra, PA-C 12/07/2022 1:27 PM

## 2022-12-07 NOTE — Discharge Summary (Signed)
Physician Discharge Summary  KYPTON ARCIA ZOX:[id number] DOB: [birth year] DOA: 11/28/2022  PCP: Barbette Reichmann, MD  Admit date: 11/28/2022 Discharge date: 12/07/2022  Admitted From: home  Disposition:  home   Recommendations for Outpatient Follow-up:  Follow up with PCP in 1-2 weeks F/u w/ nephro, Dr. Wynelle Link, in 1-2 weeks F/u w/ cardio, Tuesday 5/21 at 1:30pm with Dr. Cheral Bay [facility] HF)   Home Health: no  Equipment/Devices:  Discharge Condition: stable  CODE STATUS: full  Diet recommendation: Heart Healthy / Carb Modified   Brief/Interim Summary: HPI was taken from NP [place]: They describe that since January 2024 when his defibrillator was placed he has been declining from a respiratory standpoint and has been requiring more and more chronic oxygen support at home.  He and his wife had COVID in February 2024 and since that time he has needed chronic 2 L nasal cannula 24 hours a day.  Over the past month he has noticed progressive dyspnea even with his oxygen support. He also describes fatigue, poor p.o. appetite with some nausea, and mild congestion.  He also reports a 13 pound weight gain this past week even though he has been taking his Lasix as prescribed twice a day and the last 4 days added metolazone as instructed by his cardiologist. He denies fever/chills, chest pain, abdominal pain/vomiting/diarrhea, urinary symptoms or productive cough.   ED course: Upon arrival patient alert and oriented but hypotensive and mildly hypoxic requiring 2 L chronic O2.  Labs significant for mild hyponatremia, hyperglycemia, elevated BNP & Transaminitis.  Creatinine elevated but does appear to be close to baseline from most recent labs in care everywhere.  Imaging revealed vascular congestion, interstitial edema and pleural effusions.  Blood pressure improved initially and patient was treated with oxygen and Lasix, however he became hypotensive again requiring low-dose Levophed drip  As per  Dr. Myriam Forehand: RODNIE MIDDENDORF is a 62 y.o. male PMH of HFrEF (20%), s/p primary prevention ICD 07/2022, CAD s/p CABG x 3 (LIMA-LAD, SVG-OM2, SVG-PDA 12/09/2013), ILD/chronic respiratory failure (2L), DM2, who presented to the hospital with shortness of breath, chest tightness and decreased appetite.   As per Dr. Mayford Knife 12/07/22: Pt was medically stable for d/c. Cardio & nephro cleared the pt for d/c. See previous progress/consult notes for more information.   Discharge Diagnoses:  Principal Problem:   Cardiogenic shock (HCC) Active Problems:   PAD (peripheral artery disease) (HCC)   CAD (coronary artery disease)   DM (diabetes mellitus) (HCC)   Acute on chronic systolic CHF (congestive heart failure) (HCC)   Ventricular tachycardia (HCC)   Liver cirrhosis (HCC)   AKI (acute kidney injury) (HCC)  Cardiogenic shock: hypotension has improved.  He has been off of dopamine drip since 12/03/2022.   Acute on chronic systolic CHF: s/p AICD. Continue on torsemide. Monitor I/Os. Ccho showed EF estimated at 20%. S/p right heart cath on 12/09/2022 which showed pulmonary hypertension and elevated pulmonary capillary wedge consistent with diastolic CHF. May need LVAD in near future as per cardio. F/u appt w/ cardio on  12/13/22 at 1:30pm w/ Dr. Edwena Blow    Ventricular tachycardia: continue amiodarone.  Hx of CAD s/p CABG: Continue aspirin   DM2: likely poorly controlled. Continue on SSI w/ accuchecks    Chronic hypoxic respiratory failure: w/ hx of interstitial lung disease. His wife said ILD was attributed to COVID-19 infection. Continue on supplemental oxygen, 2L [place]    AKI on CKDIIIa: baseline Cr 1.5. Cr is trending down slightly from  day prior. Nephro following and recs apprec    Liver cirrhosis: abdominal ultrasound showed liver cirrhosis, cholelithiasis. Outpatient follow-up with gastroenterologist recommended.   Chronic hyponatremia: likely secondary to cirrhosis   Discharge  Instructions  Discharge Instructions     AMB Referral to Cardiac Rehabilitation - Phase II   Complete by: As directed    Diagnosis: Heart Failure (see criteria below if ordering Phase II)   Heart Failure Type: Chronic Systolic   After initial evaluation and assessments completed: Virtual Based Care may be provided alone or in conjunction with Phase 2 Cardiac Rehab based on patient barriers.: Yes   Intensive Cardiac Rehabilitation (ICR) [facility] location only OR Traditional Cardiac Rehabilitation (TCR) *If criteria for ICR are not met will enroll in TCR [facility] only): Yes   Diet - low sodium heart healthy   Complete by: As directed    Discharge instructions   Complete by: As directed    F/u w/ cardio on Tuesday 5/21 at 1:30pm with Dr. Cheral [facility] HF). F/u w/ PCP in 1-2 weeks. F/u w/ nephro, Dr. Wynelle Link, in 1-2 weeks   Increase activity slowly   Complete by: As directed       Allergies as of 12/07/2022   No Known Allergies      Medication List     STOP taking these medications    carvedilol 3.125 MG tablet Commonly known as: COREG   furosemide 40 MG tablet Commonly known as: LASIX   metolazone 5 MG tablet Commonly known as: ZAROXOLYN   spironolactone 25 MG tablet Commonly known as: ALDACTONE       TAKE these medications    acetaminophen 500 MG tablet Commonly known as: TYLENOL Take 1 tablet (500 mg total) by mouth every 8 (eight) hours as needed for moderate pain or headache.   albuterol 108 (90 Base) MCG/ACT inhaler Commonly known as: VENTOLIN HFA Inhale 2 puffs into the lungs every 6 (six) hours as needed for wheezing or shortness of breath.   amiodarone 400 MG tablet Commonly known as: PACERONE Take 1 tablet (400 mg total) by mouth 2 (two) times daily for 3 days.   amiodarone 200 MG tablet Commonly known as: PACERONE Take 1 tablet (200 mg total) by mouth daily. Start taking this script on 12/11/22. Start taking on: Dec 11, 2022   aspirin EC 81 MG  tablet Take 81 mg by mouth daily.   Belbuca 450 MCG Film Generic drug: Buprenorphine HCl Place 1 Film (450 mcg total) inside cheek every 12 (twelve) hours.   Esbriet 267 MG Caps Generic drug: Pirfenidone Take 801 mg by mouth 2 (two) times daily.   HYDROcodone-acetaminophen 10-325 MG tablet Commonly known as: Norco Take 1 tablet by mouth every 8 (eight) hours as needed for severe pain. Must last 30 days. What changed: Another medication with the same name was removed. Continue taking this medication, and follow the directions you see here.   metFORMIN 500 MG tablet Commonly known as: GLUCOPHAGE Take 500 mg by mouth 2 (two) times daily with a meal.   pravastatin 40 MG tablet Commonly known as: PRAVACHOL Take 40 mg by mouth every other day.   sodium chloride 0.65 % nasal spray Commonly known as: OCEAN Place 1 spray into the nose as needed (dryness).   tamsulosin 0.4 MG Caps capsule Commonly known as: FLOMAX Take 0.4 mg by mouth every evening.   Torsemide 40 MG Tabs Take 40 mg by mouth daily. Start taking on: Dec 08, 2022  Follow-up Information     [facility] Go in 1 week(s).   Why: Follow up appointment with Dr. Cheral Bay (heart failure) at [facility] on Tuesday 5/21 at 1:30pm Contact information: [address] [place] [address] [phone]         Lamont Dowdy, MD Follow up.   Specialty: Nephrology Why: F/u in 1-2 weeks Contact information: [address] [place] [address] [phone]         Barbette Reichmann, MD Follow up.   Specialty: Internal Medicine Why: F/u in 1-2 weeks Contact information: [address] [place] [address] [phone]                No Known Allergies  Consultations: Nephro Cardio    Procedures/Studies: US RENAL  Result Date: 12/05/2022 CLINICAL DATA:  Acute renal failure, proteinuria and chronic  kidney disease. EXAM: RENAL / URINARY TRACT ULTRASOUND COMPLETE COMPARISON:  None Available. FINDINGS: Right Kidney: Renal measurements: 9.2 x 5.3 x 5.2 cm = volume: 124 mL. Echogenicity within normal limits. No mass or hydronephrosis visualized. Left Kidney: Renal measurements: 9.0 x 5.6 x 5.2 cm = volume: 136 mL. Echogenicity within normal limits. No mass or hydronephrosis visualized. Bladder: Appears normal for degree of bladder distention. Other: None. IMPRESSION: Unremarkable ultrasound of the kidneys and bladder. Electronically Signed   By: Irish Lack M.D.   On: 12/05/2022 11:56   CARDIAC CATHETERIZATION  Result Date: 11/29/2022   Hemodynamic findings consistent with severe pulmonary hypertension. 1.  Pulmonary hypertension with PA pressure 77/33 with pulmonary arterial mean 50 mmHg 2.  Elevated pulmonary capillary wedge 31 mmHg consistent with systolic congestive heart failure 3.  Cardiac output 4.02, cardiac index 2.01 Recommendations 1.  Continue diuresis 2.  Blood pressure support as needed   ECHOCARDIOGRAM COMPLETE  Result Date: 11/29/2022    ECHOCARDIOGRAM REPORT   Patient Name:   BRANN BARSCH Date of Exam: 11/29/2022 Medical Rec #:  [id number]       Height:       67.0 in Accession #:    [id number]      Weight:       195.1 lb Date of Birth:  [birth year]       BSA:          2.001 m Patient Age:    61 years        BP:           96/67 mmHg Patient Gender: M               HR:           93 bpm. Exam Location:  [facility] Procedure: 2D Echo, Cardiac Doppler, Color Doppler and Intracardiac            Opacification Agent Indications:     Dyspnea  History:         Patient has prior history of Echocardiogram examinations, most                  recent 09/10/2017. CHF, CAD, Prior CABG and Defibrillator, PAD                  and COPD; Risk Factors:Diabetes and Dyslipidemia. Shock.  Sonographer:     Mikki Harbor Referring Phys:  [id number] KHABIB DGAYLI Diagnosing Phys: Marcina Millard MD  Sonographer  Comments: Technically difficult study due to poor echo windows. Image acquisition challenging due to COPD. IMPRESSIONS  1.  Left ventricular ejection fraction, by estimation, is <20%. The left ventricle has severely decreased function. The left ventricle has no regional wall motion abnormalities. The left ventricular internal cavity size was severely dilated. Left ventricular diastolic parameters were normal.  2. Right ventricular systolic function is normal. The right ventricular size is normal. There is moderately elevated pulmonary artery systolic pressure.  3. The mitral valve is normal in structure. Moderate mitral valve regurgitation. No evidence of mitral stenosis.  4. Tricuspid valve regurgitation is mild to moderate.  5. The aortic valve is normal in structure. Aortic valve regurgitation is not visualized. No aortic stenosis is present.  6. The inferior vena cava is normal in size with greater than 50% respiratory variability, suggesting right atrial pressure of 3 mmHg. FINDINGS  Left Ventricle: Left ventricular ejection fraction, by estimation, is <20%. The left ventricle has severely decreased function. The left ventricle has no regional wall motion abnormalities. Definity contrast agent was given IV to delineate the left ventricular endocardial borders. The left ventricular internal cavity size was severely dilated. There is no left ventricular hypertrophy. Left ventricular diastolic parameters were normal. Right Ventricle: The right ventricular size is normal. No increase in right ventricular wall thickness. Right ventricular systolic function is normal. There is moderately elevated pulmonary artery systolic pressure. The tricuspid regurgitant velocity is 2.86 m/s, and with an assumed right atrial pressure of 15 mmHg, the estimated right ventricular systolic pressure is 47.7 mmHg. Left Atrium: Left atrial size was normal in size. Right Atrium: Right atrial size was normal in size. Pericardium: There is  no evidence of pericardial effusion. Mitral Valve: The mitral valve is normal in structure. Moderate mitral valve regurgitation. No evidence of mitral valve stenosis. MV peak gradient, 4.1 mmHg. The mean mitral valve gradient is 2.0 mmHg. Tricuspid Valve: The tricuspid valve is normal in structure. Tricuspid valve regurgitation is mild to moderate. No evidence of tricuspid stenosis. Aortic Valve: The aortic valve is normal in structure. Aortic valve regurgitation is not visualized. No aortic stenosis is present. Aortic valve mean gradient measures 2.0 mmHg. Aortic valve peak gradient measures 3.3 mmHg. Aortic valve area, by VTI measures 2.04 cm. Pulmonic Valve: The pulmonic valve was normal in structure. Pulmonic valve regurgitation is not visualized. No evidence of pulmonic stenosis. Aorta: The aortic root is normal in size and structure. Venous: The inferior vena cava is normal in size with greater than 50% respiratory variability, suggesting right atrial pressure of 3 mmHg. IAS/Shunts: No atrial level shunt detected by color flow Doppler.  LEFT VENTRICLE PLAX 2D LVIDd:         6.10 cm   Diastology LVIDs:         5.90 cm   LV e' medial:    5.26 cm/s LV PW:         1.00 cm   LV E/e' medial:  15.7 LV IVS:        1.00 cm   LV e' lateral:   8.59 cm/s LVOT diam:     2.00 cm   LV E/e' lateral: 9.6 LV SV:         34 LV SV Index:   17 LVOT Area:     3.14 cm  RIGHT VENTRICLE RV Basal diam:  3.85 cm RV Mid diam:    4.10 cm RV S prime:     4.73 cm/s TAPSE (M-mode): 1.0 cm LEFT ATRIUM           Index  RIGHT ATRIUM           Index LA diam:      4.50 cm 2.25 cm/m   RA Area:     17.30 cm LA Vol (A2C): 74.2 ml 37.08 ml/m  RA Volume:   48.50 ml  24.24 ml/m LA Vol (A4C): 76.6 ml 38.28 ml/m  AORTIC VALVE                    PULMONIC VALVE AV Area (Vmax):    2.53 cm     PV Vmax:       0.80 m/s AV Area (Vmean):   2.31 cm     PV Peak grad:  2.5 mmHg AV Area (VTI):     2.04 cm AV Vmax:           90.40 cm/s AV Vmean:           62.300 cm/s AV VTI:            0.166 m AV Peak Grad:      3.3 mmHg AV Mean Grad:      2.0 mmHg LVOT Vmax:         72.70 cm/s LVOT Vmean:        45.900 cm/s LVOT VTI:          0.108 m LVOT/AV VTI ratio: 0.65  AORTA Ao Root diam: 2.70 cm MITRAL VALVE               TRICUSPID VALVE MV Area (PHT): 5.58 cm    TR Peak grad:   32.7 mmHg MV Area VTI:   1.75 cm    TR Vmax:        286.00 cm/s MV Peak grad:  4.1 mmHg MV Mean grad:  2.0 mmHg    SHUNTS MV Vmax:       1.01 m/s    Systemic VTI:  0.11 m MV Vmean:      60.7 cm/s   Systemic Diam: 2.00 cm MV Decel Time: 136 msec MV E velocity: 82.80 cm/s MV A velocity: 43.50 cm/s MV E/A ratio:  1.90 Marcina Millard MD Electronically signed by Marcina Millard MD Signature Date/Time: 11/29/2022/1:03:23 PM    Final    US Abdomen Limited RUQ (LIVER/GB)  Result Date: 11/29/2022 CLINICAL DATA:  Transaminitis. EXAM: ULTRASOUND ABDOMEN LIMITED RIGHT UPPER QUADRANT COMPARISON:  None Available. FINDINGS: Gallbladder: Several gallstones evident measuring up to 6 mm. Gallbladder wall is mildly thickened at 3-4 mm. Sonographer reports no sonographic Murphy sign. Trace fluid is seen adjacent to the liver. Common bile duct: Diameter: 6 mm Liver: Echogenic parenchyma with nodular contour. No focal parenchymal mass lesion. No intrahepatic biliary duct dilatation. Portal vein is patent on color Doppler imaging with normal direction of blood flow towards the liver. Other: As above, there is trace perihepatic ascites. IMPRESSION: 1. Cholelithiasis with mild gallbladder wall thickening. Sonographer reports no sonographic Murphy sign. Gallbladder wall thickening can be nonspecific in the setting of liver disease. 2. Echogenic liver with nodular contour suggesting cirrhosis. 3. Trace perihepatic ascites. Electronically Signed   By: Kennith Center M.D.   On: 11/29/2022 09:41   DG Chest 2 View  Result Date: 11/28/2022 CLINICAL DATA:  Chest pain EXAM: CHEST - 2 VIEW COMPARISON:  X-ray  12/26/2020.  Report of a x-ray 11/08/2022 FINDINGS: Status post median sternotomy. Left upper chest defibrillator with leads along the right side of the enlarged heart. Increasing interstitial changes, possibly edema. Small pleural effusions. No pneumothorax. Overlapping cardiac  leads. Osteopenia. IMPRESSION: Status post median sternotomy. Enlarged heart with vascular congestion and interstitial edema. Tiny pleural effusions. Defibrillator.  Recommend follow-up Electronically Signed   By: Karen Kays M.D.   On: 11/28/2022 16:46   (Echo, Carotid, EGD, Colonoscopy, ERCP)    Subjective: Pt denies any complaints    Discharge Exam: Vitals:   12/07/22 0523 12/07/22 0752  BP: (!) 98/58 108/78  Pulse: 70 78  Resp: 18 18  Temp: (!) 97.5 F (36.4 C) (!) 97.5 F (36.4 C)  SpO2: 92% 100%   Vitals:   12/06/22 2052 12/07/22 0055 12/07/22 0523 12/07/22 0752  BP: 114/66 102/71 (!) 98/58 108/78  Pulse: 78 78 70 78  Resp: 18 18 18 18   Temp: 97.6 F (36.4 C) (!) 97.5 F (36.4 C) (!) 97.5 F (36.4 C) (!) 97.5 F (36.4 C)  TempSrc:      SpO2: 95% 95% 92% 100%  Weight:    85.5 kg  Height:        General: Pt is alert, awake, not in acute distress Cardiovascular: S1/S2 +, no rubs, no gallops Respiratory: decreased breath sounds b/l  Abdominal: Soft, NT, ND, bowel sounds + Extremities: b/l LE edema, no cyanosis    The results of significant diagnostics from this hospitalization (including imaging, microbiology, ancillary and laboratory) are listed below for reference.     Microbiology: Recent Results (from the past 240 hour(s))  MRSA Next Gen by PCR, Nasal     Status: None   Collection Time: 11/29/22  1:06 AM   Specimen: Nasal Mucosa; Nasal Swab  Result Value Ref Range Status   MRSA by PCR Next Gen NOT DETECTED NOT DETECTED Final    Comment: (NOTE) The GeneXpert MRSA Assay (FDA approved for NASAL specimens only), is one component of a comprehensive MRSA colonization  surveillance program. It is not intended to diagnose MRSA infection nor to guide or monitor treatment for MRSA infections. Test performance is not FDA approved in patients less than 89 years old. Performed at [facility], [address] [place], [place] [address]      Labs: BNP (last 3 results) Recent Labs    10/11/22 1226 11/28/22 1613  BNP 596.8* 1,280.6*   Basic Metabolic Panel: Recent Labs  Lab 12/01/22 0647 12/02/22 0548 12/03/22 0524 12/03/22 1752 12/04/22 0341 12/05/22 0430 12/06/22 0354 12/07/22 0539  NA 130* 132* 130* 128* 128* 127* 129* 129*  K 3.6 4.1 4.2 4.6 4.9 4.9 4.8 4.4  CL 89* 89* 88* 87* 85* 85* 85* 87*  CO2 24 29 30 28 26 26 28 28   GLUCOSE 181* 86 113* 148* 104* 166* 120* 112*  BUN 52* 54* 65* 70* 70* 83* 92* 97*  CREATININE 1.35* 1.55* 1.95* 2.26* 2.30* 2.44* 2.66* 2.56*  CALCIUM 8.9 8.9 8.9 8.8* 8.9 9.0 8.9 9.0  MG 2.3 2.3 2.3  --  2.3  --   --   --    Liver Function Tests: Recent Labs  Lab 12/07/22 0539  AST 101*  ALT 76*  ALKPHOS 70  BILITOT 1.9*  PROT 7.9  ALBUMIN 3.3*   No results for input(s): "LIPASE", "AMYLASE" in the last 168 hours. No results for input(s): "AMMONIA" in the last 168 hours. CBC: Recent Labs  Lab 12/05/22 0430  WBC 5.9  NEUTROABS 3.9  HGB 12.0*  HCT 37.4*  MCV 92.6  PLT 231   Cardiac Enzymes: No results for input(s): "CKTOTAL", "CKMB", "CKMBINDEX", "TROPONINI" in the last 168 hours. BNP: Invalid input(s): "POCBNP"  CBG: Recent Labs  Lab 12/06/22 0736 12/06/22 1218 12/06/22 1615 12/06/22 2053 12/07/22 0753  GLUCAP 121* 241* 233* 154* 149*   D-Dimer No results for input(s): "DDIMER" in the last 72 hours. Hgb A1c No results for input(s): "HGBA1C" in the last 72 hours. Lipid Profile No results for input(s): "CHOL", "HDL", "LDLCALC", "TRIG", "CHOLHDL", "LDLDIRECT" in the last 72 hours. Thyroid function studies No results for input(s): "TSH", "T4TOTAL", "T3FREE", "THYROIDAB" in the last  72 hours.  Invalid input(s): "FREET3" Anemia work up No results for input(s): "VITAMINB12", "FOLATE", "FERRITIN", "TIBC", "IRON", "RETICCTPCT" in the last 72 hours. Urinalysis    Component Value Date/Time   COLORURINE YELLOW (A) 12/05/2022 1515   APPEARANCEUR CLEAR (A) 12/05/2022 1515   LABSPEC 1.015 12/05/2022 1515   PHURINE 5.0 12/05/2022 1515   GLUCOSEU NEGATIVE 12/05/2022 1515   HGBUR NEGATIVE 12/05/2022 1515   BILIRUBINUR NEGATIVE 12/05/2022 1515   KETONESUR TRACE (A) 12/05/2022 1515   PROTEINUR 100 (A) 12/05/2022 1515   NITRITE NEGATIVE 12/05/2022 1515   LEUKOCYTESUR NEGATIVE 12/05/2022 1515   Sepsis Labs Recent Labs  Lab 12/05/22 0430  WBC 5.9   Microbiology Recent Results (from the past 240 hour(s))  MRSA Next Gen by PCR, Nasal     Status: None   Collection Time: 11/29/22  1:06 AM   Specimen: Nasal Mucosa; Nasal Swab  Result Value Ref Range Status   MRSA by PCR Next Gen NOT DETECTED NOT DETECTED Final    Comment: (NOTE) The GeneXpert MRSA Assay (FDA approved for NASAL specimens only), is one component of a comprehensive MRSA colonization surveillance program. It is not intended to diagnose MRSA infection nor to guide or monitor treatment for MRSA infections. Test performance is not FDA approved in patients less than 64 years old. Performed at [facility], [address], [place], [place] [address]      Time coordinating discharge: Over 30 minutes  SIGNED:   Charise Killian, MD  [facility] 12/07/2022, 12:00 PM Pager   If 7PM-7AM, please contact night-coverage [email or web address]

## 2022-12-07 NOTE — TOC Progression Note (Signed)
Transition of Care [facility]) - Progression Note    Patient Details  Name: [name] MRN: [id number] Date of Birth: 04/07/1961  [facility] CM/SW Contact  Truddie Hidden, RN Phone Number: 12/07/2022, 11:25 AM  Clinical Narrative:    Case reviewed for DME needs and changes in discharge disposition.         Expected Discharge Plan and Services                                               Social Determinants of Health (SDOH) Interventions SDOH Screenings   Food Insecurity: No Food Insecurity (12/03/2022)  Housing: Low Risk  (12/03/2022)  Transportation Needs: No Transportation Needs (12/03/2022)  Utilities: Not At Risk (12/03/2022)  Depression (PHQ2-9): Low Risk  (04/19/2022)  Financial Resource Strain: Low Risk  (01/28/2019)  Physical Activity: Sufficiently Active (01/28/2019)  Social Connections: Unknown (01/28/2019)  Stress: Stress Concern Present (01/28/2019)  Tobacco Use: Medium Risk (12/03/2022)    Readmission Risk Interventions     No data to display

## 2022-12-07 NOTE — Progress Notes (Signed)
[facility]  ROUNDING NOTE   Subjective:   Patient wants to go home.   States he is breathing well.   Objective:  Vital signs in last 24 hours:  Temp:  [97.5 F (36.4 C)-97.6 F (36.4 C)] 97.5 F (36.4 C) (05/15 1215) Pulse Rate:  [70-80] 80 (05/15 1215) Resp:  [16-18] 18 (05/15 1215) BP: (96-119)/(58-78) 96/66 (05/15 1215) SpO2:  [92 %-100 %] 94 % (05/15 1215) Weight:  [85.5 kg] 85.5 kg (05/15 0752)  Weight change:  Filed Weights   12/03/22 0500 12/04/22 0500 12/07/22 0752  Weight: 83.1 kg 83.2 kg 85.5 kg    Intake/Output: I/O last 3 completed shifts: In: 240 [P.O.:240] Out: -    Intake/Output this shift:  Total I/O In: 360 [P.O.:360] Out: -   Physical Exam: General: NAD,   Head: Normocephalic, atraumatic. Moist oral mucosal membranes  Eyes: Anicteric, PERRL  Neck: Supple, trachea midline  Lungs:  Clear to auscultation  Heart: Regular rate and rhythm  Abdomen:  Soft, nontender,   Extremities:  trace peripheral edema.  Neurologic: Nonfocal, moving all four extremities  Skin: No lesions        Basic Metabolic Panel: Recent Labs  Lab 12/01/22 0647 12/02/22 0548 12/03/22 0524 12/03/22 1752 12/04/22 0341 12/05/22 0430 12/06/22 0354 12/07/22 0539  NA 130* 132* 130* 128* 128* 127* 129* 129*  K 3.6 4.1 4.2 4.6 4.9 4.9 4.8 4.4  CL 89* 89* 88* 87* 85* 85* 85* 87*  CO2 24 29 30 28 26 26 28 28   GLUCOSE 181* 86 113* 148* 104* 166* 120* 112*  BUN 52* 54* 65* 70* 70* 83* 92* 97*  CREATININE 1.35* 1.55* 1.95* 2.26* 2.30* 2.44* 2.66* 2.56*  CALCIUM 8.9 8.9 8.9 8.8* 8.9 9.0 8.9 9.0  MG 2.3 2.3 2.3  --  2.3  --   --   --      Liver Function Tests: Recent Labs  Lab 12/07/22 0539  AST 101*  ALT 76*  ALKPHOS 70  BILITOT 1.9*  PROT 7.9  ALBUMIN 3.3*    No results for input(s): "LIPASE", "AMYLASE" in the last 168 hours. No results for input(s): "AMMONIA" in the last 168 hours.  CBC: Recent Labs  Lab 12/05/22 0430  WBC 5.9  NEUTROABS  3.9  HGB 12.0*  HCT 37.4*  MCV 92.6  PLT 231     Cardiac Enzymes: No results for input(s): "CKTOTAL", "CKMB", "CKMBINDEX", "TROPONINI" in the last 168 hours.  BNP: Invalid input(s): "POCBNP"  CBG: Recent Labs  Lab 12/06/22 1218 12/06/22 1615 12/06/22 2053 12/07/22 0753 12/07/22 1211  GLUCAP 241* 233* 154* 149* 213*     Microbiology: Results for orders placed or performed during the hospital encounter of 11/28/22  MRSA Next Gen by PCR, Nasal     Status: None   Collection Time: 11/29/22  1:06 AM   Specimen: Nasal Mucosa; Nasal Swab  Result Value Ref Range Status   MRSA by PCR Next Gen NOT DETECTED NOT DETECTED Final    Comment: (NOTE) The GeneXpert MRSA Assay (FDA approved for NASAL specimens only), is one component of a comprehensive MRSA colonization surveillance program. It is not intended to diagnose MRSA infection nor to guide or monitor treatment for MRSA infections. Test performance is not FDA approved in patients less than 72 years old. Performed at [facility], [address] [place], [place] [address]     Coagulation Studies: No results for input(s): "LABPROT", "INR" in the last 72 hours.  Urinalysis: Recent  Labs    12/05/22 1515  COLORURINE YELLOW*  LABSPEC 1.015  PHURINE 5.0  GLUCOSEU NEGATIVE  HGBUR NEGATIVE  BILIRUBINUR NEGATIVE  KETONESUR TRACE*  PROTEINUR 100*  NITRITE NEGATIVE  LEUKOCYTESUR NEGATIVE       Imaging: No results found.   Medications:    sodium chloride      amiodarone  400 mg Oral BID   Followed by   Melene Muller ON 12/11/2022] amiodarone  200 mg Oral Daily   aspirin EC  81 mg Oral Daily   Chlorhexidine Gluconate Cloth  6 each Topical Daily   enoxaparin (LOVENOX) injection  30 mg Subcutaneous Q24H   insulin aspart  0-15 Units Subcutaneous TID AC & HS   multivitamin with minerals  1 tablet Oral Daily   oxyCODONE  10 mg Oral Q6H   pravastatin  40 mg Oral QODAY   Ensure Max Protein  11 oz Oral Daily    sodium chloride flush  3 mL Intravenous Q12H   sodium chloride flush  3 mL Intravenous Q12H   torsemide  40 mg Oral Daily   sodium chloride, acetaminophen, albuterol, bisacodyl, docusate sodium, HYDROcodone-acetaminophen, ondansetron (ZOFRAN) IV, polyethylene glycol, sodium chloride, sodium chloride flush  Assessment/ Plan:  [name] is a 62 y.o.  male with systolic congestive heart failure, ILD/COPD, coronary artery disease, diabetes mellitus type II, fibromyalgia, and peripheral vascular disease , who was admitted to [facility] on 11/28/2022 for Cardiogenic shock (HCC) [R57.0] Acute pulmonary edema (HCC) [J81.0] SOB (shortness of breath) [R06.02] Acute on chronic congestive heart failure, unspecified heart failure type (HCC) [I50.9] Acute hypoxic respiratory failure (HCC) [J96.01]   Acute kidney injury on chronic kidney disease stage IIIB with proteinuria: baseline creatinine of 1.5, GFR of 52 however suspect kidney function might be worse due to patient's poor muscle mass. Discharge on torsemide.   Acute exacerbation of chronic systolic and diastolic congestive heart failure and hypertension: Appreciate cardiology input.   Hyponatremia: with hypervolemia: secondary to congestive heart failure and hepatic cirrhosis.  - fluid restriction.  - holding metolazone. - no indication for dialysis.   Needs close follow up with Nephrology.    LOS: 9 [name] 5/15/20241:06 PM

## 2022-12-08 DIAGNOSIS — L281 Prurigo nodularis: Secondary | ICD-10-CM | POA: Diagnosis not present

## 2022-12-08 LAB — PROTEIN ELECTROPHORESIS, SERUM
A/G Ratio: 0.8 (ref 0.7–1.7)
Albumin ELP: 3.1 g/dL (ref 2.9–4.4)
Alpha-1-Globulin: 0.4 g/dL (ref 0.0–0.4)
Alpha-2-Globulin: 0.9 g/dL (ref 0.4–1.0)
Beta Globulin: 1.1 g/dL (ref 0.7–1.3)
Gamma Globulin: 1.8 g/dL (ref 0.4–1.8)
Globulin, Total: 4.1 g/dL — ABNORMAL HIGH (ref 2.2–3.9)
Total Protein ELP: 7.2 g/dL (ref 6.0–8.5)

## 2022-12-12 ENCOUNTER — Ambulatory Visit (INDEPENDENT_AMBULATORY_CARE_PROVIDER_SITE_OTHER): Payer: Medicare HMO | Admitting: Vascular Surgery

## 2022-12-12 ENCOUNTER — Encounter (INDEPENDENT_AMBULATORY_CARE_PROVIDER_SITE_OTHER): Payer: Medicare HMO

## 2022-12-12 DIAGNOSIS — Z95810 Presence of automatic (implantable) cardiac defibrillator: Secondary | ICD-10-CM | POA: Diagnosis not present

## 2022-12-12 DIAGNOSIS — I519 Heart disease, unspecified: Secondary | ICD-10-CM | POA: Diagnosis not present

## 2022-12-12 DIAGNOSIS — Z951 Presence of aortocoronary bypass graft: Secondary | ICD-10-CM | POA: Diagnosis not present

## 2022-12-12 DIAGNOSIS — I5020 Unspecified systolic (congestive) heart failure: Secondary | ICD-10-CM | POA: Diagnosis not present

## 2022-12-13 DIAGNOSIS — I739 Peripheral vascular disease, unspecified: Secondary | ICD-10-CM | POA: Diagnosis not present

## 2022-12-13 DIAGNOSIS — N179 Acute kidney failure, unspecified: Secondary | ICD-10-CM | POA: Diagnosis not present

## 2022-12-13 DIAGNOSIS — Z951 Presence of aortocoronary bypass graft: Secondary | ICD-10-CM | POA: Diagnosis not present

## 2022-12-13 DIAGNOSIS — I509 Heart failure, unspecified: Secondary | ICD-10-CM | POA: Diagnosis not present

## 2022-12-13 DIAGNOSIS — R809 Proteinuria, unspecified: Secondary | ICD-10-CM | POA: Diagnosis not present

## 2022-12-13 DIAGNOSIS — N1832 Chronic kidney disease, stage 3b: Secondary | ICD-10-CM | POA: Diagnosis not present

## 2022-12-13 DIAGNOSIS — I5020 Unspecified systolic (congestive) heart failure: Secondary | ICD-10-CM | POA: Diagnosis not present

## 2022-12-13 DIAGNOSIS — E871 Hypo-osmolality and hyponatremia: Secondary | ICD-10-CM | POA: Diagnosis not present

## 2022-12-13 DIAGNOSIS — J449 Chronic obstructive pulmonary disease, unspecified: Secondary | ICD-10-CM | POA: Diagnosis not present

## 2022-12-13 DIAGNOSIS — I129 Hypertensive chronic kidney disease with stage 1 through stage 4 chronic kidney disease, or unspecified chronic kidney disease: Secondary | ICD-10-CM | POA: Diagnosis not present

## 2022-12-13 DIAGNOSIS — E1122 Type 2 diabetes mellitus with diabetic chronic kidney disease: Secondary | ICD-10-CM | POA: Diagnosis not present

## 2022-12-14 DIAGNOSIS — J84112 Idiopathic pulmonary fibrosis: Secondary | ICD-10-CM | POA: Diagnosis not present

## 2022-12-14 DIAGNOSIS — E1122 Type 2 diabetes mellitus with diabetic chronic kidney disease: Secondary | ICD-10-CM | POA: Diagnosis not present

## 2022-12-14 DIAGNOSIS — N1832 Chronic kidney disease, stage 3b: Secondary | ICD-10-CM | POA: Diagnosis not present

## 2022-12-14 DIAGNOSIS — I129 Hypertensive chronic kidney disease with stage 1 through stage 4 chronic kidney disease, or unspecified chronic kidney disease: Secondary | ICD-10-CM | POA: Diagnosis not present

## 2022-12-14 DIAGNOSIS — R809 Proteinuria, unspecified: Secondary | ICD-10-CM | POA: Diagnosis not present

## 2022-12-14 DIAGNOSIS — E1151 Type 2 diabetes mellitus with diabetic peripheral angiopathy without gangrene: Secondary | ICD-10-CM | POA: Diagnosis not present

## 2022-12-14 DIAGNOSIS — J9610 Chronic respiratory failure, unspecified whether with hypoxia or hypercapnia: Secondary | ICD-10-CM | POA: Diagnosis not present

## 2022-12-14 DIAGNOSIS — E1140 Type 2 diabetes mellitus with diabetic neuropathy, unspecified: Secondary | ICD-10-CM | POA: Diagnosis not present

## 2022-12-14 DIAGNOSIS — E871 Hypo-osmolality and hyponatremia: Secondary | ICD-10-CM | POA: Diagnosis not present

## 2022-12-14 DIAGNOSIS — J449 Chronic obstructive pulmonary disease, unspecified: Secondary | ICD-10-CM | POA: Diagnosis not present

## 2022-12-14 DIAGNOSIS — N179 Acute kidney failure, unspecified: Secondary | ICD-10-CM | POA: Diagnosis not present

## 2022-12-14 DIAGNOSIS — Z951 Presence of aortocoronary bypass graft: Secondary | ICD-10-CM | POA: Diagnosis not present

## 2022-12-14 DIAGNOSIS — I5020 Unspecified systolic (congestive) heart failure: Secondary | ICD-10-CM | POA: Diagnosis not present

## 2022-12-14 DIAGNOSIS — I509 Heart failure, unspecified: Secondary | ICD-10-CM | POA: Diagnosis not present

## 2022-12-14 DIAGNOSIS — R21 Rash and other nonspecific skin eruption: Secondary | ICD-10-CM | POA: Diagnosis not present

## 2022-12-14 DIAGNOSIS — Z09 Encounter for follow-up examination after completed treatment for conditions other than malignant neoplasm: Secondary | ICD-10-CM | POA: Diagnosis not present

## 2022-12-15 ENCOUNTER — Encounter: Payer: Self-pay | Admitting: Student in an Organized Health Care Education/Training Program

## 2022-12-15 ENCOUNTER — Ambulatory Visit
Payer: Medicare HMO | Attending: Student in an Organized Health Care Education/Training Program | Admitting: Student in an Organized Health Care Education/Training Program

## 2022-12-15 VITALS — BP 119/59 | HR 99 | Temp 97.2°F | Resp 16 | Ht 67.0 in | Wt 169.0 lb

## 2022-12-15 DIAGNOSIS — M25511 Pain in right shoulder: Secondary | ICD-10-CM | POA: Diagnosis not present

## 2022-12-15 DIAGNOSIS — I739 Peripheral vascular disease, unspecified: Secondary | ICD-10-CM | POA: Diagnosis not present

## 2022-12-15 DIAGNOSIS — G8929 Other chronic pain: Secondary | ICD-10-CM | POA: Diagnosis not present

## 2022-12-15 DIAGNOSIS — G894 Chronic pain syndrome: Secondary | ICD-10-CM

## 2022-12-15 DIAGNOSIS — I509 Heart failure, unspecified: Secondary | ICD-10-CM | POA: Diagnosis not present

## 2022-12-15 DIAGNOSIS — M19011 Primary osteoarthritis, right shoulder: Secondary | ICD-10-CM | POA: Diagnosis not present

## 2022-12-15 DIAGNOSIS — E1140 Type 2 diabetes mellitus with diabetic neuropathy, unspecified: Secondary | ICD-10-CM | POA: Diagnosis not present

## 2022-12-15 DIAGNOSIS — M25512 Pain in left shoulder: Secondary | ICD-10-CM

## 2022-12-15 MED ORDER — HYDROCODONE-ACETAMINOPHEN 10-325 MG PO TABS: 1.0000 | ORAL_TABLET | Freq: Three times a day (TID) | ORAL | 0 refills | Status: AC | PRN

## 2022-12-15 MED ORDER — BELBUCA 450 MCG BU FILM
1.0000 | ORAL_FILM | Freq: Two times a day (BID) | BUCCAL | 2 refills | Status: AC
Start: 2023-01-04 — End: 2023-04-04

## 2022-12-15 NOTE — Progress Notes (Signed)
PROVIDER NOTE: Information contained herein reflects review and annotations entered in association with encounter. Interpretation of such information and data should be left to medically-trained personnel. Information provided to patient can be located elsewhere in the medical record under "Patient Instructions". Document created using STT-dictation technology, any transcriptional errors that may result from process are unintentional.    Patient: [name]  Service Category: E/M  Provider: Edward Jolly, MD  DOB: [birth year]  DOS: 12/15/2022  Specialty: Interventional Pain Management  MRN: [id number]  Setting: Ambulatory outpatient  PCP: Barbette Reichmann, MD  Type: Established Patient    Referring Provider: Barbette Reichmann, MD  Location: Office  Delivery: Face-to-face     HPI  Mr. TEMILOLUWA SPURLOCK, a 62 y.o. year old male, is here today because of his Chronic painful diabetic neuropathy (HCC) [E11.40]. Mr. Gulino primary complain today is Foot Pain, Knee Pain (bilateral), and Shoulder Pain (right)  Last encounter: My last encounter with him was on 09/22/22   Pertinent problems: Mr. Bayles has PAD (peripheral artery disease) (HCC); Carotid stenosis; Carotid stenosis, symptomatic w/o infarct, left; CAD (coronary artery disease); Chronic right shoulder pain; S/P CABG (coronary artery bypass graft); DM (diabetes mellitus) (HCC); and Chronic pain syndrome on their pertinent problem list. Pain Assessment: Severity of Chronic pain is reported as a 7 /10. Location: Foot Right, Left/denies. Onset: More than a month ago. Quality: Aching, Numbness. Timing: Constant. Modifying factor(s): nothing. Vitals:  height is 5\' 7"  (1.702 m) and weight is 169 lb (76.7 kg). His temperature is 97.2 F (36.2 C) (abnormal). His blood pressure is 119/59 (abnormal) and his pulse is 99. His respiration is 16 and oxygen saturation is 92%.   Reason for encounter: medication management.    Since Mr. Raborn last visit with me,  he was hospitalized in May in the ICU with volume overload which improved with diuresis.  He was also experiencing hypoxic respiratory distress.  He is not on any oxygen.  He has an AICD in place.  He is considering LVAD placement for systolic congestive heart failure.  He states that his pain is well-managed on his current regimen.  He does find benefit with belbuca have ordered 450 mcg twice daily along with hydrocodone 10 mg every 8 hours as needed.  Pharmacotherapy Assessment  Analgesic: Belbuca 450 mcg BID, Norco 10 mg 3 times daily as needed, quantity 90/month MME equals 30   Monitoring: [place] PMP: PDMP reviewed during this encounter.       Pharmacotherapy: No side-effects or adverse reactions reported. Compliance: No problems identified. Effectiveness: Clinically acceptable.  UDS:  Summary  Date Value Ref Range Status  10/14/2021 Note  Final    Comment:    ==================================================================== [employer] [facility] (MW) ==================================================================== Test                             Result       Flag       Units  Drug Present and Declared for Prescription Verification   Hydrocodone                    3404         EXPECTED   ng/mg creat   Hydromorphone                  981          EXPECTED   ng/mg creat   Dihydrocodeine  405          EXPECTED   ng/mg creat   Norhydrocodone                 4328         EXPECTED   ng/mg creat    Sources of hydrocodone include scheduled prescription medications.    Hydromorphone, dihydrocodeine and norhydrocodone are expected    metabolites of hydrocodone. Hydromorphone and dihydrocodeine are    also available as scheduled prescription medications.  ==================================================================== Test                      Result    Flag   Units      Ref Range   Creatinine              57               mg/dL       >=29 ==================================================================== Declared Medications:  The flagging and interpretation on this report are based on the  following declared medications.  Unexpected results may arise from  inaccuracies in the declared medications.   **Note: The testing scope of this panel includes these medications:   Hydrocodone (Norco)   **Note: The testing scope of this panel does not include the  following reported medications:   Acetaminophen (Tylenol)  Acetaminophen (Norco)  Albuterol (Ventolin HFA)  Aspirin  Bupropion (Wellbutrin)  Carvedilol (Coreg)  Furosemide (Lasix)  Metformin (Glucophage)  Pirfenidone (Esbriet)  Pravastatin (Pravachol)  Sodium Chloride  Spironolactone (Aldactone)  Tamsulosin (Flomax) ==================================================================== For clinical consultation, please call [phone]. ====================================================================       ROS  Constitutional: Denies any fever or chills Gastrointestinal: No reported hemesis, hematochezia, vomiting, or acute GI distress Musculoskeletal:  right shoulder pain, + bilateral knee pain Neurological:  Paresthesias of bilateral feet  Medication Review  Buprenorphine HCl, HYDROcodone-acetaminophen, Pirfenidone, Torsemide, albuterol, amiodarone, aspirin EC, pravastatin, sodium chloride, and tamsulosin  History Review  Allergy: Mr. Ilgenfritz has No Known Allergies. Drug: Mr. Cancel  reports no history of drug use. Alcohol:  reports current alcohol use. Tobacco:  reports that he quit smoking about 9 years ago. His smoking use included cigarettes. He has quit using smokeless tobacco. Social: Mr. Brecker  reports that he quit smoking about 9 years ago. His smoking use included cigarettes. He has quit using smokeless tobacco. He reports current alcohol use. He reports that he does not use drugs. Medical:  has a past medical history of Cancer (HCC),  CHF (congestive heart failure) (HCC), COPD (chronic obstructive pulmonary disease) (HCC), Coronary artery disease, Coronary artery disease, Diabetes mellitus without complication (HCC), Dyspnea, Fibromyalgia, History of kidney stones, IPF (idiopathic pulmonary fibrosis) (HCC), MI (myocardial infarction) (HCC), Peripheral vascular disease (HCC), and Pneumonia. Surgical: Mr. Russin  has a past surgical history that includes Cardiac catheterization (N/A, 06/09/2015); Cardiac catheterization (06/09/2015); Coronary artery bypass graft (2015); Cardiac catheterization; Application if wound vac; Wound debridement; stent in leg (Left); Endarterectomy (Left, 09/08/2017); Lower Extremity Angiography (Right, 01/29/2019); Cardiac defibrillator placement (08/15/2022); and RIGHT HEART CATH (N/A, 11/29/2022). Family: family history includes Cancer in his father; Heart attack in his mother.  Laboratory Chemistry Profile   Renal Lab Results  Component Value Date   BUN 97 (H) 12/07/2022   CREATININE 2.56 (H) 12/07/2022   LABCREA 78 12/05/2022   GFRAA >60 01/29/2019   GFRNONAA 27 (L) 12/07/2022     Hepatic Lab Results  Component Value Date   AST 101 (H) 12/07/2022  ALT 76 (H) 12/07/2022   ALBUMIN 3.3 (L) 12/07/2022   ALKPHOS 70 12/07/2022     Electrolytes Lab Results  Component Value Date   NA 129 (L) 12/07/2022   K 4.4 12/07/2022   CL 87 (L) 12/07/2022   CALCIUM 9.0 12/07/2022   MG 2.3 12/04/2022   PHOS 3.6 11/30/2022     Bone No results found for: "VD25OH", "VD125OH2TOT", "ZO1096EA5", "WU9811BJ4", "25OHVITD1", "25OHVITD2", "25OHVITD3", "TESTOFREE", "TESTOSTERONE"   Inflammation (CRP: Acute Phase) (ESR: Chronic Phase) Lab Results  Component Value Date   LATICACIDVEN 1.4 09/09/2017       Note: Above Lab results reviewed.  Recent Imaging Review  US RENAL CLINICAL DATA:  Acute renal failure, proteinuria and chronic kidney disease.  EXAM: RENAL / URINARY TRACT ULTRASOUND  COMPLETE  COMPARISON:  None Available.  FINDINGS: Right Kidney:  Renal measurements: 9.2 x 5.3 x 5.2 cm = volume: 124 mL. Echogenicity within normal limits. No mass or hydronephrosis visualized.  Left Kidney:  Renal measurements: 9.0 x 5.6 x 5.2 cm = volume: 136 mL. Echogenicity within normal limits. No mass or hydronephrosis visualized.  Bladder:  Appears normal for degree of bladder distention.  Other:  None.  IMPRESSION: Unremarkable ultrasound of the kidneys and bladder.  Electronically Signed   By: Irish Lack M.D.   On: 12/05/2022 11:56  Note: Reviewed        Physical Exam  General appearance: Well nourished, well developed, and well hydrated. In no apparent acute distress Mental status: Alert, oriented x 3 (person, place, & time)       Respiratory: No evidence of acute respiratory distress Eyes: PERLA Vitals: BP (!) 119/59   Pulse 99   Temp (!) 97.2 F (36.2 C)   Resp 16   Ht 5\' 7"  (1.702 m)   Wt 169 lb (76.7 kg)   SpO2 92%   BMI 26.47 kg/m  BMI: Estimated body mass index is 26.47 kg/m as calculated from the following:   Height as of this encounter: 5\' 7"  (1.702 m).   Weight as of this encounter: 169 lb (76.7 kg). Ideal: Ideal body weight: 66.1 kg (145 lb 11.6 oz) Adjusted ideal body weight: 70.3 kg (155 lb 0.6 oz)    Cervical Spine Area Exam  Skin & Axial Inspection: No masses, redness, edema, swelling, or associated skin lesions Alignment: Symmetrical Functional ROM: Unrestricted ROM      Stability: No instability detected Muscle Tone/Strength: Functionally intact. No obvious neuro-muscular anomalies detected. Sensory (Neurological): Unimpaired     Lower Extremity Exam    Side: Right lower extremity  Side: Left lower extremity  Stability: No instability observed          Stability: No instability observed          Skin & Extremity Inspection: Skin color, temperature, and hair growth are WNL. No peripheral edema or cyanosis. No masses,  redness, swelling, asymmetry, or associated skin lesions. No contractures.  Skin & Extremity Inspection: Skin color, temperature, and hair growth are WNL. No peripheral edema or cyanosis. No masses, redness, swelling, asymmetry, or associated skin lesions. No contractures.  Functional ROM: Unrestricted ROM                  Functional ROM: Unrestricted ROM                  Muscle Tone/Strength: Functionally intact. No obvious neuro-muscular anomalies detected.  Muscle Tone/Strength: Functionally intact. No obvious neuro-muscular anomalies detected.  Sensory (Neurological): Neurogenic pain pattern  and arthropathic  Sensory (Neurological): Neurogenic pain pattern      and arthropathic  DTR: Patellar: deferred today Achilles: deferred today Plantar: deferred today  DTR: Patellar: deferred today Achilles: deferred today Plantar: deferred today  Palpation: No palpable anomalies  Palpation: No palpable anomalies    Assessment   Status Diagnosis  Persistent Persistent Persistent 1. Chronic painful diabetic neuropathy (HCC)   2. Chronic right shoulder pain   3. Primary osteoarthritis of right shoulder   4. Congestive heart failure, unspecified HF chronicity, unspecified heart failure type (HCC)   5. PAD (peripheral artery disease) (HCC)   6. Chronic pain of both shoulders   7. Chronic pain syndrome          Plan of Care    Mr. HENRI NODARSE has a current medication list which includes the following long-term medication(s): albuterol, amiodarone, pravastatin, sodium chloride, torsemide, amiodarone, [START ON 01/22/2023] hydrocodone-acetaminophen, [START ON 02/02/2023] hydrocodone-acetaminophen, and [START ON 03/04/2023] hydrocodone-acetaminophen.     Pharmacotherapy (Medications Ordered): Meds ordered this encounter  Medications   HYDROcodone-acetaminophen (NORCO) 10-325 MG tablet    Sig: Take 1 tablet by mouth every 8 (eight) hours as needed for severe pain. Must last 30 days.     Dispense:  90 tablet    Refill:  0    Chronic Pain. (STOP Act - Not applicable). Fill one day early if closed on scheduled refill date.   HYDROcodone-acetaminophen (NORCO) 10-325 MG tablet    Sig: Take 1 tablet by mouth every 8 (eight) hours as needed for severe pain. Must last 30 days.    Dispense:  90 tablet    Refill:  0    Chronic Pain. (STOP Act - Not applicable). Fill one day early if closed on scheduled refill date.   HYDROcodone-acetaminophen (NORCO) 10-325 MG tablet    Sig: Take 1 tablet by mouth every 8 (eight) hours as needed for severe pain. Must last 30 days.    Dispense:  90 tablet    Refill:  0    Chronic Pain. (STOP Act - Not applicable). Fill one day early if closed on scheduled refill date.   Buprenorphine HCl (BELBUCA) 450 MCG FILM    Sig: Place 1 Film (450 mcg total) inside cheek every 12 (twelve) hours.    Dispense:  60 each    Refill:  2     Patient has tried gabapentin, Lyrica, Cymbalta which resulted in side effects including nausea, cognitive changes which the patient did not like. Do not recommend chronic NSAID therapy in the context of his cardiovascular disease Avoid muscle relaxers given increased risk of sedation Optimize blood glucose management.  He has tried Qutenza for painful diabetic neuropathy.  Consider repeating.  Follow-up plan:   Return in about 15 weeks (around 03/30/2023) for Medication Management, in person.     s/p R GH shoulder joint injection 04/03/19, 08/26/19 (anterior approach), 03/23/2020- repeat prn; s/p right axillary nerve PNS         Recent Visits Date Type Provider Dept  09/22/22 Office Visit Edward Jolly, MD [facility]  Showing recent visits within past 90 days and meeting all other requirements Today's Visits Date Type Provider Dept  12/15/22 Office Visit Edward Jolly, MD [facility]  Showing today's visits and meeting all other requirements Future Appointments No visits were found meeting these  conditions. Showing future appointments within next 90 days and meeting all other requirements  I discussed the assessment and treatment plan with the patient. The  patient was provided an opportunity to ask questions and all were answered. The patient agreed with the plan and demonstrated an understanding of the instructions.  Patient advised to call back or seek an in-person evaluation if the symptoms or condition worsens.  Duration of encounter:68minutes.  Note by: Edward Jolly, MD Date: 12/15/2022; Time: 9:17 AM

## 2022-12-15 NOTE — Progress Notes (Signed)
Nursing Pain Medication Assessment:  Safety precautions to be maintained throughout the outpatient stay will include: orient to surroundings, keep bed in low position, maintain call bell within reach at all times, provide assistance with transfer out of bed and ambulation.  Medication Inspection Compliance: Pill count conducted under aseptic conditions, in front of the patient. Neither the pills nor the bottle was removed from the patient's sight at any time. Once count was completed pills were immediately returned to the patient in their original bottle.  Medication:  Belbuca Pill/Patch Count:  44 of 60 pills remain Pill/Patch Appearance: Markings consistent with prescribed medication Bottle Appearance: Standard pharmacy container. Clearly labeled. Filled Date: 03/ / 05 / 2024 Last Medication intake:  Today  Hydrocodone 60/90 Filled  11-26-2022 Last took today

## 2022-12-28 DIAGNOSIS — L280 Lichen simplex chronicus: Secondary | ICD-10-CM | POA: Diagnosis not present

## 2022-12-28 DIAGNOSIS — L309 Dermatitis, unspecified: Secondary | ICD-10-CM | POA: Diagnosis not present

## 2022-12-29 ENCOUNTER — Other Ambulatory Visit: Payer: Self-pay | Admitting: Student in an Organized Health Care Education/Training Program

## 2022-12-29 DIAGNOSIS — E1140 Type 2 diabetes mellitus with diabetic neuropathy, unspecified: Secondary | ICD-10-CM

## 2022-12-29 DIAGNOSIS — G894 Chronic pain syndrome: Secondary | ICD-10-CM

## 2022-12-29 DIAGNOSIS — I739 Peripheral vascular disease, unspecified: Secondary | ICD-10-CM

## 2022-12-29 DIAGNOSIS — G8929 Other chronic pain: Secondary | ICD-10-CM

## 2023-01-03 ENCOUNTER — Emergency Department
Admission: EM | Admit: 2023-01-03 | Discharge: 2023-01-23 | Disposition: E | Payer: Self-pay | Attending: Emergency Medicine | Admitting: Emergency Medicine

## 2023-01-03 DIAGNOSIS — I469 Cardiac arrest, cause unspecified: Secondary | ICD-10-CM | POA: Insufficient documentation

## 2023-01-03 DIAGNOSIS — R404 Transient alteration of awareness: Secondary | ICD-10-CM | POA: Diagnosis not present

## 2023-01-03 DIAGNOSIS — I499 Cardiac arrhythmia, unspecified: Secondary | ICD-10-CM | POA: Diagnosis not present

## 2023-01-03 DIAGNOSIS — R464 Slowness and poor responsiveness: Secondary | ICD-10-CM | POA: Insufficient documentation

## 2023-01-03 DIAGNOSIS — R0602 Shortness of breath: Secondary | ICD-10-CM | POA: Diagnosis not present

## 2023-01-03 DIAGNOSIS — R55 Syncope and collapse: Secondary | ICD-10-CM | POA: Diagnosis not present

## 2023-01-03 MED ORDER — SODIUM BICARBONATE 8.4 % IV SOLN
INTRAVENOUS | Status: AC | PRN
  Administered 2023-01-03: 50 meq via INTRAVENOUS

## 2023-01-03 MED ORDER — EPINEPHRINE 1 MG/10ML IJ SOSY
PREFILLED_SYRINGE | INTRAMUSCULAR | Status: AC | PRN
  Administered 2023-01-03: 1 mg via INTRAVENOUS

## 2023-01-03 MED FILL — Medication: Qty: 1 | Status: AC

## 2023-01-12 ENCOUNTER — Encounter (INDEPENDENT_AMBULATORY_CARE_PROVIDER_SITE_OTHER): Payer: Medicare HMO

## 2023-01-12 ENCOUNTER — Ambulatory Visit (INDEPENDENT_AMBULATORY_CARE_PROVIDER_SITE_OTHER): Payer: Medicare HMO | Admitting: Vascular Surgery

## 2023-01-23 NOTE — Code Documentation (Signed)
Pulse check. No pulse palpated. Asystole on the monitor. CPR continued.

## 2023-01-23 NOTE — Progress Notes (Signed)
Had been notified family had requested an ink pad to make finger palm prints as a remembrance. Ink pad from third floor brought and give to family. Also given a canvas and some cardstock to place finger prints on.

## 2023-01-23 NOTE — Code Documentation (Signed)
Pulse Check. No pulse palpated. Dr. Lenard Lance at bedside with [place], no cardiac activity noted. Pupils are fixed and dilated. CPR stopped at this time and TOD called.

## 2023-01-23 NOTE — Code Documentation (Signed)
Pulse Check. No pulse palpated. Asystole on the monitor. CPR continued.

## 2023-01-23 NOTE — Progress Notes (Signed)
  Chaplain On-Call responded to a page from ED Secretary Misty Stanley, who reported the death of the patient, and Dr. Awanda Mink request that the Chaplain come to be with the family.  Chaplain met 7 family members in the Family Consult Room, and offered time for life review and story-telling about the patient's life and joyous personality.  They described his difficult illnesses, and the deep meaning he brought to their lives despite these physical limitations. They spoke confidently about their faith, and their assurance for the patient's faith.  Chaplain provided much spiritual and emotional support and prayer.  Chaplain Morene Crocker., [facility]

## 2023-01-23 NOTE — ED Provider Notes (Addendum)
[facility] Provider Note    Event Date/Time   First MD Initiated Contact with Patient 12/27/2022 1155     (approximate)  History   Chief Complaint: Cardiac Arrest  HPI  [name] is a 62 y.o. male with a past medical history of coronary artery disease per EMS who presents to the emergency department in cardiac arrest.  According to EMS they were called out to the scene for decreased responsiveness/respiratory arrest.  Report states that the wife was driving when the patient became unresponsive she pulled over.  EMS arrived within approximately 10 minutes found the patient to be unresponsive with no palpable pulse and CPR was started.  EMS states they initiated CPR at 10:41 AM.  They ran the code for approximately 45 minutes to an hour in the field never had return of pulses but several times had PEA.  Continued with medications and CPR and transported the patient to the emergency department for evaluation.  Patient arrived with [facility] device in place providing compressions and an ET tube/intubation with a significant amount of pulmonary hemorrhage.  Physical Exam   Triage Vital Signs: ED Triage Vitals  Enc Vitals Group     BP      Pulse      Resp      Temp      Temp src      SpO2      Weight      Height      Head Circumference      Peak Flow      Pain Score      Pain Loc      Pain Edu?      Excl. in GC?     Most recent vital signs: There were no vitals filed for this visit.  General: Unresponsive, intubated, fixed and dilated pupils. CV:  [facility] device providing compressions, good pulse with compression. Resp:  Patient being ventilated through ET tube breath sounds auscultated bilaterally.  Significant amount of blood in the ET tube Abd:  No distention, or obvious signs of trauma/ecchymosis. Other:  Lower extremity IO in place.   MEDICATIONS ORDERED IN ED: Medications  EPINEPHrine (ADRENALIN) 1 MG/10ML injection (1 mg Intravenous Given  01/16/2023 1149)     IMPRESSION / MDM / ASSESSMENT AND PLAN / ED COURSE  I reviewed the triage vital signs and the nursing notes.  Patient's presentation is most consistent with acute presentation with potential threat to life or bodily function.  Patient arrived to the emergency department CPR in progress.  Upon arrival patient noted to have fixed and dilated pupils he is intubated with significant pulmonary hemorrhage appears to have chest rise and breath sounds bilaterally again.  Patient has an IO line in place.  Upon arrival left upper extremity IV obtained.  Continued with medications epinephrine and bicarbonate.  Pulse check/rhythm check shows asystole versus very slow bradycardia/PEA with no pulse.  CPR was continued in the emergency department for approximately 15 minutes.  At that time bedside ultrasound performed by myself showed essentially cardiac standstill very slight twitch of muscle activity from time to time but not organized and not perfusable.  At that time as the patient had fixed and dilated pupils with an hour and 15 minutes of CPR and essentially cardiac standstill by ultrasound time of death was called.  Awaiting family arrival to update on unfortunate result.  I spoke to the medical examiner.  Not an ME case per examiner.  I have updated  the family.  Cardiopulmonary Resuscitation (CPR) Procedure Note Directed/Performed by: Minna Antis I personally directed ancillary staff and/or performed CPR in an effort to regain return of spontaneous circulation and to maintain cardiac, neuro and systemic perfusion.   CRITICAL CARE Performed by: Minna Antis   Total critical care time: 20 minutes  Critical care time was exclusive of separately billable procedures and treating other patients.  Critical care was necessary to treat or prevent imminent or life-threatening deterioration.  Critical care was time spent personally by me on the following activities: development  of treatment plan with patient and/or surrogate as well as nursing, discussions with consultants, evaluation of patient's response to treatment, examination of patient, obtaining history from patient or surrogate, ordering and performing treatments and interventions, ordering and review of laboratory studies, ordering and review of radiographic studies, pulse oximetry and re-evaluation of patient's condition.   FINAL CLINICAL IMPRESSION(S) / ED DIAGNOSES   Cardiac arrest CPR, unsuccessful  Note:  This document was prepared using Dragon voice recognition software and may include unintentional dictation errors.   Minna Antis, MD Jan 10, 2023 1202    Minna Antis, MD 01/10/2023 1209

## 2023-01-23 NOTE — ED Triage Notes (Signed)
Pt via [facility] from side of the road. Pt CPR in progress, pt was on the way to his cardiologist appointment and went into cardiac arrest. CPR was initiated an had been ongoing for approx 45 mins PTA. EMS gave 4 EPI, 1 amp of Sodium Bicard, and 1 amp of Calcium. On arrival, pt on the [facility] and CPR ongoing.

## 2023-01-23 DEATH — deceased

## 2023-03-28 ENCOUNTER — Encounter: Payer: Medicare HMO | Admitting: Student in an Organized Health Care Education/Training Program
